# Patient Record
Sex: Male | Born: 1945 | Race: White | Hispanic: No | Marital: Married
Health system: Southern US, Academic
[De-identification: ages and names within clinical notes are randomized; demographics above are authoritative.]

## PROBLEM LIST (undated history)

## (undated) ENCOUNTER — Ambulatory Visit

## (undated) ENCOUNTER — Encounter

## (undated) ENCOUNTER — Encounter: Attending: Anatomic Pathology & Clinical Pathology | Primary: Anatomic Pathology & Clinical Pathology

## (undated) ENCOUNTER — Ambulatory Visit: Payer: MEDICARE

## (undated) ENCOUNTER — Encounter: Attending: Internal Medicine | Primary: Internal Medicine

## (undated) ENCOUNTER — Encounter: Attending: Hematology | Primary: Hematology

## (undated) ENCOUNTER — Encounter: Attending: Pharmacist | Primary: Pharmacist

## (undated) ENCOUNTER — Telehealth

## (undated) ENCOUNTER — Encounter: Attending: Nurse Practitioner | Primary: Nurse Practitioner

## (undated) ENCOUNTER — Telehealth
Attending: Student in an Organized Health Care Education/Training Program | Primary: Student in an Organized Health Care Education/Training Program

## (undated) ENCOUNTER — Telehealth: Attending: Physical Medicine & Rehabilitation | Primary: Physical Medicine & Rehabilitation

## (undated) ENCOUNTER — Encounter: Attending: Pediatrics | Primary: Pediatrics

## (undated) ENCOUNTER — Ambulatory Visit: Payer: MEDICARE | Attending: Primary Care | Primary: Primary Care

## (undated) ENCOUNTER — Ambulatory Visit: Payer: MEDICARE | Attending: Hematology | Primary: Hematology

## (undated) ENCOUNTER — Ambulatory Visit: Payer: Medicare (Managed Care)

## (undated) ENCOUNTER — Ambulatory Visit: Attending: Orthopaedic Surgery | Primary: Orthopaedic Surgery

## (undated) ENCOUNTER — Encounter: Attending: Oncology | Primary: Oncology

## (undated) ENCOUNTER — Encounter: Attending: Primary Care | Primary: Primary Care

## (undated) ENCOUNTER — Telehealth: Attending: Pediatrics | Primary: Pediatrics

## (undated) ENCOUNTER — Telehealth: Attending: Acute Care | Primary: Acute Care

## (undated) ENCOUNTER — Encounter: Attending: Podiatrist | Primary: Podiatrist

## (undated) ENCOUNTER — Telehealth: Attending: Internal Medicine | Primary: Internal Medicine

## (undated) ENCOUNTER — Encounter: Attending: Adult Health | Primary: Adult Health

## (undated) ENCOUNTER — Encounter
Attending: Student in an Organized Health Care Education/Training Program | Primary: Student in an Organized Health Care Education/Training Program

## (undated) ENCOUNTER — Encounter
Payer: MEDICARE | Attending: Adult Reconstructive Orthopaedic Surgery | Primary: Adult Reconstructive Orthopaedic Surgery

## (undated) ENCOUNTER — Encounter
Attending: Rehabilitative and Restorative Service Providers" | Primary: Rehabilitative and Restorative Service Providers"

## (undated) ENCOUNTER — Encounter: Attending: Critical Care Medicine | Primary: Critical Care Medicine

## (undated) ENCOUNTER — Ambulatory Visit: Payer: MEDICARE | Attending: Adult Health | Primary: Adult Health

## (undated) ENCOUNTER — Ambulatory Visit: Payer: MEDICARE | Attending: Internal Medicine | Primary: Internal Medicine

## (undated) ENCOUNTER — Telehealth: Attending: Oncology | Primary: Oncology

## (undated) ENCOUNTER — Encounter: Attending: Adult Reconstructive Orthopaedic Surgery | Primary: Adult Reconstructive Orthopaedic Surgery

## (undated) ENCOUNTER — Ambulatory Visit: Payer: Medicare (Managed Care) | Attending: Family | Primary: Family

## (undated) ENCOUNTER — Encounter
Payer: MEDICARE | Attending: Rehabilitative and Restorative Service Providers" | Primary: Rehabilitative and Restorative Service Providers"

## (undated) ENCOUNTER — Ambulatory Visit
Payer: Medicare (Managed Care) | Attending: Student in an Organized Health Care Education/Training Program | Primary: Student in an Organized Health Care Education/Training Program

## (undated) ENCOUNTER — Telehealth: Attending: Podiatrist | Primary: Podiatrist

## (undated) ENCOUNTER — Encounter: Attending: Infectious Disease | Primary: Infectious Disease

## (undated) ENCOUNTER — Encounter: Attending: Cardiovascular Disease | Primary: Cardiovascular Disease

## (undated) ENCOUNTER — Encounter: Payer: MEDICARE | Attending: Pediatrics | Primary: Pediatrics

## (undated) ENCOUNTER — Telehealth: Attending: Hematology & Oncology | Primary: Hematology & Oncology

## (undated) ENCOUNTER — Ambulatory Visit
Payer: MEDICARE | Attending: Rehabilitative and Restorative Service Providers" | Primary: Rehabilitative and Restorative Service Providers"

## (undated) ENCOUNTER — Encounter
Payer: MEDICARE | Attending: Student in an Organized Health Care Education/Training Program | Primary: Student in an Organized Health Care Education/Training Program

## (undated) ENCOUNTER — Ambulatory Visit
Attending: Rehabilitative and Restorative Service Providers" | Primary: Rehabilitative and Restorative Service Providers"

## (undated) ENCOUNTER — Encounter: Attending: Physical Medicine & Rehabilitation | Primary: Physical Medicine & Rehabilitation

## (undated) ENCOUNTER — Telehealth: Attending: Hematology | Primary: Hematology

## (undated) ENCOUNTER — Encounter: Payer: MEDICARE | Attending: Oncology | Primary: Oncology

## (undated) ENCOUNTER — Ambulatory Visit: Attending: Pediatrics | Primary: Pediatrics

## (undated) ENCOUNTER — Ambulatory Visit: Payer: MEDICARE | Attending: Physical Medicine & Rehabilitation | Primary: Physical Medicine & Rehabilitation

## (undated) ENCOUNTER — Ambulatory Visit: Payer: Medicare (Managed Care) | Attending: Internal Medicine | Primary: Internal Medicine

## (undated) ENCOUNTER — Encounter
Attending: Pharmacist Clinician (PhC)/ Clinical Pharmacy Specialist | Primary: Pharmacist Clinician (PhC)/ Clinical Pharmacy Specialist

## (undated) ENCOUNTER — Encounter: Attending: Family | Primary: Family

## (undated) ENCOUNTER — Ambulatory Visit: Payer: MEDICARE | Attending: Oncology | Primary: Oncology

## (undated) ENCOUNTER — Encounter: Attending: Otolaryngology | Primary: Otolaryngology

## (undated) ENCOUNTER — Ambulatory Visit: Payer: MEDICARE | Attending: Podiatrist | Primary: Podiatrist

## (undated) ENCOUNTER — Ambulatory Visit: Payer: Medicare (Managed Care) | Attending: Dermatology | Primary: Dermatology

## (undated) ENCOUNTER — Other Ambulatory Visit

## (undated) ENCOUNTER — Ambulatory Visit: Payer: MEDICARE | Attending: Cardiovascular Disease | Primary: Cardiovascular Disease

## (undated) DIAGNOSIS — C801 Malignant (primary) neoplasm, unspecified: Secondary | ICD-10-CM

## (undated) DIAGNOSIS — R7303 Prediabetes: Secondary | ICD-10-CM

## (undated) DIAGNOSIS — I10 Essential (primary) hypertension: Secondary | ICD-10-CM

## (undated) HISTORY — PX: HERNIA REPAIR: SHX51

## (undated) HISTORY — PX: OTHER SURGICAL HISTORY: SHX169

---
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
## 2008-04-27 DIAGNOSIS — K635 Polyp of colon: Secondary | ICD-10-CM | POA: Insufficient documentation

## 2012-11-27 HISTORY — PX: JOINT REPLACEMENT: SHX530

## 2015-05-18 DIAGNOSIS — R7301 Impaired fasting glucose: Secondary | ICD-10-CM | POA: Insufficient documentation

## 2015-05-18 DIAGNOSIS — I872 Venous insufficiency (chronic) (peripheral): Secondary | ICD-10-CM | POA: Insufficient documentation

## 2015-05-18 DIAGNOSIS — B002 Herpesviral gingivostomatitis and pharyngotonsillitis: Secondary | ICD-10-CM | POA: Insufficient documentation

## 2015-05-18 DIAGNOSIS — B351 Tinea unguium: Secondary | ICD-10-CM | POA: Insufficient documentation

## 2015-05-18 DIAGNOSIS — E559 Vitamin D deficiency, unspecified: Secondary | ICD-10-CM | POA: Insufficient documentation

## 2015-05-18 DIAGNOSIS — E785 Hyperlipidemia, unspecified: Secondary | ICD-10-CM | POA: Insufficient documentation

## 2015-05-18 DIAGNOSIS — E669 Obesity, unspecified: Secondary | ICD-10-CM | POA: Insufficient documentation

## 2015-07-19 DIAGNOSIS — N401 Enlarged prostate with lower urinary tract symptoms: Secondary | ICD-10-CM | POA: Insufficient documentation

## 2015-07-19 DIAGNOSIS — R350 Frequency of micturition: Secondary | ICD-10-CM | POA: Insufficient documentation

## 2015-10-13 DIAGNOSIS — Z96641 Presence of right artificial hip joint: Secondary | ICD-10-CM | POA: Insufficient documentation

## 2017-03-28 DIAGNOSIS — M25552 Pain in left hip: Secondary | ICD-10-CM | POA: Insufficient documentation

## 2017-03-28 DIAGNOSIS — R739 Hyperglycemia, unspecified: Secondary | ICD-10-CM | POA: Insufficient documentation

## 2017-03-28 DIAGNOSIS — R7303 Prediabetes: Secondary | ICD-10-CM | POA: Insufficient documentation

## 2017-05-31 ENCOUNTER — Ambulatory Visit: Admission: RE | Admit: 2017-05-31 | Discharge: 2017-05-31 | Payer: MEDICARE | Attending: Surgery | Admitting: Surgery

## 2017-05-31 DIAGNOSIS — Z8719 Personal history of other diseases of the digestive system: Secondary | ICD-10-CM

## 2017-05-31 DIAGNOSIS — Z9889 Other specified postprocedural states: Secondary | ICD-10-CM

## 2017-05-31 DIAGNOSIS — K4030 Unilateral inguinal hernia, with obstruction, without gangrene, not specified as recurrent: Principal | ICD-10-CM

## 2017-11-12 ENCOUNTER — Ambulatory Visit
Admission: RE | Admit: 2017-11-12 | Discharge: 2017-11-12 | Payer: MEDICARE | Attending: Pediatrics | Admitting: Pediatrics

## 2017-11-12 DIAGNOSIS — Z23 Encounter for immunization: Principal | ICD-10-CM

## 2017-11-12 DIAGNOSIS — D369 Benign neoplasm, unspecified site: Secondary | ICD-10-CM

## 2017-11-12 DIAGNOSIS — L821 Other seborrheic keratosis: Secondary | ICD-10-CM

## 2017-11-12 DIAGNOSIS — M545 Low back pain: Secondary | ICD-10-CM

## 2017-11-12 DIAGNOSIS — I781 Nevus, non-neoplastic: Secondary | ICD-10-CM

## 2018-01-01 ENCOUNTER — Encounter: Admit: 2018-01-01 | Discharge: 2018-01-01 | Payer: MEDICARE

## 2018-02-19 ENCOUNTER — Encounter: Admit: 2018-02-19 | Discharge: 2018-02-20 | Payer: MEDICARE | Attending: Pediatrics | Primary: Pediatrics

## 2018-02-19 DIAGNOSIS — R61 Generalized hyperhidrosis: Principal | ICD-10-CM

## 2018-02-19 DIAGNOSIS — F5101 Primary insomnia: Secondary | ICD-10-CM

## 2018-02-19 DIAGNOSIS — R634 Abnormal weight loss: Secondary | ICD-10-CM

## 2018-03-21 ENCOUNTER — Encounter: Admit: 2018-03-21 | Discharge: 2018-03-22 | Payer: MEDICARE | Attending: Pediatrics | Primary: Pediatrics

## 2018-03-21 DIAGNOSIS — R400 Somnolence: Secondary | ICD-10-CM

## 2018-03-21 DIAGNOSIS — G479 Sleep disorder, unspecified: Principal | ICD-10-CM

## 2018-03-26 ENCOUNTER — Encounter: Admit: 2018-03-26 | Discharge: 2018-03-28 | Payer: MEDICARE | Attending: Family | Primary: Family

## 2018-03-26 ENCOUNTER — Encounter: Admit: 2018-03-26 | Discharge: 2018-03-28 | Payer: MEDICARE

## 2018-03-26 DIAGNOSIS — G4730 Sleep apnea, unspecified: Secondary | ICD-10-CM

## 2018-03-26 DIAGNOSIS — G479 Sleep disorder, unspecified: Principal | ICD-10-CM

## 2018-03-28 ENCOUNTER — Ambulatory Visit: Admit: 2018-03-28 | Discharge: 2018-03-29 | Payer: MEDICARE

## 2018-03-28 DIAGNOSIS — G4700 Insomnia, unspecified: Principal | ICD-10-CM

## 2018-03-28 DIAGNOSIS — R351 Nocturia: Secondary | ICD-10-CM

## 2018-03-28 MED ORDER — TAMSULOSIN 0.4 MG CAPSULE
ORAL_CAPSULE | Freq: Every evening | ORAL | 5 refills | 0.00000 days | Status: CP
Start: 2018-03-28 — End: 2018-05-27

## 2018-03-28 MED ORDER — TRAZODONE 50 MG TABLET
ORAL_TABLET | Freq: Every evening | ORAL | 5 refills | 0 days | Status: CP | PRN
Start: 2018-03-28 — End: 2018-04-10

## 2018-04-10 ENCOUNTER — Encounter: Admit: 2018-04-10 | Discharge: 2018-04-11 | Payer: MEDICARE | Attending: Pediatrics | Primary: Pediatrics

## 2018-04-10 DIAGNOSIS — Z23 Encounter for immunization: Secondary | ICD-10-CM

## 2018-04-10 DIAGNOSIS — N401 Enlarged prostate with lower urinary tract symptoms: Secondary | ICD-10-CM

## 2018-04-10 DIAGNOSIS — N138 Other obstructive and reflux uropathy: Secondary | ICD-10-CM

## 2018-04-10 DIAGNOSIS — F5101 Primary insomnia: Principal | ICD-10-CM

## 2018-04-10 DIAGNOSIS — R7303 Prediabetes: Secondary | ICD-10-CM

## 2018-04-10 DIAGNOSIS — Z125 Encounter for screening for malignant neoplasm of prostate: Secondary | ICD-10-CM

## 2018-04-10 DIAGNOSIS — Z136 Encounter for screening for cardiovascular disorders: Secondary | ICD-10-CM

## 2018-04-10 MED ORDER — TRAZODONE 50 MG TABLET
ORAL_TABLET | Freq: Every evening | ORAL | 5 refills | 0.00000 days | PRN
Start: 2018-04-10 — End: 2018-05-27

## 2018-04-10 MED ORDER — VARICELLA-ZOSTER GLYCOE VACC-AS01B ADJ(PF) 50 MCG/0.5 ML IM SUSP, KIT
Freq: Once | INTRAMUSCULAR | 1 refills | 0.00000 days | Status: CP
Start: 2018-04-10 — End: 2018-04-10

## 2018-04-18 ENCOUNTER — Encounter
Admit: 2018-04-18 | Discharge: 2018-04-19 | Payer: MEDICARE | Attending: Rehabilitative and Restorative Service Providers" | Primary: Rehabilitative and Restorative Service Providers"

## 2018-04-18 DIAGNOSIS — R269 Unspecified abnormalities of gait and mobility: Secondary | ICD-10-CM

## 2018-04-18 DIAGNOSIS — R29898 Other symptoms and signs involving the musculoskeletal system: Secondary | ICD-10-CM

## 2018-04-18 DIAGNOSIS — R2689 Other abnormalities of gait and mobility: Secondary | ICD-10-CM

## 2018-04-18 DIAGNOSIS — M25561 Pain in right knee: Principal | ICD-10-CM

## 2018-04-19 ENCOUNTER — Encounter: Admit: 2018-04-19 | Discharge: 2018-04-20 | Payer: MEDICARE | Attending: Family | Primary: Family

## 2018-04-19 DIAGNOSIS — G478 Other sleep disorders: Secondary | ICD-10-CM

## 2018-04-19 DIAGNOSIS — G479 Sleep disorder, unspecified: Secondary | ICD-10-CM

## 2018-04-19 DIAGNOSIS — G4761 Periodic limb movement disorder: Principal | ICD-10-CM

## 2018-04-23 ENCOUNTER — Encounter
Admit: 2018-04-23 | Discharge: 2018-04-24 | Payer: MEDICARE | Attending: Rehabilitative and Restorative Service Providers" | Primary: Rehabilitative and Restorative Service Providers"

## 2018-04-23 DIAGNOSIS — R29898 Other symptoms and signs involving the musculoskeletal system: Secondary | ICD-10-CM

## 2018-04-23 DIAGNOSIS — R2689 Other abnormalities of gait and mobility: Secondary | ICD-10-CM

## 2018-04-23 DIAGNOSIS — R269 Unspecified abnormalities of gait and mobility: Secondary | ICD-10-CM

## 2018-04-23 DIAGNOSIS — M25561 Pain in right knee: Principal | ICD-10-CM

## 2018-04-26 ENCOUNTER — Encounter
Admit: 2018-04-26 | Discharge: 2018-04-27 | Payer: MEDICARE | Attending: Rehabilitative and Restorative Service Providers" | Primary: Rehabilitative and Restorative Service Providers"

## 2018-04-26 DIAGNOSIS — R269 Unspecified abnormalities of gait and mobility: Secondary | ICD-10-CM

## 2018-04-26 DIAGNOSIS — R29898 Other symptoms and signs involving the musculoskeletal system: Secondary | ICD-10-CM

## 2018-04-26 DIAGNOSIS — R2689 Other abnormalities of gait and mobility: Secondary | ICD-10-CM

## 2018-04-26 DIAGNOSIS — M25561 Pain in right knee: Principal | ICD-10-CM

## 2018-04-29 ENCOUNTER — Encounter
Admit: 2018-04-29 | Discharge: 2018-04-30 | Payer: MEDICARE | Attending: Rehabilitative and Restorative Service Providers" | Primary: Rehabilitative and Restorative Service Providers"

## 2018-04-29 DIAGNOSIS — R29898 Other symptoms and signs involving the musculoskeletal system: Secondary | ICD-10-CM

## 2018-04-29 DIAGNOSIS — M25561 Pain in right knee: Principal | ICD-10-CM

## 2018-04-29 DIAGNOSIS — R2689 Other abnormalities of gait and mobility: Secondary | ICD-10-CM

## 2018-04-29 DIAGNOSIS — R269 Unspecified abnormalities of gait and mobility: Secondary | ICD-10-CM

## 2018-05-06 ENCOUNTER — Encounter
Admit: 2018-05-06 | Discharge: 2018-05-07 | Payer: MEDICARE | Attending: Rehabilitative and Restorative Service Providers" | Primary: Rehabilitative and Restorative Service Providers"

## 2018-05-06 DIAGNOSIS — R29898 Other symptoms and signs involving the musculoskeletal system: Secondary | ICD-10-CM

## 2018-05-06 DIAGNOSIS — R269 Unspecified abnormalities of gait and mobility: Secondary | ICD-10-CM

## 2018-05-06 DIAGNOSIS — M25561 Pain in right knee: Principal | ICD-10-CM

## 2018-05-06 DIAGNOSIS — R2689 Other abnormalities of gait and mobility: Secondary | ICD-10-CM

## 2018-05-13 ENCOUNTER — Ambulatory Visit
Admit: 2018-05-13 | Discharge: 2018-05-14 | Payer: MEDICARE | Attending: Rehabilitative and Restorative Service Providers" | Primary: Rehabilitative and Restorative Service Providers"

## 2018-05-13 DIAGNOSIS — R269 Unspecified abnormalities of gait and mobility: Secondary | ICD-10-CM

## 2018-05-13 DIAGNOSIS — R2689 Other abnormalities of gait and mobility: Secondary | ICD-10-CM

## 2018-05-13 DIAGNOSIS — M25561 Pain in right knee: Principal | ICD-10-CM

## 2018-05-13 DIAGNOSIS — R29898 Other symptoms and signs involving the musculoskeletal system: Secondary | ICD-10-CM

## 2018-05-20 ENCOUNTER — Encounter
Admit: 2018-05-20 | Discharge: 2018-05-21 | Payer: MEDICARE | Attending: Rehabilitative and Restorative Service Providers" | Primary: Rehabilitative and Restorative Service Providers"

## 2018-05-20 DIAGNOSIS — R2689 Other abnormalities of gait and mobility: Secondary | ICD-10-CM

## 2018-05-20 DIAGNOSIS — R29898 Other symptoms and signs involving the musculoskeletal system: Secondary | ICD-10-CM

## 2018-05-20 DIAGNOSIS — R269 Unspecified abnormalities of gait and mobility: Secondary | ICD-10-CM

## 2018-05-20 DIAGNOSIS — M25561 Pain in right knee: Principal | ICD-10-CM

## 2018-05-27 ENCOUNTER — Ambulatory Visit
Admit: 2018-05-27 | Discharge: 2018-05-28 | Payer: MEDICARE | Attending: Rehabilitative and Restorative Service Providers" | Primary: Rehabilitative and Restorative Service Providers"

## 2018-05-27 ENCOUNTER — Encounter: Admit: 2018-05-27 | Discharge: 2018-05-28 | Payer: MEDICARE

## 2018-05-27 DIAGNOSIS — R351 Nocturia: Secondary | ICD-10-CM

## 2018-05-27 DIAGNOSIS — R399 Unspecified symptoms and signs involving the genitourinary system: Secondary | ICD-10-CM

## 2018-05-27 DIAGNOSIS — R2689 Other abnormalities of gait and mobility: Secondary | ICD-10-CM

## 2018-05-27 DIAGNOSIS — M25561 Pain in right knee: Principal | ICD-10-CM

## 2018-05-27 DIAGNOSIS — H9319 Tinnitus, unspecified ear: Principal | ICD-10-CM

## 2018-05-27 DIAGNOSIS — R269 Unspecified abnormalities of gait and mobility: Secondary | ICD-10-CM

## 2018-05-27 DIAGNOSIS — R29898 Other symptoms and signs involving the musculoskeletal system: Secondary | ICD-10-CM

## 2018-05-27 DIAGNOSIS — H9193 Unspecified hearing loss, bilateral: Secondary | ICD-10-CM

## 2018-06-03 ENCOUNTER — Encounter
Admit: 2018-06-03 | Discharge: 2018-06-04 | Payer: MEDICARE | Attending: Rehabilitative and Restorative Service Providers" | Primary: Rehabilitative and Restorative Service Providers"

## 2018-06-03 DIAGNOSIS — M25561 Pain in right knee: Principal | ICD-10-CM

## 2018-06-03 DIAGNOSIS — R2689 Other abnormalities of gait and mobility: Secondary | ICD-10-CM

## 2018-06-03 DIAGNOSIS — R29898 Other symptoms and signs involving the musculoskeletal system: Secondary | ICD-10-CM

## 2018-06-03 DIAGNOSIS — R269 Unspecified abnormalities of gait and mobility: Secondary | ICD-10-CM

## 2018-06-04 ENCOUNTER — Encounter: Admit: 2018-06-04 | Discharge: 2018-06-05 | Payer: MEDICARE | Attending: Pediatrics | Primary: Pediatrics

## 2018-06-04 DIAGNOSIS — F5101 Primary insomnia: Principal | ICD-10-CM

## 2018-06-04 DIAGNOSIS — R42 Dizziness and giddiness: Secondary | ICD-10-CM

## 2018-06-04 DIAGNOSIS — H9313 Tinnitus, bilateral: Secondary | ICD-10-CM

## 2018-06-07 ENCOUNTER — Encounter: Admit: 2018-06-07 | Discharge: 2018-07-06 | Payer: MEDICARE | Attending: Audiologist | Primary: Audiologist

## 2018-06-07 ENCOUNTER — Ambulatory Visit: Admit: 2018-06-07 | Discharge: 2018-07-06 | Payer: MEDICARE | Attending: Audiologist | Primary: Audiologist

## 2018-06-07 DIAGNOSIS — H903 Sensorineural hearing loss, bilateral: Principal | ICD-10-CM

## 2018-06-12 ENCOUNTER — Encounter
Admit: 2018-06-12 | Discharge: 2018-06-13 | Payer: MEDICARE | Attending: Rehabilitative and Restorative Service Providers" | Primary: Rehabilitative and Restorative Service Providers"

## 2018-06-12 DIAGNOSIS — M25561 Pain in right knee: Principal | ICD-10-CM

## 2018-06-12 DIAGNOSIS — R2689 Other abnormalities of gait and mobility: Secondary | ICD-10-CM

## 2018-06-12 DIAGNOSIS — R269 Unspecified abnormalities of gait and mobility: Secondary | ICD-10-CM

## 2018-06-12 DIAGNOSIS — R29898 Other symptoms and signs involving the musculoskeletal system: Secondary | ICD-10-CM

## 2018-06-21 DIAGNOSIS — R42 Dizziness and giddiness: Principal | ICD-10-CM

## 2018-07-05 ENCOUNTER — Ambulatory Visit: Admit: 2018-07-05 | Discharge: 2018-07-06 | Payer: MEDICARE | Attending: Otolaryngology | Primary: Otolaryngology

## 2018-07-05 ENCOUNTER — Encounter: Admit: 2018-07-05 | Discharge: 2018-07-06 | Payer: MEDICARE

## 2018-07-05 DIAGNOSIS — H9313 Tinnitus, bilateral: Principal | ICD-10-CM

## 2018-07-05 DIAGNOSIS — R42 Dizziness and giddiness: Secondary | ICD-10-CM

## 2018-07-08 ENCOUNTER — Encounter
Admit: 2018-07-08 | Discharge: 2018-07-09 | Payer: MEDICARE | Attending: Rehabilitative and Restorative Service Providers" | Primary: Rehabilitative and Restorative Service Providers"

## 2018-07-08 DIAGNOSIS — M25561 Pain in right knee: Principal | ICD-10-CM

## 2018-07-08 DIAGNOSIS — R269 Unspecified abnormalities of gait and mobility: Secondary | ICD-10-CM

## 2018-07-08 DIAGNOSIS — R2689 Other abnormalities of gait and mobility: Secondary | ICD-10-CM

## 2018-07-08 DIAGNOSIS — R29898 Other symptoms and signs involving the musculoskeletal system: Secondary | ICD-10-CM

## 2018-07-11 ENCOUNTER — Encounter: Admit: 2018-07-11 | Discharge: 2018-07-12 | Payer: MEDICARE

## 2018-07-11 DIAGNOSIS — H9313 Tinnitus, bilateral: Principal | ICD-10-CM

## 2018-07-25 ENCOUNTER — Encounter: Admit: 2018-07-25 | Discharge: 2018-07-26 | Payer: MEDICARE

## 2018-07-25 ENCOUNTER — Encounter: Admit: 2018-07-25 | Discharge: 2018-07-26 | Payer: MEDICARE | Attending: Pediatrics | Primary: Pediatrics

## 2018-07-25 DIAGNOSIS — M25551 Pain in right hip: Secondary | ICD-10-CM

## 2018-07-25 DIAGNOSIS — L308 Other specified dermatitis: Principal | ICD-10-CM

## 2018-07-25 MED ORDER — TRIAMCINOLONE ACETONIDE 0.1 % TOPICAL CREAM
Freq: Two times a day (BID) | TOPICAL | 0 refills | 0.00000 days | Status: CP
Start: 2018-07-25 — End: 2019-01-06

## 2018-08-07 ENCOUNTER — Ambulatory Visit: Admit: 2018-08-07 | Discharge: 2018-08-08 | Payer: MEDICARE

## 2018-08-07 ENCOUNTER — Encounter
Admit: 2018-08-07 | Discharge: 2018-08-08 | Payer: MEDICARE | Attending: Adult Reconstructive Orthopaedic Surgery | Primary: Adult Reconstructive Orthopaedic Surgery

## 2018-08-07 ENCOUNTER — Encounter: Admit: 2018-08-07 | Discharge: 2018-08-08 | Payer: MEDICARE

## 2018-08-07 DIAGNOSIS — Z96641 Presence of right artificial hip joint: Principal | ICD-10-CM

## 2018-08-07 DIAGNOSIS — R29898 Other symptoms and signs involving the musculoskeletal system: Secondary | ICD-10-CM

## 2018-09-09 ENCOUNTER — Encounter
Admit: 2018-09-09 | Discharge: 2018-09-10 | Payer: MEDICARE | Attending: Emergency Medicine | Primary: Emergency Medicine

## 2018-09-09 DIAGNOSIS — M25551 Pain in right hip: Principal | ICD-10-CM

## 2018-09-09 DIAGNOSIS — Z96641 Presence of right artificial hip joint: Secondary | ICD-10-CM

## 2018-09-09 DIAGNOSIS — R29898 Other symptoms and signs involving the musculoskeletal system: Secondary | ICD-10-CM

## 2018-10-11 ENCOUNTER — Encounter: Admit: 2018-10-11 | Discharge: 2018-10-12 | Payer: MEDICARE | Attending: Pediatrics | Primary: Pediatrics

## 2018-10-11 DIAGNOSIS — R350 Frequency of micturition: Secondary | ICD-10-CM

## 2018-10-11 DIAGNOSIS — R739 Hyperglycemia, unspecified: Secondary | ICD-10-CM

## 2018-10-11 DIAGNOSIS — M25551 Pain in right hip: Principal | ICD-10-CM

## 2018-10-11 DIAGNOSIS — N401 Enlarged prostate with lower urinary tract symptoms: Secondary | ICD-10-CM

## 2018-10-11 MED ORDER — TAMSULOSIN 0.4 MG CAPSULE
ORAL_CAPSULE | Freq: Every day | ORAL | 3 refills | 0 days | Status: SS
Start: 2018-10-11 — End: 2019-06-12

## 2018-11-27 DIAGNOSIS — N401 Enlarged prostate with lower urinary tract symptoms: Secondary | ICD-10-CM | POA: Insufficient documentation

## 2018-12-25 ENCOUNTER — Encounter: Admit: 2018-12-25 | Discharge: 2018-12-25 | Payer: MEDICARE

## 2018-12-25 ENCOUNTER — Encounter
Admit: 2018-12-25 | Discharge: 2018-12-25 | Payer: MEDICARE | Attending: Adult Reconstructive Orthopaedic Surgery | Primary: Adult Reconstructive Orthopaedic Surgery

## 2018-12-25 DIAGNOSIS — M25551 Pain in right hip: Principal | ICD-10-CM

## 2018-12-28 ENCOUNTER — Encounter: Admit: 2018-12-28 | Discharge: 2018-12-29 | Payer: MEDICARE

## 2018-12-28 DIAGNOSIS — M25551 Pain in right hip: Principal | ICD-10-CM

## 2019-01-03 ENCOUNTER — Encounter: Admit: 2019-01-03 | Discharge: 2019-01-04 | Payer: MEDICARE

## 2019-01-03 DIAGNOSIS — M898X9 Other specified disorders of bone, unspecified site: Principal | ICD-10-CM

## 2019-01-03 DIAGNOSIS — R972 Elevated prostate specific antigen [PSA]: Secondary | ICD-10-CM

## 2019-01-06 ENCOUNTER — Encounter: Admit: 2019-01-06 | Discharge: 2019-01-07 | Payer: MEDICARE | Attending: Pediatrics | Primary: Pediatrics

## 2019-01-06 DIAGNOSIS — M25551 Pain in right hip: Principal | ICD-10-CM

## 2019-01-06 DIAGNOSIS — R936 Abnormal findings on diagnostic imaging of limbs: Secondary | ICD-10-CM

## 2019-01-06 MED ORDER — CLOBETASOL 0.05 % TOPICAL CREAM
Freq: Two times a day (BID) | TOPICAL | 0 refills | 0 days | Status: CP
Start: 2019-01-06 — End: 2019-04-15

## 2019-01-08 ENCOUNTER — Encounter: Admit: 2019-01-08 | Discharge: 2019-01-21 | Payer: MEDICARE

## 2019-01-08 ENCOUNTER — Ambulatory Visit: Admit: 2019-01-08 | Discharge: 2019-02-06 | Payer: MEDICARE

## 2019-01-08 DIAGNOSIS — M25551 Pain in right hip: Principal | ICD-10-CM

## 2019-01-10 ENCOUNTER — Encounter: Admit: 2019-01-10 | Discharge: 2019-01-11 | Payer: MEDICARE

## 2019-01-10 DIAGNOSIS — D472 Monoclonal gammopathy: Principal | ICD-10-CM

## 2019-01-21 ENCOUNTER — Encounter: Admit: 2019-01-21 | Discharge: 2019-01-22 | Payer: MEDICARE

## 2019-01-21 ENCOUNTER — Other Ambulatory Visit: Admit: 2019-01-21 | Discharge: 2019-01-22 | Payer: MEDICARE

## 2019-01-21 DIAGNOSIS — D472 Monoclonal gammopathy: Principal | ICD-10-CM

## 2019-01-21 DIAGNOSIS — C9000 Multiple myeloma not having achieved remission: Secondary | ICD-10-CM

## 2019-01-27 ENCOUNTER — Encounter
Admit: 2019-01-27 | Discharge: 2019-02-25 | Payer: MEDICARE | Attending: Radiation Oncology | Primary: Radiation Oncology

## 2019-01-27 ENCOUNTER — Ambulatory Visit
Admit: 2019-01-27 | Discharge: 2019-02-25 | Payer: MEDICARE | Attending: Radiation Oncology | Primary: Radiation Oncology

## 2019-01-27 ENCOUNTER — Encounter: Admit: 2019-01-27 | Discharge: 2019-02-25 | Payer: MEDICARE

## 2019-01-27 ENCOUNTER — Ambulatory Visit: Admit: 2019-01-27 | Discharge: 2019-02-25 | Payer: MEDICARE

## 2019-01-27 ENCOUNTER — Encounter: Admit: 2019-01-27 | Discharge: 2019-01-28 | Payer: MEDICARE

## 2019-01-27 DIAGNOSIS — D369 Benign neoplasm, unspecified site: Principal | ICD-10-CM

## 2019-01-27 DIAGNOSIS — C9000 Multiple myeloma not having achieved remission: Principal | ICD-10-CM

## 2019-01-27 DIAGNOSIS — Z510 Encounter for antineoplastic radiation therapy: Principal | ICD-10-CM

## 2019-01-27 DIAGNOSIS — M1611 Unilateral primary osteoarthritis, right hip: Principal | ICD-10-CM

## 2019-01-27 DIAGNOSIS — Z96641 Presence of right artificial hip joint: Principal | ICD-10-CM

## 2019-01-28 ENCOUNTER — Encounter: Admit: 2019-01-28 | Discharge: 2019-01-29 | Payer: MEDICARE

## 2019-01-28 DIAGNOSIS — C9000 Multiple myeloma not having achieved remission: Principal | ICD-10-CM

## 2019-01-30 DIAGNOSIS — M899 Disorder of bone, unspecified: Principal | ICD-10-CM

## 2019-01-30 DIAGNOSIS — C9000 Multiple myeloma not having achieved remission: Principal | ICD-10-CM

## 2019-01-30 DIAGNOSIS — Z96641 Presence of right artificial hip joint: Principal | ICD-10-CM

## 2019-02-05 ENCOUNTER — Ambulatory Visit: Admit: 2019-02-05 | Discharge: 2019-02-05 | Payer: MEDICARE

## 2019-02-05 ENCOUNTER — Encounter: Admit: 2019-02-05 | Discharge: 2019-02-05 | Payer: MEDICARE | Attending: Oncology | Primary: Oncology

## 2019-02-05 ENCOUNTER — Encounter: Admit: 2019-02-05 | Discharge: 2019-02-05 | Payer: MEDICARE

## 2019-02-05 DIAGNOSIS — M899 Disorder of bone, unspecified: Principal | ICD-10-CM

## 2019-02-05 DIAGNOSIS — T07XXXA Unspecified multiple injuries, initial encounter: Principal | ICD-10-CM

## 2019-02-05 DIAGNOSIS — C9000 Multiple myeloma not having achieved remission: Principal | ICD-10-CM

## 2019-02-05 DIAGNOSIS — D369 Benign neoplasm, unspecified site: Principal | ICD-10-CM

## 2019-02-05 DIAGNOSIS — E7439 Other disorders of intestinal carbohydrate absorption: Principal | ICD-10-CM

## 2019-02-07 DIAGNOSIS — T07XXXA Unspecified multiple injuries, initial encounter: Principal | ICD-10-CM

## 2019-02-07 DIAGNOSIS — M899 Disorder of bone, unspecified: Principal | ICD-10-CM

## 2019-02-07 DIAGNOSIS — C9000 Multiple myeloma not having achieved remission: Principal | ICD-10-CM

## 2019-02-07 DIAGNOSIS — M1611 Unilateral primary osteoarthritis, right hip: Principal | ICD-10-CM

## 2019-02-07 DIAGNOSIS — E7439 Other disorders of intestinal carbohydrate absorption: Principal | ICD-10-CM

## 2019-02-07 DIAGNOSIS — Z96641 Presence of right artificial hip joint: Principal | ICD-10-CM

## 2019-02-07 DIAGNOSIS — Z510 Encounter for antineoplastic radiation therapy: Principal | ICD-10-CM

## 2019-02-10 DIAGNOSIS — T07XXXA Unspecified multiple injuries, initial encounter: Principal | ICD-10-CM

## 2019-02-10 DIAGNOSIS — E7439 Other disorders of intestinal carbohydrate absorption: Principal | ICD-10-CM

## 2019-02-11 DIAGNOSIS — C9000 Multiple myeloma not having achieved remission: Principal | ICD-10-CM

## 2019-02-11 MED ORDER — LENALIDOMIDE 25 MG CAPSULE
ORAL_CAPSULE | 0 refills | 0 days | Status: CP
Start: 2019-02-11 — End: 2019-02-27

## 2019-02-12 DIAGNOSIS — Z510 Encounter for antineoplastic radiation therapy: Principal | ICD-10-CM

## 2019-02-12 DIAGNOSIS — Z96641 Presence of right artificial hip joint: Principal | ICD-10-CM

## 2019-02-12 DIAGNOSIS — C9000 Multiple myeloma not having achieved remission: Principal | ICD-10-CM

## 2019-02-12 DIAGNOSIS — M1611 Unilateral primary osteoarthritis, right hip: Principal | ICD-10-CM

## 2019-02-12 DIAGNOSIS — C9001 Multiple myeloma in remission: Secondary | ICD-10-CM | POA: Insufficient documentation

## 2019-02-13 ENCOUNTER — Ambulatory Visit: Admit: 2019-02-13 | Discharge: 2019-02-14 | Attending: Radiation Oncology | Primary: Radiation Oncology

## 2019-02-17 DIAGNOSIS — C9000 Multiple myeloma not having achieved remission: Principal | ICD-10-CM

## 2019-02-17 MED ORDER — VALACYCLOVIR 500 MG TABLET
ORAL_TABLET | Freq: Every day | ORAL | 5 refills | 0.00000 days | Status: SS
Start: 2019-02-17 — End: 2019-06-12

## 2019-02-17 MED ORDER — ASPIRIN 81 MG CHEWABLE TABLET
ORAL_TABLET | Freq: Every day | ORAL | prn refills | 0 days | Status: CP
Start: 2019-02-17 — End: 2019-05-21

## 2019-02-17 MED ORDER — PROCHLORPERAZINE MALEATE 10 MG TABLET
ORAL_TABLET | Freq: Four times a day (QID) | ORAL | 2 refills | 0 days | Status: SS | PRN
Start: 2019-02-17 — End: 2019-06-12

## 2019-02-18 DIAGNOSIS — C9000 Multiple myeloma not having achieved remission: Principal | ICD-10-CM

## 2019-02-19 ENCOUNTER — Encounter: Admit: 2019-02-19 | Discharge: 2019-02-20 | Payer: MEDICARE

## 2019-02-19 DIAGNOSIS — C9000 Multiple myeloma not having achieved remission: Principal | ICD-10-CM

## 2019-02-19 DIAGNOSIS — Z5111 Encounter for antineoplastic chemotherapy: Principal | ICD-10-CM

## 2019-02-26 ENCOUNTER — Encounter: Admit: 2019-02-26 | Discharge: 2019-02-26 | Payer: MEDICARE

## 2019-02-26 ENCOUNTER — Encounter: Admit: 2019-02-26 | Discharge: 2019-02-26 | Payer: MEDICARE | Attending: Oncology | Primary: Oncology

## 2019-02-26 DIAGNOSIS — C9000 Multiple myeloma not having achieved remission: Principal | ICD-10-CM

## 2019-02-27 MED ORDER — LENALIDOMIDE 25 MG CAPSULE
ORAL_CAPSULE | 0 refills | 0 days | Status: CP
Start: 2019-02-27 — End: 2019-03-11

## 2019-03-05 ENCOUNTER — Encounter: Admit: 2019-03-05 | Discharge: 2019-03-06 | Payer: MEDICARE

## 2019-03-05 DIAGNOSIS — C9000 Multiple myeloma not having achieved remission: Principal | ICD-10-CM

## 2019-03-11 ENCOUNTER — Telehealth: Admit: 2019-03-11 | Discharge: 2019-03-12 | Payer: MEDICARE

## 2019-03-11 DIAGNOSIS — C9001 Multiple myeloma in remission: Principal | ICD-10-CM

## 2019-03-12 ENCOUNTER — Encounter: Admit: 2019-03-12 | Discharge: 2019-03-13 | Payer: MEDICARE

## 2019-03-12 DIAGNOSIS — C9000 Multiple myeloma not having achieved remission: Principal | ICD-10-CM

## 2019-03-13 MED ORDER — LENALIDOMIDE 25 MG CAPSULE
ORAL_CAPSULE | 0 refills | 0 days
Start: 2019-03-13 — End: 2019-03-27

## 2019-03-19 ENCOUNTER — Ambulatory Visit: Admit: 2019-03-19 | Discharge: 2019-03-19 | Payer: MEDICARE

## 2019-03-19 ENCOUNTER — Encounter: Admit: 2019-03-19 | Discharge: 2019-03-19 | Payer: MEDICARE

## 2019-03-19 DIAGNOSIS — C9000 Multiple myeloma not having achieved remission: Principal | ICD-10-CM

## 2019-03-26 ENCOUNTER — Encounter: Admit: 2019-03-26 | Discharge: 2019-03-26 | Payer: MEDICARE

## 2019-03-26 DIAGNOSIS — C9000 Multiple myeloma not having achieved remission: Principal | ICD-10-CM

## 2019-03-27 MED ORDER — LENALIDOMIDE 25 MG CAPSULE
ORAL_CAPSULE | 0 refills | 0 days | Status: CP
Start: 2019-03-27 — End: 2019-04-07

## 2019-04-01 ENCOUNTER — Encounter: Admit: 2019-04-01 | Discharge: 2019-04-02 | Payer: MEDICARE

## 2019-04-01 DIAGNOSIS — C9001 Multiple myeloma in remission: Principal | ICD-10-CM

## 2019-04-02 ENCOUNTER — Encounter: Admit: 2019-04-02 | Discharge: 2019-04-02 | Payer: MEDICARE

## 2019-04-02 DIAGNOSIS — C9000 Multiple myeloma not having achieved remission: Principal | ICD-10-CM

## 2019-04-07 MED ORDER — LENALIDOMIDE 25 MG CAPSULE
ORAL_CAPSULE | 0 refills | 0 days | Status: CP
Start: 2019-04-07 — End: 2019-05-21

## 2019-04-09 ENCOUNTER — Encounter: Admit: 2019-04-09 | Discharge: 2019-04-09 | Payer: MEDICARE

## 2019-04-09 DIAGNOSIS — C9000 Multiple myeloma not having achieved remission: Principal | ICD-10-CM

## 2019-04-14 ENCOUNTER — Encounter: Admit: 2019-04-14 | Discharge: 2019-04-15 | Payer: MEDICARE | Attending: Internal Medicine | Primary: Internal Medicine

## 2019-04-14 DIAGNOSIS — C9000 Multiple myeloma not having achieved remission: Principal | ICD-10-CM

## 2019-04-14 DIAGNOSIS — C9001 Multiple myeloma in remission: Secondary | ICD-10-CM

## 2019-04-15 MED ORDER — CLOBETASOL 0.05 % TOPICAL CREAM
Freq: Every day | TOPICAL | 2 refills | 0 days | Status: CP
Start: 2019-04-15 — End: 2020-04-14

## 2019-04-16 ENCOUNTER — Encounter: Admit: 2019-04-16 | Discharge: 2019-04-16 | Payer: MEDICARE

## 2019-04-16 ENCOUNTER — Other Ambulatory Visit: Admit: 2019-04-16 | Discharge: 2019-04-16 | Payer: MEDICARE

## 2019-04-16 DIAGNOSIS — C9000 Multiple myeloma not having achieved remission: Principal | ICD-10-CM

## 2019-04-22 ENCOUNTER — Encounter: Admit: 2019-04-22 | Discharge: 2019-04-23 | Payer: MEDICARE

## 2019-04-22 DIAGNOSIS — M899 Disorder of bone, unspecified: Secondary | ICD-10-CM

## 2019-04-22 DIAGNOSIS — M25551 Pain in right hip: Principal | ICD-10-CM

## 2019-04-22 DIAGNOSIS — C9001 Multiple myeloma in remission: Principal | ICD-10-CM

## 2019-04-22 DIAGNOSIS — C9000 Multiple myeloma not having achieved remission: Secondary | ICD-10-CM

## 2019-04-22 DIAGNOSIS — Z96641 Presence of right artificial hip joint: Secondary | ICD-10-CM

## 2019-04-23 ENCOUNTER — Encounter: Admit: 2019-04-23 | Discharge: 2019-04-23 | Payer: MEDICARE

## 2019-04-23 DIAGNOSIS — C9000 Multiple myeloma not having achieved remission: Principal | ICD-10-CM

## 2019-04-30 ENCOUNTER — Encounter: Admit: 2019-04-30 | Discharge: 2019-04-30 | Payer: MEDICARE

## 2019-04-30 DIAGNOSIS — C9000 Multiple myeloma not having achieved remission: Principal | ICD-10-CM

## 2019-05-06 MED ORDER — NEUPOGEN 300 MCG/0.5 ML INJECTION SYRINGE: 300 ug | mL | Freq: Every day | 0 refills | 0 days | Status: AC

## 2019-05-06 MED ORDER — NEUPOGEN 300 MCG/0.5 ML INJECTION SYRINGE
Freq: Every day | SUBCUTANEOUS | 0 refills | 0.00000 days | Status: CP
Start: 2019-05-06 — End: 2019-05-21

## 2019-05-06 MED ORDER — GRANIX 300 MCG/0.5 ML SUBCUTANEOUS SYRINGE
Freq: Every day | SUBCUTANEOUS | 0 refills | 0 days
Start: 2019-05-06 — End: 2019-05-06

## 2019-05-06 MED ORDER — ZARXIO 300 MCG/0.5 ML INJECTION SYRINGE
Freq: Every day | SUBCUTANEOUS | 0 refills | 0.00000 days
Start: 2019-05-06 — End: 2019-05-06

## 2019-05-07 ENCOUNTER — Other Ambulatory Visit: Admit: 2019-05-07 | Discharge: 2019-05-07 | Payer: MEDICARE

## 2019-05-07 ENCOUNTER — Encounter: Admit: 2019-05-07 | Discharge: 2019-05-07 | Payer: MEDICARE

## 2019-05-07 DIAGNOSIS — C9000 Multiple myeloma not having achieved remission: Principal | ICD-10-CM

## 2019-05-12 ENCOUNTER — Encounter: Admit: 2019-05-12 | Discharge: 2019-05-12 | Payer: MEDICARE

## 2019-05-12 ENCOUNTER — Ambulatory Visit: Admit: 2019-05-12 | Discharge: 2019-05-12 | Payer: MEDICARE

## 2019-05-12 DIAGNOSIS — Z7682 Awaiting organ transplant status: Principal | ICD-10-CM

## 2019-05-13 ENCOUNTER — Ambulatory Visit: Admit: 2019-05-13 | Discharge: 2019-05-14 | Payer: MEDICARE

## 2019-05-13 ENCOUNTER — Encounter: Admit: 2019-05-13 | Discharge: 2019-05-14 | Payer: MEDICARE

## 2019-05-13 ENCOUNTER — Encounter: Admit: 2019-05-13 | Discharge: 2019-05-14 | Payer: MEDICARE | Attending: Primary Care | Primary: Primary Care

## 2019-05-13 DIAGNOSIS — Z7682 Awaiting organ transplant status: Principal | ICD-10-CM

## 2019-05-13 DIAGNOSIS — Z7982 Long term (current) use of aspirin: Secondary | ICD-10-CM

## 2019-05-13 DIAGNOSIS — C9000 Multiple myeloma not having achieved remission: Principal | ICD-10-CM

## 2019-05-20 ENCOUNTER — Ambulatory Visit: Admit: 2019-05-20 | Discharge: 2019-05-21 | Payer: MEDICARE | Attending: Internal Medicine | Primary: Internal Medicine

## 2019-05-20 DIAGNOSIS — C9001 Multiple myeloma in remission: Secondary | ICD-10-CM

## 2019-05-20 DIAGNOSIS — Z52011 Autologous donor, stem cells: Principal | ICD-10-CM

## 2019-05-21 ENCOUNTER — Encounter: Admit: 2019-05-21 | Discharge: 2019-05-21 | Payer: MEDICARE

## 2019-05-21 ENCOUNTER — Encounter
Admit: 2019-05-21 | Discharge: 2019-05-21 | Payer: MEDICARE | Attending: Pharmacist Clinician (PhC)/ Clinical Pharmacy Specialist | Primary: Pharmacist Clinician (PhC)/ Clinical Pharmacy Specialist

## 2019-05-21 ENCOUNTER — Other Ambulatory Visit: Admit: 2019-05-21 | Discharge: 2019-05-21 | Payer: MEDICARE

## 2019-05-21 ENCOUNTER — Encounter: Admit: 2019-05-21 | Discharge: 2019-05-21 | Payer: MEDICARE | Attending: Registered" | Primary: Registered"

## 2019-05-21 DIAGNOSIS — Z52011 Autologous donor, stem cells: Principal | ICD-10-CM

## 2019-05-21 DIAGNOSIS — C9000 Multiple myeloma not having achieved remission: Secondary | ICD-10-CM

## 2019-05-21 DIAGNOSIS — Z7682 Awaiting organ transplant status: Principal | ICD-10-CM

## 2019-05-21 DIAGNOSIS — Z713 Dietary counseling and surveillance: Principal | ICD-10-CM

## 2019-05-21 MED ORDER — OXYCODONE 5 MG TABLET
ORAL_TABLET | ORAL | 0 refills | 0.00000 days | Status: CP | PRN
Start: 2019-05-21 — End: ?
  Filled 2019-05-21: qty 15, 3d supply, fill #0

## 2019-05-21 MED ORDER — OMEPRAZOLE 40 MG CAPSULE,DELAYED RELEASE
Freq: Every day | ORAL | 0 refills | 0.00000 days | Status: SS | PRN
Start: 2019-05-21 — End: 2019-06-12

## 2019-05-21 MED FILL — OXYCODONE 5 MG TABLET: 3 days supply | Qty: 15 | Fill #0 | Status: AC

## 2019-05-22 ENCOUNTER — Encounter: Admit: 2019-05-22 | Discharge: 2019-05-22 | Payer: MEDICARE

## 2019-05-22 ENCOUNTER — Ambulatory Visit: Admit: 2019-05-22 | Discharge: 2019-05-22 | Payer: MEDICARE | Attending: Oncology | Primary: Oncology

## 2019-05-22 DIAGNOSIS — C9001 Multiple myeloma in remission: Principal | ICD-10-CM

## 2019-05-22 DIAGNOSIS — Z52011 Autologous donor, stem cells: Principal | ICD-10-CM

## 2019-05-22 DIAGNOSIS — C9000 Multiple myeloma not having achieved remission: Principal | ICD-10-CM

## 2019-05-23 ENCOUNTER — Institutional Professional Consult (permissible substitution): Admit: 2019-05-23 | Discharge: 2019-05-24 | Payer: MEDICARE

## 2019-05-23 DIAGNOSIS — C9000 Multiple myeloma not having achieved remission: Secondary | ICD-10-CM

## 2019-05-23 DIAGNOSIS — Z52011 Autologous donor, stem cells: Principal | ICD-10-CM

## 2019-05-24 ENCOUNTER — Encounter: Admit: 2019-05-24 | Discharge: 2019-05-25 | Payer: MEDICARE

## 2019-05-24 DIAGNOSIS — C9000 Multiple myeloma not having achieved remission: Secondary | ICD-10-CM

## 2019-05-24 DIAGNOSIS — Z52011 Autologous donor, stem cells: Principal | ICD-10-CM

## 2019-05-25 DIAGNOSIS — C9000 Multiple myeloma not having achieved remission: Secondary | ICD-10-CM

## 2019-05-25 DIAGNOSIS — Z52011 Autologous donor, stem cells: Principal | ICD-10-CM

## 2019-05-26 DIAGNOSIS — Z52011 Autologous donor, stem cells: Principal | ICD-10-CM

## 2019-05-26 DIAGNOSIS — C9000 Multiple myeloma not having achieved remission: Secondary | ICD-10-CM

## 2019-05-26 DIAGNOSIS — Z1159 Encounter for screening for other viral diseases: Principal | ICD-10-CM

## 2019-05-28 ENCOUNTER — Ambulatory Visit: Admit: 2019-05-28 | Discharge: 2019-06-12 | Disposition: A | Discharge status: Home / Self Care | Payer: MEDICARE

## 2019-05-28 ENCOUNTER — Ambulatory Visit
Admit: 2019-05-28 | Discharge: 2019-06-12 | Disposition: A | Discharge status: Home / Self Care | Payer: MEDICARE | Attending: Family

## 2019-05-28 DIAGNOSIS — C9001 Multiple myeloma in remission: Principal | ICD-10-CM

## 2019-06-03 DIAGNOSIS — Z9484 Stem cells transplant status: Secondary | ICD-10-CM | POA: Insufficient documentation

## 2019-06-12 MED ORDER — OMEPRAZOLE 40 MG CAPSULE,DELAYED RELEASE
ORAL_CAPSULE | Freq: Every day | ORAL | 2 refills | 30 days | Status: CP
Start: 2019-06-12 — End: ?
  Filled 2019-06-12: qty 30, 30d supply, fill #0

## 2019-06-12 MED ORDER — LOPERAMIDE 2 MG CAPSULE
ORAL_CAPSULE | Freq: Four times a day (QID) | ORAL | 1 refills | 8 days | Status: CP | PRN
Start: 2019-06-12 — End: 2019-07-03
  Filled 2019-06-12: qty 30, 8d supply, fill #0

## 2019-06-12 MED ORDER — CALCIUM CARBONATE 200 MG CALCIUM (500 MG) CHEWABLE TABLET
ORAL_TABLET | Freq: Three times a day (TID) | ORAL | 2 refills | 25.00000 days | Status: CP
Start: 2019-06-12 — End: 2019-07-21
  Filled 2019-06-12: qty 150, 25d supply, fill #0

## 2019-06-12 MED ORDER — TAMSULOSIN 0.4 MG CAPSULE
ORAL_CAPSULE | Freq: Every day | ORAL | 2 refills | 30 days | Status: CP
Start: 2019-06-12 — End: 2019-07-21
  Filled 2019-06-12: qty 30, 30d supply, fill #0

## 2019-06-12 MED ORDER — VALACYCLOVIR 500 MG TABLET
ORAL_TABLET | Freq: Every day | ORAL | 11 refills | 30.00000 days | Status: CP
Start: 2019-06-12 — End: 2019-07-04

## 2019-06-12 MED ORDER — PROCHLORPERAZINE MALEATE 10 MG TABLET
ORAL_TABLET | Freq: Four times a day (QID) | ORAL | 2 refills | 8 days | Status: CP | PRN
Start: 2019-06-12 — End: ?
  Filled 2019-06-12: qty 30, 8d supply, fill #0

## 2019-06-12 MED ORDER — DOCUSATE SODIUM 100 MG CAPSULE
ORAL_CAPSULE | Freq: Every evening | ORAL | 2 refills | 30 days | Status: CP | PRN
Start: 2019-06-12 — End: ?
  Filled 2019-06-12: qty 30, 30d supply, fill #0

## 2019-06-12 MED FILL — LOPERAMIDE 2 MG CAPSULE: 8 days supply | Qty: 30 | Fill #0 | Status: AC

## 2019-06-12 MED FILL — AMLODIPINE 10 MG TABLET: 30 days supply | Qty: 30 | Fill #0 | Status: AC

## 2019-06-12 MED FILL — PROCHLORPERAZINE MALEATE 10 MG TABLET: 8 days supply | Qty: 30 | Fill #0 | Status: AC

## 2019-06-12 MED FILL — SULFAMETHOXAZOLE 800 MG-TRIMETHOPRIM 160 MG TABLET: 28 days supply | Qty: 12 | Fill #0 | Status: AC

## 2019-06-12 MED FILL — ANTACID (CALCIUM CARBONATE) 200 MG CALCIUM (500 MG) CHEWABLE TABLET: 25 days supply | Qty: 150 | Fill #0 | Status: AC

## 2019-06-12 MED FILL — OMEPRAZOLE 40 MG CAPSULE,DELAYED RELEASE: 30 days supply | Qty: 30 | Fill #0 | Status: AC

## 2019-06-12 MED FILL — TAMSULOSIN 0.4 MG CAPSULE: 30 days supply | Qty: 30 | Fill #0 | Status: AC

## 2019-06-12 MED FILL — VALSARTAN 80 MG TABLET: 30 days supply | Qty: 30 | Fill #0 | Status: AC

## 2019-06-12 MED FILL — DOK 100 MG CAPSULE: 30 days supply | Qty: 30 | Fill #0 | Status: AC

## 2019-06-13 ENCOUNTER — Encounter: Admit: 2019-06-13 | Discharge: 2019-06-14 | Payer: MEDICARE | Attending: Primary Care | Primary: Primary Care

## 2019-06-13 ENCOUNTER — Encounter
Admit: 2019-06-13 | Discharge: 2019-06-14 | Payer: MEDICARE | Attending: Pharmacist Clinician (PhC)/ Clinical Pharmacy Specialist | Primary: Pharmacist Clinician (PhC)/ Clinical Pharmacy Specialist

## 2019-06-13 ENCOUNTER — Encounter: Admit: 2019-06-13 | Discharge: 2019-06-14 | Payer: MEDICARE

## 2019-06-13 DIAGNOSIS — R5383 Other fatigue: Secondary | ICD-10-CM

## 2019-06-13 DIAGNOSIS — C9001 Multiple myeloma in remission: Secondary | ICD-10-CM

## 2019-06-13 DIAGNOSIS — I10 Essential (primary) hypertension: Secondary | ICD-10-CM

## 2019-06-13 DIAGNOSIS — R197 Diarrhea, unspecified: Secondary | ICD-10-CM

## 2019-06-13 DIAGNOSIS — Z9484 Stem cells transplant status: Secondary | ICD-10-CM

## 2019-06-13 DIAGNOSIS — R000 Tachycardia, unspecified: Secondary | ICD-10-CM

## 2019-06-13 MED ORDER — VALSARTAN 80 MG TABLET
ORAL_TABLET | Freq: Every day | ORAL | 2 refills | 30 days | Status: CP
Start: 2019-06-13 — End: 2019-07-03
  Filled 2019-06-12: qty 30, 30d supply, fill #0

## 2019-06-13 MED ORDER — SULFAMETHOXAZOLE 800 MG-TRIMETHOPRIM 160 MG TABLET
ORAL_TABLET | ORAL | 1 refills | 28.00000 days | Status: CP
Start: 2019-06-13 — End: 2019-07-04
  Filled 2019-06-12: qty 12, 28d supply, fill #0

## 2019-06-13 MED ORDER — AMLODIPINE 10 MG TABLET
ORAL_TABLET | Freq: Every day | ORAL | 3 refills | 30.00000 days | Status: CP
Start: 2019-06-13 — End: 2019-07-03
  Filled 2019-06-12: qty 30, 30d supply, fill #0

## 2019-06-16 ENCOUNTER — Encounter: Admit: 2019-06-16 | Discharge: 2019-06-17 | Payer: MEDICARE | Attending: Internal Medicine | Primary: Internal Medicine

## 2019-06-16 ENCOUNTER — Other Ambulatory Visit: Admit: 2019-06-16 | Discharge: 2019-06-17 | Payer: MEDICARE

## 2019-06-16 DIAGNOSIS — C9001 Multiple myeloma in remission: Secondary | ICD-10-CM

## 2019-06-16 DIAGNOSIS — C9000 Multiple myeloma not having achieved remission: Principal | ICD-10-CM

## 2019-06-16 DIAGNOSIS — Z9481 Bone marrow transplant status: Secondary | ICD-10-CM

## 2019-06-16 DIAGNOSIS — R197 Diarrhea, unspecified: Principal | ICD-10-CM

## 2019-06-16 DIAGNOSIS — Z9484 Stem cells transplant status: Secondary | ICD-10-CM

## 2019-06-20 ENCOUNTER — Encounter: Admit: 2019-06-20 | Discharge: 2019-06-20 | Payer: MEDICARE

## 2019-06-20 DIAGNOSIS — C9001 Multiple myeloma in remission: Secondary | ICD-10-CM

## 2019-06-20 DIAGNOSIS — Z9484 Stem cells transplant status: Principal | ICD-10-CM

## 2019-06-28 ENCOUNTER — Ambulatory Visit
Admit: 2019-06-28 | Discharge: 2019-07-03 | Disposition: A | Discharge status: Home Health | Payer: MEDICARE | Admitting: Hematology & Oncology

## 2019-06-28 DIAGNOSIS — N401 Enlarged prostate with lower urinary tract symptoms: Principal | ICD-10-CM

## 2019-06-29 MED ORDER — APIXABAN 5 MG TABLET
ORAL_TABLET | Freq: Two times a day (BID) | ORAL | 5 refills | 30.00000 days
Start: 2019-06-29 — End: 2019-06-29

## 2019-06-29 MED ORDER — RIVAROXABAN 20 MG TABLET
ORAL_TABLET | Freq: Every day | ORAL | 0 refills | 30.00000 days
Start: 2019-06-29 — End: 2019-06-29

## 2019-06-30 DIAGNOSIS — N401 Enlarged prostate with lower urinary tract symptoms: Principal | ICD-10-CM

## 2019-07-01 DIAGNOSIS — B258 Other cytomegaloviral diseases: Secondary | ICD-10-CM | POA: Insufficient documentation

## 2019-07-01 DIAGNOSIS — I10 Essential (primary) hypertension: Secondary | ICD-10-CM | POA: Insufficient documentation

## 2019-07-01 DIAGNOSIS — Z7901 Long term (current) use of anticoagulants: Secondary | ICD-10-CM | POA: Insufficient documentation

## 2019-07-03 MED ORDER — METOPROLOL SUCCINATE ER 200 MG TABLET,EXTENDED RELEASE 24 HR
ORAL_TABLET | Freq: Every day | ORAL | 2 refills | 30.00000 days | Status: CP
Start: 2019-07-03 — End: 2019-07-21
  Filled 2019-07-03: qty 30, 30d supply, fill #0

## 2019-07-03 MED ORDER — DILTIAZEM CD 120 MG CAPSULE,EXTENDED RELEASE 24 HR
ORAL_CAPSULE | Freq: Every day | ORAL | 2 refills | 30 days | Status: CP
Start: 2019-07-03 — End: 2019-07-21
  Filled 2019-07-03: qty 30, 30d supply, fill #0

## 2019-07-03 MED ORDER — AMOXICILLIN 875 MG-POTASSIUM CLAVULANATE 125 MG TABLET
ORAL_TABLET | Freq: Two times a day (BID) | ORAL | 0 refills | 2 days | Status: CP
Start: 2019-07-03 — End: 2019-07-07
  Filled 2019-07-03: qty 3, 2d supply, fill #0

## 2019-07-03 MED ORDER — RIVAROXABAN 20 MG TABLET
ORAL_TABLET | Freq: Every day | ORAL | 2 refills | 30.00000 days | Status: CP
Start: 2019-07-03 — End: 2019-07-22
  Filled 2019-07-03: qty 30, 30d supply, fill #0

## 2019-07-03 MED ORDER — POTASSIUM CHLORIDE ER 20 MEQ TABLET,EXTENDED RELEASE(PART/CRYST)
ORAL_TABLET | Freq: Every day | ORAL | 2 refills | 30.00000 days | Status: CP
Start: 2019-07-03 — End: 2019-10-01
  Filled 2019-07-03: qty 30, 30d supply, fill #0

## 2019-07-03 MED ORDER — MG-PLUS-PROTEIN 133 MG TABLET
ORAL_TABLET | Freq: Three times a day (TID) | ORAL | 2 refills | 0 days | Status: CP
Start: 2019-07-03 — End: 2019-07-21
  Filled 2019-07-03: qty 100, 30d supply, fill #0

## 2019-07-03 MED FILL — POTASSIUM CHLORIDE ER 20 MEQ TABLET,EXTENDED RELEASE(PART/CRYST): 30 days supply | Qty: 30 | Fill #0 | Status: AC

## 2019-07-03 MED FILL — METOPROLOL SUCCINATE ER 200 MG TABLET,EXTENDED RELEASE 24 HR: 30 days supply | Qty: 30 | Fill #0 | Status: AC

## 2019-07-03 MED FILL — XARELTO 20 MG TABLET: 30 days supply | Qty: 30 | Fill #0 | Status: AC

## 2019-07-03 MED FILL — AMOXICILLIN 875 MG-POTASSIUM CLAVULANATE 125 MG TABLET: 2 days supply | Qty: 3 | Fill #0 | Status: AC

## 2019-07-03 MED FILL — DILTIAZEM CD 120 MG CAPSULE,EXTENDED RELEASE 24 HR: 30 days supply | Qty: 30 | Fill #0 | Status: AC

## 2019-07-03 MED FILL — MG-PLUS-PROTEIN 133 MG TABLET: 30 days supply | Qty: 100 | Fill #0 | Status: AC

## 2019-07-04 ENCOUNTER — Encounter
Admit: 2019-07-04 | Discharge: 2019-07-05 | Payer: MEDICARE | Attending: Pharmacist Clinician (PhC)/ Clinical Pharmacy Specialist | Primary: Pharmacist Clinician (PhC)/ Clinical Pharmacy Specialist

## 2019-07-04 ENCOUNTER — Encounter: Admit: 2019-07-04 | Discharge: 2019-07-05 | Payer: MEDICARE

## 2019-07-04 ENCOUNTER — Encounter
Admit: 2019-07-04 | Discharge: 2019-07-05 | Payer: MEDICARE | Attending: Nurse Practitioner | Primary: Nurse Practitioner

## 2019-07-04 DIAGNOSIS — C9001 Multiple myeloma in remission: Principal | ICD-10-CM

## 2019-07-04 DIAGNOSIS — Z9484 Stem cells transplant status: Principal | ICD-10-CM

## 2019-07-04 DIAGNOSIS — C9000 Multiple myeloma not having achieved remission: Secondary | ICD-10-CM

## 2019-07-04 MED ORDER — SULFAMETHOXAZOLE 800 MG-TRIMETHOPRIM 160 MG TABLET
ORAL_TABLET | ORAL | 1 refills | 28.00000 days | Status: CP
Start: 2019-07-04 — End: ?

## 2019-07-04 MED ORDER — OXYMETAZOLINE 0.05 % NASAL SPRAY
Freq: Two times a day (BID) | NASAL | 0 refills | 75 days
Start: 2019-07-04 — End: 2019-07-07

## 2019-07-04 MED ORDER — VALACYCLOVIR 500 MG TABLET
ORAL_TABLET | Freq: Every day | ORAL | 11 refills | 30 days | Status: CP
Start: 2019-07-04 — End: 2019-07-05

## 2019-07-05 MED ORDER — VALGANCICLOVIR 450 MG TABLET
ORAL_TABLET | Freq: Two times a day (BID) | ORAL | 0 refills | 30 days | Status: CP
Start: 2019-07-05 — End: 2019-07-21
  Filled 2019-07-05: qty 120, 30d supply, fill #0

## 2019-07-05 MED FILL — VALGANCICLOVIR 450 MG TABLET: 30 days supply | Qty: 120 | Fill #0 | Status: AC

## 2019-07-07 ENCOUNTER — Other Ambulatory Visit: Admit: 2019-07-07 | Discharge: 2019-07-08 | Payer: MEDICARE

## 2019-07-07 ENCOUNTER — Encounter: Admit: 2019-07-07 | Discharge: 2019-07-08 | Payer: MEDICARE | Attending: Primary Care | Primary: Primary Care

## 2019-07-07 DIAGNOSIS — Z9484 Stem cells transplant status: Principal | ICD-10-CM

## 2019-07-07 DIAGNOSIS — B259 Cytomegaloviral disease, unspecified: Secondary | ICD-10-CM

## 2019-07-07 DIAGNOSIS — R5383 Other fatigue: Secondary | ICD-10-CM

## 2019-07-07 DIAGNOSIS — R609 Edema, unspecified: Secondary | ICD-10-CM

## 2019-07-07 DIAGNOSIS — C9001 Multiple myeloma in remission: Principal | ICD-10-CM

## 2019-07-07 DIAGNOSIS — I4819 Other persistent atrial fibrillation: Secondary | ICD-10-CM

## 2019-07-09 ENCOUNTER — Encounter: Admit: 2019-07-09 | Discharge: 2019-07-09 | Payer: MEDICARE

## 2019-07-09 DIAGNOSIS — C9001 Multiple myeloma in remission: Principal | ICD-10-CM

## 2019-07-09 DIAGNOSIS — Z9484 Stem cells transplant status: Secondary | ICD-10-CM

## 2019-07-09 DIAGNOSIS — B259 Cytomegaloviral disease, unspecified: Secondary | ICD-10-CM

## 2019-07-14 ENCOUNTER — Encounter: Admit: 2019-07-14 | Discharge: 2019-07-15 | Payer: MEDICARE

## 2019-07-14 DIAGNOSIS — C9001 Multiple myeloma in remission: Secondary | ICD-10-CM

## 2019-07-14 DIAGNOSIS — B259 Cytomegaloviral disease, unspecified: Secondary | ICD-10-CM

## 2019-07-14 DIAGNOSIS — Z9221 Personal history of antineoplastic chemotherapy: Secondary | ICD-10-CM

## 2019-07-14 DIAGNOSIS — Z9484 Stem cells transplant status: Principal | ICD-10-CM

## 2019-07-14 DIAGNOSIS — Z79899 Other long term (current) drug therapy: Secondary | ICD-10-CM

## 2019-07-14 DIAGNOSIS — R531 Weakness: Secondary | ICD-10-CM

## 2019-07-21 ENCOUNTER — Ambulatory Visit: Admit: 2019-07-21 | Discharge: 2019-07-22 | Payer: MEDICARE | Attending: Internal Medicine | Primary: Internal Medicine

## 2019-07-21 ENCOUNTER — Encounter: Admit: 2019-07-21 | Discharge: 2019-07-22 | Payer: MEDICARE

## 2019-07-21 ENCOUNTER — Encounter: Admit: 2019-07-21 | Discharge: 2019-07-22 | Payer: MEDICARE | Attending: Primary Care | Primary: Primary Care

## 2019-07-21 DIAGNOSIS — C9001 Multiple myeloma in remission: Secondary | ICD-10-CM

## 2019-07-21 DIAGNOSIS — I4891 Unspecified atrial fibrillation: Secondary | ICD-10-CM

## 2019-07-21 DIAGNOSIS — B259 Cytomegaloviral disease, unspecified: Secondary | ICD-10-CM

## 2019-07-21 DIAGNOSIS — Z9484 Stem cells transplant status: Secondary | ICD-10-CM

## 2019-07-21 MED ORDER — TAMSULOSIN 0.4 MG CAPSULE
ORAL_CAPSULE | Freq: Every day | ORAL | 2 refills | 30 days | Status: CP
Start: 2019-07-21 — End: 2019-08-20

## 2019-07-21 MED ORDER — MG-PLUS-PROTEIN 133 MG TABLET
Freq: Two times a day (BID) | ORAL | 0 days
Start: 2019-07-21 — End: 2019-07-21

## 2019-07-21 MED ORDER — MAGNESIUM OXIDE 400 MG (241.3 MG MAGNESIUM) TABLET
ORAL_TABLET | Freq: Two times a day (BID) | ORAL | 1 refills | 30.00000 days | Status: CP
Start: 2019-07-21 — End: 2019-08-20

## 2019-07-21 MED ORDER — CALCIUM CARBONATE 200 MG CALCIUM (500 MG) CHEWABLE TABLET
ORAL_TABLET | Freq: Two times a day (BID) | ORAL | 2 refills | 38 days
Start: 2019-07-21 — End: ?

## 2019-07-21 MED ORDER — VALGANCICLOVIR 450 MG TABLET
ORAL_TABLET | Freq: Two times a day (BID) | ORAL | 0 refills | 60 days
Start: 2019-07-21 — End: ?

## 2019-07-21 MED ORDER — METOPROLOL SUCCINATE ER 200 MG TABLET,EXTENDED RELEASE 24 HR
ORAL_TABLET | Freq: Every day | ORAL | 3 refills | 30 days | Status: CP
Start: 2019-07-21 — End: 2019-08-20

## 2019-07-21 MED ORDER — DILTIAZEM CD 120 MG CAPSULE,EXTENDED RELEASE 24 HR
ORAL_CAPSULE | Freq: Every day | ORAL | 3 refills | 30 days | Status: CP
Start: 2019-07-21 — End: 2019-08-20

## 2019-07-22 ENCOUNTER — Encounter
Admit: 2019-07-22 | Discharge: 2019-07-23 | Payer: MEDICARE | Attending: Cardiovascular Disease | Primary: Cardiovascular Disease

## 2019-07-22 DIAGNOSIS — I10 Essential (primary) hypertension: Secondary | ICD-10-CM

## 2019-07-22 DIAGNOSIS — I4891 Unspecified atrial fibrillation: Principal | ICD-10-CM

## 2019-07-22 MED ORDER — RIVAROXABAN 20 MG TABLET
ORAL_TABLET | Freq: Every day | ORAL | 2 refills | 30.00000 days | Status: CP
Start: 2019-07-22 — End: 2019-10-20

## 2019-07-22 MED ORDER — RIVAROXABAN 20 MG TABLET: 20 mg | tablet | Freq: Every day | 2 refills | 30 days | Status: AC

## 2019-07-23 ENCOUNTER — Encounter: Admit: 2019-07-23 | Discharge: 2019-07-24 | Payer: MEDICARE

## 2019-07-23 ENCOUNTER — Ambulatory Visit: Admit: 2019-07-23 | Discharge: 2019-07-24 | Payer: MEDICARE

## 2019-07-23 DIAGNOSIS — M1611 Unilateral primary osteoarthritis, right hip: Principal | ICD-10-CM

## 2019-07-31 ENCOUNTER — Encounter: Admit: 2019-07-31 | Discharge: 2019-08-01 | Payer: MEDICARE

## 2019-07-31 ENCOUNTER — Encounter: Admit: 2019-07-31 | Discharge: 2019-08-01 | Payer: MEDICARE | Attending: Primary Care | Primary: Primary Care

## 2019-07-31 DIAGNOSIS — N401 Enlarged prostate with lower urinary tract symptoms: Secondary | ICD-10-CM

## 2019-07-31 DIAGNOSIS — C9001 Multiple myeloma in remission: Secondary | ICD-10-CM

## 2019-07-31 DIAGNOSIS — D899 Disorder involving the immune mechanism, unspecified: Secondary | ICD-10-CM

## 2019-07-31 DIAGNOSIS — R350 Frequency of micturition: Secondary | ICD-10-CM

## 2019-07-31 DIAGNOSIS — Z9484 Stem cells transplant status: Secondary | ICD-10-CM

## 2019-07-31 DIAGNOSIS — B259 Cytomegaloviral disease, unspecified: Secondary | ICD-10-CM

## 2019-07-31 MED ORDER — MULTIVITAMIN TABLET
ORAL_TABLET | Freq: Every day | ORAL | 2 refills | 120.00000 days
Start: 2019-07-31 — End: 2020-07-30

## 2019-08-05 ENCOUNTER — Encounter: Admit: 2019-08-05 | Discharge: 2019-08-06 | Payer: MEDICARE

## 2019-08-05 ENCOUNTER — Encounter: Admit: 2019-08-05 | Discharge: 2019-08-06 | Payer: MEDICARE | Attending: Internal Medicine | Primary: Internal Medicine

## 2019-08-05 ENCOUNTER — Encounter
Admit: 2019-08-05 | Discharge: 2019-08-06 | Payer: MEDICARE | Attending: Pharmacist Clinician (PhC)/ Clinical Pharmacy Specialist | Primary: Pharmacist Clinician (PhC)/ Clinical Pharmacy Specialist

## 2019-08-05 DIAGNOSIS — Z7901 Long term (current) use of anticoagulants: Secondary | ICD-10-CM

## 2019-08-05 DIAGNOSIS — C9001 Multiple myeloma in remission: Secondary | ICD-10-CM

## 2019-08-05 DIAGNOSIS — Z23 Encounter for immunization: Secondary | ICD-10-CM

## 2019-08-05 DIAGNOSIS — Z9484 Stem cells transplant status: Secondary | ICD-10-CM

## 2019-08-05 DIAGNOSIS — N401 Enlarged prostate with lower urinary tract symptoms: Secondary | ICD-10-CM

## 2019-08-05 DIAGNOSIS — I4891 Unspecified atrial fibrillation: Secondary | ICD-10-CM

## 2019-08-05 DIAGNOSIS — Z48290 Encounter for aftercare following bone marrow transplant: Secondary | ICD-10-CM

## 2019-08-05 DIAGNOSIS — K219 Gastro-esophageal reflux disease without esophagitis: Secondary | ICD-10-CM

## 2019-08-05 DIAGNOSIS — Z79899 Other long term (current) drug therapy: Secondary | ICD-10-CM

## 2019-08-05 DIAGNOSIS — B259 Cytomegaloviral disease, unspecified: Secondary | ICD-10-CM

## 2019-08-05 DIAGNOSIS — R350 Frequency of micturition: Secondary | ICD-10-CM

## 2019-08-05 MED ORDER — VALACYCLOVIR 500 MG TABLET
ORAL_TABLET | Freq: Every day | ORAL | 10 refills | 30 days | Status: CP
Start: 2019-08-05 — End: 2019-09-04

## 2019-08-06 ENCOUNTER — Encounter: Admit: 2019-08-06 | Discharge: 2019-08-07 | Payer: MEDICARE

## 2019-08-21 DIAGNOSIS — C9000 Multiple myeloma not having achieved remission: Secondary | ICD-10-CM

## 2019-08-22 DIAGNOSIS — C9000 Multiple myeloma not having achieved remission: Secondary | ICD-10-CM

## 2019-08-26 ENCOUNTER — Encounter: Admit: 2019-08-26 | Discharge: 2019-08-27 | Payer: MEDICARE

## 2019-08-26 DIAGNOSIS — C9000 Multiple myeloma not having achieved remission: Secondary | ICD-10-CM

## 2019-08-28 ENCOUNTER — Encounter: Admit: 2019-08-28 | Discharge: 2019-08-29 | Payer: MEDICARE

## 2019-08-28 ENCOUNTER — Ambulatory Visit: Admit: 2019-08-28 | Discharge: 2019-08-29 | Payer: MEDICARE

## 2019-08-28 DIAGNOSIS — C9000 Multiple myeloma not having achieved remission: Secondary | ICD-10-CM

## 2019-09-02 DIAGNOSIS — C9000 Multiple myeloma not having achieved remission: Secondary | ICD-10-CM

## 2019-09-02 DIAGNOSIS — Z1159 Encounter for screening for other viral diseases: Secondary | ICD-10-CM

## 2019-09-03 ENCOUNTER — Encounter: Admit: 2019-09-03 | Discharge: 2019-09-04 | Payer: MEDICARE

## 2019-09-03 ENCOUNTER — Ambulatory Visit: Admit: 2019-09-03 | Discharge: 2019-09-04 | Payer: MEDICARE

## 2019-09-03 DIAGNOSIS — L603 Nail dystrophy: Secondary | ICD-10-CM

## 2019-09-03 DIAGNOSIS — Z9484 Stem cells transplant status: Secondary | ICD-10-CM

## 2019-09-03 DIAGNOSIS — B259 Cytomegaloviral disease, unspecified: Secondary | ICD-10-CM

## 2019-09-03 DIAGNOSIS — C9000 Multiple myeloma not having achieved remission: Secondary | ICD-10-CM

## 2019-09-03 DIAGNOSIS — R200 Anesthesia of skin: Secondary | ICD-10-CM

## 2019-09-03 DIAGNOSIS — C9001 Multiple myeloma in remission: Secondary | ICD-10-CM

## 2019-09-03 DIAGNOSIS — Z1159 Encounter for screening for other viral diseases: Secondary | ICD-10-CM

## 2019-09-05 MED ORDER — TAMSULOSIN 0.4 MG CAPSULE
ORAL_CAPSULE | 0 refills | 0 days | Status: CP
Start: 2019-09-05 — End: ?

## 2019-09-11 DIAGNOSIS — C9000 Multiple myeloma not having achieved remission: Principal | ICD-10-CM

## 2019-09-12 ENCOUNTER — Encounter: Admit: 2019-09-12 | Discharge: 2019-09-13 | Payer: MEDICARE

## 2019-10-06 ENCOUNTER — Encounter: Admit: 2019-10-06 | Discharge: 2019-10-07 | Payer: MEDICARE

## 2019-10-06 MED ORDER — OMEPRAZOLE 40 MG CAPSULE,DELAYED RELEASE
ORAL_CAPSULE | Freq: Every day | ORAL | 3 refills | 30.00000 days | Status: CP
Start: 2019-10-06 — End: ?

## 2019-10-08 ENCOUNTER — Encounter: Admit: 2019-10-08 | Discharge: 2019-10-09 | Payer: MEDICARE

## 2019-10-08 DIAGNOSIS — B259 Cytomegaloviral disease, unspecified: Principal | ICD-10-CM

## 2019-10-08 DIAGNOSIS — C9001 Multiple myeloma in remission: Principal | ICD-10-CM

## 2019-10-08 DIAGNOSIS — Z7289 Other problems related to lifestyle: Principal | ICD-10-CM

## 2019-10-08 DIAGNOSIS — Z9484 Stem cells transplant status: Principal | ICD-10-CM

## 2019-10-13 ENCOUNTER — Encounter: Admit: 2019-10-13 | Discharge: 2019-10-14 | Payer: MEDICARE | Attending: Podiatrist | Primary: Podiatrist

## 2019-10-13 DIAGNOSIS — L603 Nail dystrophy: Principal | ICD-10-CM

## 2019-10-13 DIAGNOSIS — G63 Polyneuropathy in diseases classified elsewhere: Principal | ICD-10-CM

## 2019-10-13 DIAGNOSIS — C9000 Multiple myeloma not having achieved remission: Principal | ICD-10-CM

## 2019-10-13 DIAGNOSIS — I872 Venous insufficiency (chronic) (peripheral): Principal | ICD-10-CM

## 2019-10-13 DIAGNOSIS — R600 Localized edema: Principal | ICD-10-CM

## 2019-10-13 DIAGNOSIS — R200 Anesthesia of skin: Principal | ICD-10-CM

## 2019-10-17 ENCOUNTER — Institutional Professional Consult (permissible substitution): Admit: 2019-10-17 | Discharge: 2019-10-18 | Payer: MEDICARE

## 2019-10-17 DIAGNOSIS — Z0000 Encounter for general adult medical examination without abnormal findings: Principal | ICD-10-CM

## 2019-10-21 DIAGNOSIS — R0602 Shortness of breath: Principal | ICD-10-CM

## 2019-10-21 DIAGNOSIS — C9001 Multiple myeloma in remission: Principal | ICD-10-CM

## 2019-10-21 MED ORDER — LENALIDOMIDE 10 MG CAPSULE
ORAL_CAPSULE | Freq: Every day | ORAL | 0 refills | 28 days | Status: CP
Start: 2019-10-21 — End: 2019-11-18

## 2019-10-22 ENCOUNTER — Encounter: Admit: 2019-10-22 | Discharge: 2019-10-22 | Payer: MEDICARE

## 2019-10-22 ENCOUNTER — Ambulatory Visit: Admit: 2019-10-22 | Discharge: 2019-10-22 | Payer: MEDICARE

## 2019-10-22 DIAGNOSIS — C9001 Multiple myeloma in remission: Principal | ICD-10-CM

## 2019-10-22 DIAGNOSIS — R0602 Shortness of breath: Principal | ICD-10-CM

## 2019-10-30 MED ORDER — DILTIAZEM CD 120 MG CAPSULE,EXTENDED RELEASE 24 HR
ORAL_CAPSULE | Freq: Every day | ORAL | 3 refills | 90 days | Status: CP
Start: 2019-10-30 — End: 2019-11-29

## 2019-10-30 MED ORDER — METOPROLOL SUCCINATE ER 200 MG TABLET,EXTENDED RELEASE 24 HR
ORAL_TABLET | Freq: Every day | ORAL | 3 refills | 90 days | Status: CP
Start: 2019-10-30 — End: 2019-11-29

## 2019-11-04 ENCOUNTER — Encounter
Admit: 2019-11-04 | Discharge: 2019-11-05 | Payer: MEDICARE | Attending: Cardiovascular Disease | Primary: Cardiovascular Disease

## 2019-11-11 DIAGNOSIS — C9001 Multiple myeloma in remission: Principal | ICD-10-CM

## 2019-11-12 MED ORDER — LENALIDOMIDE 10 MG CAPSULE
ORAL_CAPSULE | Freq: Every day | ORAL | 0 refills | 28 days | Status: CP
Start: 2019-11-12 — End: 2019-12-10

## 2019-11-13 DIAGNOSIS — R5383 Other fatigue: Principal | ICD-10-CM

## 2019-11-18 ENCOUNTER — Ambulatory Visit: Admit: 2019-11-18 | Discharge: 2019-11-18 | Payer: MEDICARE

## 2019-11-18 ENCOUNTER — Encounter: Admit: 2019-11-18 | Discharge: 2019-11-18 | Payer: MEDICARE

## 2019-11-18 DIAGNOSIS — R5383 Other fatigue: Principal | ICD-10-CM

## 2019-11-18 DIAGNOSIS — Z9484 Stem cells transplant status: Principal | ICD-10-CM

## 2019-11-18 DIAGNOSIS — C9000 Multiple myeloma not having achieved remission: Principal | ICD-10-CM

## 2019-12-01 MED ORDER — TAMSULOSIN 0.4 MG CAPSULE
ORAL_CAPSULE | Freq: Every day | ORAL | 0 refills | 90.00000 days | Status: CP
Start: 2019-12-01 — End: ?

## 2019-12-05 DIAGNOSIS — C9001 Multiple myeloma in remission: Principal | ICD-10-CM

## 2019-12-05 MED ORDER — LENALIDOMIDE 10 MG CAPSULE
ORAL_CAPSULE | Freq: Every day | ORAL | 0 refills | 28.00000 days | Status: CP
Start: 2019-12-05 — End: 2020-01-02

## 2019-12-08 ENCOUNTER — Encounter: Admit: 2019-12-08 | Discharge: 2019-12-08 | Payer: MEDICARE | Attending: Internal Medicine | Primary: Internal Medicine

## 2019-12-08 ENCOUNTER — Encounter: Admit: 2019-12-08 | Discharge: 2019-12-08 | Payer: MEDICARE

## 2019-12-08 DIAGNOSIS — Z9484 Stem cells transplant status: Principal | ICD-10-CM

## 2019-12-08 DIAGNOSIS — Z23 Encounter for immunization: Principal | ICD-10-CM

## 2019-12-08 DIAGNOSIS — B259 Cytomegaloviral disease, unspecified: Principal | ICD-10-CM

## 2019-12-08 DIAGNOSIS — C9001 Multiple myeloma in remission: Principal | ICD-10-CM

## 2019-12-16 ENCOUNTER — Encounter: Admit: 2019-12-16 | Discharge: 2019-12-17 | Payer: MEDICARE

## 2019-12-16 ENCOUNTER — Ambulatory Visit: Admit: 2019-12-16 | Discharge: 2019-12-17 | Payer: MEDICARE

## 2019-12-16 ENCOUNTER — Other Ambulatory Visit: Admit: 2019-12-16 | Discharge: 2019-12-17 | Payer: MEDICARE

## 2019-12-16 DIAGNOSIS — C9000 Multiple myeloma not having achieved remission: Principal | ICD-10-CM

## 2019-12-16 DIAGNOSIS — R5383 Other fatigue: Principal | ICD-10-CM

## 2019-12-16 DIAGNOSIS — C9001 Multiple myeloma in remission: Principal | ICD-10-CM

## 2019-12-16 DIAGNOSIS — Z9484 Stem cells transplant status: Principal | ICD-10-CM

## 2019-12-17 ENCOUNTER — Encounter: Admit: 2019-12-17 | Discharge: 2019-12-18 | Payer: MEDICARE

## 2020-01-06 DIAGNOSIS — C9001 Multiple myeloma in remission: Principal | ICD-10-CM

## 2020-01-06 MED ORDER — LENALIDOMIDE 10 MG CAPSULE
ORAL_CAPSULE | Freq: Every day | ORAL | 0 refills | 28.00000 days | Status: CP
Start: 2020-01-06 — End: 2020-02-03

## 2020-01-07 ENCOUNTER — Ambulatory Visit: Admit: 2020-01-07 | Discharge: 2020-01-08 | Payer: MEDICARE

## 2020-01-14 ENCOUNTER — Encounter: Admit: 2020-01-14 | Discharge: 2020-01-15 | Payer: MEDICARE

## 2020-01-14 DIAGNOSIS — C9000 Multiple myeloma not having achieved remission: Principal | ICD-10-CM

## 2020-01-14 DIAGNOSIS — C9001 Multiple myeloma in remission: Principal | ICD-10-CM

## 2020-01-14 DIAGNOSIS — R5383 Other fatigue: Principal | ICD-10-CM

## 2020-01-14 DIAGNOSIS — Z9484 Stem cells transplant status: Principal | ICD-10-CM

## 2020-02-02 DIAGNOSIS — C9001 Multiple myeloma in remission: Principal | ICD-10-CM

## 2020-02-02 MED ORDER — LENALIDOMIDE 10 MG CAPSULE
ORAL_CAPSULE | Freq: Every day | ORAL | 0 refills | 28.00000 days | Status: CP
Start: 2020-02-02 — End: 2020-03-01

## 2020-02-06 DIAGNOSIS — Z9484 Stem cells transplant status: Principal | ICD-10-CM

## 2020-02-06 DIAGNOSIS — R7303 Prediabetes: Principal | ICD-10-CM

## 2020-02-06 DIAGNOSIS — C9000 Multiple myeloma not having achieved remission: Principal | ICD-10-CM

## 2020-02-06 DIAGNOSIS — R5383 Other fatigue: Principal | ICD-10-CM

## 2020-02-06 DIAGNOSIS — C9001 Multiple myeloma in remission: Principal | ICD-10-CM

## 2020-02-06 MED ORDER — LENALIDOMIDE 15 MG CAPSULE
ORAL_CAPSULE | Freq: Every day | ORAL | 0 refills | 28.00000 days | Status: CP
Start: 2020-02-06 — End: 2020-03-05

## 2020-02-09 DIAGNOSIS — R0600 Dyspnea, unspecified: Principal | ICD-10-CM

## 2020-02-09 MED ORDER — OMEPRAZOLE 40 MG CAPSULE,DELAYED RELEASE
ORAL_CAPSULE | 3 refills | 0 days | Status: CP
Start: 2020-02-09 — End: ?

## 2020-02-11 ENCOUNTER — Ambulatory Visit: Admit: 2020-02-11 | Discharge: 2020-02-12 | Payer: MEDICARE

## 2020-02-11 ENCOUNTER — Encounter: Admit: 2020-02-11 | Discharge: 2020-02-12 | Payer: MEDICARE

## 2020-02-11 DIAGNOSIS — R0600 Dyspnea, unspecified: Principal | ICD-10-CM

## 2020-02-11 DIAGNOSIS — C9001 Multiple myeloma in remission: Principal | ICD-10-CM

## 2020-02-11 DIAGNOSIS — C9000 Multiple myeloma not having achieved remission: Principal | ICD-10-CM

## 2020-02-11 DIAGNOSIS — Z9484 Stem cells transplant status: Principal | ICD-10-CM

## 2020-02-11 DIAGNOSIS — B259 Cytomegaloviral disease, unspecified: Principal | ICD-10-CM

## 2020-02-11 DIAGNOSIS — R5383 Other fatigue: Principal | ICD-10-CM

## 2020-03-01 DIAGNOSIS — C9001 Multiple myeloma in remission: Principal | ICD-10-CM

## 2020-03-01 MED ORDER — LENALIDOMIDE 15 MG CAPSULE
ORAL_CAPSULE | Freq: Every day | ORAL | 0 refills | 28.00000 days | Status: CP
Start: 2020-03-01 — End: 2020-03-29

## 2020-03-03 ENCOUNTER — Encounter: Admit: 2020-03-03 | Discharge: 2020-03-04 | Payer: MEDICARE | Attending: Pediatrics | Primary: Pediatrics

## 2020-03-03 DIAGNOSIS — N401 Enlarged prostate with lower urinary tract symptoms: Secondary | ICD-10-CM

## 2020-03-03 DIAGNOSIS — Z125 Encounter for screening for malignant neoplasm of prostate: Principal | ICD-10-CM

## 2020-03-03 DIAGNOSIS — R350 Frequency of micturition: Secondary | ICD-10-CM

## 2020-03-03 DIAGNOSIS — R7303 Prediabetes: Principal | ICD-10-CM

## 2020-03-03 DIAGNOSIS — Z9484 Stem cells transplant status: Principal | ICD-10-CM

## 2020-03-03 DIAGNOSIS — C9001 Multiple myeloma in remission: Principal | ICD-10-CM

## 2020-03-10 ENCOUNTER — Encounter: Admit: 2020-03-10 | Discharge: 2020-03-10 | Payer: MEDICARE

## 2020-03-10 ENCOUNTER — Ambulatory Visit: Admit: 2020-03-10 | Discharge: 2020-03-10 | Payer: MEDICARE

## 2020-03-10 DIAGNOSIS — Z9484 Stem cells transplant status: Principal | ICD-10-CM

## 2020-03-10 DIAGNOSIS — L97511 Non-pressure chronic ulcer of other part of right foot limited to breakdown of skin: Principal | ICD-10-CM

## 2020-03-10 DIAGNOSIS — R5383 Other fatigue: Principal | ICD-10-CM

## 2020-03-10 DIAGNOSIS — C9000 Multiple myeloma not having achieved remission: Principal | ICD-10-CM

## 2020-03-10 MED ORDER — TAMSULOSIN 0.4 MG CAPSULE
ORAL_CAPSULE | Freq: Every day | ORAL | 0 refills | 90.00000 days | Status: CP
Start: 2020-03-10 — End: ?

## 2020-03-10 MED ORDER — VALACYCLOVIR 500 MG TABLET
ORAL_TABLET | Freq: Every day | ORAL | 0 refills | 90 days | Status: CP
Start: 2020-03-10 — End: ?

## 2020-03-10 MED ORDER — MUPIROCIN 2 % TOPICAL OINTMENT
Freq: Three times a day (TID) | TOPICAL | 0 refills | 7 days | Status: CP
Start: 2020-03-10 — End: 2020-03-17

## 2020-03-26 DIAGNOSIS — C9001 Multiple myeloma in remission: Principal | ICD-10-CM

## 2020-03-29 MED ORDER — LENALIDOMIDE 15 MG CAPSULE
ORAL_CAPSULE | Freq: Every day | ORAL | 0 refills | 28.00000 days | Status: CP
Start: 2020-03-29 — End: 2020-04-26

## 2020-04-07 ENCOUNTER — Other Ambulatory Visit: Admit: 2020-04-07 | Discharge: 2020-04-08 | Payer: MEDICARE

## 2020-04-07 ENCOUNTER — Encounter: Admit: 2020-04-07 | Discharge: 2020-04-08 | Payer: MEDICARE

## 2020-04-07 DIAGNOSIS — L97511 Non-pressure chronic ulcer of other part of right foot limited to breakdown of skin: Principal | ICD-10-CM

## 2020-04-07 DIAGNOSIS — C9001 Multiple myeloma in remission: Principal | ICD-10-CM

## 2020-04-07 DIAGNOSIS — R5383 Other fatigue: Principal | ICD-10-CM

## 2020-04-07 DIAGNOSIS — Z9484 Stem cells transplant status: Principal | ICD-10-CM

## 2020-04-07 DIAGNOSIS — C9000 Multiple myeloma not having achieved remission: Principal | ICD-10-CM

## 2020-04-07 MED ORDER — PSYLLIUM HUSK (ASPARTAME) 3.4 GRAM ORAL POWDER PACKET
Freq: Two times a day (BID) | ORAL | 12 refills | 15.00000 days | PRN
Start: 2020-04-07 — End: 2021-04-07

## 2020-04-22 DIAGNOSIS — C9001 Multiple myeloma in remission: Principal | ICD-10-CM

## 2020-04-22 MED ORDER — LENALIDOMIDE 15 MG CAPSULE
ORAL_CAPSULE | Freq: Every day | ORAL | 0 refills | 28.00000 days | Status: CP
Start: 2020-04-22 — End: 2020-05-20

## 2020-04-28 DIAGNOSIS — C9001 Multiple myeloma in remission: Principal | ICD-10-CM

## 2020-04-28 DIAGNOSIS — R0600 Dyspnea, unspecified: Principal | ICD-10-CM

## 2020-05-04 ENCOUNTER — Ambulatory Visit: Admit: 2020-05-04 | Discharge: 2020-05-05 | Payer: MEDICARE

## 2020-05-04 ENCOUNTER — Other Ambulatory Visit: Admit: 2020-05-04 | Discharge: 2020-05-05 | Payer: MEDICARE

## 2020-05-04 DIAGNOSIS — C9000 Multiple myeloma not having achieved remission: Principal | ICD-10-CM

## 2020-05-04 DIAGNOSIS — R0600 Dyspnea, unspecified: Principal | ICD-10-CM

## 2020-05-04 DIAGNOSIS — R5383 Other fatigue: Principal | ICD-10-CM

## 2020-05-04 DIAGNOSIS — C9001 Multiple myeloma in remission: Principal | ICD-10-CM

## 2020-05-04 DIAGNOSIS — Z9484 Stem cells transplant status: Principal | ICD-10-CM

## 2020-05-26 DIAGNOSIS — C9001 Multiple myeloma in remission: Principal | ICD-10-CM

## 2020-05-27 MED ORDER — LENALIDOMIDE 15 MG CAPSULE
ORAL_CAPSULE | Freq: Every day | ORAL | 0 refills | 28.00000 days | Status: CP
Start: 2020-05-27 — End: 2020-06-24

## 2020-06-01 ENCOUNTER — Ambulatory Visit: Admit: 2020-06-01 | Discharge: 2020-06-01 | Payer: MEDICARE | Attending: Internal Medicine | Primary: Internal Medicine

## 2020-06-01 ENCOUNTER — Encounter: Admit: 2020-06-01 | Discharge: 2020-06-01 | Payer: MEDICARE

## 2020-06-01 DIAGNOSIS — Z9484 Stem cells transplant status: Principal | ICD-10-CM

## 2020-06-01 DIAGNOSIS — C9001 Multiple myeloma in remission: Principal | ICD-10-CM

## 2020-06-01 DIAGNOSIS — C9000 Multiple myeloma not having achieved remission: Principal | ICD-10-CM

## 2020-06-01 DIAGNOSIS — B259 Cytomegaloviral disease, unspecified: Principal | ICD-10-CM

## 2020-06-02 ENCOUNTER — Ambulatory Visit: Admit: 2020-06-02 | Discharge: 2020-06-02 | Payer: MEDICARE

## 2020-06-02 DIAGNOSIS — C9000 Multiple myeloma not having achieved remission: Principal | ICD-10-CM

## 2020-06-02 DIAGNOSIS — Z9484 Stem cells transplant status: Principal | ICD-10-CM

## 2020-06-02 DIAGNOSIS — R5383 Other fatigue: Principal | ICD-10-CM

## 2020-06-15 ENCOUNTER — Institutional Professional Consult (permissible substitution): Admit: 2020-06-15 | Discharge: 2020-06-16 | Payer: MEDICARE

## 2020-06-17 MED ORDER — TAMSULOSIN 0.4 MG CAPSULE
ORAL_CAPSULE | Freq: Every day | ORAL | 0 refills | 90.00000 days | Status: CP
Start: 2020-06-17 — End: ?

## 2020-06-29 ENCOUNTER — Ambulatory Visit
Admit: 2020-06-29 | Discharge: 2020-06-29 | Payer: MEDICARE | Attending: Cardiovascular Disease | Primary: Cardiovascular Disease

## 2020-06-29 ENCOUNTER — Ambulatory Visit: Admit: 2020-06-29 | Discharge: 2020-06-29 | Payer: MEDICARE

## 2020-06-29 ENCOUNTER — Other Ambulatory Visit: Admit: 2020-06-29 | Discharge: 2020-06-29 | Payer: MEDICARE

## 2020-06-29 ENCOUNTER — Encounter: Admit: 2020-06-29 | Discharge: 2020-06-29 | Payer: MEDICARE

## 2020-06-29 DIAGNOSIS — Z9484 Stem cells transplant status: Principal | ICD-10-CM

## 2020-06-29 DIAGNOSIS — R5383 Other fatigue: Principal | ICD-10-CM

## 2020-06-29 DIAGNOSIS — R5382 Chronic fatigue, unspecified: Principal | ICD-10-CM

## 2020-06-29 DIAGNOSIS — C9000 Multiple myeloma not having achieved remission: Principal | ICD-10-CM

## 2020-06-29 DIAGNOSIS — B259 Cytomegaloviral disease, unspecified: Principal | ICD-10-CM

## 2020-06-29 DIAGNOSIS — I10 Essential (primary) hypertension: Principal | ICD-10-CM

## 2020-06-29 DIAGNOSIS — R197 Diarrhea, unspecified: Principal | ICD-10-CM

## 2020-06-29 DIAGNOSIS — C9001 Multiple myeloma in remission: Principal | ICD-10-CM

## 2020-06-29 DIAGNOSIS — Z8679 Personal history of other diseases of the circulatory system: Principal | ICD-10-CM

## 2020-06-29 MED ORDER — LENALIDOMIDE 15 MG CAPSULE
ORAL_CAPSULE | Freq: Every day | ORAL | 0 refills | 28 days | Status: CP
Start: 2020-06-29 — End: 2020-07-27

## 2020-06-29 MED ORDER — METOPROLOL SUCCINATE ER 100 MG TABLET,EXTENDED RELEASE 24 HR
ORAL_TABLET | Freq: Every day | ORAL | 3 refills | 30.00000 days | Status: CP
Start: 2020-06-29 — End: ?

## 2020-06-29 MED ORDER — VALACYCLOVIR 500 MG TABLET
ORAL_TABLET | Freq: Every day | ORAL | 0 refills | 30 days | Status: CP
Start: 2020-06-29 — End: 2020-07-29

## 2020-06-29 MED ORDER — COLESEVELAM 625 MG TABLET
ORAL_TABLET | Freq: Two times a day (BID) | ORAL | 3 refills | 30.00000 days | Status: CP
Start: 2020-06-29 — End: 2021-06-29

## 2020-06-29 NOTE — Unmapped (Signed)
Cycle / Day: C10D1  Arm: Revlimid only  Study number: [id number]    Performance Status: 1    Oral Study Medication Compliance: Patient reports taking all study medication as prescribed.    Narrative: Patient returns for cycle 10 day 1.   AEs and Conmeds reviewed.  Physical exam acquired.  Patient denies nausea, vomiting, headaches throughout the cycle.  Patient reports continued, soft loose stools but not watery while taking Revlimid. Patient states the loose stools are not an issue except when away from home and is not close to a rest room.  Previous right flank pain resolved.  Labs reviewed by provider and coordinator.  Patient cleared for treatment. Patient submitted cycle 9 pill diary. New pill diary provided.      Plan: Return  28 days for cycle 11 day 1.  Questionnaires due on day 1 of cycles 7, 13, 25 and EOT.  24hrurine required every 3 months, C9, C12, C18, etc.  BMB, to include MRD by NGS, required at the end of 12, 18, 24, and 36 months, (ie., C13D1, C19D1, C25D1and at the EOT visit)  Concomitant Medication Log      AE Date Started Date Ended Grade Attribution   Diarrhea (loose stools) 11/2019  2    Pain (Right flank) 04/25/20 06/29/2020 1 Muscle strain vs myeloma   Superficial ulcer (R foot) 03/08/20 04/06/20 1 Increased ambulation   Sore Arm (Right Deltoid) 01/07/20 01/08/20 1 COVID Vaccination injection   Sore Arm (Right Deltoid) 12/17/19 12/18/19 1 COVID Vaccination injection   Fatigue (intermittent) 10/2019  1 Lenalidomide   Hyponatremia 01/14/20  1 Lenalidomide   BASELINE       Diarrhea (loose stools) 2016   Peri-prostate issues   CMV viremia       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)       Osteoarthritis       High blood pressure 2010  2    H/o A fib 05/2019 06/2019 2    Frequent urination 2010  2 Enlarged prostate   Acid Reflux (GERD) 2010  2    Swelling (feet)  10/06/19  1 Post transplant    Sinus headaches (seasonal) 2010  2    Pain (soreness legs) 2015  1 MM lesion   Pain (right hip discomfort) 2015  1 Hip replacement   Alternating Constipation and diarrhea 2010  2 age   Gait abnormality 09/2015  2 Hip replacement, myeloma   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           Medication Dose Start Date Stop Date Indication Related AE, if any   Multivitamin 1 tab 07/2019  Wellbeing    Calcium carbonate  400mg  BID 05/2019  Calcium supplementation    Diltiazem 124 mg PO daily 07/21/19  High blood pressure    Metoprolol 200mg  PO daily 07/21/19  High blood pressure    Tamsulosin 0.4mg  PO daily 05/2019  Urinary Frequency    Docusate sodium 100mg  PO daily 05/2019  Constipation    Omeprazole 40mg  PO daily 06/12/19  Acid reflux    loratidine 10mg  PO daily 2010  Sinus headache-seasonal            Study Medication        Revlimid 15mg  PO daily 02/11/20      Revlimid 10mg  PO daily 10/22/19 02/11/20 MM    Aspirin 81mg  PO daily 10/22/19  DVT prophylaxis

## 2020-07-07 ENCOUNTER — Institutional Professional Consult (permissible substitution): Admit: 2020-07-07 | Discharge: 2020-07-08 | Payer: MEDICARE

## 2020-07-21 ENCOUNTER — Encounter: Admit: 2020-07-21 | Discharge: 2020-07-22 | Payer: MEDICARE

## 2020-07-21 DIAGNOSIS — C9001 Multiple myeloma in remission: Principal | ICD-10-CM

## 2020-07-21 MED ORDER — CHOLESTYRAMINE (WITH SUGAR) 4 GRAM POWDER FOR SUSP IN A PACKET
2 refills | 0 days | Status: CP
Start: 2020-07-21 — End: ?

## 2020-07-26 DIAGNOSIS — C9000 Multiple myeloma not having achieved remission: Principal | ICD-10-CM

## 2020-07-26 DIAGNOSIS — Z9484 Stem cells transplant status: Principal | ICD-10-CM

## 2020-07-26 DIAGNOSIS — R5383 Other fatigue: Principal | ICD-10-CM

## 2020-07-27 DIAGNOSIS — R0600 Dyspnea, unspecified: Principal | ICD-10-CM

## 2020-07-27 DIAGNOSIS — C9001 Multiple myeloma in remission: Principal | ICD-10-CM

## 2020-07-28 ENCOUNTER — Ambulatory Visit: Admit: 2020-07-28 | Discharge: 2020-07-28 | Payer: MEDICARE

## 2020-07-28 ENCOUNTER — Encounter: Admit: 2020-07-28 | Discharge: 2020-07-28 | Payer: MEDICARE

## 2020-07-28 ENCOUNTER — Other Ambulatory Visit: Admit: 2020-07-28 | Discharge: 2020-07-28 | Payer: MEDICARE

## 2020-07-28 DIAGNOSIS — C9001 Multiple myeloma in remission: Principal | ICD-10-CM

## 2020-07-28 DIAGNOSIS — Z9484 Stem cells transplant status: Principal | ICD-10-CM

## 2020-07-28 DIAGNOSIS — R5383 Other fatigue: Principal | ICD-10-CM

## 2020-07-28 DIAGNOSIS — C9000 Multiple myeloma not having achieved remission: Principal | ICD-10-CM

## 2020-07-28 MED ORDER — DILTIAZEM CD 120 MG CAPSULE,EXTENDED RELEASE 24 HR
ORAL_CAPSULE | Freq: Every day | ORAL | 3 refills | 90.00000 days | Status: CP
Start: 2020-07-28 — End: 2020-08-27

## 2020-07-28 MED ORDER — LENALIDOMIDE 15 MG CAPSULE
ORAL_CAPSULE | Freq: Every day | ORAL | 0 refills | 28.00000 days | Status: CP
Start: 2020-07-28 — End: 2020-08-25

## 2020-07-28 MED ORDER — OMEPRAZOLE 40 MG CAPSULE,DELAYED RELEASE
ORAL_CAPSULE | Freq: Every day | ORAL | 3 refills | 90.00000 days | Status: CP
Start: 2020-07-28 — End: ?

## 2020-07-30 MED ORDER — LENALIDOMIDE 15 MG CAPSULE
ORAL_CAPSULE | Freq: Every day | ORAL | 0 refills | 28 days | Status: CP
Start: 2020-07-30 — End: 2020-08-27

## 2020-08-03 ENCOUNTER — Ambulatory Visit
Admit: 2020-08-03 | Discharge: 2020-08-04 | Payer: MEDICARE | Attending: Cardiovascular Disease | Primary: Cardiovascular Disease

## 2020-08-03 DIAGNOSIS — R5383 Other fatigue: Principal | ICD-10-CM

## 2020-08-11 ENCOUNTER — Encounter: Admit: 2020-08-11 | Discharge: 2020-08-12 | Payer: MEDICARE

## 2020-08-11 DIAGNOSIS — C9001 Multiple myeloma in remission: Principal | ICD-10-CM

## 2020-08-23 DIAGNOSIS — R5383 Other fatigue: Principal | ICD-10-CM

## 2020-08-23 DIAGNOSIS — C9000 Multiple myeloma not having achieved remission: Principal | ICD-10-CM

## 2020-08-23 DIAGNOSIS — C9001 Multiple myeloma in remission: Principal | ICD-10-CM

## 2020-08-23 DIAGNOSIS — Z9484 Stem cells transplant status: Principal | ICD-10-CM

## 2020-08-23 MED ORDER — LENALIDOMIDE 15 MG CAPSULE
ORAL_CAPSULE | Freq: Every day | ORAL | 0 refills | 28 days | Status: CP
Start: 2020-08-23 — End: 2020-09-20

## 2020-08-24 ENCOUNTER — Encounter: Admit: 2020-08-24 | Discharge: 2020-08-25 | Payer: MEDICARE

## 2020-08-24 ENCOUNTER — Ambulatory Visit: Admit: 2020-08-24 | Discharge: 2020-08-25 | Payer: MEDICARE

## 2020-08-24 ENCOUNTER — Other Ambulatory Visit: Admit: 2020-08-24 | Discharge: 2020-08-25 | Payer: MEDICARE

## 2020-08-24 DIAGNOSIS — C9001 Multiple myeloma in remission: Principal | ICD-10-CM

## 2020-08-24 DIAGNOSIS — R5383 Other fatigue: Principal | ICD-10-CM

## 2020-08-24 DIAGNOSIS — Z9484 Stem cells transplant status: Principal | ICD-10-CM

## 2020-08-24 DIAGNOSIS — R0600 Dyspnea, unspecified: Principal | ICD-10-CM

## 2020-08-24 DIAGNOSIS — C9000 Multiple myeloma not having achieved remission: Principal | ICD-10-CM

## 2020-08-24 MED ORDER — VALACYCLOVIR 500 MG TABLET
ORAL_TABLET | Freq: Every day | ORAL | 0 refills | 30 days | Status: CP
Start: 2020-08-24 — End: 2020-09-23

## 2020-08-26 ENCOUNTER — Other Ambulatory Visit: Admit: 2020-08-26 | Discharge: 2020-08-27 | Payer: MEDICARE

## 2020-08-26 DIAGNOSIS — C9000 Multiple myeloma not having achieved remission: Principal | ICD-10-CM

## 2020-08-26 DIAGNOSIS — R5383 Other fatigue: Principal | ICD-10-CM

## 2020-08-26 DIAGNOSIS — Z9484 Stem cells transplant status: Principal | ICD-10-CM

## 2020-09-03 ENCOUNTER — Encounter: Admit: 2020-09-03 | Discharge: 2020-09-04 | Payer: MEDICARE | Attending: Pediatrics | Primary: Pediatrics

## 2020-09-03 DIAGNOSIS — R7303 Prediabetes: Principal | ICD-10-CM

## 2020-09-03 DIAGNOSIS — I10 Essential (primary) hypertension: Principal | ICD-10-CM

## 2020-09-03 DIAGNOSIS — C9001 Multiple myeloma in remission: Principal | ICD-10-CM

## 2020-09-16 DIAGNOSIS — C9001 Multiple myeloma in remission: Principal | ICD-10-CM

## 2020-09-16 MED ORDER — LENALIDOMIDE 15 MG CAPSULE
ORAL_CAPSULE | Freq: Every day | ORAL | 0 refills | 28.00000 days | Status: CP
Start: 2020-09-16 — End: 2020-10-14

## 2020-09-20 ENCOUNTER — Ambulatory Visit: Admit: 2020-09-20 | Discharge: 2020-09-21 | Payer: MEDICARE

## 2020-09-20 ENCOUNTER — Encounter: Admit: 2020-09-20 | Discharge: 2020-09-21 | Payer: MEDICARE

## 2020-09-20 ENCOUNTER — Other Ambulatory Visit: Admit: 2020-09-20 | Discharge: 2020-09-21 | Payer: MEDICARE

## 2020-09-20 DIAGNOSIS — C9000 Multiple myeloma not having achieved remission: Principal | ICD-10-CM

## 2020-09-20 DIAGNOSIS — C9001 Multiple myeloma in remission: Principal | ICD-10-CM

## 2020-09-20 DIAGNOSIS — R5383 Other fatigue: Principal | ICD-10-CM

## 2020-09-20 DIAGNOSIS — Z9484 Stem cells transplant status: Principal | ICD-10-CM

## 2020-09-27 MED ORDER — TAMSULOSIN 0.4 MG CAPSULE
ORAL_CAPSULE | ORAL | 0 refills | 0.00000 days | Status: CP
Start: 2020-09-27 — End: 2021-01-02

## 2020-10-13 DIAGNOSIS — C9001 Multiple myeloma in remission: Principal | ICD-10-CM

## 2020-10-13 MED ORDER — LENALIDOMIDE 15 MG CAPSULE
ORAL_CAPSULE | Freq: Every day | ORAL | 0 refills | 28.00000 days | Status: CP
Start: 2020-10-13 — End: 2020-11-04

## 2020-10-18 DIAGNOSIS — Z20822 Contact with and (suspected) exposure to covid-19: Principal | ICD-10-CM

## 2020-10-18 DIAGNOSIS — C9001 Multiple myeloma in remission: Principal | ICD-10-CM

## 2020-10-19 ENCOUNTER — Ambulatory Visit: Admit: 2020-10-19 | Discharge: 2020-10-20 | Payer: MEDICARE

## 2020-10-19 ENCOUNTER — Other Ambulatory Visit: Admit: 2020-10-19 | Discharge: 2020-10-20 | Payer: MEDICARE

## 2020-10-19 DIAGNOSIS — R5383 Other fatigue: Principal | ICD-10-CM

## 2020-10-19 DIAGNOSIS — Z9484 Stem cells transplant status: Principal | ICD-10-CM

## 2020-10-19 DIAGNOSIS — M898X5 Other specified disorders of bone, thigh: Principal | ICD-10-CM

## 2020-10-19 DIAGNOSIS — Z96641 Presence of right artificial hip joint: Principal | ICD-10-CM

## 2020-10-19 DIAGNOSIS — C9001 Multiple myeloma in remission: Principal | ICD-10-CM

## 2020-10-19 DIAGNOSIS — C9000 Multiple myeloma not having achieved remission: Principal | ICD-10-CM

## 2020-10-19 DIAGNOSIS — G629 Polyneuropathy, unspecified: Principal | ICD-10-CM

## 2020-10-19 DIAGNOSIS — M25551 Pain in right hip: Principal | ICD-10-CM

## 2020-10-19 DIAGNOSIS — Z7982 Long term (current) use of aspirin: Principal | ICD-10-CM

## 2020-10-19 DIAGNOSIS — R197 Diarrhea, unspecified: Principal | ICD-10-CM

## 2020-10-19 DIAGNOSIS — K219 Gastro-esophageal reflux disease without esophagitis: Principal | ICD-10-CM

## 2020-10-19 DIAGNOSIS — K5900 Constipation, unspecified: Principal | ICD-10-CM

## 2020-10-19 DIAGNOSIS — Z9221 Personal history of antineoplastic chemotherapy: Principal | ICD-10-CM

## 2020-10-19 DIAGNOSIS — Z006 Encounter for examination for normal comparison and control in clinical research program: Principal | ICD-10-CM

## 2020-11-04 DIAGNOSIS — C9001 Multiple myeloma in remission: Principal | ICD-10-CM

## 2020-11-05 MED ORDER — LENALIDOMIDE 15 MG CAPSULE
ORAL_CAPSULE | Freq: Every day | ORAL | 0 refills | 28.00000 days | Status: CP
Start: 2020-11-05 — End: 2020-12-10

## 2020-11-09 DIAGNOSIS — C9000 Multiple myeloma not having achieved remission: Principal | ICD-10-CM

## 2020-11-15 DIAGNOSIS — C9000 Multiple myeloma not having achieved remission: Principal | ICD-10-CM

## 2020-11-15 DIAGNOSIS — Z9484 Stem cells transplant status: Principal | ICD-10-CM

## 2020-11-15 DIAGNOSIS — R5383 Other fatigue: Principal | ICD-10-CM

## 2020-11-16 ENCOUNTER — Other Ambulatory Visit: Admit: 2020-11-16 | Discharge: 2020-11-17 | Payer: MEDICARE

## 2020-11-16 ENCOUNTER — Ambulatory Visit: Admit: 2020-11-16 | Discharge: 2020-11-17 | Payer: MEDICARE

## 2020-11-16 DIAGNOSIS — C9000 Multiple myeloma not having achieved remission: Principal | ICD-10-CM

## 2020-11-16 DIAGNOSIS — Z006 Encounter for examination for normal comparison and control in clinical research program: Principal | ICD-10-CM

## 2020-11-16 DIAGNOSIS — R5383 Other fatigue: Principal | ICD-10-CM

## 2020-11-16 DIAGNOSIS — Z79899 Other long term (current) drug therapy: Principal | ICD-10-CM

## 2020-11-16 DIAGNOSIS — C9001 Multiple myeloma in remission: Principal | ICD-10-CM

## 2020-11-16 DIAGNOSIS — Z9484 Stem cells transplant status: Principal | ICD-10-CM

## 2020-12-02 DIAGNOSIS — I10 Essential (primary) hypertension: Principal | ICD-10-CM

## 2020-12-02 DIAGNOSIS — Z8679 Personal history of other diseases of the circulatory system: Principal | ICD-10-CM

## 2020-12-02 MED ORDER — METOPROLOL SUCCINATE ER 100 MG TABLET,EXTENDED RELEASE 24 HR
ORAL_TABLET | Freq: Every day | ORAL | 3 refills | 90 days | Status: CP
Start: 2020-12-02 — End: ?

## 2020-12-07 ENCOUNTER — Ambulatory Visit: Admit: 2020-12-07 | Discharge: 2020-12-08 | Payer: MEDICARE

## 2020-12-07 ENCOUNTER — Ambulatory Visit: Admit: 2020-12-07 | Discharge: 2020-12-08 | Payer: MEDICARE | Attending: Internal Medicine | Primary: Internal Medicine

## 2020-12-07 DIAGNOSIS — Z23 Encounter for immunization: Principal | ICD-10-CM

## 2020-12-07 DIAGNOSIS — C9000 Multiple myeloma not having achieved remission: Principal | ICD-10-CM

## 2020-12-07 DIAGNOSIS — Z9484 Stem cells transplant status: Principal | ICD-10-CM

## 2020-12-08 DIAGNOSIS — C9001 Multiple myeloma in remission: Principal | ICD-10-CM

## 2020-12-08 MED ORDER — VALACYCLOVIR 500 MG TABLET
ORAL_TABLET | Freq: Every day | ORAL | 3 refills | 60 days | Status: CP
Start: 2020-12-08 — End: 2021-02-06

## 2020-12-10 DIAGNOSIS — Z9484 Stem cells transplant status: Principal | ICD-10-CM

## 2020-12-10 DIAGNOSIS — C9000 Multiple myeloma not having achieved remission: Principal | ICD-10-CM

## 2020-12-10 DIAGNOSIS — C9001 Multiple myeloma in remission: Principal | ICD-10-CM

## 2020-12-10 DIAGNOSIS — R5383 Other fatigue: Principal | ICD-10-CM

## 2020-12-10 MED ORDER — LENALIDOMIDE 15 MG CAPSULE
ORAL_CAPSULE | Freq: Every day | ORAL | 0 refills | 28.00000 days | Status: CP
Start: 2020-12-10 — End: 2020-12-15

## 2020-12-14 ENCOUNTER — Ambulatory Visit: Admit: 2020-12-14 | Discharge: 2020-12-15 | Payer: MEDICARE

## 2020-12-14 ENCOUNTER — Other Ambulatory Visit: Admit: 2020-12-14 | Discharge: 2020-12-15 | Payer: MEDICARE

## 2020-12-14 DIAGNOSIS — D801 Nonfamilial hypogammaglobulinemia: Principal | ICD-10-CM

## 2020-12-14 DIAGNOSIS — Z7982 Long term (current) use of aspirin: Principal | ICD-10-CM

## 2020-12-14 DIAGNOSIS — Z9484 Stem cells transplant status: Principal | ICD-10-CM

## 2020-12-14 DIAGNOSIS — I10 Essential (primary) hypertension: Principal | ICD-10-CM

## 2020-12-14 DIAGNOSIS — R5383 Other fatigue: Principal | ICD-10-CM

## 2020-12-14 DIAGNOSIS — Z96641 Presence of right artificial hip joint: Principal | ICD-10-CM

## 2020-12-14 DIAGNOSIS — K5900 Constipation, unspecified: Principal | ICD-10-CM

## 2020-12-14 DIAGNOSIS — R7303 Prediabetes: Principal | ICD-10-CM

## 2020-12-14 DIAGNOSIS — C9000 Multiple myeloma not having achieved remission: Principal | ICD-10-CM

## 2020-12-14 DIAGNOSIS — Z9221 Personal history of antineoplastic chemotherapy: Principal | ICD-10-CM

## 2020-12-14 DIAGNOSIS — K219 Gastro-esophageal reflux disease without esophagitis: Principal | ICD-10-CM

## 2020-12-14 DIAGNOSIS — Z79899 Other long term (current) drug therapy: Principal | ICD-10-CM

## 2020-12-14 DIAGNOSIS — R197 Diarrhea, unspecified: Principal | ICD-10-CM

## 2020-12-14 DIAGNOSIS — C9001 Multiple myeloma in remission: Principal | ICD-10-CM

## 2020-12-14 DIAGNOSIS — G629 Polyneuropathy, unspecified: Principal | ICD-10-CM

## 2020-12-14 DIAGNOSIS — N400 Enlarged prostate without lower urinary tract symptoms: Principal | ICD-10-CM

## 2020-12-15 DIAGNOSIS — C9001 Multiple myeloma in remission: Principal | ICD-10-CM

## 2020-12-15 MED ORDER — LENALIDOMIDE 15 MG CAPSULE
ORAL_CAPSULE | Freq: Every day | ORAL | 0 refills | 28.00000 days | Status: CP
Start: 2020-12-15 — End: 2021-01-12

## 2020-12-16 DIAGNOSIS — C9001 Multiple myeloma in remission: Principal | ICD-10-CM

## 2020-12-23 ENCOUNTER — Ambulatory Visit
Admit: 2020-12-23 | Discharge: 2020-12-24 | Payer: MEDICARE | Attending: Rehabilitative and Restorative Service Providers" | Primary: Rehabilitative and Restorative Service Providers"

## 2020-12-23 DIAGNOSIS — C9001 Multiple myeloma in remission: Principal | ICD-10-CM

## 2020-12-23 DIAGNOSIS — C9000 Multiple myeloma not having achieved remission: Principal | ICD-10-CM

## 2020-12-23 DIAGNOSIS — R5381 Other malaise: Principal | ICD-10-CM

## 2021-01-02 DIAGNOSIS — N400 Enlarged prostate without lower urinary tract symptoms: Principal | ICD-10-CM

## 2021-01-02 MED ORDER — TAMSULOSIN 0.4 MG CAPSULE
ORAL_CAPSULE | Freq: Every day | ORAL | 0 refills | 90 days | Status: CP
Start: 2021-01-02 — End: ?

## 2021-01-03 ENCOUNTER — Ambulatory Visit
Admit: 2021-01-03 | Discharge: 2021-01-04 | Payer: MEDICARE | Attending: Rehabilitative and Restorative Service Providers" | Primary: Rehabilitative and Restorative Service Providers"

## 2021-01-03 DIAGNOSIS — R5381 Other malaise: Principal | ICD-10-CM

## 2021-01-03 DIAGNOSIS — C9000 Multiple myeloma not having achieved remission: Principal | ICD-10-CM

## 2021-01-03 DIAGNOSIS — C9001 Multiple myeloma in remission: Principal | ICD-10-CM

## 2021-01-10 DIAGNOSIS — C9000 Multiple myeloma not having achieved remission: Principal | ICD-10-CM

## 2021-01-10 DIAGNOSIS — R5383 Other fatigue: Principal | ICD-10-CM

## 2021-01-10 DIAGNOSIS — Z9484 Stem cells transplant status: Principal | ICD-10-CM

## 2021-01-12 ENCOUNTER — Other Ambulatory Visit: Admit: 2021-01-12 | Discharge: 2021-01-13 | Payer: MEDICARE

## 2021-01-12 ENCOUNTER — Ambulatory Visit: Admit: 2021-01-12 | Discharge: 2021-01-13 | Payer: MEDICARE

## 2021-01-12 DIAGNOSIS — C9000 Multiple myeloma not having achieved remission: Principal | ICD-10-CM

## 2021-01-12 DIAGNOSIS — C9001 Multiple myeloma in remission: Principal | ICD-10-CM

## 2021-01-12 DIAGNOSIS — Z9484 Stem cells transplant status: Principal | ICD-10-CM

## 2021-01-12 DIAGNOSIS — R5383 Other fatigue: Principal | ICD-10-CM

## 2021-01-12 MED ORDER — LENALIDOMIDE 15 MG CAPSULE
ORAL_CAPSULE | Freq: Every day | ORAL | 0 refills | 28 days | Status: CP
Start: 2021-01-12 — End: 2021-02-09

## 2021-01-17 ENCOUNTER — Ambulatory Visit: Admit: 2021-01-17 | Discharge: 2021-01-17 | Payer: MEDICARE | Attending: Podiatrist | Primary: Podiatrist

## 2021-01-17 ENCOUNTER — Ambulatory Visit
Admit: 2021-01-17 | Discharge: 2021-01-17 | Payer: MEDICARE | Attending: Rehabilitative and Restorative Service Providers" | Primary: Rehabilitative and Restorative Service Providers"

## 2021-01-17 DIAGNOSIS — M21622 Bunionette of left foot: Principal | ICD-10-CM

## 2021-01-17 DIAGNOSIS — L84 Corns and callosities: Principal | ICD-10-CM

## 2021-01-17 DIAGNOSIS — I878 Other specified disorders of veins: Principal | ICD-10-CM

## 2021-01-17 DIAGNOSIS — M216X2 Other acquired deformities of left foot: Principal | ICD-10-CM

## 2021-02-02 ENCOUNTER — Ambulatory Visit
Admit: 2021-02-02 | Discharge: 2021-02-03 | Payer: MEDICARE | Attending: Rehabilitative and Restorative Service Providers" | Primary: Rehabilitative and Restorative Service Providers"

## 2021-02-04 DIAGNOSIS — C9001 Multiple myeloma in remission: Principal | ICD-10-CM

## 2021-02-04 MED ORDER — LENALIDOMIDE 15 MG CAPSULE
ORAL_CAPSULE | Freq: Every day | ORAL | 0 refills | 28 days | Status: CP
Start: 2021-02-04 — End: 2021-03-04

## 2021-02-08 ENCOUNTER — Ambulatory Visit: Admit: 2021-02-08 | Discharge: 2021-02-09 | Payer: MEDICARE

## 2021-02-08 ENCOUNTER — Other Ambulatory Visit: Admit: 2021-02-08 | Discharge: 2021-02-09 | Payer: MEDICARE

## 2021-02-08 DIAGNOSIS — Z9484 Stem cells transplant status: Principal | ICD-10-CM

## 2021-02-08 DIAGNOSIS — C9000 Multiple myeloma not having achieved remission: Principal | ICD-10-CM

## 2021-02-08 DIAGNOSIS — R5383 Other fatigue: Principal | ICD-10-CM

## 2021-02-10 DIAGNOSIS — Z9484 Stem cells transplant status: Principal | ICD-10-CM

## 2021-02-10 DIAGNOSIS — Z23 Encounter for immunization: Principal | ICD-10-CM

## 2021-02-10 DIAGNOSIS — C9000 Multiple myeloma not having achieved remission: Principal | ICD-10-CM

## 2021-02-14 ENCOUNTER — Ambulatory Visit
Admit: 2021-02-14 | Discharge: 2021-02-15 | Payer: MEDICARE | Attending: Rehabilitative and Restorative Service Providers" | Primary: Rehabilitative and Restorative Service Providers"

## 2021-02-14 DIAGNOSIS — C9001 Multiple myeloma in remission: Principal | ICD-10-CM

## 2021-02-15 ENCOUNTER — Ambulatory Visit: Admit: 2021-02-15 | Discharge: 2021-02-16 | Payer: MEDICARE

## 2021-02-15 DIAGNOSIS — R059 Cough in adult patient: Principal | ICD-10-CM

## 2021-02-15 MED ORDER — SPIRIVA RESPIMAT 2.5 MCG/ACTUATION SOLUTION FOR INHALATION
Freq: Every day | RESPIRATORY_TRACT | 2 refills | 0 days | Status: CP
Start: 2021-02-15 — End: ?

## 2021-02-15 MED ORDER — FLUTICASONE PROPIONATE 50 MCG/ACTUATION NASAL SPRAY,SUSPENSION
Freq: Every evening | NASAL | 5 refills | 0 days | Status: CP
Start: 2021-02-15 — End: 2022-02-15

## 2021-02-16 DIAGNOSIS — K219 Gastro-esophageal reflux disease without esophagitis: Principal | ICD-10-CM

## 2021-02-16 DIAGNOSIS — C9000 Multiple myeloma not having achieved remission: Principal | ICD-10-CM

## 2021-02-16 MED ORDER — OMEPRAZOLE 40 MG CAPSULE,DELAYED RELEASE
ORAL_CAPSULE | Freq: Every day | ORAL | 0 refills | 30 days | Status: CP
Start: 2021-02-16 — End: ?

## 2021-02-21 ENCOUNTER — Ambulatory Visit: Admit: 2021-02-21 | Discharge: 2021-02-22 | Payer: MEDICARE

## 2021-02-28 ENCOUNTER — Ambulatory Visit
Admit: 2021-02-28 | Discharge: 2021-03-01 | Payer: MEDICARE | Attending: Rehabilitative and Restorative Service Providers" | Primary: Rehabilitative and Restorative Service Providers"

## 2021-02-28 DIAGNOSIS — C9000 Multiple myeloma not having achieved remission: Principal | ICD-10-CM

## 2021-02-28 DIAGNOSIS — R197 Diarrhea, unspecified: Principal | ICD-10-CM

## 2021-02-28 MED ORDER — CHOLESTYRAMINE (WITH SUGAR) 4 GRAM POWDER FOR SUSP IN A PACKET
PACK | Freq: Two times a day (BID) | ORAL | 3 refills | 60 days | Status: CP
Start: 2021-02-28 — End: ?

## 2021-03-04 ENCOUNTER — Ambulatory Visit: Admit: 2021-03-04 | Discharge: 2021-03-05 | Payer: MEDICARE

## 2021-03-04 DIAGNOSIS — R350 Frequency of micturition: Principal | ICD-10-CM

## 2021-03-04 DIAGNOSIS — I2510 Atherosclerotic heart disease of native coronary artery without angina pectoris: Principal | ICD-10-CM

## 2021-03-04 DIAGNOSIS — C9000 Multiple myeloma not having achieved remission: Principal | ICD-10-CM

## 2021-03-04 DIAGNOSIS — I10 Essential (primary) hypertension: Principal | ICD-10-CM

## 2021-03-04 DIAGNOSIS — I482 Chronic atrial fibrillation, unspecified: Principal | ICD-10-CM

## 2021-03-04 DIAGNOSIS — N401 Enlarged prostate with lower urinary tract symptoms: Principal | ICD-10-CM

## 2021-03-04 DIAGNOSIS — R739 Hyperglycemia, unspecified: Principal | ICD-10-CM

## 2021-03-07 DIAGNOSIS — C9001 Multiple myeloma in remission: Principal | ICD-10-CM

## 2021-03-07 MED ORDER — LENALIDOMIDE 15 MG CAPSULE
ORAL_CAPSULE | Freq: Every day | ORAL | 0 refills | 28 days | Status: CP
Start: 2021-03-07 — End: 2021-04-04

## 2021-03-08 ENCOUNTER — Other Ambulatory Visit: Admit: 2021-03-08 | Discharge: 2021-03-08 | Payer: MEDICARE

## 2021-03-08 ENCOUNTER — Ambulatory Visit: Admit: 2021-03-08 | Discharge: 2021-03-08 | Payer: MEDICARE

## 2021-03-08 DIAGNOSIS — Z9484 Stem cells transplant status: Principal | ICD-10-CM

## 2021-03-08 DIAGNOSIS — Z23 Encounter for immunization: Principal | ICD-10-CM

## 2021-03-08 DIAGNOSIS — R5383 Other fatigue: Principal | ICD-10-CM

## 2021-03-08 DIAGNOSIS — C9000 Multiple myeloma not having achieved remission: Principal | ICD-10-CM

## 2021-03-15 ENCOUNTER — Ambulatory Visit
Admit: 2021-03-15 | Discharge: 2021-03-16 | Payer: MEDICARE | Attending: Cardiovascular Disease | Primary: Cardiovascular Disease

## 2021-03-15 DIAGNOSIS — R5382 Chronic fatigue, unspecified: Principal | ICD-10-CM

## 2021-03-15 DIAGNOSIS — C9000 Multiple myeloma not having achieved remission: Principal | ICD-10-CM

## 2021-03-15 DIAGNOSIS — Z8679 Personal history of other diseases of the circulatory system: Principal | ICD-10-CM

## 2021-03-16 DIAGNOSIS — C9000 Multiple myeloma not having achieved remission: Principal | ICD-10-CM

## 2021-03-16 DIAGNOSIS — K219 Gastro-esophageal reflux disease without esophagitis: Principal | ICD-10-CM

## 2021-03-16 MED ORDER — OMEPRAZOLE 40 MG CAPSULE,DELAYED RELEASE
ORAL_CAPSULE | Freq: Every day | ORAL | 0 refills | 90.00000 days | Status: CP
Start: 2021-03-16 — End: ?

## 2021-03-17 ENCOUNTER — Ambulatory Visit: Admit: 2021-03-17 | Discharge: 2021-03-18 | Payer: MEDICARE | Attending: Podiatrist | Primary: Podiatrist

## 2021-03-17 DIAGNOSIS — C9000 Multiple myeloma not having achieved remission: Principal | ICD-10-CM

## 2021-03-17 DIAGNOSIS — K219 Gastro-esophageal reflux disease without esophagitis: Principal | ICD-10-CM

## 2021-03-17 DIAGNOSIS — G63 Polyneuropathy in diseases classified elsewhere: Principal | ICD-10-CM

## 2021-03-17 DIAGNOSIS — I878 Other specified disorders of veins: Principal | ICD-10-CM

## 2021-03-17 DIAGNOSIS — M216X2 Other acquired deformities of left foot: Principal | ICD-10-CM

## 2021-03-17 DIAGNOSIS — M21622 Bunionette of left foot: Principal | ICD-10-CM

## 2021-03-17 MED ORDER — OMEPRAZOLE 40 MG CAPSULE,DELAYED RELEASE
ORAL_CAPSULE | 0 refills | 0 days | Status: CP
Start: 2021-03-17 — End: ?

## 2021-03-22 DIAGNOSIS — C9000 Multiple myeloma not having achieved remission: Principal | ICD-10-CM

## 2021-03-22 DIAGNOSIS — Z9484 Stem cells transplant status: Principal | ICD-10-CM

## 2021-03-22 DIAGNOSIS — R5382 Chronic fatigue, unspecified: Principal | ICD-10-CM

## 2021-03-30 DIAGNOSIS — C9001 Multiple myeloma in remission: Principal | ICD-10-CM

## 2021-03-30 MED ORDER — LENALIDOMIDE 15 MG CAPSULE
ORAL_CAPSULE | Freq: Every day | ORAL | 0 refills | 28 days | Status: CP
Start: 2021-03-30 — End: 2021-04-27

## 2021-04-04 ENCOUNTER — Ambulatory Visit
Admit: 2021-04-04 | Discharge: 2021-04-05 | Payer: MEDICARE | Attending: Rehabilitative and Restorative Service Providers" | Primary: Rehabilitative and Restorative Service Providers"

## 2021-04-05 ENCOUNTER — Ambulatory Visit: Admit: 2021-04-05 | Discharge: 2021-04-05 | Payer: MEDICARE

## 2021-04-05 ENCOUNTER — Other Ambulatory Visit: Admit: 2021-04-05 | Discharge: 2021-04-05 | Payer: MEDICARE

## 2021-04-05 DIAGNOSIS — C9000 Multiple myeloma not having achieved remission: Principal | ICD-10-CM

## 2021-04-05 DIAGNOSIS — R5382 Chronic fatigue, unspecified: Principal | ICD-10-CM

## 2021-04-05 DIAGNOSIS — Z9484 Stem cells transplant status: Principal | ICD-10-CM

## 2021-04-05 MED ORDER — TAMSULOSIN 0.4 MG CAPSULE
ORAL_CAPSULE | Freq: Every day | ORAL | 0 refills | 90 days | Status: CP
Start: 2021-04-05 — End: ?

## 2021-04-11 ENCOUNTER — Ambulatory Visit: Admit: 2021-04-11 | Discharge: 2021-04-12 | Payer: MEDICARE | Attending: Podiatrist | Primary: Podiatrist

## 2021-04-11 DIAGNOSIS — M21622 Bunionette of left foot: Principal | ICD-10-CM

## 2021-04-11 DIAGNOSIS — G629 Polyneuropathy, unspecified: Principal | ICD-10-CM

## 2021-04-18 ENCOUNTER — Ambulatory Visit
Admit: 2021-04-18 | Discharge: 2021-04-19 | Payer: MEDICARE | Attending: Rehabilitative and Restorative Service Providers" | Primary: Rehabilitative and Restorative Service Providers"

## 2021-04-27 DIAGNOSIS — C9001 Multiple myeloma in remission: Principal | ICD-10-CM

## 2021-04-27 MED ORDER — LENALIDOMIDE 15 MG CAPSULE
ORAL_CAPSULE | Freq: Every day | ORAL | 0 refills | 28 days | Status: CP
Start: 2021-04-27 — End: 2021-05-25

## 2021-04-28 ENCOUNTER — Ambulatory Visit: Admit: 2021-04-28 | Discharge: 2021-04-29 | Payer: MEDICARE | Attending: Podiatrist | Primary: Podiatrist

## 2021-04-28 DIAGNOSIS — G629 Polyneuropathy, unspecified: Principal | ICD-10-CM

## 2021-04-28 DIAGNOSIS — M21622 Bunionette of left foot: Principal | ICD-10-CM

## 2021-04-28 DIAGNOSIS — I872 Venous insufficiency (chronic) (peripheral): Principal | ICD-10-CM

## 2021-04-28 DIAGNOSIS — M216X2 Other acquired deformities of left foot: Principal | ICD-10-CM

## 2021-04-28 DIAGNOSIS — C9000 Multiple myeloma not having achieved remission: Principal | ICD-10-CM

## 2021-04-28 DIAGNOSIS — G63 Polyneuropathy in diseases classified elsewhere: Principal | ICD-10-CM

## 2021-04-28 DIAGNOSIS — L84 Corns and callosities: Principal | ICD-10-CM

## 2021-05-02 ENCOUNTER — Ambulatory Visit
Admit: 2021-05-02 | Discharge: 2021-05-03 | Payer: MEDICARE | Attending: Rehabilitative and Restorative Service Providers" | Primary: Rehabilitative and Restorative Service Providers"

## 2021-05-03 ENCOUNTER — Ambulatory Visit: Admit: 2021-05-03 | Discharge: 2021-05-04 | Payer: MEDICARE

## 2021-05-03 ENCOUNTER — Other Ambulatory Visit: Admit: 2021-05-03 | Discharge: 2021-05-04 | Payer: MEDICARE

## 2021-05-03 DIAGNOSIS — R5382 Chronic fatigue, unspecified: Principal | ICD-10-CM

## 2021-05-03 DIAGNOSIS — Z9484 Stem cells transplant status: Principal | ICD-10-CM

## 2021-05-03 DIAGNOSIS — C9001 Multiple myeloma in remission: Principal | ICD-10-CM

## 2021-05-03 DIAGNOSIS — C9000 Multiple myeloma not having achieved remission: Principal | ICD-10-CM

## 2021-05-10 ENCOUNTER — Ambulatory Visit: Admit: 2021-05-10 | Discharge: 2021-05-11 | Payer: MEDICARE | Attending: Podiatrist | Primary: Podiatrist

## 2021-05-10 DIAGNOSIS — C9000 Multiple myeloma not having achieved remission: Principal | ICD-10-CM

## 2021-05-10 DIAGNOSIS — I878 Other specified disorders of veins: Principal | ICD-10-CM

## 2021-05-10 DIAGNOSIS — G63 Polyneuropathy in diseases classified elsewhere: Principal | ICD-10-CM

## 2021-05-10 DIAGNOSIS — L84 Corns and callosities: Principal | ICD-10-CM

## 2021-05-10 DIAGNOSIS — M21622 Bunionette of left foot: Principal | ICD-10-CM

## 2021-05-26 ENCOUNTER — Ambulatory Visit: Admit: 2021-05-26 | Discharge: 2021-05-27 | Payer: MEDICARE | Attending: Podiatrist | Primary: Podiatrist

## 2021-05-26 DIAGNOSIS — L97521 Non-pressure chronic ulcer of other part of left foot limited to breakdown of skin: Principal | ICD-10-CM

## 2021-05-26 DIAGNOSIS — M21622 Bunionette of left foot: Principal | ICD-10-CM

## 2021-05-26 DIAGNOSIS — C9001 Multiple myeloma in remission: Principal | ICD-10-CM

## 2021-05-26 DIAGNOSIS — G629 Polyneuropathy, unspecified: Principal | ICD-10-CM

## 2021-05-26 MED ORDER — LENALIDOMIDE 15 MG CAPSULE
ORAL_CAPSULE | Freq: Every day | ORAL | 0 refills | 28 days | Status: CP
Start: 2021-05-26 — End: 2021-06-23

## 2021-05-29 DIAGNOSIS — Z23 Encounter for immunization: Principal | ICD-10-CM

## 2021-05-29 DIAGNOSIS — Z9484 Stem cells transplant status: Principal | ICD-10-CM

## 2021-05-29 DIAGNOSIS — C9001 Multiple myeloma in remission: Principal | ICD-10-CM

## 2021-06-01 ENCOUNTER — Ambulatory Visit: Admit: 2021-06-01 | Discharge: 2021-06-02 | Payer: MEDICARE

## 2021-06-01 ENCOUNTER — Other Ambulatory Visit: Admit: 2021-06-01 | Discharge: 2021-06-02 | Payer: MEDICARE

## 2021-06-01 DIAGNOSIS — C9001 Multiple myeloma in remission: Principal | ICD-10-CM

## 2021-06-01 DIAGNOSIS — R5382 Chronic fatigue, unspecified: Principal | ICD-10-CM

## 2021-06-01 DIAGNOSIS — Z9484 Stem cells transplant status: Principal | ICD-10-CM

## 2021-06-01 DIAGNOSIS — Z23 Encounter for immunization: Principal | ICD-10-CM

## 2021-06-03 DIAGNOSIS — R059 Cough in adult patient: Principal | ICD-10-CM

## 2021-06-03 MED ORDER — SPIRIVA RESPIMAT 2.5 MCG/ACTUATION SOLUTION FOR INHALATION
3 refills | 0 days | Status: CP
Start: 2021-06-03 — End: ?

## 2021-06-15 DIAGNOSIS — K219 Gastro-esophageal reflux disease without esophagitis: Principal | ICD-10-CM

## 2021-06-15 DIAGNOSIS — C9000 Multiple myeloma not having achieved remission: Principal | ICD-10-CM

## 2021-06-15 MED ORDER — OMEPRAZOLE 40 MG CAPSULE,DELAYED RELEASE
ORAL_CAPSULE | Freq: Every day | ORAL | 0 refills | 60 days | Status: CP
Start: 2021-06-15 — End: ?

## 2021-06-21 ENCOUNTER — Ambulatory Visit: Admit: 2021-06-21 | Discharge: 2021-06-22 | Payer: MEDICARE

## 2021-06-23 ENCOUNTER — Ambulatory Visit: Admit: 2021-06-23 | Discharge: 2021-06-24 | Payer: MEDICARE | Attending: Podiatrist | Primary: Podiatrist

## 2021-06-23 DIAGNOSIS — L97521 Non-pressure chronic ulcer of other part of left foot limited to breakdown of skin: Principal | ICD-10-CM

## 2021-06-23 DIAGNOSIS — G629 Polyneuropathy, unspecified: Principal | ICD-10-CM

## 2021-06-23 DIAGNOSIS — I878 Other specified disorders of veins: Principal | ICD-10-CM

## 2021-06-23 DIAGNOSIS — R7303 Prediabetes: Principal | ICD-10-CM

## 2021-06-23 DIAGNOSIS — M21622 Bunionette of left foot: Principal | ICD-10-CM

## 2021-06-23 DIAGNOSIS — E11621 Type 2 diabetes mellitus with foot ulcer: Principal | ICD-10-CM

## 2021-06-23 DIAGNOSIS — B070 Plantar wart: Principal | ICD-10-CM

## 2021-06-23 DIAGNOSIS — R911 Solitary pulmonary nodule: Principal | ICD-10-CM

## 2021-06-23 DIAGNOSIS — C9001 Multiple myeloma in remission: Principal | ICD-10-CM

## 2021-06-23 MED ORDER — LENALIDOMIDE 10 MG CAPSULE
ORAL_CAPSULE | Freq: Every day | ORAL | 0 refills | 28 days | Status: CP
Start: 2021-06-23 — End: 2021-07-21

## 2021-06-28 ENCOUNTER — Other Ambulatory Visit: Admit: 2021-06-28 | Discharge: 2021-06-28 | Payer: MEDICARE

## 2021-06-28 ENCOUNTER — Ambulatory Visit
Admit: 2021-06-28 | Discharge: 2021-06-28 | Payer: MEDICARE | Attending: Nurse Practitioner | Primary: Nurse Practitioner

## 2021-06-28 DIAGNOSIS — Z23 Encounter for immunization: Principal | ICD-10-CM

## 2021-06-28 DIAGNOSIS — Z9484 Stem cells transplant status: Principal | ICD-10-CM

## 2021-06-28 DIAGNOSIS — C9001 Multiple myeloma in remission: Principal | ICD-10-CM

## 2021-06-28 DIAGNOSIS — R197 Diarrhea, unspecified: Principal | ICD-10-CM

## 2021-06-28 DIAGNOSIS — C9000 Multiple myeloma not having achieved remission: Principal | ICD-10-CM

## 2021-06-28 MED ORDER — CHOLESTYRAMINE (WITH SUGAR) 4 GRAM POWDER FOR SUSP IN A PACKET
Freq: Every day | ORAL | 0 days
Start: 2021-06-28 — End: ?

## 2021-06-29 ENCOUNTER — Ambulatory Visit: Admit: 2021-06-29 | Discharge: 2021-06-30 | Payer: MEDICARE

## 2021-06-29 DIAGNOSIS — C9001 Multiple myeloma in remission: Principal | ICD-10-CM

## 2021-07-11 DIAGNOSIS — N400 Enlarged prostate without lower urinary tract symptoms: Principal | ICD-10-CM

## 2021-07-11 MED ORDER — TAMSULOSIN 0.4 MG CAPSULE
ORAL_CAPSULE | Freq: Every day | ORAL | 0 refills | 90.00000 days | Status: CP
Start: 2021-07-11 — End: ?

## 2021-07-15 ENCOUNTER — Ambulatory Visit: Admit: 2021-07-15 | Discharge: 2021-07-16 | Payer: MEDICARE

## 2021-07-15 DIAGNOSIS — M25552 Pain in left hip: Principal | ICD-10-CM

## 2021-07-18 ENCOUNTER — Ambulatory Visit: Admit: 2021-07-18 | Discharge: 2021-07-19 | Payer: MEDICARE

## 2021-07-19 DIAGNOSIS — M25552 Pain in left hip: Principal | ICD-10-CM

## 2021-07-21 ENCOUNTER — Ambulatory Visit: Admit: 2021-07-21 | Discharge: 2021-07-22 | Payer: MEDICARE | Attending: Podiatrist | Primary: Podiatrist

## 2021-07-21 DIAGNOSIS — Z872 Personal history of diseases of the skin and subcutaneous tissue: Principal | ICD-10-CM

## 2021-07-21 DIAGNOSIS — I878 Other specified disorders of veins: Principal | ICD-10-CM

## 2021-07-21 DIAGNOSIS — G629 Polyneuropathy, unspecified: Principal | ICD-10-CM

## 2021-07-21 DIAGNOSIS — M21622 Bunionette of left foot: Principal | ICD-10-CM

## 2021-07-22 ENCOUNTER — Ambulatory Visit: Admit: 2021-07-22 | Discharge: 2021-07-23 | Payer: MEDICARE

## 2021-07-22 DIAGNOSIS — M1612 Unilateral primary osteoarthritis, left hip: Principal | ICD-10-CM

## 2021-07-22 DIAGNOSIS — C9000 Multiple myeloma not having achieved remission: Principal | ICD-10-CM

## 2021-07-22 DIAGNOSIS — C9001 Multiple myeloma in remission: Principal | ICD-10-CM

## 2021-07-22 MED ORDER — LENALIDOMIDE 10 MG CAPSULE
ORAL_CAPSULE | Freq: Every day | ORAL | 0 refills | 28 days | Status: CP
Start: 2021-07-22 — End: 2021-08-19

## 2021-07-26 ENCOUNTER — Other Ambulatory Visit: Admit: 2021-07-26 | Discharge: 2021-07-27 | Payer: MEDICARE

## 2021-07-26 ENCOUNTER — Ambulatory Visit: Admit: 2021-07-26 | Discharge: 2021-07-27 | Payer: MEDICARE

## 2021-07-26 DIAGNOSIS — R5382 Chronic fatigue, unspecified: Principal | ICD-10-CM

## 2021-07-26 DIAGNOSIS — C9001 Multiple myeloma in remission: Principal | ICD-10-CM

## 2021-07-26 DIAGNOSIS — Z9484 Stem cells transplant status: Principal | ICD-10-CM

## 2021-07-27 ENCOUNTER — Ambulatory Visit: Admit: 2021-07-27 | Discharge: 2021-07-28 | Payer: MEDICARE

## 2021-07-27 DIAGNOSIS — M1612 Unilateral primary osteoarthritis, left hip: Principal | ICD-10-CM

## 2021-08-02 MED ORDER — DILTIAZEM CD 120 MG CAPSULE,EXTENDED RELEASE 24 HR
ORAL_CAPSULE | Freq: Every day | ORAL | 3 refills | 90 days | Status: CP
Start: 2021-08-02 — End: 2021-09-01

## 2021-08-03 DIAGNOSIS — R911 Solitary pulmonary nodule: Principal | ICD-10-CM

## 2021-08-09 DIAGNOSIS — C9001 Multiple myeloma in remission: Principal | ICD-10-CM

## 2021-08-09 MED ORDER — VALACYCLOVIR 500 MG TABLET
ORAL_TABLET | Freq: Every day | ORAL | 3 refills | 60 days | Status: CP
Start: 2021-08-09 — End: 2021-10-08

## 2021-08-12 ENCOUNTER — Ambulatory Visit: Admit: 2021-08-12 | Discharge: 2021-08-13 | Payer: MEDICARE

## 2021-08-17 DIAGNOSIS — C9000 Multiple myeloma not having achieved remission: Principal | ICD-10-CM

## 2021-08-17 DIAGNOSIS — K219 Gastro-esophageal reflux disease without esophagitis: Principal | ICD-10-CM

## 2021-08-17 DIAGNOSIS — R197 Diarrhea, unspecified: Principal | ICD-10-CM

## 2021-08-17 MED ORDER — OMEPRAZOLE 40 MG CAPSULE,DELAYED RELEASE
ORAL_CAPSULE | Freq: Every day | ORAL | 0 refills | 60 days | Status: CP
Start: 2021-08-17 — End: ?

## 2021-08-18 MED ORDER — OMEPRAZOLE 40 MG CAPSULE,DELAYED RELEASE
ORAL_CAPSULE | 0 refills | 0 days | Status: CP
Start: 2021-08-18 — End: ?

## 2021-08-19 DIAGNOSIS — R197 Diarrhea, unspecified: Principal | ICD-10-CM

## 2021-08-19 DIAGNOSIS — C9000 Multiple myeloma not having achieved remission: Principal | ICD-10-CM

## 2021-08-19 DIAGNOSIS — C9001 Multiple myeloma in remission: Principal | ICD-10-CM

## 2021-08-19 MED ORDER — LENALIDOMIDE 10 MG CAPSULE
ORAL_CAPSULE | Freq: Every day | ORAL | 0 refills | 28 days | Status: CP
Start: 2021-08-19 — End: 2021-09-16

## 2021-08-22 DIAGNOSIS — C9000 Multiple myeloma not having achieved remission: Principal | ICD-10-CM

## 2021-08-22 DIAGNOSIS — R195 Other fecal abnormalities: Principal | ICD-10-CM

## 2021-08-22 DIAGNOSIS — R197 Diarrhea, unspecified: Principal | ICD-10-CM

## 2021-08-22 DIAGNOSIS — C9001 Multiple myeloma in remission: Principal | ICD-10-CM

## 2021-08-23 ENCOUNTER — Other Ambulatory Visit: Admit: 2021-08-23 | Discharge: 2021-08-24 | Payer: MEDICARE

## 2021-08-23 ENCOUNTER — Ambulatory Visit: Admit: 2021-08-23 | Discharge: 2021-08-24 | Payer: MEDICARE

## 2021-08-23 DIAGNOSIS — R5382 Chronic fatigue, unspecified: Principal | ICD-10-CM

## 2021-08-23 DIAGNOSIS — Z23 Encounter for immunization: Principal | ICD-10-CM

## 2021-08-23 DIAGNOSIS — Z9484 Stem cells transplant status: Principal | ICD-10-CM

## 2021-08-23 DIAGNOSIS — C9001 Multiple myeloma in remission: Principal | ICD-10-CM

## 2021-08-24 DIAGNOSIS — R197 Diarrhea, unspecified: Principal | ICD-10-CM

## 2021-08-24 DIAGNOSIS — C9000 Multiple myeloma not having achieved remission: Principal | ICD-10-CM

## 2021-08-24 DIAGNOSIS — R195 Other fecal abnormalities: Principal | ICD-10-CM

## 2021-08-24 MED ORDER — CHOLESTYRAMINE (WITH SUGAR) 4 GRAM POWDER FOR SUSP IN A PACKET
Freq: Every day | ORAL | 2 refills | 60 days | Status: CP
Start: 2021-08-24 — End: ?

## 2021-08-27 DIAGNOSIS — Z9484 Stem cells transplant status: Principal | ICD-10-CM

## 2021-08-27 DIAGNOSIS — C9001 Multiple myeloma in remission: Principal | ICD-10-CM

## 2021-08-27 DIAGNOSIS — Z23 Encounter for immunization: Principal | ICD-10-CM

## 2021-09-02 ENCOUNTER — Ambulatory Visit: Admit: 2021-09-02 | Discharge: 2021-09-03 | Payer: MEDICARE

## 2021-09-02 DIAGNOSIS — K828 Other specified diseases of gallbladder: Principal | ICD-10-CM

## 2021-09-03 DIAGNOSIS — M1612 Unilateral primary osteoarthritis, left hip: Secondary | ICD-10-CM | POA: Insufficient documentation

## 2021-09-03 DIAGNOSIS — R5382 Chronic fatigue, unspecified: Secondary | ICD-10-CM | POA: Insufficient documentation

## 2021-09-03 DIAGNOSIS — T753XXA Motion sickness, initial encounter: Secondary | ICD-10-CM | POA: Insufficient documentation

## 2021-09-19 DIAGNOSIS — C9001 Multiple myeloma in remission: Principal | ICD-10-CM

## 2021-09-19 MED ORDER — LENALIDOMIDE 10 MG CAPSULE
ORAL_CAPSULE | Freq: Every day | ORAL | 0 refills | 28.00000 days | Status: CP
Start: 2021-09-19 — End: 2021-10-17

## 2021-09-20 ENCOUNTER — Ambulatory Visit: Admit: 2021-09-20 | Discharge: 2021-09-21 | Payer: MEDICARE

## 2021-09-20 ENCOUNTER — Other Ambulatory Visit: Admit: 2021-09-20 | Discharge: 2021-09-21 | Payer: MEDICARE

## 2021-09-20 DIAGNOSIS — R5382 Chronic fatigue, unspecified: Principal | ICD-10-CM

## 2021-09-20 DIAGNOSIS — Z9484 Stem cells transplant status: Principal | ICD-10-CM

## 2021-09-20 DIAGNOSIS — Z23 Encounter for immunization: Principal | ICD-10-CM

## 2021-09-20 DIAGNOSIS — C9001 Multiple myeloma in remission: Principal | ICD-10-CM

## 2021-09-27 ENCOUNTER — Ambulatory Visit
Admit: 2021-09-27 | Discharge: 2021-09-27 | Payer: MEDICARE | Attending: Cardiovascular Disease | Primary: Cardiovascular Disease

## 2021-09-27 DIAGNOSIS — I10 Essential (primary) hypertension: Principal | ICD-10-CM

## 2021-09-27 DIAGNOSIS — I482 Chronic atrial fibrillation, unspecified: Principal | ICD-10-CM

## 2021-09-27 DIAGNOSIS — Z8679 Personal history of other diseases of the circulatory system: Principal | ICD-10-CM

## 2021-09-27 MED ORDER — METOPROLOL SUCCINATE ER 50 MG TABLET,EXTENDED RELEASE 24 HR
ORAL_TABLET | Freq: Every day | ORAL | 11 refills | 30.00000 days | Status: CP
Start: 2021-09-27 — End: 2022-09-22

## 2021-10-07 DIAGNOSIS — N400 Enlarged prostate without lower urinary tract symptoms: Principal | ICD-10-CM

## 2021-10-10 MED ORDER — TAMSULOSIN 0.4 MG CAPSULE
ORAL_CAPSULE | Freq: Every day | ORAL | 0 refills | 90 days | Status: CP
Start: 2021-10-10 — End: ?

## 2021-10-13 DIAGNOSIS — C9001 Multiple myeloma in remission: Principal | ICD-10-CM

## 2021-10-14 MED ORDER — LENALIDOMIDE 10 MG CAPSULE
ORAL_CAPSULE | Freq: Every day | ORAL | 0 refills | 28 days | Status: CP
Start: 2021-10-14 — End: 2021-11-11

## 2021-10-19 ENCOUNTER — Ambulatory Visit: Admit: 2021-10-19 | Discharge: 2021-10-19 | Payer: MEDICARE

## 2021-10-19 DIAGNOSIS — R5382 Chronic fatigue, unspecified: Principal | ICD-10-CM

## 2021-10-19 DIAGNOSIS — R911 Solitary pulmonary nodule: Principal | ICD-10-CM

## 2021-10-19 DIAGNOSIS — Z9484 Stem cells transplant status: Principal | ICD-10-CM

## 2021-10-19 DIAGNOSIS — C9001 Multiple myeloma in remission: Principal | ICD-10-CM

## 2021-10-27 DIAGNOSIS — C9000 Multiple myeloma not having achieved remission: Principal | ICD-10-CM

## 2021-10-27 DIAGNOSIS — K219 Gastro-esophageal reflux disease without esophagitis: Principal | ICD-10-CM

## 2021-10-28 MED ORDER — OMEPRAZOLE 40 MG CAPSULE,DELAYED RELEASE
ORAL_CAPSULE | Freq: Every day | ORAL | 0 refills | 90 days | Status: CP
Start: 2021-10-28 — End: ?

## 2021-11-04 ENCOUNTER — Ambulatory Visit: Admit: 2021-11-04 | Discharge: 2021-11-05 | Payer: MEDICARE

## 2021-11-11 DIAGNOSIS — C9001 Multiple myeloma in remission: Principal | ICD-10-CM

## 2021-11-11 MED ORDER — LENALIDOMIDE 10 MG CAPSULE
ORAL_CAPSULE | Freq: Every day | ORAL | 0 refills | 28 days | Status: CP
Start: 2021-11-11 — End: 2021-12-09

## 2021-11-15 DIAGNOSIS — C9001 Multiple myeloma in remission: Principal | ICD-10-CM

## 2021-11-15 DIAGNOSIS — R5382 Chronic fatigue, unspecified: Principal | ICD-10-CM

## 2021-11-15 DIAGNOSIS — Z9484 Stem cells transplant status: Principal | ICD-10-CM

## 2021-11-16 ENCOUNTER — Ambulatory Visit: Admit: 2021-11-16 | Discharge: 2021-11-16 | Payer: MEDICARE

## 2021-11-16 ENCOUNTER — Institutional Professional Consult (permissible substitution): Admit: 2021-11-16 | Discharge: 2021-11-16 | Payer: MEDICARE

## 2021-11-16 DIAGNOSIS — Z9484 Stem cells transplant status: Principal | ICD-10-CM

## 2021-11-16 DIAGNOSIS — Z23 Encounter for immunization: Principal | ICD-10-CM

## 2021-11-16 DIAGNOSIS — C9001 Multiple myeloma in remission: Principal | ICD-10-CM

## 2021-11-16 DIAGNOSIS — C9000 Multiple myeloma not having achieved remission: Principal | ICD-10-CM

## 2021-11-22 ENCOUNTER — Ambulatory Visit: Admit: 2021-11-22 | Discharge: 2021-11-23 | Payer: MEDICARE | Attending: Podiatrist | Primary: Podiatrist

## 2021-11-22 DIAGNOSIS — B353 Tinea pedis: Principal | ICD-10-CM

## 2021-11-22 DIAGNOSIS — S90221D Contusion of right lesser toe(s) with damage to nail, subsequent encounter: Principal | ICD-10-CM

## 2021-11-22 DIAGNOSIS — I878 Other specified disorders of veins: Principal | ICD-10-CM

## 2021-11-22 DIAGNOSIS — S90415D Abrasion, left lesser toe(s), subsequent encounter: Principal | ICD-10-CM

## 2021-11-22 DIAGNOSIS — M21622 Bunionette of left foot: Principal | ICD-10-CM

## 2021-11-22 DIAGNOSIS — G629 Polyneuropathy, unspecified: Principal | ICD-10-CM

## 2021-11-26 NOTE — Discharge Instructions (Signed)
Instructions after Total Hip Replacement     Jago Carton P. Anjel Pardo, Jr., M.D.     Dept. of Orthopaedics & Sports Medicine  [facility]  [address]  [place], [place]  [address]  Phone: [phone]   Fax: [phone]    DIET: . Drink plenty of non-alcoholic fluids. . Resume your normal diet. Include foods high in fiber.  ACTIVITY:  . You may use crutches or a walker with weight-bearing as tolerated, unless instructed otherwise. . You may be weaned off of the walker or crutches by your Physical Therapist.  . Do NOT reach below the level of your knees or cross your legs until allowed.    . Continue doing gentle exercises. Exercising will reduce the pain and swelling, increase motion, and prevent muscle weakness.   . Please continue to use the TED compression stockings for 6 weeks. You may remove the stockings at night, but should reapply them in the morning. . Do not drive or operate any equipment until instructed.  WOUND CARE:  . Continue to use ice packs periodically to reduce pain and swelling. . Keep the incision clean and dry. . You may bathe or shower after the staples are removed at the first office visit following surgery.  MEDICATIONS: . You may resume your regular medications. . Please take the pain medication as prescribed on the medication. . Do not take pain medication on an empty stomach. . You have been given a prescription for a blood thinner to prevent blood clots. Please take the medication as instructed. (NOTE: After completing a 2 week course of Lovenox, take one Enteric-coated aspirin once a day.) . Pain medications and iron supplements can cause constipation. Use a stool softener (Senokot or Colace) on a daily basis and a laxative (dulcolax or miralax) as needed. . Do not drive or drink alcoholic beverages when taking pain medications.  CALL THE OFFICE FOR: . Temperature above 101 degrees . Excessive bleeding or drainage on the dressing. . Excessive  swelling, coldness, or paleness of the toes. . Persistent nausea and vomiting.  FOLLOW-UP:  . You should have an appointment to return to the office in 6 weeks after surgery. . Arrangements have been made for continuation of Physical Therapy (either home therapy or outpatient therapy).     [facility] Department Directory         [email or web address]       [email or web address]          Cardiology  Appointments: [place] - [phone] [place] - [phone]  Endocrinology  Appointments: [place] - [phone] [place] - [phone]  Gastroenterology  Appointments: [place] - [phone] [place] - [phone]        General Surgery   Appointments: [place] - [phone]  Internal Medicine/Family Medicine  Appointments: [place] - [phone] [place] - [phone] [place] - [phone]  Metabolic and Weigh Loss Surgery  Appointments: [place] - [phone]        Neurology  Appointments: [place] - [phone] [place] - [phone]  Neurosurgery  Appointments: [place] - [phone]  Obstetrics & Gynecology  Appointments: [place] - [phone] [place] - [phone]        Pediatrics  Appointments: [place] - [phone] [place] - [phone]  Physiatry  Appointments: [place] -[phone]  Physical Therapy  Appointments: [place] - [phone] [place] - [phone]        Podiatry  Appointments: [place] - [phone] [place] - [phone]  Pulmonology  Appointments: [place] - [phone]  Rheumatology  Appointments:  - [phone]         Location: [facility]  [address] [place], [place]  [address]  [place] Location: [facility] [address] [place], [place]  [address]  [place] Location: [facility] [address] [place], [place]  [address]    

## 2021-12-05 ENCOUNTER — Other Ambulatory Visit: Payer: Self-pay

## 2021-12-05 ENCOUNTER — Encounter
Admission: RE | Admit: 2021-12-05 | Discharge: 2021-12-05 | Disposition: A | Discharge status: Home / Self Care | Payer: Medicare PPO | Source: Ambulatory Visit | Attending: Orthopedic Surgery | Admitting: Orthopedic Surgery

## 2021-12-05 VITALS — BP 147/77 | HR 76 | Temp 98.0°F | Resp 20 | Ht 79.0 in | Wt 277.4 lb

## 2021-12-05 DIAGNOSIS — R7303 Prediabetes: Secondary | ICD-10-CM | POA: Diagnosis not present

## 2021-12-05 DIAGNOSIS — Z9484 Stem cells transplant status: Secondary | ICD-10-CM | POA: Insufficient documentation

## 2021-12-05 DIAGNOSIS — E7439 Other disorders of intestinal carbohydrate absorption: Secondary | ICD-10-CM | POA: Diagnosis not present

## 2021-12-05 DIAGNOSIS — Z01812 Encounter for preprocedural laboratory examination: Secondary | ICD-10-CM

## 2021-12-05 DIAGNOSIS — Z01818 Encounter for other preprocedural examination: Secondary | ICD-10-CM | POA: Insufficient documentation

## 2021-12-05 DIAGNOSIS — C9001 Multiple myeloma in remission: Secondary | ICD-10-CM | POA: Insufficient documentation

## 2021-12-05 DIAGNOSIS — M1612 Unilateral primary osteoarthritis, left hip: Secondary | ICD-10-CM | POA: Diagnosis not present

## 2021-12-05 HISTORY — DX: Essential (primary) hypertension: I10

## 2021-12-05 HISTORY — DX: Malignant (primary) neoplasm, unspecified: C80.1

## 2021-12-05 HISTORY — DX: Prediabetes: R73.03

## 2021-12-05 LAB — CBC WITH DIFFERENTIAL/PLATELET
Abs Immature Granulocytes: 0.02 10*3/uL (ref 0.00–0.07)
Basophils Absolute: 0 10*3/uL (ref 0.0–0.1)
Basophils Relative: 1 %
Eosinophils Absolute: 0 10*3/uL (ref 0.0–0.5)
Eosinophils Relative: 0 %
HCT: 41.4 % (ref 39.0–52.0)
Hemoglobin: 14.1 g/dL (ref 13.0–17.0)
Immature Granulocytes: 1 %
Lymphocytes Relative: 28 %
Lymphs Abs: 1.2 10*3/uL (ref 0.7–4.0)
MCH: 33.2 pg (ref 26.0–34.0)
MCHC: 34.1 g/dL (ref 30.0–36.0)
MCV: 97.4 fL (ref 80.0–100.0)
Monocytes Absolute: 0.5 10*3/uL (ref 0.1–1.0)
Monocytes Relative: 12 %
Neutro Abs: 2.6 10*3/uL (ref 1.7–7.7)
Neutrophils Relative %: 58 %
Platelets: 159 10*3/uL (ref 150–400)
RBC: 4.25 MIL/uL (ref 4.22–5.81)
RDW: 13.6 % (ref 11.5–15.5)
WBC: 4.3 10*3/uL (ref 4.0–10.5)
nRBC: 0 % (ref 0.0–0.2)

## 2021-12-05 LAB — COMPREHENSIVE METABOLIC PANEL
ALT: 22 U/L (ref 0–44)
AST: 24 U/L (ref 15–41)
Albumin: 4 g/dL (ref 3.5–5.0)
Alkaline Phosphatase: 70 U/L (ref 38–126)
Anion gap: 9 (ref 5–15)
BUN: 20 mg/dL (ref 8–23)
CO2: 25 mmol/L (ref 22–32)
Calcium: 8.3 mg/dL — ABNORMAL LOW (ref 8.9–10.3)
Chloride: 103 mmol/L (ref 98–111)
Creatinine, Ser: 1.09 mg/dL (ref 0.61–1.24)
GFR, Estimated: >60 mL/min (ref >60)
Glucose, Bld: 129 mg/dL — ABNORMAL HIGH (ref 70–99)
Potassium: 3.7 mmol/L (ref 3.5–5.1)
Sodium: 137 mmol/L (ref 135–145)
Total Bilirubin: 1 mg/dL (ref 0.3–1.2)
Total Protein: 7.2 g/dL (ref 6.5–8.1)

## 2021-12-05 LAB — SEDIMENTATION RATE: Sed Rate: 7 mm/hr (ref 0–20)

## 2021-12-05 LAB — SURGICAL PCR SCREEN
MRSA, PCR: NEGATIVE
Staphylococcus aureus: NEGATIVE

## 2021-12-05 LAB — HEMOGLOBIN A1C
Hgb A1c MFr Bld: 5.3 % (ref 4.8–5.6)
Mean Plasma Glucose: 105.41 mg/dL

## 2021-12-05 LAB — URINALYSIS, ROUTINE W REFLEX MICROSCOPIC
Bilirubin Urine: NEGATIVE
Glucose, UA: NEGATIVE mg/dL
Hgb urine dipstick: NEGATIVE
Ketones, ur: NEGATIVE mg/dL
Leukocytes,Ua: NEGATIVE
Nitrite: NEGATIVE
Protein, ur: NEGATIVE mg/dL
Specific Gravity, Urine: 1.021 (ref 1.005–1.030)
pH: 5 (ref 5.0–8.0)

## 2021-12-05 LAB — C-REACTIVE PROTEIN: CRP: <0.5 mg/dL (ref <1.0)

## 2021-12-05 LAB — PROTIME-INR
INR: 1 (ref 0.8–1.2)
Prothrombin Time: 13.2 seconds (ref 11.4–15.2)

## 2021-12-05 LAB — APTT: aPTT: 27 seconds (ref 24–36)

## 2021-12-05 LAB — TYPE AND SCREEN
ABO/RH(D): A POS
Antibody Screen: NEGATIVE

## 2021-12-05 NOTE — Patient Instructions (Addendum)
Your procedure is scheduled on: Wednesday December 14, 2021. Report to Day Surgery inside [facility] 2nd floor. To find out your arrival time please call [phone] between 1PM - 3PM on Tuesday December 13, 2021.  Remember: Instructions that are not followed completely may result in serious medical risk,  up to and including death, or upon the discretion of your surgeon and anesthesiologist your  surgery may need to be rescheduled.     _X__ 1. Do not eat food after midnight the night before your procedure.                 No chewing gum or hard candies. You may drink clear liquids up to 2 hours                 before you are scheduled to arrive for your surgery- DO not drink clear                 liquids within 2 hours of the start of your surgery.                 Clear Liquids include:  water, apple juice without pulp, clear Gatorade, G2 or                  Gatorade Zero (avoid Red/Purple/Blue), Black Coffee or Tea (Do not add                 anything to coffee or tea).  __X__2.   Complete the "Ensure Clear Pre-surgery Clear Carbohydrate Drink" provided to you, 2 hours before arrival. **If you are diabetic you will be provided with an alternative drink, Gatorade Zero or G2.  __X__3.  On the morning of surgery brush your teeth with toothpaste and water, you                may rinse your mouth with mouthwash if you wish.  Do not swallow any toothpaste of mouthwash.     _X__ 4.  No Alcohol for 24 hours before or after surgery.   _X__ 5.  Do Not Smoke or use e-cigarettes For 24 Hours Prior to Your Surgery.                 Do not use any chewable tobacco products for at least 6 hours prior to                 Surgery.  _X__  6.  Do not use any recreational drugs (marijuana, cocaine, heroin, ecstasy, MDMA or other)                For at least one week prior to your surgery.  Combination of these drugs with anesthesia                May have life threatening  results.  __X__  7.  Bring all medications with you on the day of surgery if instructed.   __X__  8.  Notify your doctor if there is any change in your medical condition      (cold, fever, infections).     Do not wear jewelry, make-up, hairpins, clips or nail polish. Do not wear lotions, powders, or perfumes. You may wear deodorant. Do not shave 48 hours prior to surgery. Men may shave face and neck. Do not bring valuables to the hospital.    [facility] is not responsible for any belongings or valuables.  Contacts, dentures or bridgework may not be worn  into surgery. Leave your suitcase in the car. After surgery it may be brought to your room. For patients admitted to the hospital, discharge time is determined by your treatment team.   Patients discharged the day of surgery will not be allowed to drive home.   Make arrangements for someone to be with you for the first 24 hours of your Same Day Discharge.    Please read over the following fact sheets that you were given:   Total Joint Packet    __X__ Take these medicines the morning of surgery with A SIP OF WATER:    1. diltiazem (CARDIZEM CD) 120 MG  2. metoprolol succinate (TOPROL-XL) 50 MG  3. omeprazole (PRILOSEC) 40 MG   5.   6.  ____ Fleet Enema (as directed)   __X__ Use CHG Soap (or wipes) as directed  ____ Use Benzoyl Peroxide Gel as instructed  ____ Use inhalers on the day of surgery  ____ Stop metformin 2 days prior to surgery    ____ Take 1/2 of usual insulin dose the night before surgery. No insulin the morning          of surgery.   __X__ Stop aspirin 81, 1 week before your procedure.  __X__ One Week prior to surgery- Stop Anti-inflammatories such as Ibuprofen, Aleve, Advil, Motrin, meloxicam (MOBIC), diclofenac, etodolac, ketorolac, Toradol, Daypro, piroxicam, Goody's or BC powders. OK TO USE TYLENOL IF NEEDED   __X__ Stop supplements until after surgery.    ____ Bring C-Pap to the hospital.     If you have any questions regarding your pre-procedure instructions,  Please call Pre-admit Testing at [phone].

## 2021-12-06 DIAGNOSIS — Z8679 Personal history of other diseases of the circulatory system: Principal | ICD-10-CM

## 2021-12-06 DIAGNOSIS — I10 Essential (primary) hypertension: Principal | ICD-10-CM

## 2021-12-08 DIAGNOSIS — C9001 Multiple myeloma in remission: Principal | ICD-10-CM

## 2021-12-08 MED ORDER — LENALIDOMIDE 10 MG CAPSULE
ORAL_CAPSULE | Freq: Every day | ORAL | 0 refills | 28.00000 days | Status: CP
Start: 2021-12-08 — End: 2022-01-05

## 2021-12-12 ENCOUNTER — Other Ambulatory Visit: Payer: Self-pay

## 2021-12-12 ENCOUNTER — Other Ambulatory Visit
Admission: RE | Admit: 2021-12-12 | Discharge: 2021-12-12 | Disposition: A | Discharge status: Home / Self Care | Payer: Medicare PPO | Source: Ambulatory Visit | Attending: Orthopedic Surgery | Admitting: Orthopedic Surgery

## 2021-12-12 DIAGNOSIS — Z01812 Encounter for preprocedural laboratory examination: Secondary | ICD-10-CM | POA: Insufficient documentation

## 2021-12-12 DIAGNOSIS — Z20822 Contact with and (suspected) exposure to covid-19: Secondary | ICD-10-CM | POA: Insufficient documentation

## 2021-12-13 ENCOUNTER — Ambulatory Visit: Admit: 2021-12-13 | Discharge: 2021-12-13 | Payer: MEDICARE

## 2021-12-13 LAB — SARS CORONAVIRUS 2 (TAT 6-24 HRS): SARS Coronavirus 2: NEGATIVE

## 2021-12-13 MED ORDER — CELECOXIB 200 MG PO CAPS: 400.0000 mg | ORAL_CAPSULE | Freq: Once | ORAL | Status: DC

## 2021-12-13 MED ORDER — CHLORHEXIDINE GLUCONATE 4 % EX LIQD: 60.0000 mL | Freq: Once | CUTANEOUS | Status: DC

## 2021-12-13 MED ORDER — CEFAZOLIN SODIUM-DEXTROSE 2-4 GM/100ML-% IV SOLN: 2.0000 g | INTRAVENOUS | Status: DC

## 2021-12-13 MED ORDER — CHLORHEXIDINE GLUCONATE 0.12 % MT SOLN: 15.0000 mL | Freq: Once | OROMUCOSAL | Status: DC

## 2021-12-13 MED ORDER — GABAPENTIN 300 MG PO CAPS: 300.0000 mg | ORAL_CAPSULE | Freq: Once | ORAL | Status: DC

## 2021-12-13 MED ORDER — DEXAMETHASONE SODIUM PHOSPHATE 10 MG/ML IJ SOLN: 8.0000 mg | Freq: Once | INTRAMUSCULAR | Status: DC

## 2021-12-13 MED ORDER — LACTATED RINGERS IV SOLN: INTRAVENOUS | Status: DC

## 2021-12-13 MED ORDER — TRANEXAMIC ACID-NACL 1000-0.7 MG/100ML-% IV SOLN: 1000.0000 mg | INTRAVENOUS | Status: DC

## 2021-12-13 MED ORDER — ORAL CARE MOUTH RINSE: 15.0000 mL | Freq: Once | OROMUCOSAL | Status: DC

## 2021-12-13 NOTE — H&P (Signed)
ORTHOPAEDIC HISTORY & PHYSICAL Gwenlyn Fudge, [place] - 12/06/2021 10:45 AM EST Formatting of this note is different from the original. [facility] Chief Complaint:   Chief Complaint  Patient presents with   Hip Pain  H & P LEFT HIP   History of Present Illness:   Miguel Rios is a 76 y.o. male that presents to clinic today for his preoperative history and evaluation. Patient presents unaccompanied. The patient is scheduled to undergo a left total hip arthroplasty on 12/14/21 by Dr. Marry Guan. His pain began several years ago and is increased over the last several months. The pain is located in the left hip. He describes his pain as worse with moving from a sitting to standing position or attempting to get to his car. He denies associated numbness or tingling.   The patient's symptoms have progressed to the point that they decrease his quality of life. The patient has previously undergone conservative treatment including NSAIDS and activity modification without adequate control of his symptoms.  Patient denies significant cardiac history, history of blood clots, or history of lumbar surgery. Patient does have history of Multiple Myeloma and takes Revlimid daily. He states he stopped taking the Revlimid a week ago. No known drug allergies.  Last A1c was 5.3 taken on 12/05/2021.  Past Medical, Surgical, Family, Social History, Allergies, Medications:   Past Medical History:  Past Medical History:  Diagnosis Date   BPH (benign prostatic hyperplasia)   Glucose intolerance  May 2019: HbA1c 6.2.   H/O autologous stem cell transplant (CMS-HCC)   HTN (hypertension)   Multiple myeloma (CMS-HCC)   Past Surgical History:  Past Surgical History:  Procedure Laterality Date   ARTHROPLASTY, ACETABULAR & PROXIMAL FEMORAL PROSTHETIC REPLACEMENT (TOTAL HIP), W/WO AUTOGRAFT/ALLOGRAFT Right 10/14/2015  Surgeon: Lorie Phenix Rozelle Logan, MD   Right [facility]  [facility]   Repair direct and indirect inguinal hernia with mesh. Procedure: implantation of mesh. Left 05/07/2017  Surgeon: Fernande Boyden, MD   COLONOSCOPY, FLEXIBLE, PROXIMAL TO SPLENIC FLEXURE; WITH BIOPSY, SINGLE OR MULTIPLE 01/01/2018  Surgeon: Renaldo Fiddler, MD   VARICOSE VEIN SURGERY Right   Current Medications:  Current Outpatient Medications  Medication Sig Dispense Refill   aspirin 81 MG EC tablet Take 81 mg by mouth once daily   calcium carbonate (TUMS ULTRA) 400 mg calcium (1,000 mg) chewable tablet Take 800 mg of elemental by mouth 3 (three) times daily as needed   cholecalciferol (VITAMIN D3) 1000 unit tablet Take 1,000 Units by mouth once daily   cholestyramine (QUESTRAN) 4 gram oral powder packet MIX 1 PACKET WITH 2-3 OUNCES OF FLUID AND TAKE BY MOUTH TWICE DAILY WITH MEALS   fluticasone propionate (FLONASE) 50 mcg/actuation nasal spray Place 2 sprays into both nostrils as needed   lenalidomide (REVLIMID) 10 mg capsule Take 10 mg by mouth once daily   loratadine (CLARITIN) 10 mg tablet Take 10 mg by mouth once daily   metoprolol succinate (TOPROL-XL) 50 MG XL tablet Take by mouth   multivitamin with minerals tablet Take 1 tablet by mouth once daily   omeprazole (PRILOSEC) 40 MG DR capsule Take 40 mg by mouth once daily   SPIRIVA RESPIMAT 2.5 mcg/actuation inhalation spray Inhale 5 mcg into the lungs once daily as needed   tamsulosin (FLOMAX) 0.4 mg capsule Take 0.4 mg by mouth once daily   valACYclovir (VALTREX) 500 MG tablet Take 500 mg by mouth 2 (  two) times daily   diltiazem (CARDIZEM CD) 120 MG XR capsule Take 120 mg by mouth once daily   No current facility-administered medications for this visit.   Allergies: No Known Allergies  Social History:  Social History   Socioeconomic History   Marital status: Married  Spouse name: Wells Guiles   Number of children: 2   Years of education: 16   Highest education  level: Bachelor's degree (e.g., BA, AB, BS)  Occupational History   Occupation: Retired Regulatory affairs officer  Tobacco Use   Smoking status: Never   Smokeless tobacco: Never  Vaping Use   Vaping Use: Never used  Substance and Sexual Activity   Alcohol use: Not Currently  Comment: 2 drinks a month   Drug use: No   Sexual activity: Yes  Partners: Female   Family History:  Family History  Problem Relation Age of Onset   Dementia Father   Review of Systems:   A 10+ ROS was performed, reviewed, and the pertinent orthopaedic findings are documented in the HPI.   Physical Examination:   BP (!) 140/80 (BP Location: Left upper arm, Patient Position: Sitting, BP Cuff Size: Large Adult)   Ht 200.7 cm (_0 )   Wt (!) 124.4 kg (274 lb 3.2 oz)   BMI 30.89 kg/m   Patient is a well-developed, well-nourished male in no acute distress. Patient has normal mood and affect. Patient is alert and oriented to person, place, and time.   HEENT: Atraumatic, normocephalic. Pupils equal and reactive to light. Extraocular motion intact. Noninjected sclera.  Cardiovascular: Regular rate and rhythm, with no murmurs, rubs, or gallops. Distal pulses auscultated with Doppler.  Respiratory: Lungs clear to auscultation bilaterally.   Left Hip: Pelvic tilt: Negative Limb lengths: Equal with the patient standing Soft tissue swelling: Negative Erythema: Negative Crepitance: Negative Tenderness: Greater trochanter is nontender to palpation. Mild pain is elicited by axial compression or extremes of rotation. Atrophy: No atrophy. Fair to good hip flexor and abductor strength. Range of Motion: EXT/FLEX: 0/0/90 ADD/ABD: 20/0/20 IR/ER: 10/0/20   Sensation is intact over the saphenous, lateral cutaneous, superficial fibular, and deep fibular nerve distributions.  Tests Performed/Reviewed:  X-rays  Anteroposterior view of the pelvis as well as anteroposterior and lateral views of the left hip were obtained. Images  reveal severe loss of femoroacetabular joint space with associated osteophyte formation of the femoral head. No fractures or dislocations. No osseous abnormality.  I personally ordered and interpreted today's x-rays.  Impression:   ICD-10-CM  1. Primary osteoarthritis of left hip M16.12  2. 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 Followed by specialist. Continue follow-up as recommended.  Plan:   The patient has end-stage degenerative changes of the left hip. It was explained to the patient that the condition is progressive in nature. Having failed conservative treatment, the patient has elected to proceed with a total joint arthroplasty. The patient will undergo a total joint arthroplasty with Dr. Marry Guan. The risks of surgery, including blood clot and infection, were discussed with the patient. Measures to reduce these risks, including the use of anticoagulation, perioperative antibiotics, and early ambulation were discussed. The importance of postoperative physical therapy was discussed with the patient. The patient elects to proceed with surgery. The patient is instructed to stop all blood thinners  prior to surgery. The patient is instructed to call the hospital the day before surgery to learn of the proper arrival time.   Contact our office with any questions or concerns. Follow up as indicated, or sooner should any new problems arise, if conditions worsen, or if they are otherwise concerned.   Gwenlyn Fudge, PA-C [facility] [place], [place] [address] Phone: [phone]  This note was generated in part with voice recognition software and I apologize for any typographical errors that were not detected and  corrected.   Electronically signed by Gwenlyn Fudge, PA at 12/12/2021 10:24 AM EST

## 2021-12-14 ENCOUNTER — Other Ambulatory Visit: Payer: Self-pay

## 2021-12-14 ENCOUNTER — Ambulatory Visit: Payer: Medicare PPO | Admitting: Anesthesiology

## 2021-12-14 ENCOUNTER — Observation Stay
Admission: RE | Admit: 2021-12-14 | Discharge: 2021-12-16 | Disposition: A | Discharge status: Home / Self Care | Payer: Medicare PPO | Attending: Orthopedic Surgery | Admitting: Orthopedic Surgery

## 2021-12-14 ENCOUNTER — Observation Stay: Payer: Medicare PPO

## 2021-12-14 ENCOUNTER — Encounter: Payer: Self-pay | Admitting: Orthopedic Surgery

## 2021-12-14 ENCOUNTER — Encounter
Admission: RE | Disposition: A | Discharge status: Home / Self Care | Payer: Self-pay | Source: Home / Self Care | Attending: Orthopedic Surgery

## 2021-12-14 DIAGNOSIS — E7439 Other disorders of intestinal carbohydrate absorption: Secondary | ICD-10-CM

## 2021-12-14 DIAGNOSIS — Z7982 Long term (current) use of aspirin: Secondary | ICD-10-CM | POA: Insufficient documentation

## 2021-12-14 DIAGNOSIS — E11621 Type 2 diabetes mellitus with foot ulcer: Secondary | ICD-10-CM | POA: Diagnosis not present

## 2021-12-14 DIAGNOSIS — Z96641 Presence of right artificial hip joint: Secondary | ICD-10-CM | POA: Insufficient documentation

## 2021-12-14 DIAGNOSIS — Z79899 Other long term (current) drug therapy: Secondary | ICD-10-CM | POA: Insufficient documentation

## 2021-12-14 DIAGNOSIS — C9000 Multiple myeloma not having achieved remission: Secondary | ICD-10-CM | POA: Diagnosis not present

## 2021-12-14 DIAGNOSIS — Z96649 Presence of unspecified artificial hip joint: Secondary | ICD-10-CM

## 2021-12-14 DIAGNOSIS — Z96642 Presence of left artificial hip joint: Secondary | ICD-10-CM

## 2021-12-14 DIAGNOSIS — I10 Essential (primary) hypertension: Secondary | ICD-10-CM | POA: Diagnosis not present

## 2021-12-14 DIAGNOSIS — L97521 Non-pressure chronic ulcer of other part of left foot limited to breakdown of skin: Secondary | ICD-10-CM | POA: Insufficient documentation

## 2021-12-14 DIAGNOSIS — M1612 Unilateral primary osteoarthritis, left hip: Secondary | ICD-10-CM | POA: Diagnosis not present

## 2021-12-14 DIAGNOSIS — Z9484 Stem cells transplant status: Secondary | ICD-10-CM

## 2021-12-14 DIAGNOSIS — C9001 Multiple myeloma in remission: Secondary | ICD-10-CM

## 2021-12-14 HISTORY — PX: TOTAL HIP ARTHROPLASTY: SHX124

## 2021-12-14 LAB — ABO/RH: ABO/RH(D): A POS

## 2021-12-14 SURGERY — ARTHROPLASTY, HIP, TOTAL,POSTERIOR APPROACH
Anesthesia: General | Site: Hip | Laterality: Left

## 2021-12-14 MED ORDER — METOPROLOL SUCCINATE ER 50 MG TABLET,EXTENDED RELEASE 24 HR
ORAL_TABLET | Freq: Every day | ORAL | 11 refills | 30.00000 days | Status: CP
Start: 2021-12-14 — End: 2022-12-09

## 2021-12-14 MED ORDER — EPHEDRINE 5 MG/ML INJ
INTRAVENOUS | Status: AC
  Filled 2021-12-14: qty 5

## 2021-12-14 MED ORDER — FENTANYL CITRATE (PF) 100 MCG/2ML IJ SOLN
INTRAMUSCULAR | Status: AC
  Filled 2021-12-14: qty 2

## 2021-12-14 MED ORDER — SENNOSIDES-DOCUSATE SODIUM 8.6-50 MG PO TABS
1.0000 | ORAL_TABLET | Freq: Two times a day (BID) | ORAL | Status: DC
  Administered 2021-12-14 – 2021-12-16 (×4): 1 via ORAL
  Filled 2021-12-14 (×4): qty 1

## 2021-12-14 MED ORDER — TRAMADOL HCL 50 MG PO TABS: 50.0000 mg | ORAL_TABLET | ORAL | Status: DC | PRN

## 2021-12-14 MED ORDER — 0.9 % SODIUM CHLORIDE (POUR BTL) OPTIME
TOPICAL | Status: DC | PRN
  Administered 2021-12-14: 200 mL

## 2021-12-14 MED ORDER — BUPIVACAINE LIPOSOME 1.3 % IJ SUSP
INTRAMUSCULAR | Status: AC
  Filled 2021-12-14: qty 20

## 2021-12-14 MED ORDER — DEXMEDETOMIDINE HCL IN NACL 200 MCG/50ML IV SOLN
INTRAVENOUS | Status: AC
  Filled 2021-12-14: qty 50

## 2021-12-14 MED ORDER — CELECOXIB 200 MG PO CAPS
200.0000 mg | ORAL_CAPSULE | Freq: Two times a day (BID) | ORAL | Status: DC
  Administered 2021-12-14 – 2021-12-16 (×4): 200 mg via ORAL
  Filled 2021-12-14 (×4): qty 1

## 2021-12-14 MED ORDER — LACTATED RINGERS IV SOLN
INTRAVENOUS | Status: DC | PRN
Start: 2021-12-14 — End: 2021-12-14

## 2021-12-14 MED ORDER — CEFAZOLIN SODIUM-DEXTROSE 2-4 GM/100ML-% IV SOLN
INTRAVENOUS | Status: AC
  Filled 2021-12-14: qty 100

## 2021-12-14 MED ORDER — SODIUM CHLORIDE 0.9 % IV SOLN: INTRAVENOUS | Status: DC

## 2021-12-14 MED ORDER — VITAMIN D 25 MCG (1000 UNIT) PO TABS
1000.0000 [IU] | ORAL_TABLET | Freq: Every day | ORAL | Status: DC
  Administered 2021-12-15 – 2021-12-16 (×2): 1000 [IU] via ORAL
  Filled 2021-12-14 (×2): qty 1

## 2021-12-14 MED ORDER — CHLORHEXIDINE GLUCONATE 0.12 % MT SOLN
OROMUCOSAL | Status: AC
  Administered 2021-12-14: 15 mL via OROMUCOSAL
  Filled 2021-12-14: qty 15

## 2021-12-14 MED ORDER — PROPOFOL 10 MG/ML IV BOLUS
INTRAVENOUS | Status: AC
  Filled 2021-12-14: qty 20

## 2021-12-14 MED ORDER — HYDROMORPHONE HCL 1 MG/ML IJ SOLN: 0.5000 mg | INTRAMUSCULAR | Status: DC | PRN

## 2021-12-14 MED ORDER — ONDANSETRON HCL 4 MG PO TABS: 4.0000 mg | ORAL_TABLET | Freq: Four times a day (QID) | ORAL | Status: DC | PRN

## 2021-12-14 MED ORDER — MAGNESIUM HYDROXIDE 400 MG/5ML PO SUSP
30.0000 mL | Freq: Every day | ORAL | Status: DC
  Filled 2021-12-14 (×3): qty 30

## 2021-12-14 MED ORDER — FERROUS SULFATE 325 (65 FE) MG PO TABS
325.0000 mg | ORAL_TABLET | Freq: Two times a day (BID) | ORAL | Status: DC
  Administered 2021-12-14 – 2021-12-16 (×4): 325 mg via ORAL
  Filled 2021-12-14 (×4): qty 1

## 2021-12-14 MED ORDER — ALUM & MAG HYDROXIDE-SIMETH 200-200-20 MG/5ML PO SUSP: 30.0000 mL | ORAL | Status: DC | PRN

## 2021-12-14 MED ORDER — OXYCODONE HCL 5 MG PO TABS
5.0000 mg | ORAL_TABLET | ORAL | Status: DC | PRN
  Administered 2021-12-15 – 2021-12-16 (×2): 5 mg via ORAL
  Filled 2021-12-14 (×2): qty 1

## 2021-12-14 MED ORDER — TAMSULOSIN HCL 0.4 MG PO CAPS
0.4000 mg | ORAL_CAPSULE | Freq: Every day | ORAL | Status: DC
  Administered 2021-12-14 – 2021-12-16 (×3): 0.4 mg via ORAL
  Filled 2021-12-14 (×3): qty 1

## 2021-12-14 MED ORDER — LIDOCAINE HCL (PF) 2 % IJ SOLN
INTRAMUSCULAR | Status: AC
  Filled 2021-12-14: qty 5

## 2021-12-14 MED ORDER — ACETAMINOPHEN 10 MG/ML IV SOLN: 1000.0000 mg | Freq: Four times a day (QID) | INTRAVENOUS | Status: DC

## 2021-12-14 MED ORDER — ONDANSETRON HCL 4 MG/2ML IJ SOLN: 4.0000 mg | Freq: Four times a day (QID) | INTRAMUSCULAR | Status: DC | PRN

## 2021-12-14 MED ORDER — CEFAZOLIN SODIUM-DEXTROSE 2-4 GM/100ML-% IV SOLN
2.0000 g | Freq: Four times a day (QID) | INTRAVENOUS | Status: AC
  Administered 2021-12-14 (×2): 2 g via INTRAVENOUS
  Filled 2021-12-14 (×2): qty 100

## 2021-12-14 MED ORDER — PANTOPRAZOLE SODIUM 40 MG PO TBEC
40.0000 mg | DELAYED_RELEASE_TABLET | Freq: Two times a day (BID) | ORAL | Status: DC
  Administered 2021-12-15 – 2021-12-16 (×3): 40 mg via ORAL
  Filled 2021-12-14 (×4): qty 1

## 2021-12-14 MED ORDER — GLYCOPYRROLATE 0.2 MG/ML IJ SOLN
INTRAMUSCULAR | Status: DC | PRN
  Administered 2021-12-14: .2 mg via INTRAVENOUS

## 2021-12-14 MED ORDER — PRONTOSAN WOUND IRRIGATION OPTIME
TOPICAL | Status: DC | PRN
  Administered 2021-12-14: 1

## 2021-12-14 MED ORDER — CELECOXIB 200 MG PO CAPS
ORAL_CAPSULE | ORAL | Status: AC
  Administered 2021-12-14: 400 mg via ORAL
  Filled 2021-12-14: qty 2

## 2021-12-14 MED ORDER — ACETAMINOPHEN 10 MG/ML IV SOLN
INTRAVENOUS | Status: DC | PRN
Start: 2021-12-14 — End: 2021-12-14
  Administered 2021-12-14: 1000 mg via INTRAVENOUS

## 2021-12-14 MED ORDER — OXYCODONE HCL 5 MG/5ML PO SOLN: 5.0000 mg | Freq: Once | ORAL | Status: DC | PRN

## 2021-12-14 MED ORDER — PHENYLEPHRINE HCL (PRESSORS) 10 MG/ML IV SOLN
INTRAVENOUS | Status: DC | PRN
  Administered 2021-12-14 (×10): 80 ug via INTRAVENOUS

## 2021-12-14 MED ORDER — VALACYCLOVIR HCL 500 MG PO TABS
500.0000 mg | ORAL_TABLET | Freq: Every day | ORAL | Status: DC
  Administered 2021-12-14 – 2021-12-16 (×3): 500 mg via ORAL
  Filled 2021-12-14 (×3): qty 1

## 2021-12-14 MED ORDER — ONDANSETRON HCL 4 MG/2ML IJ SOLN
INTRAMUSCULAR | Status: DC | PRN
  Administered 2021-12-14: 4 mg via INTRAVENOUS

## 2021-12-14 MED ORDER — OXYCODONE HCL 5 MG PO TABS: 5.0000 mg | ORAL_TABLET | Freq: Once | ORAL | Status: DC | PRN

## 2021-12-14 MED ORDER — LENALIDOMIDE 10 MG PO CAPS: 10.0000 mg | ORAL_CAPSULE | Freq: Every day | ORAL | Status: DC

## 2021-12-14 MED ORDER — CEFAZOLIN SODIUM 1 G IJ SOLR
INTRAMUSCULAR | Status: AC
  Filled 2021-12-14: qty 10

## 2021-12-14 MED ORDER — FENTANYL CITRATE (PF) 100 MCG/2ML IJ SOLN
INTRAMUSCULAR | Status: DC | PRN
  Administered 2021-12-14 (×4): 50 ug via INTRAVENOUS

## 2021-12-14 MED ORDER — MIDAZOLAM HCL 2 MG/2ML IJ SOLN
INTRAMUSCULAR | Status: AC
  Filled 2021-12-14: qty 2

## 2021-12-14 MED ORDER — ONDANSETRON HCL 4 MG/2ML IJ SOLN
INTRAMUSCULAR | Status: AC
  Filled 2021-12-14: qty 2

## 2021-12-14 MED ORDER — TRANEXAMIC ACID-NACL 1000-0.7 MG/100ML-% IV SOLN
INTRAVENOUS | Status: AC
  Administered 2021-12-14: 1000 mg via INTRAVENOUS
  Filled 2021-12-14: qty 100

## 2021-12-14 MED ORDER — ONDANSETRON HCL 4 MG/2ML IJ SOLN: 4.0000 mg | Freq: Once | INTRAMUSCULAR | Status: DC | PRN

## 2021-12-14 MED ORDER — DIPHENHYDRAMINE HCL 12.5 MG/5ML PO ELIX: 12.5000 mg | ORAL_SOLUTION | ORAL | Status: DC | PRN

## 2021-12-14 MED ORDER — ACETAMINOPHEN 325 MG PO TABS
325.0000 mg | ORAL_TABLET | Freq: Four times a day (QID) | ORAL | Status: DC | PRN
  Administered 2021-12-15: 650 mg via ORAL
  Filled 2021-12-14 (×2): qty 2

## 2021-12-14 MED ORDER — GLYCOPYRROLATE 0.2 MG/ML IJ SOLN
INTRAMUSCULAR | Status: AC
  Filled 2021-12-14: qty 1

## 2021-12-14 MED ORDER — HYDROMORPHONE HCL 1 MG/ML IJ SOLN
INTRAMUSCULAR | Status: AC
  Filled 2021-12-14: qty 1

## 2021-12-14 MED ORDER — DEXAMETHASONE SODIUM PHOSPHATE 10 MG/ML IJ SOLN
INTRAMUSCULAR | Status: DC | PRN
  Administered 2021-12-14: 10 mg via INTRAVENOUS

## 2021-12-14 MED ORDER — TRANEXAMIC ACID-NACL 1000-0.7 MG/100ML-% IV SOLN: 1000.0000 mg | Freq: Once | INTRAVENOUS | Status: AC

## 2021-12-14 MED ORDER — OXYCODONE HCL 5 MG PO TABS
10.0000 mg | ORAL_TABLET | ORAL | Status: DC | PRN
  Administered 2021-12-14 – 2021-12-16 (×2): 10 mg via ORAL
  Filled 2021-12-14 (×2): qty 2

## 2021-12-14 MED ORDER — BUPIVACAINE-EPINEPHRINE (PF) 0.25% -1:200000 IJ SOLN
INTRAMUSCULAR | Status: AC
  Filled 2021-12-14: qty 60

## 2021-12-14 MED ORDER — DILTIAZEM HCL ER COATED BEADS 120 MG PO CP24
120.0000 mg | ORAL_CAPSULE | Freq: Every day | ORAL | Status: DC
  Administered 2021-12-15 – 2021-12-16 (×2): 120 mg via ORAL
  Filled 2021-12-14 (×2): qty 1

## 2021-12-14 MED ORDER — FLUTICASONE PROPIONATE 50 MCG/ACT NA SUSP
1.0000 | Freq: Every day | NASAL | Status: DC
  Administered 2021-12-16: 1 via NASAL
  Filled 2021-12-14: qty 16

## 2021-12-14 MED ORDER — SODIUM CHLORIDE FLUSH 0.9 % IV SOLN
INTRAVENOUS | Status: AC
  Filled 2021-12-14: qty 40

## 2021-12-14 MED ORDER — HYDROMORPHONE HCL 1 MG/ML IJ SOLN
INTRAMUSCULAR | Status: DC | PRN
  Administered 2021-12-14 (×2): .25 mg via INTRAVENOUS
  Administered 2021-12-14: .5 mg via INTRAVENOUS

## 2021-12-14 MED ORDER — PHENOL 1.4 % MT LIQD
1.0000 | OROMUCOSAL | Status: DC | PRN
  Filled 2021-12-14: qty 177

## 2021-12-14 MED ORDER — MIDAZOLAM HCL 5 MG/5ML IJ SOLN
INTRAMUSCULAR | Status: DC | PRN
  Administered 2021-12-14 (×2): 1 mg via INTRAVENOUS

## 2021-12-14 MED ORDER — DEXMEDETOMIDINE (PRECEDEX) IN NS 20 MCG/5ML (4 MCG/ML) IV SYRINGE
PREFILLED_SYRINGE | INTRAVENOUS | Status: DC | PRN
  Administered 2021-12-14 (×2): 8 ug via INTRAVENOUS

## 2021-12-14 MED ORDER — DEXAMETHASONE SODIUM PHOSPHATE 10 MG/ML IJ SOLN
INTRAMUSCULAR | Status: AC
  Filled 2021-12-14: qty 1

## 2021-12-14 MED ORDER — DEXAMETHASONE SODIUM PHOSPHATE 10 MG/ML IJ SOLN
INTRAMUSCULAR | Status: AC
  Administered 2021-12-14: 8 mg via INTRAVENOUS
  Filled 2021-12-14: qty 1

## 2021-12-14 MED ORDER — CHOLESTYRAMINE 4 G PO PACK
4.0000 g | PACK | Freq: Every day | ORAL | Status: DC
  Administered 2021-12-15 – 2021-12-16 (×2): 4 g via ORAL
  Filled 2021-12-14 (×2): qty 1

## 2021-12-14 MED ORDER — LORATADINE 10 MG PO TABS
10.0000 mg | ORAL_TABLET | Freq: Every day | ORAL | Status: DC
  Administered 2021-12-15 – 2021-12-16 (×2): 10 mg via ORAL
  Filled 2021-12-14 (×3): qty 1

## 2021-12-14 MED ORDER — FLEET ENEMA 7-19 GM/118ML RE ENEM: 1.0000 | ENEMA | Freq: Once | RECTAL | Status: DC | PRN

## 2021-12-14 MED ORDER — ENOXAPARIN SODIUM 30 MG/0.3ML IJ SOSY
30.0000 mg | PREFILLED_SYRINGE | Freq: Two times a day (BID) | INTRAMUSCULAR | Status: DC
  Administered 2021-12-15 – 2021-12-16 (×3): 30 mg via SUBCUTANEOUS
  Filled 2021-12-14 (×3): qty 0.3

## 2021-12-14 MED ORDER — ACETAMINOPHEN 500 MG PO TABS
1000.0000 mg | ORAL_TABLET | Freq: Four times a day (QID) | ORAL | Status: AC
  Administered 2021-12-14 – 2021-12-15 (×4): 1000 mg via ORAL
  Filled 2021-12-14 (×4): qty 2

## 2021-12-14 MED ORDER — SODIUM CHLORIDE 0.9 % IR SOLN
Status: DC | PRN
  Administered 2021-12-14: 3000 mL

## 2021-12-14 MED ORDER — BISACODYL 10 MG RE SUPP: 10.0000 mg | Freq: Every day | RECTAL | Status: DC | PRN

## 2021-12-14 MED ORDER — ROCURONIUM BROMIDE 10 MG/ML (PF) SYRINGE
PREFILLED_SYRINGE | INTRAVENOUS | Status: AC
  Filled 2021-12-14: qty 20

## 2021-12-14 MED ORDER — ACETAMINOPHEN 10 MG/ML IV SOLN
INTRAVENOUS | Status: AC
  Filled 2021-12-14: qty 100

## 2021-12-14 MED ORDER — SUGAMMADEX SODIUM 200 MG/2ML IV SOLN
INTRAVENOUS | Status: DC | PRN
Start: 2021-12-14 — End: 2021-12-14
  Administered 2021-12-14: 300 mg via INTRAVENOUS

## 2021-12-14 MED ORDER — PROPOFOL 10 MG/ML IV BOLUS
INTRAVENOUS | Status: DC | PRN
  Administered 2021-12-14: 200 mg via INTRAVENOUS

## 2021-12-14 MED ORDER — FENTANYL CITRATE (PF) 100 MCG/2ML IJ SOLN: 25.0000 ug | INTRAMUSCULAR | Status: DC | PRN

## 2021-12-14 MED ORDER — GABAPENTIN 300 MG PO CAPS
ORAL_CAPSULE | ORAL | Status: AC
  Administered 2021-12-14: 300 mg via ORAL
  Filled 2021-12-14: qty 1

## 2021-12-14 MED ORDER — METOPROLOL SUCCINATE ER 50 MG PO TB24
50.0000 mg | ORAL_TABLET | Freq: Every day | ORAL | Status: DC
  Administered 2021-12-15 – 2021-12-16 (×2): 50 mg via ORAL
  Filled 2021-12-14 (×2): qty 1

## 2021-12-14 MED ORDER — ROCURONIUM BROMIDE 100 MG/10ML IV SOLN
INTRAVENOUS | Status: DC | PRN
  Administered 2021-12-14: 20 mg via INTRAVENOUS
  Administered 2021-12-14: 30 mg via INTRAVENOUS
  Administered 2021-12-14: 100 mg via INTRAVENOUS

## 2021-12-14 MED ORDER — MENTHOL 3 MG MT LOZG
1.0000 | LOZENGE | OROMUCOSAL | Status: DC | PRN
  Filled 2021-12-14: qty 9

## 2021-12-14 MED ORDER — METOCLOPRAMIDE HCL 10 MG PO TABS
10.0000 mg | ORAL_TABLET | Freq: Three times a day (TID) | ORAL | Status: DC
  Administered 2021-12-14 – 2021-12-16 (×7): 10 mg via ORAL
  Filled 2021-12-14 (×8): qty 1

## 2021-12-14 MED ORDER — ENSURE PRE-SURGERY PO LIQD
296.0000 mL | Freq: Once | ORAL | Status: AC
  Administered 2021-12-14: 296 mL via ORAL
  Filled 2021-12-14: qty 296

## 2021-12-14 MED ORDER — ACETAMINOPHEN 10 MG/ML IV SOLN: 1000.0000 mg | Freq: Once | INTRAVENOUS | Status: DC | PRN

## 2021-12-14 MED ORDER — TRANEXAMIC ACID-NACL 1000-0.7 MG/100ML-% IV SOLN
INTRAVENOUS | Status: DC | PRN
Start: 2021-12-14 — End: 2021-12-14
  Administered 2021-12-14: 1000 mg via INTRAVENOUS

## 2021-12-14 MED ORDER — TRANEXAMIC ACID-NACL 1000-0.7 MG/100ML-% IV SOLN
INTRAVENOUS | Status: AC
  Filled 2021-12-14: qty 100

## 2021-12-14 MED ORDER — CALCIUM CARBONATE ANTACID 500 MG PO CHEW
1000.0000 mg | CHEWABLE_TABLET | Freq: Three times a day (TID) | ORAL | Status: DC
  Administered 2021-12-14 – 2021-12-16 (×6): 1000 mg via ORAL
  Filled 2021-12-14 (×5): qty 5

## 2021-12-14 MED ORDER — DEXTROSE 5 % IV SOLN
INTRAVENOUS | Status: DC | PRN
  Administered 2021-12-14: 3 g via INTRAVENOUS

## 2021-12-14 MED ORDER — EPHEDRINE SULFATE 50 MG/ML IJ SOLN
INTRAMUSCULAR | Status: DC | PRN
  Administered 2021-12-14 (×3): 5 mg via INTRAVENOUS
  Administered 2021-12-14: 10 mg via INTRAVENOUS

## 2021-12-14 MED ORDER — PROPOFOL 1000 MG/100ML IV EMUL
INTRAVENOUS | Status: AC
  Filled 2021-12-14: qty 100

## 2021-12-14 MED ORDER — ADULT MULTIVITAMIN W/MINERALS CH
1.0000 | ORAL_TABLET | Freq: Every day | ORAL | Status: DC
  Administered 2021-12-15 – 2021-12-16 (×2): 1 via ORAL
  Filled 2021-12-14 (×2): qty 1

## 2021-12-14 SURGICAL SUPPLY — 61 items
ARTICULEZE HEAD (Hips) ×2 IMPLANT
BLADE DRUM FLTD (BLADE) ×2 IMPLANT
BLADE SAW 90X25X1.19 OSCILLAT (BLADE) ×2 IMPLANT
CARTRIDGE OIL MAESTRO DRILL (MISCELLANEOUS) ×1 IMPLANT
DIFFUSER DRILL AIR PNEUMATIC (MISCELLANEOUS) ×2 IMPLANT
DRAPE 3/4 80X56 (DRAPES) ×2 IMPLANT
DRAPE INCISE IOBAN 66X60 STRL (DRAPES) ×2 IMPLANT
DRSG DERMACEA 8X12 NADH (GAUZE/BANDAGES/DRESSINGS) ×2 IMPLANT
DRSG MEPILEX SACRM 8.7X9.8 (GAUZE/BANDAGES/DRESSINGS) ×2 IMPLANT
DRSG OPSITE POSTOP 4X12 (GAUZE/BANDAGES/DRESSINGS) ×1 IMPLANT
DRSG OPSITE POSTOP 4X14 (GAUZE/BANDAGES/DRESSINGS) ×1 IMPLANT
DRSG TEGADERM 4X4.75 (GAUZE/BANDAGES/DRESSINGS) ×2 IMPLANT
DURAPREP 26ML APPLICATOR (WOUND CARE) ×2 IMPLANT
ELECT CAUTERY BLADE 6.4 (BLADE) ×2 IMPLANT
ELECT REM PT RETURN 9FT ADLT (ELECTROSURGICAL) ×2
ELECTRODE REM PT RTRN 9FT ADLT (ELECTROSURGICAL) ×1 IMPLANT
GAUZE 4X4 16PLY ~~LOC~~+RFID DBL (SPONGE) ×1 IMPLANT
GLOVE SURG ENC MOIS LTX SZ7.5 (GLOVE) ×4 IMPLANT
GLOVE SURG ENC TEXT LTX SZ7.5 (GLOVE) ×4 IMPLANT
GLOVE SURG UNDER LTX SZ8 (GLOVE) ×2 IMPLANT
GLOVE SURG UNDER POLY LF SZ7.5 (GLOVE) ×2 IMPLANT
GOWN STRL REUS W/ TWL LRG LVL3 (GOWN DISPOSABLE) ×2 IMPLANT
GOWN STRL REUS W/ TWL XL LVL3 (GOWN DISPOSABLE) ×1 IMPLANT
GOWN STRL REUS W/TWL LRG LVL3 (GOWN DISPOSABLE) ×2
GOWN STRL REUS W/TWL XL LVL3 (GOWN DISPOSABLE) ×1
HEAD ARTICULEZE (Hips) IMPLANT
HEMOVAC 400CC 10FR (MISCELLANEOUS) ×2 IMPLANT
HOLDER FOLEY CATH W/STRAP (MISCELLANEOUS) ×2 IMPLANT
HOLSTER ELECTROSUGICAL PENCIL (MISCELLANEOUS) ×3 IMPLANT
IV NS IRRIG 3000ML ARTHROMATIC (IV SOLUTION) ×2 IMPLANT
KIT PEG BOARD PINK (KITS) ×2 IMPLANT
KIT TURNOVER KIT A (KITS) ×2 IMPLANT
LINER MARATHON NEUT +4X60X36 (Hips) ×1 IMPLANT
MANIFOLD NEPTUNE II (INSTRUMENTS) ×4 IMPLANT
NDL SAFETY ECLIPSE 18X1.5 (NEEDLE) ×1 IMPLANT
NEEDLE HYPO 18GX1.5 SHARP (NEEDLE)
NS IRRIG 500ML POUR BTL (IV SOLUTION) ×2 IMPLANT
OIL CARTRIDGE MAESTRO DRILL (MISCELLANEOUS) ×2
PACK HIP PROSTHESIS (MISCELLANEOUS) ×2 IMPLANT
PENCIL SMOKE EVACUATOR COATED (MISCELLANEOUS) ×2 IMPLANT
PIN SECT CUP 60MM (Hips) ×1 IMPLANT
PULSAVAC PLUS IRRIG FAN TIP (DISPOSABLE) ×2
SOL PREP PVP 2OZ (MISCELLANEOUS) ×2
SOLUTION PREP PVP 2OZ (MISCELLANEOUS) ×1 IMPLANT
SOLUTION PRONTOSAN WOUND 350ML (IRRIGATION / IRRIGATOR) IMPLANT
SPONGE DRAIN TRACH 4X4 STRL 2S (GAUZE/BANDAGES/DRESSINGS) ×2 IMPLANT
SPONGE T-LAP 18X18 ~~LOC~~+RFID (SPONGE) ×7 IMPLANT
STAPLER SKIN PROX 35W (STAPLE) ×2 IMPLANT
STEM AML 18.0 STD 6.3 5/8 STD (Hips) ×1 IMPLANT
SUT ETHIBOND #5 BRAIDED 30INL (SUTURE) ×2 IMPLANT
SUT VIC AB 0 CT1 36 (SUTURE) ×2 IMPLANT
SUT VIC AB 1 CT1 36 (SUTURE) ×4 IMPLANT
SUT VIC AB 2-0 CT1 27 (SUTURE) ×1
SUT VIC AB 2-0 CT1 TAPERPNT 27 (SUTURE) ×1 IMPLANT
SYR 20ML LL LF (SYRINGE) ×1 IMPLANT
TAPE CLOTH 3X10 WHT NS LF (GAUZE/BANDAGES/DRESSINGS) ×2 IMPLANT
TAPE TRANSPORE STRL 2 31045 (GAUZE/BANDAGES/DRESSINGS) ×2 IMPLANT
TIP FAN IRRIG PULSAVAC PLUS (DISPOSABLE) ×1 IMPLANT
TOWEL OR 17X26 4PK STRL BLUE (TOWEL DISPOSABLE) ×2 IMPLANT
TRAY FOLEY MTR SLVR 16FR STAT (SET/KITS/TRAYS/PACK) ×2 IMPLANT
WATER STERILE IRR 1000ML POUR (IV SOLUTION) ×1 IMPLANT

## 2021-12-14 NOTE — Plan of Care (Signed)
°  Problem: Education: Goal: Knowledge of General Education information will improve Description: Including pain rating scale, medication(s)/side effects and non-pharmacologic comfort measures Outcome: Progressing   Problem: Health Behavior/Discharge Planning: Goal: Ability to manage health-related needs will improve Outcome: Progressing   Problem: Clinical Measurements: Goal: Ability to maintain clinical measurements within normal limits will improve Outcome: Progressing Goal: Will remain free from infection Outcome: Progressing Goal: Diagnostic test results will improve Outcome: Progressing Goal: Respiratory complications will improve Outcome: Progressing Goal: Cardiovascular complication will be avoided Outcome: Progressing   Problem: Coping: Goal: Level of anxiety will decrease Outcome: Progressing   Problem: Elimination: Goal: Will not experience complications related to bowel motility Outcome: Progressing Goal: Will not experience complications related to urinary retention Outcome: Progressing   Problem: Pain Managment: Goal: General experience of comfort will improve Outcome: Progressing   Problem: Safety: Goal: Ability to remain free from injury will improve Outcome: Progressing   Problem: Skin Integrity: Goal: Risk for impaired skin integrity will decrease Outcome: Progressing   Problem: Education: Goal: Knowledge of the prescribed therapeutic regimen will improve Outcome: Progressing Goal: Understanding of discharge needs will improve Outcome: Progressing Goal: Individualized Educational Video(s) Outcome: Progressing   Problem: Activity: Goal: Ability to avoid complications of mobility impairment will improve Outcome: Progressing Goal: Ability to tolerate increased activity will improve Outcome: Progressing   Problem: Pain Management: Goal: Pain level will decrease with appropriate interventions Outcome: Progressing   Problem: Skin  Integrity: Goal: Will show signs of wound healing Outcome: Progressing

## 2021-12-14 NOTE — Anesthesia Preprocedure Evaluation (Signed)
Anesthesia Evaluation  Patient identified by MRN, date of birth, ID band Patient awake    Reviewed: Allergy & Precautions, NPO status , Patient's Chart, lab work & pertinent test results  History of Anesthesia Complications Negative for: history of anesthetic complications  Airway Mallampati: II  TM Distance: >3 FB Neck ROM: Full    Dental  (+) Teeth Intact, Implants, Caps   Pulmonary neg pulmonary ROS, neg sleep apnea, neg COPD, Patient abstained from smoking.Not current smoker,  Possible sleep apnea; was not able to complete sleep study due to not being able to fall asleep   Pulmonary exam normal breath sounds clear to auscultation       Cardiovascular Exercise Tolerance: Good METShypertension, (-) CAD and (-) Past MI (-) dysrhythmias  Rhythm:Regular Rate:Normal - Systolic murmurs  Hx post op afib after stem cell transplant, currently in sinus, never was on anticoagulation   Neuro/Psych negative neurological ROS  negative psych ROS   GI/Hepatic neg GERD  ,(+)     (-) substance abuse  ,   Endo/Other  neg diabetes  Renal/GU negative Renal ROS     Musculoskeletal  (+) Arthritis ,   Abdominal   Peds  Hematology Hx multiple myeloma s/p stem cell transplant   Anesthesia Other Findings Past Medical History: No date: Cancer ([place])     Comment:  multiple moyloma No date: Hypertension No date: Pre-diabetes  Reproductive/Obstetrics                             Anesthesia Physical Anesthesia Plan  ASA: 2  Anesthesia Plan: Spinal   Post-op Pain Management: Gabapentin PO (pre-op), Celebrex PO (pre-op) and Ofirmev IV (intra-op)   Induction: Intravenous  PONV Risk Score and Plan: 2 and Ondansetron, Dexamethasone, Propofol infusion, TIVA and Midazolam  Airway Management Planned: Natural Airway  Additional Equipment: None  Intra-op Plan:   Post-operative Plan:   Informed Consent: I  have reviewed the patients History and Physical, chart, labs and discussed the procedure including the risks, benefits and alternatives for the proposed anesthesia with the patient or authorized representative who has indicated his/her understanding and acceptance.       Plan Discussed with: CRNA and Surgeon  Anesthesia Plan Comments: (Discussed R/B/A of neuraxial anesthesia technique with patient: - rare risks of spinal/epidural hematoma, nerve damage, infection - Risk of PDPH - Risk of nausea and vomiting - Risk of conversion to general anesthesia and its associated risks, including sore throat, damage to lips/eyes/teeth/oropharynx, and rare risks such as cardiac and respiratory events. - Risk of allergic reactions  Discussed the role of CRNA in patient's perioperative care.  Patient voiced understanding.)        Anesthesia Quick Evaluation

## 2021-12-14 NOTE — Anesthesia Postprocedure Evaluation (Signed)
Anesthesia Post Note  Patient: Miguel Rios  Procedure(s) Performed: TOTAL HIP ARTHROPLASTY (Left: Hip)  Patient location during evaluation: PACU Anesthesia Type: General Level of consciousness: awake and alert Pain management: pain level controlled Vital Signs Assessment: post-procedure vital signs reviewed and stable Respiratory status: spontaneous breathing, nonlabored ventilation, respiratory function stable and patient connected to nasal cannula oxygen Cardiovascular status: blood pressure returned to baseline and stable Postop Assessment: no apparent nausea or vomiting Anesthetic complications: no   No notable events documented.   Last Vitals:  Vitals:   12/14/21 1405 12/14/21 1410  BP:    Pulse: 81 85  Resp: 11 16  Temp:    SpO2: 97% 96%    Last Pain:  Vitals:   12/14/21 0727  TempSrc: Temporal  PainSc: 0-No pain                 Arita Miss

## 2021-12-14 NOTE — Op Note (Signed)
OPERATIVE NOTE  DATE OF SURGERY:  12/14/2021  PATIENT NAME:  Miguel Rios   DOB: May 19, 1946  MRN: [id number]  PRE-OPERATIVE DIAGNOSIS: Degenerative arthrosis of the left hip, primary  POST-OPERATIVE DIAGNOSIS:  Same  PROCEDURE:  Left total hip arthroplasty  SURGEON:  Marciano Sequin. M.D.  ASSISTANT: Cassell Smiles, PA-C (present and scrubbed throughout the case, critical for assistance with exposure, retraction, instrumentation, and closure)  ANESTHESIA: general  ESTIMATED BLOOD LOSS: 250 mL  FLUIDS REPLACED: 1500 mL of crystalloid  DRAINS: 2 medium Hemovac drains  IMPLANTS UTILIZED: DePuy 18 mm large stature AML femoral stem, 60 mm OD Pinnacle 100 acetabular component, +4 mm neutral Pinnacle Marathon polyethylene insert, and a 36 mm M-SPEC +5 mm hip ball  INDICATIONS FOR SURGERY: Miguel Rios is a 76 y.o. year old male with a long history of progressive hip and groin  pain. X-rays demonstrated severe degenerative changes. The patient had not seen any significant improvement despite conservative nonsurgical intervention. After discussion of the risks and benefits of surgical intervention, the patient expressed understanding of the risks benefits and agree with plans for total hip arthroplasty.   The risks, benefits, and alternatives were discussed at length including but not limited to the risks of infection, bleeding, nerve injury, stiffness, blood clots, the need for revision surgery, limb length inequality, dislocation, cardiopulmonary complications, among others, and they were willing to proceed.  PROCEDURE IN DETAIL: The patient was brought into the operating room and, after adequate general anesthesia was achieved, the patient was placed in a right lateral decubitus position. Axillary roll was placed and all bony prominences were well-padded. The patient's left hip was cleaned and prepped with alcohol and DuraPrep and draped in the usual sterile fashion. A "timeout" was  performed as per usual protocol. A lateral curvilinear incision was made gently curving towards the posterior superior iliac spine. The IT band was incised in line with the skin incision and the fibers of the gluteus maximus were split in line. The piriformis tendon was identified, skeletonized, and incised at its insertion to the proximal femur and reflected posteriorly. A T type posterior capsulotomy was performed. Prior to dislocation of the femoral head, a threaded Steinmann pin was inserted through a separate stab incision into the pelvis superior to the acetabulum and bent in the form of a stylus so as to assess limb length and hip offset throughout the procedure. The femoral head was then dislocated posteriorly. Inspection of the femoral head demonstrated severe degenerative changes with full-thickness loss of articular cartilage. The femoral neck cut was performed using an oscillating saw. The anterior capsule was elevated off of the femoral neck using a periosteal elevator. Attention was then directed to the acetabulum. The remnant of the labrum was excised using electrocautery. Inspection of the acetabulum also demonstrated significant degenerative changes. The acetabulum was reamed in sequential fashion up to a 59 mm diameter. Good punctate bleeding bone was encountered. A 60 mm Pinnacle 100 acetabular component was positioned and impacted into place. Good scratch fit was appreciated. A +4 mm polyethylene trial was inserted.  Attention was then directed to the proximal femur. A hole for reaming of the proximal femoral canal was created using a high-speed burr. The femoral canal was reamed in sequential fashion up to a 17.5 mm diameter. This allowed for approximately 6 cm of scratch fit. Serial broaches were inserted up to a 18 mm large stature femoral broach. Calcar region was planed and a trial reduction was performed using a 36  mm hip ball with a +5 mm neck length. Good equalization of limb lengths  and hip offset was appreciated and excellent stability was noted both anteriorly and posteriorly. Trial components were removed. The acetabular shell was irrigated with copious amounts of normal saline with antibiotic solution and suctioned dry. A +4 mm Pinnacle Marathon polyethylene insert was positioned and impacted into place. Next, a 18 mm large stature AML femoral stem was positioned and impacted into place. Excellent scratch fit was appreciated. A trial reduction was again performed with a 36 mm hip ball with a +5 mm neck length. Again, good equalization of limb lengths was appreciated and excellent stability appreciated both anteriorly and posteriorly. The hip was then dislocated and the trial hip ball was removed. The Morse taper was cleaned and dried. A 36 mm M-SPEC hip ball with a +5 mm neck length was placed on the trunnion and impacted into place. The hip was then reduced and placed through range of motion. Excellent stability was appreciated both anteriorly and posteriorly.  The wound was irrigated with copious amounts of normal saline followed by 350 ml of Prontosan and suctioned dry. Good hemostasis was appreciated. The posterior capsulotomy was repaired using #5 Ethibond. Piriformis tendon was reapproximated to the undersurface of the gluteus medius tendon using #5 Ethibond. The IT band was reapproximated using interrupted sutures of #1 Vicryl. Subcutaneous tissue was approximated using first #0 Vicryl followed by #2-0 Vicryl. The skin was closed with skin staples.  The patient tolerated the procedure well and was transported to the recovery room in stable condition.   Marciano Sequin., M.D.

## 2021-12-14 NOTE — Anesthesia Procedure Notes (Signed)
Procedure Name: Intubation Date/Time: 12/14/2021 9:10 AM Performed by: Rolla Plate, CRNA Pre-anesthesia Checklist: Patient identified, Patient being monitored, Timeout performed, Emergency Drugs available and Suction available Patient Re-evaluated:Patient Re-evaluated prior to induction Oxygen Delivery Method: Circle system utilized Preoxygenation: Pre-oxygenation with 100% oxygen Induction Type: IV induction Ventilation: Mask ventilation without difficulty Laryngoscope Size: 4 and McGraph Grade View: Grade I Tube type: Oral Tube size: 7.5 mm Number of attempts: 1 Airway Equipment and Method: Stylet and Video-laryngoscopy Placement Confirmation: ETT inserted through vocal cords under direct vision, positive ETCO2 and breath sounds checked- equal and bilateral Secured at: 24.5 cm Tube secured with: Tape Dental Injury: Teeth and Oropharynx as per pre-operative assessment

## 2021-12-14 NOTE — Progress Notes (Signed)
°   12/14/21 0722  Clinical Encounter Type  Visited With Patient  Visit Type Pre-op;Social support  Regulatory affairs officer (For Healthcare)  Does Patient Have a Medical Advance Directive? Yes  Type of Paramedic of [place];Living will   Chaplain stepped in to provide support and assurance.

## 2021-12-14 NOTE — Evaluation (Signed)
Physical Therapy Evaluation Patient Details Name: Miguel Rios MRN: [id number] DOB: Feb 28, 1946 Today's Date: 12/14/2021  History of Present Illness  76 y/o male s/p L total hip replacement (posterior approach) 12/14/21,  h/o R total hip in 2014.  Clinical Impression  Pt eager to participate with PT but frankly struggled with most functional tasks needing considerable assist.  He was able to participate with supine exercises but did not have much functional strength t/o L LE.  Similarly he showed great effort with getting to EOB and rising to standing but needed heavy assist with each to perform while maintaining hip precautions and general safety.  Pt is tall with very long legs, even from very elevated bed height (>6" of extra elevation) he struggled to shift weight forward and needed heavy assist to rise to walker.  Once upright he was able to maintain standing with heavy UE reliance and showed ability to weight shift and take some small side shuffling steps but ultimately did not show the ability to safely venture away form EOB this POD0 session.  Pt still hopeful that he will improve and be able to go home but we did discuss the possible need for a stint in rehab before this is safe if he continues to struggle with mobility.       Recommendations for follow up therapy are one component of a multi-disciplinary discharge planning process, led by the attending physician.  Recommendations may be updated based on patient status, additional functional criteria and insurance authorization.  Follow Up Recommendations Skilled nursing-short term rehab (<3 hours/day) (pt hoping to improve enough to go home at d/c)    Assistance Recommended at Discharge Frequent or constant Supervision/Assistance  Patient can return home with the following  A lot of help with walking and/or transfers;A lot of help with bathing/dressing/bathroom;Assistance with cooking/housework;Assistance with feeding;Direct  supervision/assist for medications management;Direct supervision/assist for financial management;Assist for transportation    Equipment Recommendations  (TBD at next venue of care)  Recommendations for Other Services       Functional Status Assessment Patient has had a recent decline in their functional status and demonstrates the ability to make significant improvements in function in a reasonable and predictable amount of time.     Precautions / Restrictions Precautions Precautions: Posterior Hip;Fall Restrictions Weight Bearing Restrictions: Yes LLE Weight Bearing: Weight bearing as tolerated      Mobility  Bed Mobility Overal bed mobility: Needs Assistance Bed Mobility: Supine to Sit, Sit to Supine     Supine to sit: Mod assist Sit to supine: Mod assist   General bed mobility comments: Pt made great effort with transitions but needed considerable assist to get to/from sitting, maintain precautions and manage his long limbs safely    Transfers Overall transfer level: Needs assistance Equipment used: Rolling walker (2 wheels) Transfers: Sit to/from Stand Sit to Stand: Mod assist, Max assist, From elevated surface           General transfer comment: attempted standing w/o direct assist from bed raised ~6" with inability to lift off bed, raised bed another ~2" and provided max assist to insure full weight shift up and over walker.  Heavy cuing to insure L knee not collapsing in/hip trending toward internal rotation.  Once upright pt was able to make some adjustments and get weight over walker but he struggled with transition to upright despite great effort.    Ambulation/Gait               General Gait  Details: Pt unsafe to try true ambulation this date, he did manage to take some side steps along EOB and displayed ability to accept weight and maintain TKE on the L but highly reliant on walker and with poor overal confidence with standing and weight  shifting  Stairs            Wheelchair Mobility    Modified Rankin (Stroke Patients Only)       Balance Overall balance assessment: Needs assistance Sitting-balance support: Bilateral upper extremity supported Sitting balance-Leahy Scale: Good     Standing balance support: Bilateral upper extremity supported Standing balance-Leahy Scale: Fair Standing balance comment: once upright and in walker, poor with transitions to/from standing                             Pertinent Vitals/Pain Pain Assessment Pain Assessment: 0-10 Pain Score: 2  Pain Location: L hip    Home Living Family/patient expects to be discharged to:: Private residence Living Arrangements: Spouse/significant other Available Help at Discharge: Available 24 hours/day Type of Home: House Home Access:  (small threshold)       Home Layout: One level Home Equipment: Conservation officer, nature (2 wheels);Cane - single point      Prior Function Prior Level of Function : Independent/Modified Independent             Mobility Comments: Pt apparently walking ~1 mi/day w/o AD leading up to surgery       Hand Dominance        Extremity/Trunk Assessment   Upper Extremity Assessment Upper Extremity Assessment: Generalized weakness    Lower Extremity Assessment Lower Extremity Assessment: LLE deficits/detail (R LE grossly 4/5 t/o) LLE Deficits / Details: Pt with poor muscle control t/o L LE, able to get partial quad engagement, very limited strength with AAROM ABd, similarly struggled with any AROM hip flexion/heel slides       Communication   Communication: No difficulties  Cognition Arousal/Alertness: Awake/alert Behavior During Therapy: WFL for tasks assessed/performed Overall Cognitive Status: Within Functional Limits for tasks assessed                                 General Comments: some extra cuing typical of POD0 intervention but appropriate t/o        General  Comments General comments (skin integrity, edema, etc.): Pt did not recall any hip precautions from R total hip ~9 years ago, able to recall 2/3 at end of session after education earlier in session    Exercises Total Joint Exercises Ankle Circles/Pumps: AROM, 10 reps Quad Sets: Strengthening, 10 reps Gluteal Sets: AROM, 5 reps Heel Slides: AAROM, 10 reps (with lightly resisted leg extension) Hip ABduction/ADduction: AAROM, 10 reps (very little ABd, showed some strength with ADd) Straight Leg Raises:  (unable)   Assessment/Plan    PT Assessment Patient needs continued PT services  PT Problem List Decreased strength;Decreased range of motion;Decreased activity tolerance;Decreased balance;Decreased mobility;Decreased knowledge of use of DME;Decreased safety awareness;Pain       PT Treatment Interventions DME instruction;Gait training;Functional mobility training;Therapeutic activities;Therapeutic exercise;Balance training;Neuromuscular re-education;Patient/family education    PT Goals (Current goals can be found in the Care Plan section)  Acute Rehab PT Goals Patient Stated Goal: get stronger and go home PT Goal Formulation: With patient Time For Goal Achievement: 12/28/21 Potential to Achieve Goals: Fair    Frequency BID  Co-evaluation               AM-PAC PT "6 Clicks" Mobility  Outcome Measure Help needed turning from your back to your side while in a flat bed without using bedrails?: A Lot Help needed moving from lying on your back to sitting on the side of a flat bed without using bedrails?: A Lot Help needed moving to and from a bed to a chair (including a wheelchair)?: A Lot Help needed standing up from a chair using your arms (e.g., wheelchair or bedside chair)?: A Lot Help needed to walk in hospital room?: Total Help needed climbing 3-5 steps with a railing? : Total 6 Click Score: 10    End of Session Equipment Utilized During Treatment: Gait belt Activity  Tolerance: Patient limited by fatigue;Patient tolerated treatment well Patient left: with bed alarm set;with call bell/phone within reach Nurse Communication: Mobility status PT Visit Diagnosis: Muscle weakness (generalized) (M62.81);Difficulty in walking, not elsewhere classified (R26.2);Pain Pain - Right/Left: Left Pain - part of body: Hip    Time: 0981-1914 PT Time Calculation (min) (ACUTE ONLY): 74 min   Charges:   PT Evaluation $PT Eval Moderate Complexity: 1 Mod PT Treatments $Therapeutic Exercise: 23-37 mins $Therapeutic Activity: 23-37 mins        Kreg Shropshire, DPT 12/14/2021, 6:24 PM

## 2021-12-14 NOTE — Transfer of Care (Signed)
Immediate Anesthesia Transfer of Care Note  Patient: Miguel Rios  Procedure(s) Performed: TOTAL HIP ARTHROPLASTY (Left: Hip)  Patient Location: PACU  Anesthesia Type:General  Level of Consciousness: sedated  Airway & Oxygen Therapy: Patient Spontanous Breathing and Patient connected to face mask oxygen  Post-op Assessment: Report given to RN and Post -op Vital signs reviewed and stable  Post vital signs: Reviewed  Last Vitals:  Vitals Value Taken Time  BP 103/64 12/14/21 1325  Temp    Pulse 72 12/14/21 1328  Resp 9 12/14/21 1328  SpO2 99 % 12/14/21 1328  Vitals shown include unvalidated device data.  Last Pain:  Vitals:   12/14/21 0727  TempSrc: Temporal  PainSc: 0-No pain         Complications: No notable events documented.

## 2021-12-14 NOTE — H&P (Signed)
The patient has been re-examined, and the chart reviewed, and there have been no interval changes to the documented history and physical.    The risks, benefits, and alternatives have been discussed at length. The patient expressed understanding of the risks benefits and agreed with plans for surgical intervention.  Miguel Rios P. Shelia Magallon, Jr. M.D.    

## 2021-12-15 ENCOUNTER — Encounter: Payer: Self-pay | Admitting: Orthopedic Surgery

## 2021-12-15 DIAGNOSIS — M1612 Unilateral primary osteoarthritis, left hip: Secondary | ICD-10-CM | POA: Diagnosis not present

## 2021-12-15 NOTE — Progress Notes (Signed)
Physical Therapy Treatment Patient Details Name: Miguel Rios MRN: [id number] DOB: 01-03-1946 Today's Date: 12/15/2021   History of Present Illness 76 y/o male s/p L total hip replacement (posterior approach) 12/14/21,  h/o R total hip in 2014.    PT Comments    Pt is making good progress towards goals with ability to ambulate in hallway using RW. Still requires significant assist for transfers, however continues to be motivated to participate. Spouse ordered bed risers which will be delivered today to assist with bed level surface height. Good participation with there-ex. Able to recall 2/3 hip precautions. Will continue to progress as able.    Recommendations for follow up therapy are one component of a multi-disciplinary discharge planning process, led by the attending physician.  Recommendations may be updated based on patient status, additional functional criteria and insurance authorization.  Follow Up Recommendations  Home health PT     Assistance Recommended at Discharge Intermittent Supervision/Assistance  Patient can return home with the following A lot of help with walking and/or transfers   Equipment Recommendations  BSC/3in1    Recommendations for Other Services       Precautions / Restrictions Precautions Precautions: Posterior Hip;Fall Precaution Booklet Issued: Yes (comment) Restrictions Weight Bearing Restrictions: Yes LLE Weight Bearing: Weight bearing as tolerated     Mobility  Bed Mobility Overal bed mobility: Needs Assistance Bed Mobility: Supine to Sit, Sit to Supine     Supine to sit: Min assist Sit to supine: Min assist   General bed mobility comments: follows commands well with sequencing. Able to transition in/out of bed from flat [facility] to simulate home environment. Cues for maintaining hip precautions    Transfers Overall transfer level: Needs assistance Equipment used: Rolling walker (2 wheels) Transfers: Sit to/from Stand Sit to Stand:  Mod assist           General transfer comment: able to tolerate standing from slightly lower surface, but unable to stand from low surface and REQUIRES bed to be elevated for successful standing. L hip/knee does trend with IR with standing despite pre positioning    Ambulation/Gait Ambulation/Gait assistance: Min guard Gait Distance (Feet): 200 Feet Assistive device: Rolling walker (2 wheels) Gait Pattern/deviations: Step-through pattern       General Gait Details: ambulated with reciprocal and fluid gait pattern. RW used with safe technique   Stairs             Wheelchair Mobility    Modified Rankin (Stroke Patients Only)       Balance Overall balance assessment: Needs assistance Sitting-balance support: Bilateral upper extremity supported Sitting balance-Leahy Scale: Good     Standing balance support: Bilateral upper extremity supported Standing balance-Leahy Scale: Fair                              Cognition Arousal/Alertness: Awake/alert Behavior During Therapy: WFL for tasks assessed/performed Overall Cognitive Status: Within Functional Limits for tasks assessed                                 General Comments: eager to work with therapy        Exercises Other Exercises Other Exercises: supine ther-ex performed and written HEP given. Glut sets, SAQ, hip add squeezes, quad sets, hip abd/add, and LAQ x 12 reps with safe technique. Other Exercises: multiple reps of sit<>Stand from elevated surface while using RW.  Unable to perform without physical assist from this therapist.    General Comments        Pertinent Vitals/Pain Pain Assessment Pain Assessment: 0-10 Pain Score: 2  Pain Location: L hip Pain Descriptors / Indicators: Operative site guarding Pain Intervention(s): Limited activity within patient's tolerance, Premedicated before session, Repositioned    Home Living Family/patient expects to be discharged to::  Private residence Living Arrangements: Spouse/significant other Available Help at Discharge: Available 24 hours/day Type of Home: House Home Access: Other (comment) (small threshold)       Home Layout: One level Home Equipment: Conservation officer, nature (2 wheels);Cane - single point      Prior Function            PT Goals (current goals can now be found in the care plan section) Acute Rehab PT Goals Patient Stated Goal: get stronger and go home PT Goal Formulation: With patient Time For Goal Achievement: 12/28/21 Potential to Achieve Goals: Fair Progress towards PT goals: Progressing toward goals    Frequency    BID      PT Plan Current plan remains appropriate    Co-evaluation              AM-PAC PT "6 Clicks" Mobility   Outcome Measure  Help needed turning from your back to your side while in a flat bed without using bedrails?: A Little Help needed moving from lying on your back to sitting on the side of a flat bed without using bedrails?: A Little Help needed moving to and from a bed to a chair (including a wheelchair)?: A Little Help needed standing up from a chair using your arms (e.g., wheelchair or bedside chair)?: A Lot Help needed to walk in hospital room?: A Little Help needed climbing 3-5 steps with a railing? : A Little 6 Click Score: 17    End of Session Equipment Utilized During Treatment: Gait belt Activity Tolerance: Patient tolerated treatment well Patient left: in bed;with family/visitor present;with SCD's reapplied Nurse Communication: Mobility status PT Visit Diagnosis: Muscle weakness (generalized) (M62.81);Difficulty in walking, not elsewhere classified (R26.2);Pain Pain - Right/Left: Left Pain - part of body: Hip     Time: 1321-1402 PT Time Calculation (min) (ACUTE ONLY): 41 min  Charges:  $Gait Training: 23-37 mins $Therapeutic Exercise: 8-22 mins                     Greggory Stallion, PT, DPT,  [facility] [phone]    Pio Eatherly 12/15/2021, 2:32 PM

## 2021-12-15 NOTE — Progress Notes (Signed)
Drain removed by [employer] - pt tolerated well and dressing placed over site - pt denies pain at this time and is ordering breakfast.

## 2021-12-15 NOTE — Evaluation (Signed)
Occupational Therapy Evaluation Patient Details Name: Miguel Rios MRN: [id number] DOB: 1946-11-15 Today's Date: 12/15/2021   History of Present Illness 76 y/o male s/p L total hip replacement (posterior approach) 12/14/21,  h/o R total hip in 2014.   Clinical Impression   Patient presenting with decreased Ind in self care, balance, functional mobility/transfers, endurance, and safety awareness. Patient reports being mod I at baseline and lives with wife PTA. Pt is able to verbalize posterior precautions but secondary to height of 6'7 it is very difficulty to maintain precautions throughout session. OT asking wife to get measurements of furniture for safety. Patient currently needing heavy mod A to stand from elevated bed height.OT educated pt on LB dressing with pt returning demonstrations with use of LH reacher to thread pants onto L foot. Patient will benefit from acute OT to increase overall independence in the areas of ADLs, functional mobility, and safety awareness in order to safely discharge home with family.      Recommendations for follow up therapy are one component of a multi-disciplinary discharge planning process, led by the attending physician.  Recommendations may be updated based on patient status, additional functional criteria and insurance authorization.   Follow Up Recommendations  Home health OT    Assistance Recommended at Discharge Intermittent Supervision/Assistance  Patient can return home with the following A little help with walking and/or transfers;A little help with bathing/dressing/bathroom;Help with stairs or ramp for entrance;Other (comment) (assist with standing from furniture)    Functional Status Assessment  Patient has had a recent decline in their functional status and demonstrates the ability to make significant improvements in function in a reasonable and predictable amount of time.  Equipment Recommendations  BSC/3in1       Precautions /  Restrictions Precautions Precautions: Posterior Hip;Fall Precaution Booklet Issued: Yes (comment) Restrictions Weight Bearing Restrictions: Yes LLE Weight Bearing: Weight bearing as tolerated      Mobility Bed Mobility Overal bed mobility: Needs Assistance Bed Mobility: Supine to Sit, Sit to Supine     Supine to sit: Min assist Sit to supine: Mod assist        Transfers Overall transfer level: Needs assistance Equipment used: Rolling walker (2 wheels) Transfers: Sit to/from Stand Sit to Stand: Mod assist           General transfer comment: heavy mod A from elevated surface to stand for LB clothing management      Balance Overall balance assessment: Needs assistance Sitting-balance support: Bilateral upper extremity supported Sitting balance-Leahy Scale: Good     Standing balance support: Bilateral upper extremity supported Standing balance-Leahy Scale: Fair Standing balance comment: once upright and in walker, poor with transitions to/from standing                           ADL either performed or assessed with clinical judgement   ADL Overall ADL's : Needs assistance/impaired     Grooming: Wash/dry hands;Wash/dry face;Sitting;Supervision/safety;Set up               Lower Body Dressing: Minimal assistance;Sit to/from stand                       Vision Patient Visual Report: No change from baseline              Pertinent Vitals/Pain Pain Assessment Pain Assessment: 0-10 Pain Score: 2  Pain Location: L hip Pain Descriptors / Indicators: Operative site guarding Pain Intervention(s): Limited  activity within patient's tolerance, Monitored during session, Repositioned        Extremity/Trunk Assessment Upper Extremity Assessment Upper Extremity Assessment: Generalized weakness   Lower Extremity Assessment Lower Extremity Assessment: LLE deficits/detail          Cognition Arousal/Alertness: Awake/alert Behavior During  Therapy: WFL for tasks assessed/performed Overall Cognitive Status: Within Functional Limits for tasks assessed                                 General Comments: pleasant and agreeable                Home Living Family/patient expects to be discharged to:: Private residence Living Arrangements: Spouse/significant other Available Help at Discharge: Available 24 hours/day Type of Home: House Home Access: Other (comment) (small threshold)     Home Layout: One level     Bathroom Shower/Tub: Teacher, early years/pre: Standard     Home Equipment: Conservation officer, nature (2 wheels);Cane - single point          Prior Functioning/Environment Prior Level of Function : Independent/Modified Independent             Mobility Comments: Pt apparently walking ~1 mi/day w/o AD leading up to surgery ADLs Comments: Pt reports independent with ADLs        OT Problem List: Decreased strength;Decreased activity tolerance;Impaired balance (sitting and/or standing);Decreased safety awareness;Decreased knowledge of precautions      OT Treatment/Interventions: Self-care/ADL training;Therapeutic exercise;Therapeutic activities;Energy conservation;Patient/family education;Manual therapy;Balance training    OT Goals(Current goals can be found in the care plan section) Acute Rehab OT Goals Patient Stated Goal: to return home OT Goal Formulation: With patient/family Time For Goal Achievement: 12/29/21 Potential to Achieve Goals: Fair ADL Goals Pt Will Perform Grooming: with modified independence;standing Pt Will Perform Lower Body Dressing: with modified independence;sit to/from stand Pt Will Transfer to Toilet: with modified independence;ambulating Pt Will Perform Toileting - Clothing Manipulation and hygiene: with modified independence;sit to/from stand  OT Frequency: Min 2X/week       AM-PAC OT "6 Clicks" Daily Activity     Outcome Measure Help from another person  eating meals?: None Help from another person taking care of personal grooming?: None Help from another person toileting, which includes using toliet, bedpan, or urinal?: A Little Help from another person bathing (including washing, rinsing, drying)?: A Little Help from another person to put on and taking off regular upper body clothing?: None Help from another person to put on and taking off regular lower body clothing?: A Little 6 Click Score: 21   End of Session Equipment Utilized During Treatment: Rolling walker (2 wheels) Nurse Communication: Mobility status  Activity Tolerance: Patient tolerated treatment well;Patient limited by fatigue Patient left: in bed;with call bell/phone within reach;with bed alarm set;with family/visitor present  OT Visit Diagnosis: Unsteadiness on feet (R26.81);Muscle weakness (generalized) (M62.81)                Time: 1025-1050 OT Time Calculation (min): 25 min Charges:  OT General Charges $OT Visit: 1 Visit OT Evaluation $OT Eval Moderate Complexity: 1 Mod OT Treatments $Self Care/Home Management : 8-22 mins  Darleen Crocker, MS, OTR/L , CBIS ascom [phone]  12/15/21, 2:10 PM

## 2021-12-15 NOTE — Progress Notes (Signed)
Physical Therapy Treatment Patient Details Name: Miguel Rios MRN: [id number] DOB: October 28, 1946 Today's Date: 12/15/2021   History of Present Illness 76 y/o male s/p L total hip replacement (posterior approach) 12/14/21,  h/o R total hip in 2014.    PT Comments    Pt is making slow progress towards goals with ability to improve gait pattern and ambulation. Pt still struggles with transfers and needs significant assist for standing. Extensive time to problem solve with family on ability to improve transfer ability prior to home discharge. Requires firm surface to push from using B UE and physical assist along with cues for minimizing L hip/knee IR during transfer. Once standing, able to ambulate around RN station with ease. Will be challenging to find a seating surface to meet hip precautions as he is very tall making it unsafe to sit in recliner without breaking hip flexion. Good endurance with there-ex and written HEP. Updated MD about dispo change, upgrading to HHPT at this time with pt/family in agreement. FUrther discussion to come regarding seated surface in home environment. Also will need elevated BSC for home use as questionable if pt has one. Will continue to progress as able.  Recommendations for follow up therapy are one component of a multi-disciplinary discharge planning process, led by the attending physician.  Recommendations may be updated based on patient status, additional functional criteria and insurance authorization.  Follow Up Recommendations  Home health PT     Assistance Recommended at Discharge Intermittent Supervision/Assistance  Patient can return home with the following A lot of help with walking and/or transfers   Equipment Recommendations  BSC/3in1    Recommendations for Other Services       Precautions / Restrictions Precautions Precautions: Posterior Hip;Fall Precaution Booklet Issued: Yes (comment) Restrictions Weight Bearing Restrictions: Yes LLE  Weight Bearing: Weight bearing as tolerated     Mobility  Bed Mobility Overal bed mobility: Needs Assistance Bed Mobility: Supine to Sit, Sit to Supine     Supine to sit: Min assist     General bed mobility comments: able to follow commands well for sequencing. Needs min assist to guide surgical leg off bed. Once seated, needs cues for maintaining hip precautions.    Transfers Overall transfer level: Needs assistance Equipment used: Rolling walker (2 wheels) Transfers: Sit to/from Stand Sit to Stand: Mod assist, Max assist, From elevated surface           General transfer comment: unable to stand without bed significantly elevated. Several attempts performed with pushing from bed surface, unsuccessful. Able to perform lateral scoot towards [facility] and uses railing for standing. L knee/hip does trend towards IR with standing attempt despite cues.    Ambulation/Gait Ambulation/Gait assistance: Min guard Gait Distance (Feet): 200 Feet Assistive device: Rolling walker (2 wheels) Gait Pattern/deviations: Step-through pattern       General Gait Details: does very well with ambulation once standing. Does require cues for L turns and maintaining hip precautions. RW used with ability to improve to reciprocal gait pattern. No fatigue/rest breaks needed   Stairs             Wheelchair Mobility    Modified Rankin (Stroke Patients Only)       Balance Overall balance assessment: Needs assistance Sitting-balance support: Bilateral upper extremity supported Sitting balance-Leahy Scale: Good     Standing balance support: Bilateral upper extremity supported Standing balance-Leahy Scale: Fair  Cognition Arousal/Alertness: Awake/alert Behavior During Therapy: WFL for tasks assessed/performed Overall Cognitive Status: Within Functional Limits for tasks assessed                                 General Comments: pleasant  and eager to work with therapy        Exercises Other Exercises Other Exercises: supine ther-ex performed and written HEP given. AP, glut sets, hip abd/add, and LAQ x 12 reps with safe technique. Other Exercises: practiced several reps of standing with focusing on hand placement and improving indepedence. Still requires sig assist for transfer and elevated surface    General Comments        Pertinent Vitals/Pain Pain Assessment Pain Assessment: 0-10 Pain Score: 2  Pain Location: L hip Pain Descriptors / Indicators: Operative site guarding Pain Intervention(s): Limited activity within patient's tolerance    Home Living                          Prior Function            PT Goals (current goals can now be found in the care plan section) Acute Rehab PT Goals Patient Stated Goal: get stronger and go home PT Goal Formulation: With patient Time For Goal Achievement: 12/28/21 Potential to Achieve Goals: Fair Progress towards PT goals: Progressing toward goals    Frequency    BID      PT Plan Discharge plan needs to be updated    Co-evaluation              AM-PAC PT "6 Clicks" Mobility   Outcome Measure  Help needed turning from your back to your side while in a flat bed without using bedrails?: A Little Help needed moving from lying on your back to sitting on the side of a flat bed without using bedrails?: A Little Help needed moving to and from a bed to a chair (including a wheelchair)?: A Little Help needed standing up from a chair using your arms (e.g., wheelchair or bedside chair)?: A Lot Help needed to walk in hospital room?: A Little Help needed climbing 3-5 steps with a railing? : A Little 6 Click Score: 17    End of Session Equipment Utilized During Treatment: Gait belt Activity Tolerance: Patient tolerated treatment well Patient left:  (left sitting at EOB with OT) Nurse Communication: Mobility status PT Visit Diagnosis: Muscle weakness  (generalized) (M62.81);Difficulty in walking, not elsewhere classified (R26.2);Pain Pain - Right/Left: Left Pain - part of body: Hip     Time: 2025-4270 PT Time Calculation (min) (ACUTE ONLY): 38 min  Charges:  $Gait Training: 23-37 mins $Therapeutic Exercise: 8-22 mins                     Greggory Stallion, PT, DPT, GCS [phone]    Cherly Erno 12/15/2021, 12:11 PM

## 2021-12-15 NOTE — NC FL2 (Signed)
[place] LEVEL OF CARE SCREENING TOOL     IDENTIFICATION  Patient Name: Miguel Rios Birthdate: 1946/10/20 Sex: male Admission Date (Current Location): 12/14/2021  [facility] and [place] Number:  Engineering geologist and Address:  [facility], [address], [place], [place] [address]      Provider Number: [id number]  Attending Physician Name and Address:  Dereck Leep, MD  Relative Name and Phone Number:  Vincenza Hews, [phone]    Current Level of Care: Hospital Recommended Level of Care: [place] Prior Approval Number:    Date Approved/Denied:   PASRR Number: [id number]  Discharge Plan: SNF    Current Diagnoses: Patient Active Problem List   Diagnosis Date Noted   Hx of total hip arthroplasty, left 12/14/2021   Chronic fatigue 09/03/2021   Primary osteoarthritis of left hip 09/03/2021   Sea sickness 09/03/2021   History of atrial fibrillation 03/15/2021   ASCVD (arteriosclerotic cardiovascular disease) 03/04/2021   Essential (primary) hypertension 07/01/2019   Long term (current) use of anticoagulants 07/01/2019   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[place]) 07/01/2019   Stem cells transplant status ([place]) 06/03/2019   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[place]) 02/12/2019   Benign prostatic hyperplasia with lower urinary tract symptoms 11/27/2018   Adenomatous polyp 11/12/2017   Elevated blood sugar 03/28/2017   Left hip pain 03/28/2017   Prediabetes 03/28/2017   Status post right hip replacement 10/13/2015   Benign prostatic hyperplasia with urinary frequency 07/19/2015   Obesity 05/18/2015   Hyperlipidemia 05/18/2015   Impaired fasting glucose 05/18/2015   Onychomycosis 05/18/2015   Oral herpes simplex infection 05/18/2015   Peripheral venous insufficiency 05/18/2015   Vitamin D deficiency 05/18/2015   Polyp of colon 04/27/2008    Orientation RESPIRATION BLADDER Height & Weight     Self, Time,  Situation, Place  Normal Continent Weight: 125.6 kg Height:  6' 7" (200.7 cm)  BEHAVIORAL SYMPTOMS/MOOD NEUROLOGICAL BOWEL NUTRITION STATUS      Continent Diet (regular)  AMBULATORY STATUS COMMUNICATION OF NEEDS Skin   Extensive Assist Verbally Normal, Surgical wounds                       Personal Care Assistance Level of Assistance  Bathing, Feeding, Dressing Bathing Assistance: Limited assistance Feeding assistance: Independent Dressing Assistance: Limited assistance     Functional Limitations Info             SPECIAL CARE FACTORS FREQUENCY  PT (By licensed PT), OT (By licensed OT)     PT Frequency: 5 times per week OT Frequency: 5 times per week            Contractures Contractures Info: Not present    Additional Factors Info  Code Status, Allergies Code Status Info: full code Allergies Info: NKDA           Current Medications (12/15/2021):  This is the current hospital active medication list Current Facility-Administered Medications  Medication Dose Route Frequency Provider Last Rate Last Admin   0.9 %  sodium chloride infusion   Intravenous Continuous Hooten, Laurice Record, MD 100 mL/hr at 12/14/21 1507 New Bag at 12/14/21 1507   acetaminophen (TYLENOL) tablet 1,000 mg  1,000 mg Oral Q6H Virl Cagey E, RPH   1,000 mg at 12/15/21 0542   acetaminophen (TYLENOL) tablet 325-650 mg  325-650 mg Oral Q6H PRN Hooten, Laurice Record, MD       alum & mag hydroxide-simeth (MAALOX/MYLANTA) 200-200-20 MG/5ML suspension 30  mL  30 mL Oral Q4H PRN Hooten, Laurice Record, MD       bisacodyl (DULCOLAX) suppository 10 mg  10 mg Rectal Daily PRN Hooten, Laurice Record, MD       calcium carbonate (TUMS - dosed in mg elemental calcium) chewable tablet 1,000 mg  1,000 mg Oral TID WC Hooten, Laurice Record, MD   1,000 mg at 12/15/21 9191   celecoxib (CELEBREX) capsule 200 mg  200 mg Oral BID Dereck Leep, MD   200 mg at 12/15/21 6606   cholecalciferol (VITAMIN D3) tablet 1,000 Units  1,000 Units Oral  Daily Dereck Leep, MD   1,000 Units at 12/15/21 0944   cholestyramine (QUESTRAN) packet 4 g  4 g Oral Daily Hooten, Laurice Record, MD   4 g at 12/15/21 0949   diltiazem (CARDIZEM CD) 24 hr capsule 120 mg  120 mg Oral Daily Hooten, Laurice Record, MD   120 mg at 12/15/21 0949   diphenhydrAMINE (BENADRYL) 12.5 MG/5ML elixir 12.5-25 mg  12.5-25 mg Oral Q4H PRN Hooten, Laurice Record, MD       enoxaparin (LOVENOX) injection 30 mg  30 mg Subcutaneous Q12H Hooten, Laurice Record, MD   30 mg at 12/15/21 0944   ferrous sulfate tablet 325 mg  325 mg Oral BID WC Hooten, Laurice Record, MD   325 mg at 12/15/21 0946   fluticasone (FLONASE) 50 MCG/ACT nasal spray 1 spray  1 spray Each Nare Daily Hooten, Laurice Record, MD       loratadine (CLARITIN) tablet 10 mg  10 mg Oral Daily Hooten, Laurice Record, MD   10 mg at 12/15/21 0946   magnesium hydroxide (MILK OF MAGNESIA) suspension 30 mL  30 mL Oral Daily Hooten, Laurice Record, MD       menthol-cetylpyridinium (CEPACOL) lozenge 3 mg  1 lozenge Oral PRN Hooten, Laurice Record, MD       Or   phenol (CHLORASEPTIC) mouth spray 1 spray  1 spray Mouth/Throat PRN Hooten, Laurice Record, MD       metoCLOPramide (REGLAN) tablet 10 mg  10 mg Oral TID AC & HS Hooten, Laurice Record, MD   10 mg at 12/15/21 0948   metoprolol succinate (TOPROL-XL) 24 hr tablet 50 mg  50 mg Oral Daily Hooten, Laurice Record, MD   50 mg at 12/15/21 0947   multivitamin with minerals tablet 1 tablet  1 tablet Oral Daily Hooten, Laurice Record, MD   1 tablet at 12/15/21 0948   ondansetron (ZOFRAN) tablet 4 mg  4 mg Oral Q6H PRN Hooten, Laurice Record, MD       Or   ondansetron (ZOFRAN) injection 4 mg  4 mg Intravenous Q6H PRN Hooten, Laurice Record, MD       oxyCODONE (Oxy IR/ROXICODONE) immediate release tablet 10 mg  10 mg Oral Q4H PRN Hooten, Laurice Record, MD   10 mg at 12/14/21 1527   oxyCODONE (Oxy IR/ROXICODONE) immediate release tablet 5 mg  5 mg Oral Q4H PRN Hooten, Laurice Record, MD       pantoprazole (PROTONIX) EC tablet 40 mg  40 mg Oral BID Dereck Leep, MD   40 mg at 12/15/21 0946    senna-docusate (Senokot-S) tablet 1 tablet  1 tablet Oral BID Dereck Leep, MD   1 tablet at 12/15/21 0946   sodium phosphate (FLEET) 7-19 GM/118ML enema 1 enema  1 enema Rectal Once PRN Hooten, Laurice Record, MD       tamsulosin (FLOMAX) capsule 0.4 mg  0.4 mg Oral Daily Hooten, Laurice Record, MD   0.4 mg at 12/15/21 0947   traMADol (ULTRAM) tablet 50-100 mg  50-100 mg Oral Q4H PRN Hooten, Laurice Record, MD       valACYclovir (VALTREX) tablet 500 mg  500 mg Oral Daily Hooten, Laurice Record, MD   500 mg at 12/15/21 8850     Discharge Medications: Please see discharge summary for a list of discharge medications.  Relevant Imaging Results:  Relevant Lab Results:   Additional Information SS# [id number]  Conception Oms, RN

## 2021-12-15 NOTE — Progress Notes (Addendum)
°  Subjective: 1 Day Post-Op Procedure(s) (LRB): TOTAL HIP ARTHROPLASTY (Left) Patient reports pain as well-controlled.   Patient is well, and has had no acute complaints or problems Plan is to go Home after hospital stay. Negative for chest pain and shortness of breath Fever: no Gastrointestinal: negative for nausea and vomiting.   Patient has had a bowel movement.  Objective: Vital signs in last 24 hours: Temp:  [97.5 F (36.4 C)-99.9 F (37.7 C)] 98 F (36.7 C) (01/19 0803) Pulse Rate:  [69-98] 98 (01/19 0803) Resp:  [9-20] 18 (01/19 0803) BP: (103-143)/(63-85) 130/72 (01/19 0803) SpO2:  [91 %-100 %] 99 % (01/19 0803)  Intake/Output from previous day:  Intake/Output Summary (Last 24 hours) at 12/15/2021 0912 Last data filed at 12/15/2021 2863 Gross per 24 hour  Intake 2677.71 ml  Output 1565 ml  Net 1112.71 ml    Intake/Output this shift: No intake/output data recorded.  Labs: No results for input(s): HGB in the last 72 hours. No results for input(s): WBC, RBC, HCT, PLT in the last 72 hours. No results for input(s): NA, K, CL, CO2, BUN, CREATININE, GLUCOSE, CALCIUM in the last 72 hours. No results for input(s): LABPT, INR in the last 72 hours.   EXAM General - Patient is Alert, Appropriate, and Oriented Extremity - Neurovascular intact Dorsiflexion/Plantar flexion intact Compartment soft Dressing/Incision -clean with only mild bloody drainage proximally, dry, Hemovac in place Motor Function - intact, moving foot and toes well on exam.    Cardiovascular- Regular rate and rhythm, no murmurs/rubs/gallops Respiratory- Lungs clear to auscultation bilaterally Gastrointestinal- soft, nontender, and active bowel sounds   Assessment/Plan: 1 Day Post-Op Procedure(s) (LRB): TOTAL HIP ARTHROPLASTY (Left) Principal Problem:   Hx of total hip arthroplasty, left  Estimated body mass index is 31.21 kg/m as calculated from the following:   Height as of this encounter: 6'  7" (2.007 m).   Weight as of this encounter: 125.6 kg. Advance diet Up with therapy  Maintain posterior hip precautions. Possible d/c today but given patient's slow progress with therapy yesterday will likely require additional night in hospital.  Hemovac removed. and Mini compression dressing applied.   DVT Prophylaxis - Lovenox, Ted hose, and SCDs Weight-Bearing as tolerated to left leg  Cassell Smiles, PA-C [facility] 12/15/2021, 9:12 AM

## 2021-12-15 NOTE — Progress Notes (Signed)
Spoke with the Patient's wife Wells Guiles, the patient has transportation with her, he can afford his medication He is set up with [facility] for [facility] services, he needs a 3 in 1 and rolling walker extensions,  [employer] will deliver to the room prior to DC

## 2021-12-15 NOTE — Discharge Summary (Signed)
Physician Discharge Summary  Patient ID: Miguel Rios MRN: [id number] DOB/AGE: [birth year] 75 y.o.  Admit date: 12/14/2021 Discharge date: 12/16/2021  Admission Diagnoses:  Hx of total hip arthroplasty, left [Z96.642]  Surgeries:Procedure(s): Left total hip arthroplasty   SURGEON:  Marciano Sequin. M.D.   ASSISTANT: Cassell Smiles, PA-C (present and scrubbed throughout the case, critical for assistance with exposure, retraction, instrumentation, and closure)   ANESTHESIA: general   ESTIMATED BLOOD LOSS: 250 mL   FLUIDS REPLACED: 1500 mL of crystalloid   DRAINS: 2 medium Hemovac drains   IMPLANTS UTILIZED: DePuy 18 mm large stature AML femoral stem, 60 mm OD Pinnacle 100 acetabular component, +4 mm neutral Pinnacle Marathon polyethylene insert, and a 36 mm M-SPEC +5 mm hip ball  Discharge Diagnoses: Patient Active Problem List   Diagnosis Date Noted   Hx of total hip arthroplasty, left 12/14/2021   Chronic fatigue 09/03/2021   Primary osteoarthritis of left hip 09/03/2021   Sea sickness 09/03/2021   History of atrial fibrillation 03/15/2021   ASCVD (arteriosclerotic cardiovascular disease) 03/04/2021   Essential (primary) hypertension 07/01/2019   Long term (current) use of anticoagulants 07/01/2019   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[place]) 07/01/2019   Stem cells transplant status ([place]) 06/03/2019   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[place]) 02/12/2019   Benign prostatic hyperplasia with lower urinary tract symptoms 11/27/2018   Adenomatous polyp 11/12/2017   Elevated blood sugar 03/28/2017   Left hip pain 03/28/2017   Prediabetes 03/28/2017   Status post right hip replacement 10/13/2015   Benign prostatic hyperplasia with urinary frequency 07/19/2015   Obesity 05/18/2015   Hyperlipidemia 05/18/2015   Impaired fasting glucose 05/18/2015   Onychomycosis 05/18/2015   Oral herpes simplex infection 05/18/2015   Peripheral venous insufficiency 05/18/2015   Vitamin D deficiency  05/18/2015   Polyp of colon 04/27/2008    Past Medical History:  Diagnosis Date   Cancer ([place])    multiple moyloma   Hypertension    Pre-diabetes      Transfusion:    Consultants (if any):   Discharged Condition: Improved  Hospital Course: Miguel Rios is an 76 y.o. male who was admitted 12/14/2021 with a diagnosis of left hip osteoarthritis and went to the operating room on 12/14/2021 and underwent left total hip arthroplasty through posterior approach. The patient received perioperative antibiotics for prophylaxis (see below). The patient tolerated the procedure well and was transported to PACU in stable condition. After meeting PACU criteria, the patient was subsequently transferred to the Orthopaedics/Rehabilitation unit.   The patient received DVT prophylaxis in the form of early mobilization, Lovenox, TED hose, and SCDs . A sacral pad had been placed and heels were elevated off of the bed with rolled towels in order to protect skin integrity. Foley catheter was discontinued on postoperative day #0. Wound drains were discontinued on postoperative day #1. The surgical incision was healing well without signs of infection.  Physical therapy was initiated postoperatively for transfers, gait training, and strengthening. Occupational therapy was initiated for activities of daily living and evaluation for assisted devices. Rehabilitation goals were reviewed in detail with the patient. The patient made steady progress with physical therapy and physical therapy recommended discharge to Home.   The patient achieved the preliminary goals of this hospitalization and was felt to be medically and orthopaedically appropriate for discharge.  He was given perioperative antibiotics:  Anti-infectives (From admission, onward)    Start     Dose/Rate Route Frequency Ordered Stop   12/14/21 1700  valACYclovir (VALTREX)  tablet 500 mg        500 mg Oral Daily 12/14/21 1456     12/14/21 1530  ceFAZolin  (ANCEF) IVPB 2g/100 mL premix        2 g 200 mL/hr over 30 Minutes Intravenous Every 6 hours 12/14/21 1456 12/14/21 2156   12/14/21 0717  ceFAZolin (ANCEF) 2-4 GM/100ML-% IVPB       Note to Pharmacy: Maryagnes Amos B: cabinet override      12/14/21 0717 12/14/21 1929   12/14/21 0600  ceFAZolin (ANCEF) IVPB 2g/100 mL premix  Status:  Discontinued        2 g 200 mL/hr over 30 Minutes Intravenous On call to O.R. 12/13/21 2256 12/14/21 0709     .  Recent vital signs:  Vitals:   12/16/21 0433 12/16/21 0749  BP: (!) 154/72 (!) 151/75  Pulse: 83 86  Resp: 17 16  Temp: 98.2 F (36.8 C) 98.1 F (36.7 C)  SpO2: 97% 96%    Recent laboratory studies:  No results for input(s): WBC, HGB, HCT, PLT, K, CL, CO2, BUN, CREATININE, GLUCOSE, CALCIUM, LABPT, INR in the last 72 hours.  Diagnostic Studies: DG Hip Port Unilat With Pelvis 1V Left  Result Date: 12/14/2021 CLINICAL DATA:  Left hip arthroplasty EXAM: DG HIP (WITH OR WITHOUT PELVIS) 1V PORT LEFT COMPARISON:  None FINDINGS: There is evidence of recent left hip arthroplasty. There are pockets of air in the soft tissues. Skin staples are seen. There is previous right hip arthroplasty. IMPRESSION: Status post left hip arthroplasty. Electronically Signed   By: Elmer Picker M.D.   On: 12/14/2021 13:54    Discharge Medications:   Allergies as of 12/16/2021   No Known Allergies      Medication List     STOP taking these medications    aspirin EC 81 MG tablet       TAKE these medications    calcium elemental as carbonate 400 MG chewable tablet Commonly known as: BARIATRIC TUMS ULTRA Chew 1,000 mg by mouth 3 (three) times daily.   celecoxib 200 MG capsule Commonly known as: CELEBREX Take 1 capsule (200 mg total) by mouth 2 (two) times daily.   cholecalciferol 25 MCG (1000 UNIT) tablet Commonly known as: VITAMIN D3 Take 1,000 Units by mouth daily.   cholestyramine 4 g packet Commonly known as: QUESTRAN Take 4 g by  mouth daily.   diltiazem 120 MG 24 hr capsule Commonly known as: CARDIZEM CD Take 120 mg by mouth daily.   enoxaparin 40 MG/0.4ML injection Commonly known as: LOVENOX Inject 0.4 mLs (40 mg total) into the skin daily for 14 days.   fluticasone 50 MCG/ACT nasal spray Commonly known as: FLONASE Place 1 spray into both nostrils daily.   lenalidomide 10 MG capsule Commonly known as: REVLIMID Take 10 mg by mouth daily. [employer] Auth #     Date Obtained  On hold for 2 weeks following surgery 12/14/21   loratadine 10 MG tablet Commonly known as: CLARITIN Take 10 mg by mouth daily.   metoprolol succinate 50 MG 24 hr tablet Commonly known as: TOPROL-XL Take 50 mg by mouth daily. Take with or immediately following a meal.   multivitamin with minerals tablet Take 1 tablet by mouth daily.   omeprazole 40 MG capsule Commonly known as: PRILOSEC Take 40 mg by mouth daily.   oxyCODONE 5 MG immediate release tablet Commonly known as: Oxy IR/ROXICODONE Take 1 tablet (5 mg total) by mouth every 4 (four) hours  as needed for severe pain.   tamsulosin 0.4 MG Caps capsule Commonly known as: FLOMAX Take 0.4 mg by mouth daily.   traMADol 50 MG tablet Commonly known as: ULTRAM Take 1-2 tablets (50-100 mg total) by mouth every 4 (four) hours as needed for moderate pain.   valACYclovir 500 MG tablet Commonly known as: VALTREX Take 500 mg by mouth daily.               Durable Medical Equipment  (From admission, onward)           Start     Ordered   12/15/21 1417  For home use only DME Other see comment  Once       Comments: Extensions for 3 in 1, patient is 6 foot 7 inches tall  Question:  Length of Need  Answer:  Lifetime   12/15/21 1416   12/15/21 1326  For home use only DME Other see comment  Once       Comments: Rolling walker extensions, The patient is 6 ft 7  Question:  Length of Need  Answer:  Lifetime   12/15/21 1326   12/14/21 1457  DME Walker rolling  Once        Question:  Patient needs a walker to treat with the following condition  Answer:  S/P total hip arthroplasty   12/14/21 1456   12/14/21 1457  DME Bedside commode  Once       Question:  Patient needs a bedside commode to treat with the following condition  Answer:  S/P total hip arthroplasty   12/14/21 1456            Disposition: Home with home health PT     Follow-up Information     Hooten, Laurice Record, MD Follow up on 01/26/2022.   Specialty: Orthopedic Surgery Why: at 10:45am Contact information: [address] [place] [address] [place], PA-C 12/16/2021, 8:54 AM

## 2021-12-16 DIAGNOSIS — M1612 Unilateral primary osteoarthritis, left hip: Secondary | ICD-10-CM | POA: Diagnosis not present

## 2021-12-16 MED ORDER — CELECOXIB 200 MG PO CAPS: 200.0000 mg | ORAL_CAPSULE | Freq: Two times a day (BID) | ORAL | 0 refills | Status: AC

## 2021-12-16 MED ORDER — OXYCODONE HCL 5 MG PO TABS: 5.0000 mg | ORAL_TABLET | ORAL | 0 refills | Status: AC | PRN

## 2021-12-16 MED ORDER — TRAMADOL HCL 50 MG PO TABS: 50.0000 mg | ORAL_TABLET | ORAL | 0 refills | Status: AC | PRN

## 2021-12-16 MED ORDER — ENOXAPARIN SODIUM 40 MG/0.4ML IJ SOSY: 40.0000 mg | PREFILLED_SYRINGE | INTRAMUSCULAR | 0 refills | Status: AC

## 2021-12-16 NOTE — Progress Notes (Signed)
Discharge Note: Reviewed d'ced instructions with pt.  Pt verbalized understanding. D'ced with personal belongings, Bsc, and RW. Bilateral teds on. IV cath intact upon removal. Staff wheeled pt out.  Pt transported to home via family vehicle.

## 2021-12-16 NOTE — Progress Notes (Signed)
Physical Therapy Treatment Patient Details Name: Miguel Rios MRN: [id number] DOB: 1946/10/06 Today's Date: 12/16/2021   History of Present Illness 76 y/o male s/p L total hip replacement (posterior approach) 12/14/21,  h/o R total hip in 2014.    PT Comments    Pt was long sitting in bed eager to participate. Discussed at length how his height makes following posterior precautions even more important. He states understanding and bariatric BSC recommended for elevated height. Pt is extremely cooperative and was able to excit bed and stand with assistance. Per pt, supportive spouse and daughter are available to assist at DC. Pt attempted to have BM 2 x without success. He was able to ambulate 200 ft and perform ascending/descending step without difficulty. Author will return once pt's spouse arrives to discuss concerns however pt is cleared to DC home with HHPT from a PT standpoint. Pt will benefit from HHPT to continue to assist pt to maximal independence with ADLs.    Recommendations for follow up therapy are one component of a multi-disciplinary discharge planning process, led by the attending physician.  Recommendations may be updated based on patient status, additional functional criteria and insurance authorization.  Follow Up Recommendations  Home health PT     Assistance Recommended at Discharge Frequent or constant Supervision/Assistance  Patient can return home with the following A lot of help with walking and/or transfers   Equipment Recommendations  Other (comment) (Bariatric BSC due to pt's height.)       Precautions / Restrictions Precautions Precautions: Posterior Hip;Fall Precaution Booklet Issued: Yes (comment) Restrictions Weight Bearing Restrictions: Yes LLE Weight Bearing: Weight bearing as tolerated     Mobility  Bed Mobility Overal bed mobility: Needs Assistance Bed Mobility: Supine to Sit, Sit to Supine     Supine to sit: Min assist, Mod assist Sit to  supine: Min assist   General bed mobility comments: pt does require assistance to exit/return to bed. Vcs for technique and adhering to precautions    Transfers Overall transfer level: Needs assistance Equipment used: Rolling walker (2 wheels) Transfers: Sit to/from Stand Sit to Stand: Min assist, Mod assist, From elevated surface           General transfer comment: pt's height make transfers more difficult to adhere precautions. he does require assistance to stand even form elevated surface heights    Ambulation/Gait Ambulation/Gait assistance: Supervision Gait Distance (Feet): 200 Feet Assistive device: Rolling walker (2 wheels) Gait Pattern/deviations: Step-through pattern Gait velocity: decreased     General Gait Details: no LOB however does require constant vcs for toe out and posture correction.   Stairs Stairs: Yes Stairs assistance: Supervision Stair Management: No rails, Forwards, Step to pattern, With walker Number of Stairs: 1 General stair comments: pt only has small thresholld to enter home. easily able to perform ascending/decsending 1 step with RW without difficulty    Balance Overall balance assessment: Needs assistance Sitting-balance support: Bilateral upper extremity supported Sitting balance-Leahy Scale: Good     Standing balance support: Bilateral upper extremity supported Standing balance-Leahy Scale: Good Standing balance comment: no balance with BUE support.      Cognition Arousal/Alertness: Awake/alert Behavior During Therapy: WFL for tasks assessed/performed Overall Cognitive Status: Within Functional Limits for tasks assessed      General Comments: very pleasant and agreeable               Pertinent Vitals/Pain Pain Assessment Pain Assessment: 0-10 Pain Score: 3  Pain Location: L hip Pain Descriptors /  Indicators: Operative site guarding Pain Intervention(s): Limited activity within patient's tolerance, Monitored during  session, Premedicated before session, Repositioned, Ice applied     PT Goals (current goals can now be found in the care plan section) Acute Rehab PT Goals Patient Stated Goal: get stronger and go home Progress towards PT goals: Progressing toward goals    Frequency    BID      PT Plan Current plan remains appropriate       AM-PAC PT "6 Clicks" Mobility   Outcome Measure  Help needed turning from your back to your side while in a flat bed without using bedrails?: A Little Help needed moving from lying on your back to sitting on the side of a flat bed without using bedrails?: A Little Help needed moving to and from a bed to a chair (including a wheelchair)?: A Little Help needed standing up from a chair using your arms (e.g., wheelchair or bedside chair)?: A Lot Help needed to walk in hospital room?: A Little Help needed climbing 3-5 steps with a railing? : A Little 6 Click Score: 17    End of Session Equipment Utilized During Treatment: Gait belt Activity Tolerance: Patient tolerated treatment well Patient left: in bed;with family/visitor present;with SCD's reapplied Nurse Communication: Mobility status PT Visit Diagnosis: Muscle weakness (generalized) (M62.81);Difficulty in walking, not elsewhere classified (R26.2);Pain Pain - Right/Left: Left Pain - part of body: Hip     Time: 8937-3428 PT Time Calculation (min) (ACUTE ONLY): 38 min  Charges:  $Gait Training: 23-37 mins $Therapeutic Activity: 8-22 mins                    Julaine Fusi PTA 12/16/21, 11:05 AM

## 2021-12-16 NOTE — Progress Notes (Signed)
Occupational Therapy Treatment Patient Details Name: Miguel Rios MRN: [id number] DOB: 02-19-1946 Today's Date: 12/16/2021   History of present illness 76 y/o male s/p L total hip replacement (posterior approach) 12/14/21,  h/o R total hip in 2014.   OT comments  Upon entering the room, pt supine in bed and reports having just finished PT and is excited about returning home today. Pt requested B UE strengthening HEP and OT provided pt with paper handout of exercises. OT demonstrates exercises and pt returns with min cuing for proper technique. Pt utilized red, level 2, resistive band for 2 sets of 10 chest pulls, shoulder diagonals, shoulder elevation, bicep curls, alternating punches, and lateral pull downs. OT discussed how to grade exercises to target different muscle groups but mainly just encouraging pt to utilize on his own. Pt is agreeable and thankful to have HEP to take home. Pt also reporting and showing pictures of home modifications with equipment that have been done in order to maintain hip precautions. Pt making excellent progress towards goals and with safety home environment for discharge.    Recommendations for follow up therapy are one component of a multi-disciplinary discharge planning process, led by the attending physician.  Recommendations may be updated based on patient status, additional functional criteria and insurance authorization.    Follow Up Recommendations  Home health OT    Assistance Recommended at Discharge Intermittent Supervision/Assistance  Patient can return home with the following  A little help with walking and/or transfers;A little help with bathing/dressing/bathroom;Help with stairs or ramp for entrance;Other (comment)   Equipment Recommendations  BSC/3in1       Precautions / Restrictions Precautions Precautions: Posterior Hip;Fall Restrictions Weight Bearing Restrictions: Yes LLE Weight Bearing: Weight bearing as tolerated               ADL either performed or assessed with clinical judgement    Extremity/Trunk Assessment Upper Extremity Assessment Upper Extremity Assessment: Generalized weakness            Vision Patient Visual Report: No change from baseline            Cognition Arousal/Alertness: Awake/alert Behavior During Therapy: WFL for tasks assessed/performed Overall Cognitive Status: Within Functional Limits for tasks assessed                                 General Comments: very pleasant and agreeable                   Pertinent Vitals/ Pain       Pain Assessment Pain Assessment: No/denies pain         Frequency  Min 2X/week        Progress Toward Goals  OT Goals(current goals can now be found in the care plan section)  Progress towards OT goals: Progressing toward goals  Acute Rehab OT Goals Patient Stated Goal: to go home today OT Goal Formulation: With patient Time For Goal Achievement: 12/29/21 Potential to Achieve Goals: [place] Discharge plan remains appropriate;Frequency remains appropriate       AM-PAC OT "6 Clicks" Daily Activity     Outcome Measure   Help from another person eating meals?: None Help from another person taking care of personal grooming?: None Help from another person toileting, which includes using toliet, bedpan, or urinal?: A Little Help from another person bathing (including washing, rinsing, drying)?: A Little Help from another person to put on  and taking off regular upper body clothing?: None Help from another person to put on and taking off regular lower body clothing?: A Little 6 Click Score: 21    End of Session Equipment Utilized During Treatment: Rolling walker (2 wheels)  OT Visit Diagnosis: Unsteadiness on feet (R26.81);Muscle weakness (generalized) (M62.81)   Activity Tolerance Patient tolerated treatment well   Patient Left in bed;with call bell/phone within reach;with bed alarm set;with family/visitor  present   Nurse Communication          Time: 9022-8406 OT Time Calculation (min): 23 min  Charges: OT General Charges $OT Visit: 1 Visit OT Treatments $Therapeutic Exercise: 23-37 mins  Darleen Crocker, MS, OTR/L , CBIS ascom [phone]  12/16/21, 10:27 AM

## 2021-12-16 NOTE — Progress Notes (Signed)
°  Subjective: 2 Days Post-Op Procedure(s) (LRB): TOTAL HIP ARTHROPLASTY (Left) Patient reports pain as well-controlled.   Patient is well, and has had no acute complaints or problems Plan is to go Home after hospital stay. Negative for chest pain and shortness of breath Fever: no Gastrointestinal: negative for nausea and vomiting.   Patient has had a bowel movement.  Objective: Vital signs in last 24 hours: Temp:  [97.9 F (36.6 C)-98.5 F (36.9 C)] 98.1 F (36.7 C) (01/20 0749) Pulse Rate:  [74-92] 86 (01/20 0749) Resp:  [16-18] 16 (01/20 0749) BP: (138-154)/(68-75) 151/75 (01/20 0749) SpO2:  [96 %-98 %] 96 % (01/20 0749)  Intake/Output from previous day:  Intake/Output Summary (Last 24 hours) at 12/16/2021 0825 Last data filed at 12/15/2021 2108 Gross per 24 hour  Intake 0 ml  Output 1600 ml  Net -1600 ml    Intake/Output this shift: No intake/output data recorded.  Labs: No results for input(s): HGB in the last 72 hours. No results for input(s): WBC, RBC, HCT, PLT in the last 72 hours. No results for input(s): NA, K, CL, CO2, BUN, CREATININE, GLUCOSE, CALCIUM in the last 72 hours. No results for input(s): LABPT, INR in the last 72 hours.   EXAM General - Patient is Alert, Appropriate, and Oriented Extremity - Neurovascular intact Dorsiflexion/Plantar flexion intact Compartment soft Dressing/Incision -clean, dry, mild bloody drainage  Motor Function - intact, moving foot and toes well on exam.    Cardiovascular- Regular rate and rhythm, no murmurs/rubs/gallops Respiratory- Lungs clear to auscultation bilaterally Gastrointestinal- soft and nontender   Assessment/Plan: 2 Days Post-Op Procedure(s) (LRB): TOTAL HIP ARTHROPLASTY (Left) Principal Problem:   Hx of total hip arthroplasty, left  Estimated body mass index is 31.21 kg/m as calculated from the following:   Height as of this encounter: 6\' 7"  (2.007 m).   Weight as of this encounter: 125.6 kg. Advance  diet Up with therapy  Plan on d/c after AM therapy session     DVT Prophylaxis - Lovenox, Ted hose, and SCDs Weight-Bearing as tolerated to left leg  Cassell Smiles, PA-C [facility]opa[facility] 12/16/2021, 8:25 AM

## 2021-12-16 NOTE — Progress Notes (Signed)
NT and LPN attempted to provide hygiene care and linen change. Pt refused. Nurse educated pt.

## 2021-12-16 NOTE — Progress Notes (Signed)
°   12/16/21 1100  Clinical Encounter Type  Visited With Patient  Visit Type Follow-up;Post-op   Chaplain visited patient in pre-op and wanted to provide follow up support post-op

## 2021-12-19 LAB — SURGICAL PATHOLOGY

## 2021-12-27 DIAGNOSIS — C9001 Multiple myeloma in remission: Principal | ICD-10-CM

## 2022-01-04 DIAGNOSIS — Z96642 Presence of left artificial hip joint: Principal | ICD-10-CM

## 2022-01-05 DIAGNOSIS — C9001 Multiple myeloma in remission: Principal | ICD-10-CM

## 2022-01-06 MED ORDER — LENALIDOMIDE 10 MG CAPSULE
ORAL_CAPSULE | Freq: Every day | ORAL | 0 refills | 28 days | Status: CP
Start: 2022-01-06 — End: 2022-02-03

## 2022-01-10 ENCOUNTER — Ambulatory Visit: Admit: 2022-01-10 | Discharge: 2022-01-10 | Payer: MEDICARE

## 2022-01-10 DIAGNOSIS — R5382 Chronic fatigue, unspecified: Principal | ICD-10-CM

## 2022-01-10 DIAGNOSIS — Z9484 Stem cells transplant status: Principal | ICD-10-CM

## 2022-01-10 DIAGNOSIS — C9000 Multiple myeloma not having achieved remission: Principal | ICD-10-CM

## 2022-01-10 DIAGNOSIS — C9001 Multiple myeloma in remission: Principal | ICD-10-CM

## 2022-01-13 ENCOUNTER — Institutional Professional Consult (permissible substitution): Admit: 2022-01-13 | Discharge: 2022-01-14 | Payer: MEDICARE

## 2022-01-13 DIAGNOSIS — N400 Enlarged prostate without lower urinary tract symptoms: Principal | ICD-10-CM

## 2022-01-13 DIAGNOSIS — K828 Other specified diseases of gallbladder: Principal | ICD-10-CM

## 2022-01-13 MED ORDER — TAMSULOSIN 0.4 MG CAPSULE
ORAL_CAPSULE | Freq: Every day | ORAL | 0 refills | 90 days | Status: CP
Start: 2022-01-13 — End: ?

## 2022-01-30 ENCOUNTER — Ambulatory Visit
Admit: 2022-01-30 | Payer: MEDICARE | Attending: Rehabilitative and Restorative Service Providers" | Primary: Rehabilitative and Restorative Service Providers"

## 2022-01-30 ENCOUNTER — Ambulatory Visit
Admit: 2022-01-30 | Discharge: 2022-02-28 | Payer: MEDICARE | Attending: Rehabilitative and Restorative Service Providers" | Primary: Rehabilitative and Restorative Service Providers"

## 2022-01-30 DIAGNOSIS — Z9484 Stem cells transplant status: Principal | ICD-10-CM

## 2022-01-30 DIAGNOSIS — C9000 Multiple myeloma not having achieved remission: Principal | ICD-10-CM

## 2022-01-30 DIAGNOSIS — K219 Gastro-esophageal reflux disease without esophagitis: Principal | ICD-10-CM

## 2022-01-30 DIAGNOSIS — Z23 Encounter for immunization: Principal | ICD-10-CM

## 2022-01-30 DIAGNOSIS — C9001 Multiple myeloma in remission: Principal | ICD-10-CM

## 2022-01-31 MED ORDER — OMEPRAZOLE 40 MG CAPSULE,DELAYED RELEASE
ORAL_CAPSULE | Freq: Every day | ORAL | 0 refills | 90 days | Status: CP
Start: 2022-01-31 — End: ?

## 2022-02-05 DIAGNOSIS — R5382 Chronic fatigue, unspecified: Principal | ICD-10-CM

## 2022-02-05 DIAGNOSIS — C9001 Multiple myeloma in remission: Principal | ICD-10-CM

## 2022-02-05 DIAGNOSIS — Z9484 Stem cells transplant status: Principal | ICD-10-CM

## 2022-02-06 DIAGNOSIS — C9001 Multiple myeloma in remission: Principal | ICD-10-CM

## 2022-02-06 MED ORDER — LENALIDOMIDE 10 MG CAPSULE
ORAL_CAPSULE | Freq: Every day | ORAL | 0 refills | 28 days | Status: CP
Start: 2022-02-06 — End: 2022-03-06

## 2022-02-07 ENCOUNTER — Ambulatory Visit: Admit: 2022-02-07 | Discharge: 2022-02-07 | Payer: MEDICARE

## 2022-02-07 ENCOUNTER — Institutional Professional Consult (permissible substitution): Admit: 2022-02-07 | Discharge: 2022-02-07 | Payer: MEDICARE

## 2022-02-07 DIAGNOSIS — R5382 Chronic fatigue, unspecified: Principal | ICD-10-CM

## 2022-02-07 DIAGNOSIS — Z23 Encounter for immunization: Principal | ICD-10-CM

## 2022-02-07 DIAGNOSIS — Z9484 Stem cells transplant status: Principal | ICD-10-CM

## 2022-02-07 DIAGNOSIS — C9001 Multiple myeloma in remission: Principal | ICD-10-CM

## 2022-02-24 MED ORDER — NYSTATIN 100,000 UNIT/ML ORAL SUSPENSION
Freq: Four times a day (QID) | ORAL | 0 refills | 7 days | Status: CP
Start: 2022-02-24 — End: ?

## 2022-03-01 DIAGNOSIS — C9001 Multiple myeloma in remission: Principal | ICD-10-CM

## 2022-03-02 MED ORDER — LENALIDOMIDE 10 MG CAPSULE
ORAL_CAPSULE | Freq: Every day | ORAL | 0 refills | 28 days | Status: CP
Start: 2022-03-02 — End: 2022-03-30

## 2022-03-03 ENCOUNTER — Ambulatory Visit
Admit: 2022-03-03 | Payer: MEDICARE | Attending: Rehabilitative and Restorative Service Providers" | Primary: Rehabilitative and Restorative Service Providers"

## 2022-03-03 ENCOUNTER — Ambulatory Visit
Admit: 2022-03-03 | Discharge: 2022-03-30 | Payer: MEDICARE | Attending: Rehabilitative and Restorative Service Providers" | Primary: Rehabilitative and Restorative Service Providers"

## 2022-03-03 ENCOUNTER — Ambulatory Visit: Admit: 2022-03-03 | Payer: MEDICARE

## 2022-03-07 ENCOUNTER — Ambulatory Visit: Admit: 2022-03-07 | Discharge: 2022-03-07 | Payer: MEDICARE

## 2022-03-07 DIAGNOSIS — C9001 Multiple myeloma in remission: Principal | ICD-10-CM

## 2022-03-08 ENCOUNTER — Ambulatory Visit: Admit: 2022-03-08 | Discharge: 2022-03-09 | Payer: MEDICARE

## 2022-03-08 DIAGNOSIS — R972 Elevated prostate specific antigen [PSA]: Principal | ICD-10-CM

## 2022-03-08 MED ORDER — SILDENAFIL (PULMONARY HYPERTENSION) 20 MG TABLET
ORAL_TABLET | Freq: Every day | ORAL | 3 refills | 18 days | Status: CP | PRN
Start: 2022-03-08 — End: ?

## 2022-03-14 DIAGNOSIS — C9000 Multiple myeloma not having achieved remission: Principal | ICD-10-CM

## 2022-03-14 DIAGNOSIS — R195 Other fecal abnormalities: Principal | ICD-10-CM

## 2022-03-14 DIAGNOSIS — R197 Diarrhea, unspecified: Principal | ICD-10-CM

## 2022-03-14 MED ORDER — CHOLESTYRAMINE (WITH SUGAR) 4 GRAM POWDER FOR SUSP IN A PACKET
Freq: Every day | ORAL | 2 refills | 60 days | Status: CP
Start: 2022-03-14 — End: ?

## 2022-03-29 DIAGNOSIS — C9001 Multiple myeloma in remission: Principal | ICD-10-CM

## 2022-03-29 DIAGNOSIS — R5382 Chronic fatigue, unspecified: Principal | ICD-10-CM

## 2022-03-29 DIAGNOSIS — Z9484 Stem cells transplant status: Principal | ICD-10-CM

## 2022-03-30 DIAGNOSIS — C9001 Multiple myeloma in remission: Principal | ICD-10-CM

## 2022-03-30 MED ORDER — LENALIDOMIDE 10 MG CAPSULE
ORAL_CAPSULE | Freq: Every day | ORAL | 0 refills | 28 days | Status: CP
Start: 2022-03-30 — End: 2022-04-27

## 2022-04-04 ENCOUNTER — Ambulatory Visit: Admit: 2022-04-04 | Discharge: 2022-04-04 | Payer: MEDICARE

## 2022-04-04 DIAGNOSIS — D649 Anemia, unspecified: Principal | ICD-10-CM

## 2022-04-05 DIAGNOSIS — E538 Deficiency of other specified B group vitamins: Principal | ICD-10-CM

## 2022-04-05 DIAGNOSIS — E611 Iron deficiency: Principal | ICD-10-CM

## 2022-04-05 MED ORDER — CYANOCOBALAMIN (VIT B-12) 1,000 MCG SUBLINGUAL LOZENGE
LOZENGE | Freq: Every day | SUBLINGUAL | 0 refills | 0 days | Status: CP
Start: 2022-04-05 — End: 2022-06-04

## 2022-04-05 MED ORDER — FERROUS SULFATE 325 MG (65 MG IRON) TABLET
ORAL_TABLET | Freq: Every day | ORAL | 3 refills | 30 days | Status: CP
Start: 2022-04-05 — End: 2023-04-05

## 2022-04-06 ENCOUNTER — Ambulatory Visit
Admit: 2022-04-06 | Payer: MEDICARE | Attending: Rehabilitative and Restorative Service Providers" | Primary: Rehabilitative and Restorative Service Providers"

## 2022-04-06 ENCOUNTER — Ambulatory Visit
Admit: 2022-04-06 | Discharge: 2022-04-29 | Payer: MEDICARE | Attending: Rehabilitative and Restorative Service Providers" | Primary: Rehabilitative and Restorative Service Providers"

## 2022-04-17 DIAGNOSIS — N400 Enlarged prostate without lower urinary tract symptoms: Principal | ICD-10-CM

## 2022-04-28 DIAGNOSIS — C9001 Multiple myeloma in remission: Principal | ICD-10-CM

## 2022-04-28 DIAGNOSIS — C9000 Multiple myeloma not having achieved remission: Principal | ICD-10-CM

## 2022-04-28 DIAGNOSIS — K219 Gastro-esophageal reflux disease without esophagitis: Principal | ICD-10-CM

## 2022-04-28 MED ORDER — LENALIDOMIDE 10 MG CAPSULE
ORAL_CAPSULE | Freq: Every day | ORAL | 0 refills | 28 days | Status: CP
Start: 2022-04-28 — End: 2022-05-26

## 2022-04-28 MED ORDER — OMEPRAZOLE 40 MG CAPSULE,DELAYED RELEASE
ORAL_CAPSULE | Freq: Every day | ORAL | 0 refills | 90 days | Status: CP
Start: 2022-04-28 — End: ?

## 2022-04-30 DIAGNOSIS — N400 Enlarged prostate without lower urinary tract symptoms: Principal | ICD-10-CM

## 2022-04-30 DIAGNOSIS — R197 Diarrhea, unspecified: Principal | ICD-10-CM

## 2022-04-30 DIAGNOSIS — R195 Other fecal abnormalities: Principal | ICD-10-CM

## 2022-04-30 DIAGNOSIS — C9000 Multiple myeloma not having achieved remission: Principal | ICD-10-CM

## 2022-05-01 DIAGNOSIS — C9001 Multiple myeloma in remission: Principal | ICD-10-CM

## 2022-05-01 MED ORDER — TAMSULOSIN 0.4 MG CAPSULE
ORAL_CAPSULE | Freq: Every day | ORAL | 0 refills | 90 days | Status: CP
Start: 2022-05-01 — End: ?

## 2022-05-01 MED ORDER — CHOLESTYRAMINE (WITH SUGAR) 4 GRAM POWDER FOR SUSP IN A PACKET
Freq: Every day | ORAL | 2 refills | 60 days | Status: CP
Start: 2022-05-01 — End: ?

## 2022-05-02 ENCOUNTER — Ambulatory Visit: Admit: 2022-05-02 | Discharge: 2022-05-02 | Payer: MEDICARE

## 2022-05-02 ENCOUNTER — Institutional Professional Consult (permissible substitution): Admit: 2022-05-02 | Discharge: 2022-05-02 | Payer: MEDICARE

## 2022-05-02 DIAGNOSIS — Z9484 Stem cells transplant status: Principal | ICD-10-CM

## 2022-05-02 DIAGNOSIS — C9001 Multiple myeloma in remission: Principal | ICD-10-CM

## 2022-05-02 DIAGNOSIS — R5382 Chronic fatigue, unspecified: Principal | ICD-10-CM

## 2022-05-02 MED ORDER — VALACYCLOVIR 500 MG TABLET
ORAL_TABLET | Freq: Every day | ORAL | 0 refills | 30 days | Status: CP
Start: 2022-05-02 — End: 2022-06-01

## 2022-05-03 ENCOUNTER — Ambulatory Visit: Admit: 2022-05-03 | Discharge: 2022-05-04 | Payer: MEDICARE

## 2022-05-03 ENCOUNTER — Ambulatory Visit: Admit: 2022-05-03 | Discharge: 2022-05-04 | Payer: MEDICARE | Attending: Podiatrist | Primary: Podiatrist

## 2022-05-03 DIAGNOSIS — S90122A Contusion of left lesser toe(s) without damage to nail, initial encounter: Principal | ICD-10-CM

## 2022-05-03 MED ORDER — CEPHALEXIN 500 MG CAPSULE
ORAL_CAPSULE | Freq: Three times a day (TID) | ORAL | 0 refills | 7 days | Status: CP
Start: 2022-05-03 — End: 2022-05-10

## 2022-05-04 ENCOUNTER — Ambulatory Visit
Admit: 2022-05-04 | Payer: MEDICARE | Attending: Rehabilitative and Restorative Service Providers" | Primary: Rehabilitative and Restorative Service Providers"

## 2022-05-04 ENCOUNTER — Ambulatory Visit
Admit: 2022-05-04 | Discharge: 2022-05-29 | Payer: MEDICARE | Attending: Rehabilitative and Restorative Service Providers" | Primary: Rehabilitative and Restorative Service Providers"

## 2022-05-25 MED ORDER — LENALIDOMIDE 10 MG CAPSULE
ORAL_CAPSULE | Freq: Every day | ORAL | 0 refills | 28 days | Status: CP
Start: 2022-05-25 — End: 2022-06-22

## 2022-05-31 ENCOUNTER — Ambulatory Visit: Admit: 2022-05-31 | Discharge: 2022-05-31 | Payer: MEDICARE

## 2022-05-31 DIAGNOSIS — C9001 Multiple myeloma in remission: Principal | ICD-10-CM

## 2022-06-01 ENCOUNTER — Ambulatory Visit
Admit: 2022-06-01 | Discharge: 2022-06-28 | Payer: MEDICARE | Attending: Rehabilitative and Restorative Service Providers" | Primary: Rehabilitative and Restorative Service Providers"

## 2022-06-01 ENCOUNTER — Ambulatory Visit
Admit: 2022-06-01 | Payer: MEDICARE | Attending: Rehabilitative and Restorative Service Providers" | Primary: Rehabilitative and Restorative Service Providers"

## 2022-06-02 DIAGNOSIS — C9001 Multiple myeloma in remission: Principal | ICD-10-CM

## 2022-06-02 MED ORDER — VALACYCLOVIR 500 MG TABLET
ORAL_TABLET | Freq: Every day | ORAL | 0 refills | 90 days | Status: CP
Start: 2022-06-02 — End: 2022-08-31

## 2022-06-22 DIAGNOSIS — C9001 Multiple myeloma in remission: Principal | ICD-10-CM

## 2022-06-22 MED ORDER — LENALIDOMIDE 10 MG CAPSULE
ORAL_CAPSULE | Freq: Every day | ORAL | 0 refills | 28 days | Status: CP
Start: 2022-06-22 — End: 2022-07-20

## 2022-06-27 ENCOUNTER — Ambulatory Visit: Admit: 2022-06-27 | Discharge: 2022-06-27 | Payer: MEDICARE

## 2022-06-27 DIAGNOSIS — Z9484 Stem cells transplant status: Principal | ICD-10-CM

## 2022-06-27 DIAGNOSIS — R5382 Chronic fatigue, unspecified: Principal | ICD-10-CM

## 2022-06-27 DIAGNOSIS — C9001 Multiple myeloma in remission: Principal | ICD-10-CM

## 2022-07-13 ENCOUNTER — Ambulatory Visit
Admit: 2022-07-13 | Payer: MEDICARE | Attending: Rehabilitative and Restorative Service Providers" | Primary: Rehabilitative and Restorative Service Providers"

## 2022-07-17 DIAGNOSIS — C9001 Multiple myeloma in remission: Principal | ICD-10-CM

## 2022-07-17 MED ORDER — LENALIDOMIDE 10 MG CAPSULE
ORAL_CAPSULE | Freq: Every day | ORAL | 0 refills | 28 days | Status: CP
Start: 2022-07-17 — End: 2022-08-14

## 2022-07-25 ENCOUNTER — Ambulatory Visit: Admit: 2022-07-25 | Discharge: 2022-07-25 | Payer: MEDICARE

## 2022-07-27 DIAGNOSIS — K219 Gastro-esophageal reflux disease without esophagitis: Principal | ICD-10-CM

## 2022-07-27 DIAGNOSIS — C9000 Multiple myeloma not having achieved remission: Principal | ICD-10-CM

## 2022-07-27 MED ORDER — OMEPRAZOLE 40 MG CAPSULE,DELAYED RELEASE
ORAL_CAPSULE | Freq: Every day | ORAL | 0 refills | 90 days | Status: CP
Start: 2022-07-27 — End: ?

## 2022-07-31 DIAGNOSIS — C9001 Multiple myeloma in remission: Principal | ICD-10-CM

## 2022-08-03 ENCOUNTER — Ambulatory Visit
Admit: 2022-08-03 | Discharge: 2022-08-27 | Payer: MEDICARE | Attending: Rehabilitative and Restorative Service Providers" | Primary: Rehabilitative and Restorative Service Providers"

## 2022-08-03 ENCOUNTER — Ambulatory Visit
Admit: 2022-08-03 | Payer: MEDICARE | Attending: Rehabilitative and Restorative Service Providers" | Primary: Rehabilitative and Restorative Service Providers"

## 2022-08-04 DIAGNOSIS — N400 Enlarged prostate without lower urinary tract symptoms: Principal | ICD-10-CM

## 2022-08-04 MED ORDER — TAMSULOSIN 0.4 MG CAPSULE
ORAL_CAPSULE | Freq: Every day | ORAL | 0 refills | 90 days | Status: CP
Start: 2022-08-04 — End: ?

## 2022-08-07 MED ORDER — DILTIAZEM CD 120 MG CAPSULE,EXTENDED RELEASE 24 HR
ORAL_CAPSULE | Freq: Every day | ORAL | 3 refills | 90 days | Status: CP
Start: 2022-08-07 — End: 2023-08-02

## 2022-08-10 DIAGNOSIS — C9001 Multiple myeloma in remission: Principal | ICD-10-CM

## 2022-08-10 MED ORDER — LENALIDOMIDE 10 MG CAPSULE
ORAL_CAPSULE | Freq: Every day | ORAL | 0 refills | 28 days | Status: CP
Start: 2022-08-10 — End: 2022-09-07

## 2022-08-16 DIAGNOSIS — C9001 Multiple myeloma in remission: Principal | ICD-10-CM

## 2022-08-22 ENCOUNTER — Ambulatory Visit: Admit: 2022-08-22 | Discharge: 2022-08-22 | Payer: MEDICARE

## 2022-08-22 ENCOUNTER — Other Ambulatory Visit: Admit: 2022-08-22 | Discharge: 2022-08-22 | Payer: MEDICARE

## 2022-08-22 DIAGNOSIS — C9001 Multiple myeloma in remission: Principal | ICD-10-CM

## 2022-08-22 DIAGNOSIS — I722 Aneurysm of renal artery: Principal | ICD-10-CM

## 2022-08-23 ENCOUNTER — Ambulatory Visit: Admit: 2022-08-23 | Discharge: 2022-08-24 | Payer: MEDICARE

## 2022-08-23 DIAGNOSIS — C9001 Multiple myeloma in remission: Principal | ICD-10-CM

## 2022-08-23 DIAGNOSIS — Z9484 Stem cells transplant status: Principal | ICD-10-CM

## 2022-08-23 DIAGNOSIS — R5382 Chronic fatigue, unspecified: Principal | ICD-10-CM

## 2022-08-25 ENCOUNTER — Ambulatory Visit: Admit: 2022-08-25 | Discharge: 2022-08-26 | Payer: MEDICARE

## 2022-08-30 DIAGNOSIS — C9001 Multiple myeloma in remission: Principal | ICD-10-CM

## 2022-08-30 MED ORDER — VALACYCLOVIR 500 MG TABLET
ORAL_TABLET | Freq: Every day | ORAL | 0 refills | 90 days | Status: CP
Start: 2022-08-30 — End: 2022-11-28

## 2022-09-04 DIAGNOSIS — C9001 Multiple myeloma in remission: Principal | ICD-10-CM

## 2022-09-04 MED ORDER — LENALIDOMIDE 10 MG CAPSULE
ORAL_CAPSULE | Freq: Every day | ORAL | 0 refills | 28 days | Status: CP
Start: 2022-09-04 — End: 2022-10-02

## 2022-09-19 ENCOUNTER — Ambulatory Visit: Admit: 2022-09-19 | Discharge: 2022-09-19 | Payer: MEDICARE

## 2022-09-19 DIAGNOSIS — C9001 Multiple myeloma in remission: Principal | ICD-10-CM

## 2022-09-26 ENCOUNTER — Ambulatory Visit: Admit: 2022-09-26 | Discharge: 2022-09-27 | Payer: MEDICARE

## 2022-09-26 DIAGNOSIS — C9001 Multiple myeloma in remission: Principal | ICD-10-CM

## 2022-10-03 ENCOUNTER — Ambulatory Visit: Admit: 2022-10-03 | Discharge: 2022-10-04 | Payer: MEDICARE

## 2022-10-03 DIAGNOSIS — C9000 Multiple myeloma not having achieved remission: Principal | ICD-10-CM

## 2022-10-06 ENCOUNTER — Ambulatory Visit: Admit: 2022-10-06 | Discharge: 2022-10-06 | Payer: MEDICARE

## 2022-10-06 ENCOUNTER — Institutional Professional Consult (permissible substitution): Admit: 2022-10-06 | Discharge: 2022-10-06 | Payer: MEDICARE

## 2022-10-06 DIAGNOSIS — C9000 Multiple myeloma not having achieved remission: Principal | ICD-10-CM

## 2022-10-09 ENCOUNTER — Ambulatory Visit: Admit: 2022-10-09 | Discharge: 2022-10-10 | Payer: MEDICARE

## 2022-10-09 ENCOUNTER — Encounter: Admit: 2022-10-09 | Discharge: 2022-10-10 | Payer: MEDICARE

## 2022-10-09 ENCOUNTER — Other Ambulatory Visit: Admit: 2022-10-09 | Discharge: 2022-10-10 | Payer: MEDICARE

## 2022-10-09 DIAGNOSIS — C9000 Multiple myeloma not having achieved remission: Principal | ICD-10-CM

## 2022-10-09 DIAGNOSIS — C9001 Multiple myeloma in remission: Principal | ICD-10-CM

## 2022-10-16 ENCOUNTER — Ambulatory Visit: Admit: 2022-10-16 | Discharge: 2022-10-17 | Payer: MEDICARE

## 2022-10-17 ENCOUNTER — Ambulatory Visit: Admit: 2022-10-17 | Discharge: 2022-10-17 | Payer: MEDICARE

## 2022-10-17 DIAGNOSIS — D61818 Other pancytopenia: Principal | ICD-10-CM

## 2022-10-17 DIAGNOSIS — R718 Other abnormality of red blood cells: Principal | ICD-10-CM

## 2022-10-17 DIAGNOSIS — C9001 Multiple myeloma in remission: Principal | ICD-10-CM

## 2022-10-17 MED ORDER — CYANOCOBALAMIN (VIT B-12) 2,000 MCG TABLET
ORAL_TABLET | Freq: Every day | ORAL | 3 refills | 0 days | Status: CP
Start: 2022-10-17 — End: ?

## 2022-10-24 ENCOUNTER — Institutional Professional Consult (permissible substitution): Admit: 2022-10-24 | Discharge: 2022-10-24 | Payer: MEDICARE

## 2022-10-24 ENCOUNTER — Ambulatory Visit: Admit: 2022-10-24 | Discharge: 2022-10-24 | Payer: MEDICARE

## 2022-10-24 DIAGNOSIS — C9000 Multiple myeloma not having achieved remission: Principal | ICD-10-CM

## 2022-10-24 DIAGNOSIS — D61818 Other pancytopenia: Principal | ICD-10-CM

## 2022-10-24 DIAGNOSIS — C9200 Acute myeloblastic leukemia, not having achieved remission: Principal | ICD-10-CM

## 2022-10-24 DIAGNOSIS — C9001 Multiple myeloma in remission: Principal | ICD-10-CM

## 2022-10-25 DIAGNOSIS — C9000 Multiple myeloma not having achieved remission: Principal | ICD-10-CM

## 2022-10-26 ENCOUNTER — Ambulatory Visit: Admit: 2022-10-26 | Discharge: 2022-10-27 | Payer: MEDICARE

## 2022-10-26 ENCOUNTER — Encounter: Admit: 2022-10-26 | Discharge: 2022-10-27 | Payer: MEDICARE

## 2022-10-26 ENCOUNTER — Other Ambulatory Visit: Admit: 2022-10-26 | Discharge: 2022-10-27 | Payer: MEDICARE

## 2022-10-26 ENCOUNTER — Ambulatory Visit: Admit: 2022-10-26 | Discharge: 2022-10-27 | Payer: MEDICARE | Attending: Adult Health | Primary: Adult Health

## 2022-10-31 ENCOUNTER — Institutional Professional Consult (permissible substitution): Admit: 2022-10-31 | Discharge: 2022-11-01 | Payer: MEDICARE

## 2022-10-31 ENCOUNTER — Ambulatory Visit: Admit: 2022-10-31 | Discharge: 2022-11-01 | Payer: MEDICARE

## 2022-10-31 DIAGNOSIS — C9200 Acute myeloblastic leukemia, not having achieved remission: Principal | ICD-10-CM

## 2022-10-31 DIAGNOSIS — C9000 Multiple myeloma not having achieved remission: Principal | ICD-10-CM

## 2022-11-01 DIAGNOSIS — R197 Diarrhea, unspecified: Principal | ICD-10-CM

## 2022-11-01 DIAGNOSIS — R195 Other fecal abnormalities: Principal | ICD-10-CM

## 2022-11-01 DIAGNOSIS — C9000 Multiple myeloma not having achieved remission: Principal | ICD-10-CM

## 2022-11-01 MED ORDER — CHOLESTYRAMINE (WITH SUGAR) 4 GRAM POWDER FOR SUSP IN A PACKET
Freq: Every day | ORAL | 2 refills | 60.00000 days | Status: CP
Start: 2022-11-01 — End: ?

## 2022-11-02 ENCOUNTER — Other Ambulatory Visit: Admit: 2022-11-02 | Discharge: 2022-11-02 | Payer: MEDICARE

## 2022-11-02 ENCOUNTER — Ambulatory Visit: Admit: 2022-11-02 | Discharge: 2022-11-02 | Payer: MEDICARE | Attending: Hematology | Primary: Hematology

## 2022-11-02 DIAGNOSIS — C9200 Acute myeloblastic leukemia, not having achieved remission: Principal | ICD-10-CM

## 2022-11-03 ENCOUNTER — Ambulatory Visit: Admit: 2022-11-03 | Discharge: 2022-11-04 | Payer: MEDICARE

## 2022-11-03 ENCOUNTER — Institutional Professional Consult (permissible substitution): Admit: 2022-11-03 | Discharge: 2022-11-04 | Payer: MEDICARE

## 2022-11-03 DIAGNOSIS — C9000 Multiple myeloma not having achieved remission: Principal | ICD-10-CM

## 2022-11-03 DIAGNOSIS — R197 Diarrhea, unspecified: Principal | ICD-10-CM

## 2022-11-03 DIAGNOSIS — R195 Other fecal abnormalities: Principal | ICD-10-CM

## 2022-11-03 DIAGNOSIS — C9200 Acute myeloblastic leukemia, not having achieved remission: Principal | ICD-10-CM

## 2022-11-03 MED ORDER — CHOLESTYRAMINE (WITH SUGAR) 4 GRAM POWDER FOR SUSP IN A PACKET
2 refills | 0 days | Status: CP
Start: 2022-11-03 — End: ?

## 2022-11-03 MED ORDER — VENETOCLAX 100 MG TABLET
ORAL_TABLET | 1 refills | 0 days | Status: CP
Start: 2022-11-03 — End: ?
  Filled 2022-11-17: qty 120, 30d supply, fill #0

## 2022-11-04 NOTE — Unmapped (Signed)
.  Timothy Porter contacted the [employer] requesting to speak with the care team of Timothy Porter to discuss:    He is calling needing instructions on his new medication and when it will be delivered and this on the venclexta 100mg . 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     Please contact Ramon back  at [phone] asap.       Thank you,   Noland Fordyce  [facility]   [phone]

## 2022-11-06 DIAGNOSIS — C9200 Acute myeloblastic leukemia, not having achieved remission: Principal | ICD-10-CM

## 2022-11-07 ENCOUNTER — Ambulatory Visit: Admit: 2022-11-07 | Discharge: 2022-11-08 | Payer: MEDICARE

## 2022-11-07 ENCOUNTER — Institutional Professional Consult (permissible substitution): Admit: 2022-11-07 | Discharge: 2022-11-08 | Payer: MEDICARE

## 2022-11-07 DIAGNOSIS — C9000 Multiple myeloma not having achieved remission: Principal | ICD-10-CM

## 2022-11-09 ENCOUNTER — Ambulatory Visit: Admit: 2022-11-09 | Discharge: 2022-11-10 | Payer: MEDICARE | Attending: Hematology | Primary: Hematology

## 2022-11-09 ENCOUNTER — Other Ambulatory Visit: Admit: 2022-11-09 | Discharge: 2022-11-10 | Payer: MEDICARE

## 2022-11-09 NOTE — Unmapped (Signed)
Patient stated that he have a slight headache today.

## 2022-11-09 NOTE — Unmapped (Signed)
[facility] Leukemia Clinic Initial Visit Note     Patient Name: Timothy Porter  Patient Age: 76 y.o.  Encounter Date: 11/09/2022    Primary Care Provider:  Pcp, None Per Patient    Referring Physician:  Doreatha Lew, MD  [address]  [place],  [place] [address]    Reason for visit:  New diagnosis of AML    Current therapy:   None    Assessment/Plan:  Timothy Porter is a 76 y.o. 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 Likely treatment related with prior exposure to melphalan also lenalidomide.     Disease-specific prognosis: Median OS in real world AZA+VEN patients is about 1 year. I discussed with the patient that this is not a curable disease outside of a bone marrow transplant.     We discussed treatment with AZA+VEN. We also introduced the BEAT AML trial through which the patient could be randomized to reduced duration of venetoclax with each treatment cycle (14 vs 28 days). We discussed this trial in detail with the patient and his wife today. He has decided to pursue treatment on the BEAT AML trial.    The patient will need a repeat bone marrow biopsy for the clinical trial. This is currently scheduled for 11/15/21. The trial team will talk to him at that time and then he will proceed with treatment.     Free Kappa Chain MM: Treatment hx: RVD x4 (02/19/19-) - ASCT, mel 140 mg/m2 (05/29/19; VGPR) - Len maintenance, [id number] (10/22/19-08/2022; CR, MRD-neg).   - Continue to follow with myeloma team. Patient is scheduled to see Timothy Porter on 11/14/22    GERD: Continue omeprazole     Alternating constipation and diarrhea: Continue home cholestyramine     Psychosocial support: He demonstrates good coping and has good support to proceed with the management above  - Counseling given   - Consider ref to comprehensive cancer support program if needed    Patient-centered care/Shared decision-making:   We discussed the plan above at length. The patient actively contributed to the conversation. Specifically, his most important outcomes are:   - Living longer   - Maintaining his overall functional activity     Supportive Care Needs: We recommend based on the patient???s underlying diagnosis and treatment history the following supportive care:    1. Antimicrobial prophylaxis: We will revisit during our next appointment. Tentatively for AML (not in remission): Bacterial: Levofloxacin 500mg  PO daily (absolute neutrophils >/= 0.5 until absolute neutrophils >/= 0.5);   Fungal: AML fungal: Posaconazole 300mg  PO daily (absolute neutrophils >/= 0.5 until absolute neutrophils >/= 0.5) ;   Viral: Valacyclovir 500mg  PO daily (continuous). If posaconazole is to be used, venetoclax should be reduced to 100 mg a day.     2. Blood product support:  Leukoreduced blood products are required.  Irradiated blood products are preferred, but in case of urgent transfusion needs non-irradiated blood products may be used. In the outpatient setting our recommendations are as below:  - 1 unit pRBC for Hb < 8 or Hb < 9 and symptomatic  - 1 unit of platelets for platelets < 20    Timothy Bender, MD  Hematology/Oncology PGY4    Patient seen and discussed with,  Timothy Aloe, MD  Leukemia Program  Division of Hematology  [facility]    Nurse Navigator (non-clinical trial patients): Timothy Lamp, RN        Tel. [phone]       Fax. [phone]  Toll-free appointments: [phone]  Scheduling assistance: [phone]  After hours/weekends: [phone] (ask for adult hematology/oncology on-call)    Interval events:  The patient presents with his wife today. He endorses ongoing fatigue. He has mild decrease in appetite and lightheadedness. He denies fevers, chills, change in weight, night sweats, nausea, vomiting, rashes, unexplained bleeding/bruising, recurrent or unexplained infections, masses, swelling, or enlarged lymph nodes. He is eager to initiate treatment for AML.    History of Present Illness:   Timothy Porter is a 76 y.o. 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    The patient reports a history of fatigue ever since he was diagnosed with multiple myeloma. This worsened within the last several months. He was previously able to walk approximately 40 minutes and this has since decreased to 6 minutes. He was found to have progressive pancytopenia during this time. His lenalidomide was discontinued approximately 5 weeks ago. Bone marrow biopsy on 08/22/22 was notable for slightly hypocellular bone marrow (20%) with trilineage hematopoiesis without morphologic or overt immunophenotypic evidence of plasma cell neoplasm or leukemia. However, a MECOM rearrangement was subsequent identified and confirmed by cytogenetic analyses, giving concern for a high-grade myeloid neoplasm. Repeat bone marrow biopsy on 10/26/22 demonstrated a hypercellular bone marrow (50%) involved by acute myeloid leukemia with MECOM rearrangement (23% blasts by manual aspirate differential and 10-20% CD34-positive blasts by immunohistochemistry) with mild to focally moderate marrow fibrosis (MF-1 to 2) and less than 5% plasma cells by manual aspirate differential count.    His social history is notable for no history or tobacco use and rare alcohol use (1 glass of champagne a month). 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 He lives with his wife who is a Education officer, community. His son is an Designer, industrial/product.     No significant family history of cancer.     Oncology History is as below:   Oncology History Overview Note   Referring/Local Oncologist:  Timothy Porter     Diagnosis:   Diagnosis   Date Value Ref Range Status   10/26/2022   Final Bone marrow, left iliac, aspiration and biopsy  -  Hypercellular bone marrow (50%) involved by acute myeloid leukemia with MECOM rearrangement (23% blasts by manual aspirate differential and 10-20% CD34-positive blasts by immunohistochemistry)   -  Mild to focally moderate marrow fibrosis (MF-1 to 2)   -  Less than 5% plasma cells by manual aspirate differential count (see Comment)  -  See linked reports for associated Ancillary Studies.      This electronic signature is attestation that the pathologist personally reviewed the submitted material(s) and the final diagnosis reflects that evaluation.          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 11/04/18)  A Randomized Study of Daratumumab Plus Lenalidomide Versus Lenalidomide Alone as Maintenance Treatment in Patients with Newly Diagnosed Multiple Myeloma Who Are Minimal Residual Disease Positive After Frontline Autologous Stem Cell Transplant.     History of auto stem cell transplant (CMS-HCC)   08/05/2019 Initial Diagnosis    History of auto stem cell transplant (CMS-HCC)     10/22/2019 - 06/27/2022 Chemotherapy    STUDY [id number] IRB# [id number] LENALIDOMIDE (v. 11/04/18)  A Randomized Study of Daratumumab Plus Lenalidomide Versus Lenalidomide Alone as Maintenance Treatment in Patients with Newly Diagnosed Multiple Myeloma Who Are Minimal Residual Disease Positive After Frontline Autologous Stem Cell Transplant.     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)         Review of Systems:   ROS reviewed and negative except as noted in H and P     Allergies:  No Known Allergies    Medications:     Current Outpatient Medications:     calcium carbonate (TUMS) 200 mg calcium (500 mg) chewable tablet, Chew 2 tablets (400 mg of elem calcium total) Three (3) times a day., Disp: , Rfl:     cholecalciferol, vitamin D3 25 mcg, 1,000 units,, 1,000 unit (25 mcg) tablet, Take 1 tablet (25 mcg total) by mouth., Disp: , Rfl:     cholestyramine (QUESTRAN) 4 gram packet, Take 1 packet by mouth in the morning. Mix 4 g (1 packet) into 2-3 ounces of fluid and take twice a day.., Disp: 60 each, Rfl: 2    cholestyramine (QUESTRAN) 4 gram packet, TAKE 1 PACKET BY MOUTH EVERY MORNING. MIX 4 GRAMS INTO 2 TO 3 OZ OF FLUID AND TAKE TWICE DAILY, Disp: 60 each, Rfl: 2    cyanocobalamin, vitamin B-12, 2,000 mcg Tab, Take 2,000 mcg by mouth in the morning., Disp: 90 tablet, Rfl: 3    dilTIAZem (CARDIZEM CD) 120 MG 24 hr capsule, Take 1 capsule (120 mg total) by mouth daily., Disp: 90 capsule, Rfl: 3    diltiazem (TIAZAC) 120 MG 24 hr capsule, Take 1 capsule (120 mg total) by mouth daily., Disp: , Rfl:     ferrous sulfate 325 (65 FE) MG tablet, Take 1 tablet (325 mg total) by mouth daily., Disp: 30 tablet, Rfl: 3    fluticasone propionate (FLONASE) 50 mcg/actuation nasal spray, 1 spray into each nostril daily as needed., Disp: , Rfl:     loratadine (CLARITIN) 10 mg tablet, Take 1 tablet (10 mg total) by mouth daily., Disp: , Rfl:     metoprolol succinate (TOPROL-XL) 50 MG 24 hr tablet, Take 1 tablet (50 mg total) by mouth., Disp: , Rfl:     multivitamin with minerals tablet, Take 1 tablet by mouth daily., Disp: , Rfl:     omeprazole (PRILOSEC) 40 MG capsule, Take 1 capsule (40 mg total) by mouth daily., Disp: 90 capsule, Rfl: 0    sildenafiL, pulm.hypertension, (REVATIO) 20 mg tablet, Take 1-5 tablets (20-100 mg total) by mouth daily as needed (for erectile function)., Disp: 90 tablet, Rfl: 3    tamsulosin (FLOMAX) 0.4 mg capsule, Take 1 capsule (0.4 mg total) by mouth daily., Disp: 90 capsule, Rfl: 0  traZODone (DESYREL) 50 MG tablet, , Disp: , Rfl:     valACYclovir (VALTREX) 500 MG tablet, Take 1 tablet (500 mg total) by mouth daily for 90 doses., Disp: 90 tablet, Rfl: 0    venetoclax (VENCLEXTA) 100 mg tablet, Take 4 tablets (400 mg total) by mouth daily. Take with a meal and water. Do not chew, crush, or break tablets., Disp: 120 tablet, Rfl: 1  No current facility-administered medications for this visit.    Facility-Administered Medications Ordered in Other Visits:     DTaP-hepatitis B recombinant-IPV (PEDIARIX) 10 mcg-25Lf-25 mcg-10Lf/0.5 mL injection, , , ,     DTaP-hepatitis B recombinant-IPV (PEDIARIX) 10 mcg-25Lf-25 mcg-10Lf/0.5 mL injection, , , ,     haemophilus B polysac-tetanus toxoid (ActHIB) 10 mcg/0.5 mL injection, , , ,     influenza vaccine quad (FLUARIX, FLULAVAL, FLUZONE) (6 MOS & UP) 2020-21 ADS Med, , , ,     pneumococcal conjugate (13-valent) (PREVNAR-13) 0.5 mL vaccine, , , ,     pneumococcal conjugate (13-valent) (PREVNAR-13) 0.5 mL vaccine, , , ,     varicella-zoster gE-AS01B (PF) (SHINGRIX) 50 mcg/0.5 mL injection, , , ,     Medical History:  Past Medical History:   Diagnosis Date    Benign prostatic hyperplasia with lower urinary tract symptoms 11/27/2018    BPH (benign prostatic hyperplasia)     Diarrhea     Fatigue     Fractures     left arm    Glucose intolerance     May 2019: HbA1c 6.2.    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  Planning to build more apartments. Family History:  Family History   Problem Relation Age of Onset    Dementia Father     No Known Problems Other        Objective:   BP 152/65  - Pulse 76  - Temp 36.6 ??C (97.8 ??F) (Temporal)  - Resp 18  - Ht 200.7 cm (6' 7)  - Wt (!) 122.9 kg (270 lb 15.1 oz)  - SpO2 100%  - BMI 30.52 kg/m??     Physical Exam:    General: Resting in no apparent distress, sitting upright, comfortable, no acute distress  HEENT:  PER. No scleral icterus or conjunctival injection.  Oral mucosa without ulceration, erythema, exudate or purpura.  Nares show no bleeding.  Lymph node exam:  No lymphadenopathy in the anterior/posterior cervical, supraclavicular, axillary basins.  Heart:  Regular rate and rhythm. S1, S2. No murmurs, gallops or rubs.  Lungs:  Breathing is unlabored and patient is speaking full sentences with ease.  No stridor.  Auscultation of lung fields reveals normal air movement without rales, rhonchi or crackles.  GI:  No distention or pain on palpation. No palpable hepatomegaly or splenomegaly.  No palpable masses.  Skin:  No rashes, petechiae or purpura.  No areas of skin breakdown.  Musculoskeletal:  No grossly-evident joint effusions or deformities.  Range of motion about the shoulder, elbow, hips and knees is grossly normal.    Psychiatric:  Alert and oriented to person, place, time and situation.  Range of affect is appropriate.    Neurologic:  Grossly normal motor and sensory.   Extremities:  Appear well-perfused. No clubbing, edema or cyanosis.    Test Results:  Recent Results (from the past 24 hour(s))   Comprehensive Metabolic Panel    Collection Time: 11/09/22 10:45 AM   Result Value Ref Range    Sodium 140 135 - 145 mmol/L    Potassium 3.5 3.4 - 4.8 mmol/L    Chloride 108 (H) 98 - 107 mmol/L    CO2 25.0 20.0 - 31.0 mmol/L    Anion Gap 7 5 - 14 mmol/L    BUN 20 9 - 23 mg/dL    Creatinine 6.04 5.40 - 1.18 mg/dL    BUN/Creatinine Ratio 21     eGFR CKD-EPI (2021) Male 85 >=60 mL/min/1.3m2    Glucose 122 70 - 179 mg/dL    Calcium 8.2 (L) 8.7 - 10.4 mg/dL    Albumin 3.8 3.4 - 5.0 g/dL    Total Protein 6.7 5.7 - 8.2 g/dL    Total Bilirubin 1.0 0.3 - 1.2 mg/dL    AST 19 <=98 U/L    ALT 17 10 - 49 U/L    Alkaline Phosphatase 114 46 - 116 U/L   Lactate dehydrogenase    Collection Time: 11/09/22 10:45 AM   Result Value Ref Range    LDH 202 120 - 246 U/L   Uric acid    Collection Time: 11/09/22 10:45 AM   Result Value Ref Range    Uric Acid 2.8 (L) 3.7 - 9.2 mg/dL   Magnesium Level    Collection Time: 11/09/22 10:45 AM   Result Value Ref Range    Magnesium 1.9 1.6 - 2.6 mg/dL   CBC w/ Differential    Collection Time: 11/09/22 10:45 AM   Result Value Ref Range    WBC 1.9 (L) 3.6 - 11.2 10*9/L    RBC 2.41 (L) 4.26 - 5.60 10*12/L    HGB 8.0 (L) 12.9 - 16.5 g/dL    HCT 11.9 (L) 14.7 - 48.0 %    MCV 93.5 77.6 - 95.7 fL    MCH 33.1 (H) 25.9 - 32.4 pg    MCHC 35.4 32.0 - 36.0 g/dL    RDW 82.9 (H) 56.2 - 15.2 %    MPV 8.1 6.8 - 10.7 fL    Platelet 15 (L) 150 - 450 10*9/L    Neutrophils % 28.0 %    Lymphocytes % 52.3 %    Monocytes % 19.4 %    Eosinophils % 0.0 %    Basophils % 0.3 %    Absolute Neutrophils 0.5 (L) 1.8 - 7.8 10*9/L    Absolute Lymphocytes 1.0 (L) 1.1 - 3.6 10*9/L    Absolute Monocytes 0.4 0.3 - 0.8 10*9/L    Absolute Eosinophils 0.0 0.0 - 0.5 10*9/L    Absolute Basophils 0.0 0.0 - 0.1 10*9/L    Anisocytosis Slight (A) Not Present

## 2022-11-10 ENCOUNTER — Ambulatory Visit: Admit: 2022-11-10 | Discharge: 2022-11-10 | Payer: MEDICARE

## 2022-11-10 ENCOUNTER — Institutional Professional Consult (permissible substitution): Admit: 2022-11-10 | Discharge: 2022-11-10 | Payer: MEDICARE

## 2022-11-10 DIAGNOSIS — C9200 Acute myeloblastic leukemia, not having achieved remission: Principal | ICD-10-CM

## 2022-11-10 MED ORDER — ALLOPURINOL 300 MG TABLET
ORAL_TABLET | Freq: Every day | ORAL | 2 refills | 30 days | Status: CP
Start: 2022-11-10 — End: 2023-11-10

## 2022-11-13 DIAGNOSIS — C9200 Acute myeloblastic leukemia, not having achieved remission: Principal | ICD-10-CM

## 2022-11-14 ENCOUNTER — Ambulatory Visit: Admit: 2022-11-14 | Discharge: 2022-11-14 | Payer: MEDICARE

## 2022-11-14 ENCOUNTER — Institutional Professional Consult (permissible substitution): Admit: 2022-11-14 | Discharge: 2022-11-14 | Payer: MEDICARE

## 2022-11-14 DIAGNOSIS — C9000 Multiple myeloma not having achieved remission: Principal | ICD-10-CM

## 2022-11-14 DIAGNOSIS — C9001 Multiple myeloma in remission: Principal | ICD-10-CM

## 2022-11-14 DIAGNOSIS — C9200 Acute myeloblastic leukemia, not having achieved remission: Principal | ICD-10-CM

## 2022-11-14 LAB — PLATELET COUNT: PLATELET COUNT: 28 10*9/L — ABNORMAL LOW (ref 150–450)

## 2022-11-14 LAB — CBC W/ AUTO DIFF
BASOPHILS ABSOLUTE COUNT: 0 10*9/L (ref 0.0–0.1)
BASOPHILS RELATIVE PERCENT: 1.5 %
EOSINOPHILS ABSOLUTE COUNT: 0 10*9/L (ref 0.0–0.5)
EOSINOPHILS RELATIVE PERCENT: 0 %
HEMATOCRIT: 23.2 % — ABNORMAL LOW (ref 39.0–48.0)
HEMOGLOBIN: 8 g/dL — ABNORMAL LOW (ref 12.9–16.5)
LYMPHOCYTES ABSOLUTE COUNT: 0.9 10*9/L — ABNORMAL LOW (ref 1.1–3.6)
LYMPHOCYTES RELATIVE PERCENT: 34.6 %
MEAN CORPUSCULAR HEMOGLOBIN CONC: 34.4 g/dL (ref 32.0–36.0)
MEAN CORPUSCULAR HEMOGLOBIN: 32.5 pg — ABNORMAL HIGH (ref 25.9–32.4)
MEAN CORPUSCULAR VOLUME: 94.5 fL (ref 77.6–95.7)
MEAN PLATELET VOLUME: 8.1 fL (ref 6.8–10.7)
MONOCYTES ABSOLUTE COUNT: 0.7 10*9/L (ref 0.3–0.8)
MONOCYTES RELATIVE PERCENT: 26.8 %
NEUTROPHILS ABSOLUTE COUNT: 0.9 10*9/L — ABNORMAL LOW (ref 1.8–7.8)
NEUTROPHILS RELATIVE PERCENT: 37.1 %
NUCLEATED RED BLOOD CELLS: 0 /100{WBCs} (ref <=4)
PLATELET COUNT: 18 10*9/L — ABNORMAL LOW (ref 150–450)
RED BLOOD CELL COUNT: 2.46 10*12/L — ABNORMAL LOW (ref 4.26–5.60)
RED CELL DISTRIBUTION WIDTH: 18.2 % — ABNORMAL HIGH (ref 12.2–15.2)
WBC ADJUSTED: 2.5 10*9/L — ABNORMAL LOW (ref 3.6–11.2)

## 2022-11-14 NOTE — Unmapped (Signed)
[facility] Specialty Medication Onboarding    Specialty Medication: Venclexta  Prior Authorization: Approved   Financial Assistance: Yes - grant approved as secondary   Final Copay/Day Supply: $0 / 30    Insurance Restrictions: None     Notes to Pharmacist:     The triage team has completed the benefits investigation and has determined that the patient is able to fill this medication at [facility]. Please contact the patient to complete the onboarding or follow up with the prescribing physician as needed.

## 2022-11-14 NOTE — Unmapped (Signed)
Patient arrived in the infusion clinic at 0900.  Weight and vitals were obtained. PIV was placed during clinic nurse visit. RN flushed with NS, brisk blood return noted. Dressing clean, dry, and intact.  Labs were drawn during clinic nurse visit Hgb 8.0. Platelet count 18. No PRBC's per transfusion plan. 1 unit of platelets today per [name] - patient with BMB tomorrow. Patient may discharge home following post platelet count. Given per orders and post platelet count drawn from PIV 30 min after end of transfusion and sent to lab.  PIV with brisk blood return following infusion. Removed intact and dressed with gauze and Coban. Patient declined after visit summary then discharged home to self care.

## 2022-11-14 NOTE — Unmapped (Signed)
Infusion on 11/14/2022   Component Date Value Ref Range Status    Unit Blood Type 11/14/2022 A Pos   Final    ISBT Number 11/14/2022 6200   Final    Unit # 11/14/2022 [id number]   Final    Status 11/14/2022 Issued   Final    Product ID 11/14/2022 Platelets   Final    PRODUCT CODE 11/14/2022 [id number]   Final    Platelet 11/14/2022 28 (L)  150 - 450 10*9/L Final   Clinical Support on 11/14/2022   Component Date Value Ref Range Status    Blood Type 11/14/2022 A POS   Final    Antibody Screen 11/14/2022 NEG   Final    WBC 11/14/2022 2.5 (L)  3.6 - 11.2 10*9/L Final    RBC 11/14/2022 2.46 (L)  4.26 - 5.60 10*12/L Final    HGB 11/14/2022 8.0 (L)  12.9 - 16.5 g/dL Final    HCT 82/95/6213 23.2 (L)  39.0 - 48.0 % Final    MCV 11/14/2022 94.5  77.6 - 95.7 fL Final    MCH 11/14/2022 32.5 (H)  25.9 - 32.4 pg Final    MCHC 11/14/2022 34.4  32.0 - 36.0 g/dL Final    RDW 08/65/7846 18.2 (H)  12.2 - 15.2 % Final    MPV 11/14/2022 8.1  6.8 - 10.7 fL Final    Platelet 11/14/2022 18 (L)  150 - 450 10*9/L Final    nRBC 11/14/2022 0  <=4 /100 WBCs Final    Neutrophils % 11/14/2022 37.1  % Final    Lymphocytes % 11/14/2022 34.6  % Final    Monocytes % 11/14/2022 26.8  % Final    Eosinophils % 11/14/2022 0.0  % Final    Basophils % 11/14/2022 1.5  % Final    Absolute Neutrophils 11/14/2022 0.9 (L)  1.8 - 7.8 10*9/L Final    Absolute Lymphocytes 11/14/2022 0.9 (L)  1.1 - 3.6 10*9/L Final    Absolute Monocytes 11/14/2022 0.7  0.3 - 0.8 10*9/L Final    Absolute Eosinophils 11/14/2022 0.0  0.0 - 0.5 10*9/L Final    Absolute Basophils 11/14/2022 0.0  0.0 - 0.1 10*9/L Final    Anisocytosis 11/14/2022 Slight (A)  Not Present Final

## 2022-11-14 NOTE — Unmapped (Signed)
[facility] Return Visit     REASON FOR VISIT: routine follow up. This visit included intensive monitoring of high-risk medications, namely MM chemotherapy, given risk of severe myelosuppression, infection, and other toxicities.  Review included CBC, electrolytes and other blood tests, as well as direct assessment of the patient for symptoms suggesting toxicity.     Multiple myeloma, kappa free light chain  Stage: ISS 1, R-ISS 1, R2-ISS group 1  Risk stratification: standard risk (R-ISS 1, no high-risk CA, low-risk GEP)  Prognosis: based on R2-ISS group 1, median OS not reached and PFS 5.7 years (D'Agostino et al., JCO 2022); based on R-ISS 1, estimated 5-year OS and PFS of 82% and 55% (Palumbo JCO 2015)  Presentation: R hip pain, lytic lesion in R femur  Treatment history: RVD x4 (02/19/19-) - ASCT, mel 140 mg/m2 (05/29/19; VGPR) - Len maintenance, [id number] (10/22/19-08/2022; CR, MRD-neg)   Current Chemotherapy: none, observation off therapy in setting of recent AML diagnosis  Antiresorptive therapy: zoledronic acid 01/2019-04/2022    AML, 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 Plan for treatment with aza/ven on BEAT AML trial. Continues twice weekly labs/transfusions.    ASSESSMENT: 76 y.o. 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 No signs/symptoms to suggest progression of multiple myeloma and we will plan to defer any further multiple myeloma therapy for now. The patient has established care with [name] with plan to start aza/ven on BEAT AML trial. If there is future evidence of multiple myeloma progression requiring treatment, would at that time consider adding daratumumab monotherapy to AML-directed therapy.    PLAN:  AML-directed therapy per leukemia team  Continue transfusion support per leukemia team  Defer multiple myeloma-directed therapy at this time; permanently discontinue lenalidomide     I personally spent 30 minutes face-to-face and non-face-to-face in the care of this patient, which includes all pre, intra, and post visit time on the date of service     [name], MD  Assistant Professor of Medicine  Division of Hematology, Myeloma Program    Nurse Navigator: Desmond Dike, RN  Questions and appointments M-F 8am - 5pm: [phone] or [phone]          INTERIM HISTORY:   The patient presents today for routine follow up.  Established with [name]. Feels ok overall. Reports stressful situation at home related to ongoing bathroom and deck renovations in the context of recent AML diagnosis. Wife is now back from [place].   Asks about appointments with leukemia team beyond next week and timeline for starting AML-directed therapy.      HEMATOLOGICAL / ONCOLOGICAL HISTORY:    1. Kappa free light chain multiple myeloma (MM), ISS 1, R-ISS 1, MyPRS low risk, no high-risk CA  A. Presentation: lytic lesion of R femur  B. Initial workup:  SPEP irregularity and decrease in the gamma region (no IFE) (01/03/19)  Serum free kappa 125.2 mg/dL, lambda <0.86 mg/dL, ratio >578 (4/69/62)  WBC 9.8, Hgb 14.3, Plts 327  Cr 1.05, Ca 9.4  Beta-2-microglobulin 2.58  Albumin 4.3  LDH 395 (normal)  PET/CT: diffuse moderate FDG uptake throughout axial and proximal appendicular skeleton with innumerable lytic osseous lesions. Nonspecific focus of FDG avidity surrounding R hip prosthesis.   MRI RLE: abnormal marrow signal, 3.3 cm T2 hyperintense  enhancing oval lesion with endosteal scalloping at tip of femoral stem, without cortical breakthrough.  Bone marrow: 70% cellularity; 40-80% plasmacytosis; cytogenetics: normal; FISH monosomy 13, CCND1/IGH fusion [t(11;14]; MyPRS score 35, low risk  C. XRT (02/13/19): 8Gy in single fraction to symptomatic right femur lesions adjacent to hip implant  D. RVD (C1 = 02/19/19; C2 = 03/12/19; C3 = 04/02/19; C4 = 04/23/19): starting kappa FLC 151 mg/dL; best response = VGPR    BM biopsy 05/13/19: 70% cellularity, 10-20% plasmacytosis  E. ASCT, melphalan 140 mg/m2 (05/29/19)   07/09/2019: kappa FLC 8.58 mg/dL   16/08/9603: serum free kappa 5.46 mg/dL, lambda 5.40 mg/dL, ratio 9.81; SPEP/IFE negative; 24h UPEP kappa FLC TLTQ  F. Lenalidomide on [id number] (10/22/19)    12/16/2019: SPEP/IFE negative; serum free kappa 3.02 mg/dL, lambda 9.56 mg/dL, ratio  2.13    08/65/7846: SPEP/IFE negative; serum free kappa 2.58 mg/dL, lambda 9.62 mg/dL, ratio 9.52    84/13/2440: SPEP/IFE negative; serum free kappa 2.72, lambda 1.68, ratio 1.62   09/20/2020: SPEP/IFE negative serum free kappa 3.01, lambda 2.09, ratio 1.44   11/16/2020: SPEP/IFE negative; serum free kappa 2.72, lambda 2.01, FLC ratio 1.35   02/08/2021: SPEP/IFE negative; serum free kappa 2.42, lambda 1.76, FLC ratio 1.38   05/03/2021: SPEP/IFE negative; serum free kappa 2.37 mg/dL, lambda 1.02 mg/dL, ratio 7.25    36/64/4034: SPEP/IFE negative; serum free kappa 3.59 mg/dL, lambda 7.42 mg/dL, ratio 5.95    63/87/5643: BM biopsy -- slightly hypocellular, no morphologic/immunophenotypic evidence of plasma cell neoplasm      08/22/2022: PET/CT -- negative; 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    ALLERGIES: Patient has no known allergies.    MEDICATIONS:    Current Outpatient Medications:     allopurinol (ZYLOPRIM) 300 MG tablet, Take 1 tablet (300 mg total) by mouth daily., Disp: 30 tablet, Rfl: 2    calcium carbonate (TUMS) 200 mg calcium (500 mg) chewable tablet, Chew 2 tablets (400 mg of elem calcium total) Three (3) times a day., Disp: , Rfl:     cholecalciferol, vitamin D3 25 mcg, 1,000 units,, 1,000 unit (25 mcg) tablet, Take 1 tablet (25 mcg total) by mouth., Disp: , Rfl:     cholestyramine (QUESTRAN) 4 gram packet, Take 1 packet by mouth in the morning. Mix 4 g (1 packet) into 2-3 ounces of fluid and take twice a day.., Disp: 60 each, Rfl: 2    cholestyramine (QUESTRAN) 4 gram packet, TAKE 1 PACKET BY MOUTH EVERY MORNING. MIX 4 GRAMS INTO 2 TO 3 OZ OF FLUID AND TAKE TWICE DAILY, Disp: 60 each, Rfl: 2    cyanocobalamin, vitamin B-12, 2,000 mcg Tab, Take 2,000 mcg by mouth in the morning., Disp: 90 tablet, Rfl: 3    ferrous sulfate 325 (65 FE) MG tablet, Take 1 tablet (325 mg total) by mouth daily., Disp: 30 tablet, Rfl: 3    fluticasone propionate (FLONASE) 50 mcg/actuation nasal spray, 1 spray into each nostril daily as needed., Disp: , Rfl:     loratadine (CLARITIN) 10 mg tablet, Take 1 tablet (10 mg total) by mouth daily., Disp: , Rfl:     metoprolol succinate (TOPROL-XL) 50 MG 24 hr tablet, Take 1 tablet (50 mg total) by mouth., Disp: , Rfl:     multivitamin with minerals tablet, Take 1 tablet by mouth daily., Disp: , Rfl:     omeprazole (PRILOSEC) 40 MG capsule, Take 1 capsule (40 mg total) by mouth daily., Disp: 90 capsule, Rfl: 0  sildenafiL, pulm.hypertension, (REVATIO) 20 mg tablet, Take 1-5 tablets (20-100 mg total) by mouth daily as needed (for erectile function)., Disp: 90 tablet, Rfl: 3    tamsulosin (FLOMAX) 0.4 mg capsule, Take 1 capsule (0.4 mg total) by mouth daily., Disp: 90 capsule, Rfl: 0    traZODone (DESYREL) 50 MG tablet, , Disp: , Rfl:     valACYclovir (VALTREX) 500 MG tablet, Take 1 tablet (500 mg total) by mouth daily for 90 doses., Disp: 90 tablet, Rfl: 0    venetoclax (VENCLEXTA) 100 mg tablet, Take 4 tablets (400 mg total) by mouth daily. Take with a meal and water. Do not chew, crush, or break tablets., Disp: 120 tablet, Rfl: 1  No current facility-administered medications for this visit.    Facility-Administered Medications Ordered in Other Visits:     DTaP-hepatitis B recombinant-IPV (PEDIARIX) 10 mcg-25Lf-25 mcg-10Lf/0.5 mL injection, , , ,     DTaP-hepatitis B recombinant-IPV (PEDIARIX) 10 mcg-25Lf-25 mcg-10Lf/0.5 mL injection, , , ,     haemophilus B polysac-tetanus toxoid (ActHIB) 10 mcg/0.5 mL injection, , , ,     influenza vaccine quad (FLUARIX, FLULAVAL, FLUZONE) (6 MOS & UP) 2020-21 ADS Med, , , ,     pneumococcal conjugate (13-valent) (PREVNAR-13) 0.5 mL vaccine, , , ,     pneumococcal conjugate (13-valent) (PREVNAR-13) 0.5 mL vaccine, , , ,     sodium chloride (NS) 0.9 % infusion, 50 mL/hr, Intravenous, Continuous, Gichuhi, Judy W, AGNP    varicella-zoster gE-AS01B (PF) (SHINGRIX) 50 mcg/0.5 mL injection, , , ,       SOCIAL HISTORY: No tobacco, alcohol or drug use. 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 Walking for routine exercise.    FAMILY HISTORY: No blood disorders or cancer, sudden cardiac death or unexplained cardiac disease    VITAL SIGNS: BP 170/84  - Pulse 86  - Temp 36.4 ??C (97.5 ??F) (Temporal)  - Resp 16  - Wt (!) 122.7 kg (270 lb 8 oz)  - SpO2 100%  - BMI 30.47 kg/m??     PHYSICAL EXAM:  GENERAL: Patient appears well and in no obvious distress.  HEENT: EOMI, no scleral icterus.  CV: Regular rate and rhythm with no murmurs. No edema.  PULM: Clear to auscultation bilaterally.  GI: Abdomen nondistended  MUSCSK: No palpable abnormalities. No spinal/paraspinal tenderness.  NEURO: A&Ox4  SKIN: no rashes or lesions    LABS:  Lab Results   Component Value Date    WBC 2.5 (L) 11/14/2022    HGB 8.0 (L) 11/14/2022    HCT 23.2 (L) 11/14/2022    PLT 28 (L) 11/14/2022    NEUTROABS 0.9 (L) 11/14/2022    LYMPHSABS 0.9 (L) 11/14/2022    MCV 94.5 11/14/2022    NA 140 11/09/2022    K 3.5 11/09/2022    CL 108 (H) 11/09/2022    CO2 25.0 11/09/2022    BUN 20 11/09/2022    CREATININE 0.94 11/09/2022    GLU 122 11/09/2022    CALCIUM 8.2 (L) 11/09/2022    MG 1.9 11/09/2022    PHOS 3.5 07/07/2019    URICACID 2.8 (L) 11/09/2022    BILITOT 1.0 11/09/2022    BILIDIR <0.10 07/03/2019    PROT 6.7 11/09/2022    ALBUMIN 3.8 11/09/2022    ALT 17 11/09/2022    AST 19 11/09/2022    ALKPHOS 114 11/09/2022    GGT 64 06/28/2019    INR 0.97 10/22/2019    APTT  27.7 10/22/2019    B2MG S 2.32 08/24/2020    LDH 202 11/09/2022    IGG 1,142 10/31/2022    IGM 20 (L) 10/31/2022    IGA 66.1 (L) 10/31/2022    SPEINTERP  10/31/2022      Comment:      The SPE pattern demonstrates a slight irregularity in the gamma region, which may represent monoclonal protein. See Immunofixation report.      IFE  10/31/2022      Comment:      No monoclonal protein detected.      KAPFS 2.20 (H) 10/31/2022    LAMFS 1.18 10/31/2022    RATKL 1.86 (H) 10/31/2022    IFEUR  05/13/2019      Comment:      Monoclonal component typed as free Kappa light chains.               Lab Results   Component Value Date    HEPBSAB Nonreactive 09/03/2019    HEPBCAB Nonreactive 09/03/2019    HEPCAB Nonreactive 10/24/2022

## 2022-11-15 ENCOUNTER — Ambulatory Visit: Admit: 2022-11-15 | Discharge: 2022-11-16 | Payer: MEDICARE

## 2022-11-15 ENCOUNTER — Encounter: Admit: 2022-11-15 | Discharge: 2022-11-16 | Payer: MEDICARE | Attending: Adult Health | Primary: Adult Health

## 2022-11-15 ENCOUNTER — Other Ambulatory Visit: Admit: 2022-11-15 | Discharge: 2022-11-16 | Payer: MEDICARE

## 2022-11-15 ENCOUNTER — Encounter
Admit: 2022-11-15 | Discharge: 2022-11-16 | Payer: MEDICARE | Attending: Nurse Practitioner | Primary: Nurse Practitioner

## 2022-11-15 DIAGNOSIS — C9200 Acute myeloblastic leukemia, not having achieved remission: Principal | ICD-10-CM

## 2022-11-15 DIAGNOSIS — C9000 Multiple myeloma not having achieved remission: Principal | ICD-10-CM

## 2022-11-15 LAB — CBC W/ AUTO DIFF
BASOPHILS ABSOLUTE COUNT: 0 10*9/L (ref 0.0–0.1)
BASOPHILS RELATIVE PERCENT: 0.5 %
EOSINOPHILS ABSOLUTE COUNT: 0 10*9/L (ref 0.0–0.5)
EOSINOPHILS RELATIVE PERCENT: 0 %
HEMATOCRIT: 21.2 % — ABNORMAL LOW (ref 39.0–48.0)
HEMOGLOBIN: 7.4 g/dL — ABNORMAL LOW (ref 12.9–16.5)
LYMPHOCYTES ABSOLUTE COUNT: 1 10*9/L — ABNORMAL LOW (ref 1.1–3.6)
LYMPHOCYTES RELATIVE PERCENT: 47.8 %
MEAN CORPUSCULAR HEMOGLOBIN CONC: 35.1 g/dL (ref 32.0–36.0)
MEAN CORPUSCULAR HEMOGLOBIN: 32.6 pg — ABNORMAL HIGH (ref 25.9–32.4)
MEAN CORPUSCULAR VOLUME: 93 fL (ref 77.6–95.7)
MEAN PLATELET VOLUME: 8.2 fL (ref 6.8–10.7)
MONOCYTES ABSOLUTE COUNT: 0.4 10*9/L (ref 0.3–0.8)
MONOCYTES RELATIVE PERCENT: 20.9 %
NEUTROPHILS ABSOLUTE COUNT: 0.6 10*9/L — ABNORMAL LOW (ref 1.8–7.8)
NEUTROPHILS RELATIVE PERCENT: 30.8 %
PLATELET COUNT: 19 10*9/L — ABNORMAL LOW (ref 150–450)
RED BLOOD CELL COUNT: 2.28 10*12/L — ABNORMAL LOW (ref 4.26–5.60)
RED CELL DISTRIBUTION WIDTH: 18.8 % — ABNORMAL HIGH (ref 12.2–15.2)
WBC ADJUSTED: 2 10*9/L — ABNORMAL LOW (ref 3.6–11.2)

## 2022-11-15 LAB — PLATELET COUNT: PLATELET COUNT: 29 10*9/L — ABNORMAL LOW (ref 150–450)

## 2022-11-15 LAB — SMEAR - BONE MARROW PATIENT

## 2022-11-15 NOTE — Unmapped (Signed)
Please leave dressing in place and keep it dry for 24 hrs before removing. You can resume normal activities tomorrow, but take things easy today.     You may take tylenol as needed for discomfort at the area where the biopsy was taken.   After receiving Ativan, please do not drive today.     If you have questions between 8am to 5 pm Monday through Friday please call [phone] and speak to the operator.      For emergencies, evenings or weekends, please call [phone] and ask for oncology fellow on call.     Reasons to call emergency line may include:   Fever of 100.5 or greater   Nausea and/or vomiting not relieved with nausea medicine   Diarrhea or constipation   Severe pain not relieved with usual pain regimen       Hospital Outpatient Visit on 11/15/2022   Component Date Value Ref Range Status    Unit Blood Type 11/15/2022 A Pos   Final    ISBT Number 11/15/2022 [id number]   Final    Unit # 11/15/2022 [id number]   Final    Status 11/15/2022 Ready   Final    Product ID 11/15/2022 Platelets   Final    PRODUCT CODE 11/15/2022 [id number]   Final   Lab on 11/15/2022   Component Date Value Ref Range Status    WBC 11/15/2022 2.0 (L)  3.6 - 11.2 10*9/L Final    RBC 11/15/2022 2.28 (L)  4.26 - 5.60 10*12/L Final    HGB 11/15/2022 7.4 (L)  12.9 - 16.5 g/dL Final    HCT 93/23/5573 21.2 (L)  39.0 - 48.0 % Final    MCV 11/15/2022 93.0  77.6 - 95.7 fL Final    MCH 11/15/2022 32.6 (H)  25.9 - 32.4 pg Final    MCHC 11/15/2022 35.1  32.0 - 36.0 g/dL Final    RDW 22/12/5425 18.8 (H)  12.2 - 15.2 % Final    MPV 11/15/2022 8.2  6.8 - 10.7 fL Final    Platelet 11/15/2022 19 (L)  150 - 450 10*9/L Final    Neutrophils % 11/15/2022 30.8  % Final    Lymphocytes % 11/15/2022 47.8  % Final    Monocytes % 11/15/2022 20.9  % Final    Eosinophils % 11/15/2022 0.0  % Final    Basophils % 11/15/2022 0.5  % Final    Absolute Neutrophils 11/15/2022 0.6 (L)  1.8 - 7.8 10*9/L Final    Absolute Lymphocytes 11/15/2022 1.0 (L)  1.1 - 3.6 10*9/L Final Absolute Monocytes 11/15/2022 0.4  0.3 - 0.8 10*9/L Final    Absolute Eosinophils 11/15/2022 0.0  0.0 - 0.5 10*9/L Final    Absolute Basophils 11/15/2022 0.0  0.0 - 0.1 10*9/L Final    Anisocytosis 11/15/2022 Slight (A)  Not Present Final

## 2022-11-15 NOTE — Unmapped (Signed)
[facility]   Patient Onboarding/Medication Counseling    Timothy Porter is a 76 y.o. 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  I am speaking to the patient.    Was a Nurse, learning disability used for this call? No    Verified patient's date of birth / HIPAA.    Specialty medication(s) to be sent: Hematology/Oncology: Venclexta    Non-specialty medications/supplies to be sent: none    Medications not needed at this time: none     Venclexta (Venetoclax)    The patient declined counseling on medication administration, missed dose instructions, goals of therapy, side effects and monitoring parameters, warnings and precautions, drug/food interactions, and storage, handling precautions, and disposal because  He will be counseled in clinic at start of therapy . The information in the declined sections below are for informational purposes only and was not discussed with patient.     Medication & Administration     Dosage: AML: Day 1: Take 100 mg by mouth once daily. Day 2: Take 200 mg by mouth once daily. Day 3 and thereafter: Take 400 mg by mouth once daily. Directions on prescription: Take 4 tablets (400 mg total) by mouth daily. Take with a meal and water. Do not chew, crush, or break tablets.    Administration:   Take with a meal.  Swallow whole with a glass of water. Do not break, crush or chew.    Drink plenty of water when taking this drug unless told to drink less water by your doctor. Drink 6 to 8 glasses (about 56 ounces total) of water each day. Start doing this 2 days before your first dose.   Do not stop taking unless instructed to do so by your doctor.    If you throw up after taking a dose, do not repeat the dose. Take your next dose at your normal time.  Adherence/Missed dose instructions: Take a missed dose as soon as you think about.  If it has been more than 8 hours since the missed dose, skip the missed dose and go back to your normal time. Do not take 2 doses at the same time or extra doses.  Goals of Therapy     To prevent disease progression    Side Effects & Monitoring Parameters     Commonly reported side effects  Abdominal pain   Common cold symptoms  Fatigue, loss of strength and energy  Constipation  Muscle pain, joint pain, back pain  Mouth irritation, mouth sores  Restlessness, difficulty seeping    The following side effects should be reported to the provider:  Signs of infection (fever >100.4, chills, mouth sores, sputum production)  Signs of bleeding (vomiting or coughing up blood, blood that looks like coffee grounds, blood in the urine or black, red tarry stools, bruising that gets bigger without reason, any persistent or severe bleeding, impaired wound healing)  Signs of low blood sugar (dizziness, headache, fatigue, feeling weak, shaking, fast heartbeat, confusion, increased hunger or sweating)  Signs of high blood sugar (confusion, fatigue increased thirst, increased hunger, passing a lot of urine, flushing, fast breathing or breath that smells like fruit)  Signs of fluid and electrolyte problems (mood changes, confusion, muscle pain or weakness, abnormal/fast heartbeat, severe dizziness, passing out)  Signs of tumor lysis syndrome (fast heartbeat or abnormal heartbeat; any passing out; unable to pass urine; muscle weakness or cramps; nausea, vomiting, diarrhea or lack of appetite; or feeling sluggish)  Severe headache,  dizziness, passing out  Vision changes  Severe loss of energy and strength  Dark urine, urine discoloration, yellow eyes or skin  Severe nausea, vomiting, inability to eat  Severe diarrhea   Shortness of breath  Swelling  Bruising  Pale skin  Signs of anaphylaxis (wheezing, chest tightness, swelling of face, lips, tongue or throat)    Monitoring Parameters:   CBC with differential (throughout treatment)  Blood chemistries (potassium, uric acid, phosphorus, calcium, and creatinine)  Pregnancy test (prior to treatment in females of reproductive potential)  Assess tumor burden, including radiographic evaluation (eg, CT scan) for tumor lysis syndrome (TLS) risk evaluation;   Monitor for signs/symptoms of infection.   Monitor adherence.    Contraindications, Warnings, & Precautions   Contraindications:  Concomitant use with strong CYP3A inhibitors at initiation and during ramp-up phase in patients with chronic lymphocytic leukemia/small lymphocytic lymphoma due to the potential for increased risk of tumor lysis syndrome.   Warnings & Precautions:   Bone marrow suppression: Neutropenia, thrombocytopenia, and anemia may occur. Neutropenic fever has been reported both with monotherapy and combination therapy.   Infection: Serious and fatal infections, including pneumonia and sepsis, have occurred.   Tumor lysis syndrome: Tumor lysis syndrome including fatalities and renal failure requiring dialysis has occurred in patients with high tumor burden when treated with venetoclax.   Hepatic impairment: Dosage adjustment recommended in patients with severe hepatic impairment. Monitor closely for toxicities.  Multiple myeloma: In patients with relapsed or refractory multiple myeloma, an increase in mortality was noted when venetoclax was added to bortezomib and dexamethasone. Venetoclax is not approved for the treatment of multiple myeloma.  Renal impairment: Patients with decreased renal function (CrCl <80 mL/minute) are at increased risk for TLS and may require more intensive TLS prophylaxis and monitoring during treatment initiation and dose escalation.  Immunizations: Live vaccinations should not be administered prior to, during, or after venetoclax treatment until B-cell recovery occurs. Vaccines may be less effective.  Reproductive Considerations: Females of reproductive potential should have a pregnancy test prior to therapy and use effective contraception during treatment and for at least 30 days after the final dose.  Based on the mechanism of action and data from animal reproduction studies, venetoclax is expected to cause fetal harm if administered during pregnancy.  Breastfeeding considerations: It is not known if venetoclax is present in breast milk. Due to the potential for serious adverse reactions in the breastfed infant, breastfeeding is not recommended by the manufacturer.  Drug/Food Interactions     Medication list reviewed in Epic. The patient was instructed to inform the care team before taking any new medications or supplements. No drug interactions identified.   Avoid live vaccines. (venetoclax may diminish the therapeutic effects of live vaccines and enhance the toxic/adverse effects of live vaccines.  Avoid grapefruit and grapefruit juice. (may increase serum concentrations of venetoclax)  Avoid Seville oranges and star fruit. (May increase serum concentrations of venetoclax)    Storage, Handling Precautions, & Disposal   Store at room temperature in the original container (do not use a pillbox or store with other medications).   Caregivers helping administer medication should wear gloves and wash hands immediately after.    Keep the lid tightly closed. Keep out of the reach of children and pets.  Do not flush down a toilet or pour down a drain unless instructed to do so.  Check with your local police department or fire station about drug take-back programs in your area.  Current Medications (including OTC/herbals), Comorbidities and Allergies     Current Outpatient Medications   Medication Sig Dispense Refill    allopurinol (ZYLOPRIM) 300 MG tablet Take 1 tablet (300 mg total) by mouth daily. 30 tablet 2    calcium carbonate (TUMS) 200 mg calcium (500 mg) chewable tablet Chew 2 tablets (400 mg of elem calcium total) Three (3) times a day.      cholecalciferol, vitamin D3 25 mcg, 1,000 units,, 1,000 unit (25 mcg) tablet Take 1 tablet (25 mcg total) by mouth.      cholestyramine (QUESTRAN) 4 gram packet Take 1 packet by mouth in the morning. Mix 4 g (1 packet) into 2-3 ounces of fluid and take twice a day.. 60 each 2    cholestyramine (QUESTRAN) 4 gram packet TAKE 1 PACKET BY MOUTH EVERY MORNING. MIX 4 GRAMS INTO 2 TO 3 OZ OF FLUID AND TAKE TWICE DAILY 60 each 2    cyanocobalamin, vitamin B-12, 2,000 mcg Tab Take 2,000 mcg by mouth in the morning. 90 tablet 3    ferrous sulfate 325 (65 FE) MG tablet Take 1 tablet (325 mg total) by mouth daily. 30 tablet 3    fluticasone propionate (FLONASE) 50 mcg/actuation nasal spray 1 spray into each nostril daily as needed.      loratadine (CLARITIN) 10 mg tablet Take 1 tablet (10 mg total) by mouth daily.      metoprolol succinate (TOPROL-XL) 50 MG 24 hr tablet Take 1 tablet (50 mg total) by mouth.      multivitamin with minerals tablet Take 1 tablet by mouth daily.      omeprazole (PRILOSEC) 40 MG capsule Take 1 capsule (40 mg total) by mouth daily. 90 capsule 0    sildenafiL, pulm.hypertension, (REVATIO) 20 mg tablet Take 1-5 tablets (20-100 mg total) by mouth daily as needed (for erectile function). 90 tablet 3    tamsulosin (FLOMAX) 0.4 mg capsule Take 1 capsule (0.4 mg total) by mouth daily. 90 capsule 0    traZODone (DESYREL) 50 MG tablet       valACYclovir (VALTREX) 500 MG tablet Take 1 tablet (500 mg total) by mouth daily for 90 doses. 90 tablet 0    venetoclax (VENCLEXTA) 100 mg tablet Take 4 tablets (400 mg total) by mouth daily. Take with a meal and water. Do not chew, crush, or break tablets. 120 tablet 1     No current facility-administered medications for this visit.     Facility-Administered Medications Ordered in Other Visits   Medication Dose Route Frequency Provider Last Rate Last Admin    DTaP-hepatitis B recombinant-IPV (PEDIARIX) 10 mcg-25Lf-25 mcg-10Lf/0.5 mL injection             DTaP-hepatitis B recombinant-IPV (PEDIARIX) 10 mcg-25Lf-25 mcg-10Lf/0.5 mL injection             haemophilus B polysac-tetanus toxoid (ActHIB) 10 mcg/0.5 mL injection             influenza vaccine quad (FLUARIX, FLULAVAL, FLUZONE) (6 MOS & UP) 2020-21 ADS Med             pneumococcal conjugate (13-valent) (PREVNAR-13) 0.5 mL vaccine             pneumococcal conjugate (13-valent) (PREVNAR-13) 0.5 mL vaccine             varicella-zoster gE-AS01B (PF) (SHINGRIX) 50 mcg/0.5 mL injection                No Known Allergies  Patient Active Problem List   Diagnosis    Hip pain, right    Benign prostatic hypertrophy with urinary frequency    Osteoarthritis of right hip    Status post right hip replacement    Elevated blood sugar    Left hip pain    Pre-diabetes    Adenomatous polyp    Multiple myeloma (CMS-HCC)    Autologous stem cell transplant (CMS-HCC)    History of auto stem cell transplant (CMS-HCC)    Immunization due    Chronic fatigue    Essential (primary) hypertension    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CMS-HCC)    Elevated ASCVD risk score     Healthcare maintenance    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)       Reviewed and up to date in Epic.    Appropriateness of Therapy     Acute infections noted within Epic:  No active infections  Patient reported infection: None    Is medication and dose appropriate based on diagnosis and infection status? Yes    Prescription has been clinically reviewed: Yes      Baseline Quality of Life Assessment      How many days over the past month did your AML  keep you from your normal activities? For example, brushing your teeth or getting up in the morning. 0    Financial Information     Medication Assistance provided: Prior Authorization and [employer]    Anticipated copay of $0 / 30 days reviewed with patient. Verified delivery address.    Delivery Information     Scheduled delivery date: 11/17/22    Expected start date: 11/22/22    Medication will be delivered via [facility] to the temporary address in [place].  This shipment will not require a signature.      Explained the services we provide at [facility] and that each month we would call to set up refills.  Stressed importance of returning phone calls so that we could ensure they receive their medications in time each month.  Informed patient that we should be setting up refills 7-10 days prior to when they will run out of medication.  A pharmacist will reach out to perform a clinical assessment periodically.  Informed patient that a welcome packet, containing information about our pharmacy and other support services, a Notice of Privacy Practices, and a drug information handout will be sent.      The patient or caregiver noted above participated in the development of this care plan and knows that they can request review of or adjustments to the care plan at any time.      Patient or caregiver verbalized understanding of the above information as well as how to contact the pharmacy at [phone] option 4 with any questions/concerns.  The pharmacy is open Monday through Friday 8:30am-4:30pm.  A pharmacist is available 24/7 via pager to answer any clinical questions they may have.    Patient Specific Needs     Does the patient have any physical, cognitive, or cultural barriers? No    Does the patient have adequate living arrangements? (i.e. the ability to store and take their medication appropriately) Yes    Did you identify any home environmental safety or security hazards? No    Patient prefers to have medications discussed with  Patient     Is the patient or caregiver able to read and understand education materials at a  high school level or above? Yes    Patient's primary language is  English     Is the patient high risk? Yes, patient is taking oral chemotherapy. Appropriateness of therapy as been assessed    SOCIAL DETERMINANTS OF HEALTH     At the [facility], we have learned that life circumstances - like trouble affording food, housing, utilities, or transportation can affect the health of many of our patients.   That is why we wanted to ask: are you currently experiencing any life circumstances that are negatively impacting your health and/or quality of life? No    Social Determinants of Health     Financial Resource Strain: Low Risk  (11/02/2022)    Overall Financial Resource Strain (CARDIA)     Difficulty of Paying Living Expenses: Not hard at all   Internet Connectivity: No Internet connectivity concern identified (11/02/2022)    Internet Connectivity     Do you have access to internet services: Yes     How do you connect to the internet: Personal Device at home     Is your internet connection strong enough for you to watch video on your device without major problems?: Yes     Do you have enough data to get through the month?: Yes     Does at least one of the devices have a camera that you can use for video chat?: Yes   Food Insecurity: No Food Insecurity (03/04/2021)    Hunger Vital Sign     Worried About Running Out of Food in the Last Year: Never true     Ran Out of Food in the Last Year: Never true   Tobacco Use: Low Risk  (11/14/2022)    Patient History     Smoking Tobacco Use: Never     Smokeless Tobacco Use: Never     Passive Exposure: Not on file   Housing/Utilities: Low Risk  (11/02/2022)    Housing/Utilities     Within the past 12 months, have you ever stayed: outside, in a car, in a tent, in an overnight shelter, or temporarily in someone else's home (i.e. couch-surfing)?: No     Are you worried about losing your housing?: No     Within the past 12 months, have you been unable to get utilities (heat, electricity) when it was really needed?: No   Alcohol Use: Not At Risk (10/17/2019)    Alcohol Use     How often do you have a drink containing alcohol?: Monthly or less     How many drinks containing alcohol do you have on a typical day when you are drinking?: 1 - 2     How often do you have 5 or more drinks on one occasion?: Never   Transportation Needs: No Transportation Needs (09/03/2020)    PRAPARE - Transportation     Lack of Transportation (Medical): No Lack of Transportation (Non-Medical): No   Substance Use: Not on file   Health Literacy: Low Risk  (03/04/2021)    Health Literacy     : Never   Physical Activity: Insufficiently Active (05/28/2019)    Exercise Vital Sign     Days of Exercise per Week: 7 days     Minutes of Exercise per Session: 20 min   Interpersonal Safety: Not on file   Stress: No Stress Concern Present (05/28/2019)    [employer] of Occupational Health - Occupational Stress Questionnaire     Feeling of Stress : Not at  all   Intimate Partner Violence: Not At Risk (03/04/2021)    Humiliation, Afraid, Rape, and Kick questionnaire     Fear of Current or Ex-Partner: No     Emotionally Abused: No     Physically Abused: No     Sexually Abused: No   Depression: Not at risk (05/03/2022)    PHQ-2     PHQ-2 Score: 0   Social Connections: Unknown (05/28/2019)    Social Connection and Isolation Panel [NHANES]     Frequency of Communication with Friends and Family: More than three times a week     Frequency of Social Gatherings with Friends and Family: Twice a week     Attends Religious Services: Patient refused     Database administrator or Organizations: Yes     Attends Engineer, structural: Not on file     Marital Status: Married     Would you be willing to receive help with any of the needs that you have identified today? Not applicable       [name], [facility]  [facility] Specialty Pharmacist

## 2022-11-15 NOTE — Unmapped (Signed)
Pt in clinic for BMBX. Consents signed/witnessed and timeout performed. Pt to receive blood/plts after biopsy. Handoff report given to [facility], Charity fundraiser.

## 2022-11-15 NOTE — Unmapped (Addendum)
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 Multiple myeloma, remission status unspecified (CMS-HCC)  C90.00 Clinic Procedure Appointment Request     Clinic Procedure Appointment Request     Transfuse RBC     Prepare RBC     Transfuse Pheresis Platelets     Prepare Platelet Pheresis     Type and Screen     Send for Transfusion(ONC use only)     Send for Transfusion(ONC use only)     Transfuse Pheresis Platelets     Send for Transfusion(ONC use only)     Platelet count     Transfuse RBC      2. 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)  C92.00 Clinic Procedure Appointment Request     Clinic Procedure Appointment Request     Hematopathology Order     Hematopathology Order     Cytogenetics Cancer/FISH NON-BLOOD     Cytogenetics Cancer/FISH NON-BLOOD     DNA Extract and Hold     DNA Extract and Hold     RNA Extract and Hold     RNA Extract and Hold     Smear for Bone Marrow Patients     Smear for Bone Marrow Patients          Premedication: None  Driver confirmed: no    Ordering Provider: [name], MD    Clinician(s) Performing Procedure:  Willadean Carol PA-C and Lavonda Jumbo AGNP      Bone Marrow Aspirate and Biopsy, right side    The risks, benefits, and alternatives to the procedure were discussed. All questions were answered. The patient verbalized understanding and signed informed consent. After a time-out in which his patient identifiers were checked by 2 providers, the patient was laid in prone position on the table.   The  posterior superior iliac spine and iliac crest were cleaned, prepped and draped in the usual sterile fashion. A 3 mm skin biopsy was obtained as her the BEAT-AML study protocol    Anesthetic agent used: 2% plain lidocaine.      Utilizing a Ranfac needle, a bone marrow aspiration and biopsy was performed.  Specimen was sent for routine histopathologic stains and sectioning, flow cytometry, cytogenetics, and molecular analysis.     A pressure dressing was applied to the biopsy site. Steri strips also applied to the skin biopsy site.   Patient tolerated the procedure well.  Hemostasis was confirmed upon discharge.     The patient was given verbal instructions for wound care, such as to keep the biopsy site dry, covered for 24 hours, and to call your physician for a temperature >  100.5.  Tylenol may be taken for discomfort.    Specimens Collected:  EDTA x 2  Heparin x 2  ACD x 1  Core biopsy x 2  Skin Biopsy x 1

## 2022-11-16 DIAGNOSIS — C9200 Acute myeloblastic leukemia, not having achieved remission: Principal | ICD-10-CM

## 2022-11-16 NOTE — Unmapped (Signed)
Dr. [name] and I met with the patient and  to discuss consent for study [facility] 16-001-M1 and S12. The consent form was reviewed, including discussion of risks and benefits, that the treatment involves research, review of charges covered/not covered by study, medications/treatments used, procedures, confidentiality, time commitments involved, study contact list, the option to withdraw at any time and required use of birth control (if applicable). Alternatives to study participation were discussed.     The patient had ample time to consider participation in the study, in the absence of coercion or undue influence. Patient was offered an opportunity to ask questions and these questions were answered to their satisfaction.   Patient verbalized understanding of information presented. The patient has signed and dated the following documents in my presence: HIPAA Authorization Form (version date 08/03/21)    Informed consent was also signed for [facility] 16-001- M1 (01/10/22) and [facility] 16-001-S12 (03/06/22)    Signed and dated copies of all consent documents were given to the patient. Every effort to maintain confidentiality will be employed. No procedures specifically related to the research were performed prior to informed consent being signed.

## 2022-11-16 NOTE — Unmapped (Signed)
Patient arrived in chair 1 for scheduled transfusion. Pt tolerated 1 unit of platelets and 2 units of PRBCs. Pt's initial platelet cont was 19 and the post platelet redraw was 29. The patient has a lab check on 12/22. PT discharged home with family.

## 2022-11-16 NOTE — Unmapped (Signed)
Encounter addended by: Haig Prophet, PA on: 11/15/2022 8:02 PM   Actions taken: Clinical Note Signed

## 2022-11-16 NOTE — Unmapped (Incomplete)
Clinical Study: BAML-16-001-S12: A Randomized Phasae 2 Trial of 28 Day (Arm A) versus 14 Day (Arm B) Schedule of Venetoclax (Ven) + Azacitidine (Aza) in newly diagnosed acute myloid leukemia (AML) patients > 60 years       Cycle / Day: Screening  DOS: 11/14/20  Performance Status: 1  Subject #: [id number]  Cohort / Treatment: TBD      Medical / Surgical History  Medical History Start Date End Date Grade Related to Prior Therapy? (Y / N)   Benign prostatic hyperplasia with lower urinary tract symptoms        BPH (benign prostatic hyperplasia)        Diarrhea       fatigue       Left arm fractures       Glucose intolerance 03/2018      H/O autologous stem cell transplant (CMS-HCC)        History of Atrial Fibrillation 03/15/2021      Hypertension       Multiple myeloma       prediabetes              BASELINE LABS       Anemia 11/15/22  3    White blood cell decrease 11/15/22  2                            Oral Study Medication Compliance  Oral Medication # of Pills Dispensed # of Pills Taken Expected Returned Actual Returned % Compliance                   Date and reason for any missed dose(s):    Previous diary returned: No- patient in screening  Reason for discrepancies between diary and pill count:   New diary issued: No- patient in screening  Educational and/or other handouts issued:  Patient education performed:       Eligibility Criteria (Not Otherwise Documented):   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  She does not have myeloid sarcoma, or APL.   No evidence of CNS disease and he does not require therapy for leukostasis.   No DIC, active bleeding or signs of thrombosis.    No psychological, familial, social, geographic, or other factor including significant medical conditions, which would preclude her from treatment or following the protocol requirements.   No known active Human Immunodeficiency Virus (HIV), active hepatitis B or active hepatitisC infection.   Patient has not received an investigational agent is one for which there is no approved indication by the [place] FDA.   Patient does not have uncontrolled intercurrent illness including, but not limited to, symptomatic congestive heart failure, unstable angina pectoris, serious cardiac arrhythmia, myocardial infarction with evidence of residual abnormalities within 6 months prior to enrollment (Troponin leak alone not included if no residual dysfunction),New York Heart Association (NYHA) Class III or IV heart failure, severe uncontrolled ventricular arrhythmias, or electrocardiographic evidence of acute ischemia or active conduction system abnormalities.   No uncontrolled infection.   Patient is able to take oral medications and able to comply with the protocol.   Patient is not a WOCBP.   Patient is not able or willing to receive intensive induction chemotherapy (see provider's note)   Patient does not have another malignancy, life expectancy for all other co-morbidities in medical history is greater than 1 year.     Research Plan:  Patient consented to the [facility] M1 and S12 substudy on Wednesday, December 20th, 2023. Pateint received  a bmbx on 11/15/22. Research labs for M1 and S12 for screening and correlatives were drawn. Patient will start C1D1on the BAML-16-001 S12 sub-study on 11/22/2022. She has been scheduled for 7 days of Azacitidine injections. Patient and son has study contact information in case they have any questions or concerns.                                Adverse Events   AE Start Date End Date Grade Attribution Clinically Significant?  (N / Y: Action Taken)                                                                                   All lab values and assessments were reviewed by the investigator and are considered not clinically significant unless otherwise noted.    Concomitant Medications   Medication Dose Start Date End Date Indication Related AE, if any   Calcium Carbonate 400mg  7/3/202  2 tablets for calcium    Cholecalciferol 25 mcg 09/20/20  1 tablet for vitamin D    Cholestyramine 4 grams 07/21/20  4grams in water for diarrhea    B12 2,000 mcg 04/04/22  2,000 mcg by mouth everyday    Diltiazem (Cardizem) 120 Mg       Tiazec 120 mg       Ferrous sulfate 325 mg       Flonase 50 mcg       Claritin 10 mg       Toprol 50 mg       omeprazole 40 mg       Revatio 20 mg       Tamsulosin .4mg        Trazodone 50 mg       Valtrex 500 mg

## 2022-11-17 ENCOUNTER — Institutional Professional Consult (permissible substitution): Admit: 2022-11-17 | Discharge: 2022-11-18 | Payer: MEDICARE

## 2022-11-17 ENCOUNTER — Ambulatory Visit: Admit: 2022-11-17 | Discharge: 2022-11-18 | Payer: MEDICARE

## 2022-11-17 DIAGNOSIS — C9200 Acute myeloblastic leukemia, not having achieved remission: Principal | ICD-10-CM

## 2022-11-17 DIAGNOSIS — C9000 Multiple myeloma not having achieved remission: Principal | ICD-10-CM

## 2022-11-17 LAB — APTT
APTT: 26.2 s (ref 24.8–38.4)
HEPARIN CORRELATION: 0.2

## 2022-11-17 LAB — CBC W/ AUTO DIFF
BASOPHILS ABSOLUTE COUNT: 0 10*9/L (ref 0.0–0.1)
BASOPHILS RELATIVE PERCENT: 0.8 %
EOSINOPHILS ABSOLUTE COUNT: 0 10*9/L (ref 0.0–0.5)
EOSINOPHILS RELATIVE PERCENT: 0 %
HEMATOCRIT: 26.2 % — ABNORMAL LOW (ref 39.0–48.0)
HEMOGLOBIN: 9.1 g/dL — ABNORMAL LOW (ref 12.9–16.5)
LYMPHOCYTES ABSOLUTE COUNT: 1.4 10*9/L (ref 1.1–3.6)
LYMPHOCYTES RELATIVE PERCENT: 48.5 %
MEAN CORPUSCULAR HEMOGLOBIN CONC: 34.7 g/dL (ref 32.0–36.0)
MEAN CORPUSCULAR HEMOGLOBIN: 31.6 pg (ref 25.9–32.4)
MEAN CORPUSCULAR VOLUME: 91.1 fL (ref 77.6–95.7)
MEAN PLATELET VOLUME: 7.9 fL (ref 6.8–10.7)
MONOCYTES ABSOLUTE COUNT: 0.6 10*9/L (ref 0.3–0.8)
MONOCYTES RELATIVE PERCENT: 21.4 %
NEUTROPHILS ABSOLUTE COUNT: 0.8 10*9/L — ABNORMAL LOW (ref 1.8–7.8)
NEUTROPHILS RELATIVE PERCENT: 29.3 %
NUCLEATED RED BLOOD CELLS: 0 /100{WBCs} (ref <=4)
PLATELET COUNT: 27 10*9/L — ABNORMAL LOW (ref 150–450)
RED BLOOD CELL COUNT: 2.88 10*12/L — ABNORMAL LOW (ref 4.26–5.60)
RED CELL DISTRIBUTION WIDTH: 18 % — ABNORMAL HIGH (ref 12.2–15.2)
WBC ADJUSTED: 2.9 10*9/L — ABNORMAL LOW (ref 3.6–11.2)

## 2022-11-17 LAB — MAGNESIUM: MAGNESIUM: 2 mg/dL (ref 1.6–2.6)

## 2022-11-17 LAB — COMPREHENSIVE METABOLIC PANEL
ALBUMIN: 3.8 g/dL (ref 3.4–5.0)
ALKALINE PHOSPHATASE: 120 U/L — ABNORMAL HIGH (ref 46–116)
ALT (SGPT): 17 U/L (ref 10–49)
ANION GAP: 8 mmol/L (ref 5–14)
AST (SGOT): 18 U/L (ref <=34)
BILIRUBIN TOTAL: 1 mg/dL (ref 0.3–1.2)
BLOOD UREA NITROGEN: 23 mg/dL (ref 9–23)
BUN / CREAT RATIO: 24
CALCIUM: 9.2 mg/dL (ref 8.7–10.4)
CHLORIDE: 106 mmol/L (ref 98–107)
CO2: 27.2 mmol/L (ref 20.0–31.0)
CREATININE: 0.97 mg/dL
EGFR CKD-EPI (2021) MALE: 81 mL/min/{1.73_m2} (ref >=60)
GLUCOSE RANDOM: 122 mg/dL (ref 70–179)
POTASSIUM: 3.8 mmol/L (ref 3.4–4.8)
PROTEIN TOTAL: 7 g/dL (ref 5.7–8.2)
SODIUM: 141 mmol/L (ref 135–145)

## 2022-11-17 LAB — URIC ACID: URIC ACID: 2.9 mg/dL — ABNORMAL LOW

## 2022-11-17 LAB — PROTIME-INR
INR: 0.99
PROTIME: 11 s (ref 9.9–12.6)

## 2022-11-17 LAB — LACTATE DEHYDROGENASE: LACTATE DEHYDROGENASE: 222 U/L (ref 120–246)

## 2022-11-17 LAB — PHOSPHORUS: PHOSPHORUS: 3.4 mg/dL (ref 2.4–5.1)

## 2022-11-17 NOTE — Unmapped (Addendum)
Infusion on 11/17/2022   Component Date Value Ref Range Status    EKG Ventricular Rate 11/17/2022 93  BPM Incomplete    EKG Atrial Rate 11/17/2022 93  BPM Incomplete    EKG P-R Interval 11/17/2022 172  ms Incomplete    EKG QRS Duration 11/17/2022 92  ms Incomplete    EKG Q-T Interval 11/17/2022 358  ms Incomplete    EKG QTC Calculation 11/17/2022 445  ms Incomplete    EKG Calculated P Axis 11/17/2022 56  degrees Incomplete    EKG Calculated R Axis 11/17/2022 43  degrees Incomplete    EKG Calculated T Axis 11/17/2022 47  degrees Incomplete    QTC Fredericia 11/17/2022 414  ms Incomplete   Clinical Support on 11/17/2022   Component Date Value Ref Range Status    Blood Type 11/17/2022 A POS   Final    Antibody Screen 11/17/2022 NEG   Final    Sodium 11/17/2022 141  135 - 145 mmol/L Final    Potassium 11/17/2022 3.8  3.4 - 4.8 mmol/L Final    Chloride 11/17/2022 106  98 - 107 mmol/L Final    CO2 11/17/2022 27.2  20.0 - 31.0 mmol/L Final    Anion Gap 11/17/2022 8  5 - 14 mmol/L Final    BUN 11/17/2022 23  9 - 23 mg/dL Final    Creatinine 09/81/1914 0.97  0.73 - 1.18 mg/dL Final    BUN/Creatinine Ratio 11/17/2022 24   Final    eGFR CKD-EPI (2021) Male 11/17/2022 81  >=60 mL/min/1.28m2 Final    eGFR calculated with CKD-EPI 2021 equation in accordance with [employer] and [employer] of Nephrology Task Force recommendations.    Glucose 11/17/2022 122  70 - 179 mg/dL Final    Calcium 78/29/5621 9.2  8.7 - 10.4 mg/dL Final    Albumin 30/86/5784 3.8  3.4 - 5.0 g/dL Final    Total Protein 11/17/2022 7.0  5.7 - 8.2 g/dL Final    Total Bilirubin 11/17/2022 1.0  0.3 - 1.2 mg/dL Final    AST 69/62/9528 18  <=34 U/L Final    ALT 11/17/2022 17  10 - 49 U/L Final    Alkaline Phosphatase 11/17/2022 120 (H)  46 - 116 U/L Final    Uric Acid 11/17/2022 2.9 (L)  3.7 - 9.2 mg/dL Final    Magnesium 41/32/4401 2.0  1.6 - 2.6 mg/dL Final    Phosphorus 02/72/5366 3.4  2.4 - 5.1 mg/dL Final    LDH 44/01/4741 222  120 - 246 U/L Final    PT 11/17/2022 11.0  9.9 - 12.6 sec Final    INR 11/17/2022 0.99   Final    APTT 11/17/2022 26.2  24.8 - 38.4 sec Final    Heparin Correlation 11/17/2022 0.2   Final    NOTE: This result is for use only with patients receiving heparin therapy in conjunction with the Heparin Nomogram.  In patients not receiving heparin, an elevated aPTT does not always imply anticoagulation.    WBC 11/17/2022 2.9 (L)  3.6 - 11.2 10*9/L Final    RBC 11/17/2022 2.88 (L)  4.26 - 5.60 10*12/L Final    HGB 11/17/2022 9.1 (L)  12.9 - 16.5 g/dL Final    HCT 59/56/3875 26.2 (L)  39.0 - 48.0 % Final    MCV 11/17/2022 91.1  77.6 - 95.7 fL Final    MCH 11/17/2022 31.6  25.9 - 32.4 pg Final    MCHC 11/17/2022 34.7  32.0 -  36.0 g/dL Final    RDW 40/08/2724 18.0 (H)  12.2 - 15.2 % Final    MPV 11/17/2022 7.9  6.8 - 10.7 fL Final    Platelet 11/17/2022 27 (L)  150 - 450 10*9/L Final    nRBC 11/17/2022 0  <=4 /100 WBCs Final    Neutrophils % 11/17/2022 29.3  % Final    Lymphocytes % 11/17/2022 48.5  % Final    Monocytes % 11/17/2022 21.4  % Final    Eosinophils % 11/17/2022 0.0  % Final    Basophils % 11/17/2022 0.8  % Final    Absolute Neutrophils 11/17/2022 0.8 (L)  1.8 - 7.8 10*9/L Final    Absolute Lymphocytes 11/17/2022 1.4  1.1 - 3.6 10*9/L Final    Absolute Monocytes 11/17/2022 0.6  0.3 - 0.8 10*9/L Final    Absolute Eosinophils 11/17/2022 0.0  0.0 - 0.5 10*9/L Final    Absolute Basophils 11/17/2022 0.0  0.0 - 0.1 10*9/L Final    Anisocytosis 11/17/2022 Slight (A)  Not Present Final          RED ZONE Means: RED ZONE: Take action now!     You need to be seen right away  Symptoms are at a severe level of discomfort    Call 911 or go to your nearest  Hospital for help     - Bleeding that will not stop    - Hard to breathe    - New seizure - Chest pain  - Fall or passing out  -Thoughts of hurting    yourself or others      Call 911 if you are going into the RED ZONE                  YELLOW ZONE Means:     Please call with any new or worsening symptom(s), even if not on this list.  Call [phone]  After hours, weekends, and holidays - you will reach a long recording with specific instructions, If not in an emergency such as above, please listen closely all the way to the end and choose the option that relates to your need.   You can be seen by a provider the same day through our Same Day Acute Care for Patients with Cancer program.      YELLOW ZONE: Take action today     Symptoms are new or worsening  You are not within your goal range for:    - Pain    - Shortness of breath    - Bleeding (nose, urine, stool, wound)    - Feeling sick to your stomach and throwing up    - Mouth sores/pain in your mouth or throat    - Hard stool or very loose stools (increase in       ostomy output)    - No urine for 12 hours    - Feeding tube or other catheter/tube issue    - Redness or pain at previous IV or port/catheter site    - Depressed or anxiety   - Swelling (leg, arm, abdomen,     face, neck)  - Skin rash or skin changes  - Wound issues (redness, drainage,    re-opened)  - Confusion  - Vision changes  - Fever >100.4 F or chills  - Worsening cough with mucus that is    green, yellow, or bloody  - Pain or burning when going to the    bathroom  - Home Infusion  Pump Issue- call    [phone]         Call your healthcare provider if you are going into the YELLOW ZONE     GREEN ZONE Means:  Your symptoms are under controls  Continue to take your medicine as ordered  Keep all visits to the provider GREEN ZONE: You are in control  No increase or worsening symptoms  Able to take your medicine  Able to drink and eat    - DO NOT use MyChart messages to report red or yellow symptoms. Allow up to 3    business days for a reply.  -MyChart is for non-urgent medication refills, scheduling requests, or other general questions.         [id number] Rev. 05/26/2022  Approved by Oncology Patient Education Committee

## 2022-11-17 NOTE — Unmapped (Signed)
Patient arrived to infusion chair ambulatory in stable condition with use of his cane for today's scheduled appointment for possible transfusion. PIV started with nurse/lab visit and labs drawn. Patient stated he needed study labs drawn today. Order in for EKG but no study tubes found in lab, infusion or clinic for research. Possible tubes are in [facility] as patient comes to [facility] for transfusions and labs. Labs reviewed and no blood products needed today. EKG performed in infusion chair and scanned into media. PIV removed, site without redness, tenderness or swelling noted. Site dressed with 2x2 and coban. Patient discharged ambulatory with use of his cane and verbalizing no complaints to home with self  care.

## 2022-11-21 DIAGNOSIS — C9200 Acute myeloblastic leukemia, not having achieved remission: Principal | ICD-10-CM

## 2022-11-22 ENCOUNTER — Encounter: Admit: 2022-11-22 | Discharge: 2022-11-23 | Payer: MEDICARE | Attending: Hematology | Primary: Hematology

## 2022-11-22 ENCOUNTER — Ambulatory Visit: Admit: 2022-11-22 | Discharge: 2022-11-23 | Payer: MEDICARE | Attending: Adult Health | Primary: Adult Health

## 2022-11-22 ENCOUNTER — Other Ambulatory Visit: Admit: 2022-11-22 | Discharge: 2022-11-23 | Payer: MEDICARE

## 2022-11-22 ENCOUNTER — Ambulatory Visit: Admit: 2022-11-22 | Discharge: 2022-11-23 | Payer: MEDICARE

## 2022-11-22 DIAGNOSIS — C9200 Acute myeloblastic leukemia, not having achieved remission: Principal | ICD-10-CM

## 2022-11-22 LAB — COMPREHENSIVE METABOLIC PANEL
ALBUMIN: 3.9 g/dL (ref 3.4–5.0)
ALKALINE PHOSPHATASE: 112 U/L (ref 46–116)
ALT (SGPT): 23 U/L (ref 10–49)
ANION GAP: 7 mmol/L (ref 5–14)
AST (SGOT): 23 U/L (ref <=34)
BILIRUBIN TOTAL: 1 mg/dL (ref 0.3–1.2)
BLOOD UREA NITROGEN: 22 mg/dL (ref 9–23)
BUN / CREAT RATIO: 25
CALCIUM: 8.2 mg/dL — ABNORMAL LOW (ref 8.7–10.4)
CHLORIDE: 108 mmol/L — ABNORMAL HIGH (ref 98–107)
CO2: 24 mmol/L (ref 20.0–31.0)
CREATININE: 0.88 mg/dL
EGFR CKD-EPI (2021) MALE: 90 mL/min/{1.73_m2} (ref >=60)
GLUCOSE RANDOM: 126 mg/dL (ref 70–179)
POTASSIUM: 3.9 mmol/L (ref 3.4–4.8)
PROTEIN TOTAL: 6.9 g/dL (ref 5.7–8.2)
SODIUM: 139 mmol/L (ref 135–145)

## 2022-11-22 LAB — CBC W/ AUTO DIFF
BASOPHILS ABSOLUTE COUNT: 0 10*9/L (ref 0.0–0.1)
BASOPHILS RELATIVE PERCENT: 0.8 %
EOSINOPHILS ABSOLUTE COUNT: 0 10*9/L (ref 0.0–0.5)
EOSINOPHILS RELATIVE PERCENT: 0 %
HEMATOCRIT: 24.1 % — ABNORMAL LOW (ref 39.0–48.0)
HEMOGLOBIN: 8.5 g/dL — ABNORMAL LOW (ref 12.9–16.5)
LYMPHOCYTES ABSOLUTE COUNT: 1.1 10*9/L (ref 1.1–3.6)
LYMPHOCYTES RELATIVE PERCENT: 50.7 %
MEAN CORPUSCULAR HEMOGLOBIN CONC: 35.3 g/dL (ref 32.0–36.0)
MEAN CORPUSCULAR HEMOGLOBIN: 31.8 pg (ref 25.9–32.4)
MEAN CORPUSCULAR VOLUME: 89.9 fL (ref 77.6–95.7)
MEAN PLATELET VOLUME: 7.9 fL (ref 6.8–10.7)
MONOCYTES ABSOLUTE COUNT: 0.5 10*9/L (ref 0.3–0.8)
MONOCYTES RELATIVE PERCENT: 21.1 %
NEUTROPHILS ABSOLUTE COUNT: 0.6 10*9/L — ABNORMAL LOW (ref 1.8–7.8)
NEUTROPHILS RELATIVE PERCENT: 27.4 %
PLATELET COUNT: 21 10*9/L — ABNORMAL LOW (ref 150–450)
RED BLOOD CELL COUNT: 2.68 10*12/L — ABNORMAL LOW (ref 4.26–5.60)
RED CELL DISTRIBUTION WIDTH: 17.8 % — ABNORMAL HIGH (ref 12.2–15.2)
WBC ADJUSTED: 2.2 10*9/L — ABNORMAL LOW (ref 3.6–11.2)

## 2022-11-22 LAB — PHOSPHORUS: PHOSPHORUS: 2.6 mg/dL (ref 2.4–5.1)

## 2022-11-22 LAB — LACTATE DEHYDROGENASE: LACTATE DEHYDROGENASE: 230 U/L (ref 120–246)

## 2022-11-22 LAB — MAGNESIUM: MAGNESIUM: 1.8 mg/dL (ref 1.6–2.6)

## 2022-11-22 LAB — URIC ACID: URIC ACID: 3.1 mg/dL — ABNORMAL LOW

## 2022-11-22 MED ORDER — LEVOFLOXACIN 500 MG TABLET
ORAL_TABLET | Freq: Every day | ORAL | 1 refills | 30 days | Status: CP
Start: 2022-11-22 — End: ?

## 2022-11-22 MED ORDER — ONDANSETRON HCL 8 MG TABLET
ORAL_TABLET | Freq: Three times a day (TID) | ORAL | 2 refills | 10 days | Status: CP | PRN
Start: 2022-11-22 — End: 2023-11-22

## 2022-11-22 MED ORDER — POSACONAZOLE 100 MG TABLET,DELAYED RELEASE
ORAL_TABLET | Freq: Every day | ORAL | 2 refills | 30 days | Status: CP
Start: 2022-11-22 — End: 2022-11-22

## 2022-11-22 MED ORDER — VORICONAZOLE 200 MG TABLET
ORAL_TABLET | Freq: Two times a day (BID) | ORAL | 5 refills | 30 days | Status: CP
Start: 2022-11-22 — End: ?

## 2022-11-22 MED ORDER — TAMSULOSIN 0.4 MG CAPSULE
ORAL_CAPSULE | Freq: Every day | ORAL | 0 refills | 90 days | Status: CP
Start: 2022-11-22 — End: ?

## 2022-11-22 MED ADMIN — azaCITIDine (VIDAZA) syringe: 75 mg/m2 | SUBCUTANEOUS | @ 20:00:00 | Stop: 2022-11-22

## 2022-11-22 MED ADMIN — ondansetron (ZOFRAN) tablet 8 mg: 8 mg | ORAL | @ 19:00:00 | Stop: 2022-11-22

## 2022-11-22 MED ADMIN — allopurinol (ZYLOPRIM) tablet 300 mg: 300 mg | ORAL | @ 18:00:00 | Stop: 2022-11-22

## 2022-11-22 MED ADMIN — sodium chloride 0.9% (NS) bolus 500 mL: 500 mL | INTRAVENOUS | @ 19:00:00 | Stop: 2022-11-22

## 2022-11-22 NOTE — Unmapped (Addendum)
Great speaking with you regarding your new chemotherapy regimen! Here's a summary of what we discussed:    You will be receiving 2 types of chemotherapy as part of this regimen. One type is an injection, called azacitidine. You will receive this as a shot for 7 straight days in our infusion center. They will give you some anti-nausea medication each day prior to your injection, called Zofran. This can sometimes cause constipation, so feel free to add stool softener or Miralax to your regimen if needed. Please only take the cholestyramine ONCE a day in the morning going forward.     The other chemotherapy called venetoclax is in a tablet form that you will take at home. I pasted a handout below. You will take this everyday at home WITH FOOD. In order to prevent something called tumor lysis syndrome, we will do this in a ramp-up as follows:  Day 1 = 1 tablet  Day 2 = 2 tablets  Day 3 and beyond = 4 tablets    We may start a preventative antifungal in the future. This is often something called posaconazole (Noxafil). This INTERACTS with your venetoclax, so we will ask you to dose reduce the venetoclax to just 1 tablet daily when the antifungal medication is started. If you receive the posaconazole in the mail, please do NOT start it until we discuss this dose change. More to come    Continue other infection prevention medications including levofloxacin and valtrex once daily.     Continue allopurinol daily and try to stay hydrated (aim for drinking 6-8 glasses of water per day). We will also give you some IV fluids in the infusion center several days of the first week of chemo. Allopurinol and hydrating helps to prevent tumor lysis syndrome.     You will get calls from our team or have visits in clinic throughout your first cycle to check in. You will also need labs at least twice weekly after the first week so we can monitor for transfusion needs. Keep an eye on MyChart and your visit summaries regarding appointments. Venetoclax (Venclexta??) Chemotherapy    This chemotherapy regimen is used to treat acute myeloid leukemia. Venetoclax is an oral tablet that will be taken by mouth every day. You will start this medication following review of labs in clinic on Day 1. You will start on the same day you begin azacitidine injections. Your cancer care team will let you know when to change the dose of the medication.     Drug Name How is it given? On what day?   Venetoclax (Venclexta??)   100 mg (1 x 100 mg tablet) by mouth daily with a meal   Day 1      200 mg (2 x 100 mg tablet) by mouth daily with a meal   Day 2      400 mg (4 x 100 mg tablets) by mouth daily with a meal   Day 3 and beyond        HOW THE TREATMENT IS GIVEN  Venetoclax will be started in the outpatient clinic of the [facility]. It is important to continue taking the medication every day with a meal and a full glass of water, drink at least 6 glasses of water per day for the first 5 weeks, and let your cancer care team know immediately if you experience anything out of the ordinary.     While in the infusion clinic your cancer care team will provide you  with fluids to make sure you stay hydrated. Before you start chemotherapy, you will also be given a medication to prevent a side effect of the chemotherapy called tumor lysis syndrome (TLS). TLS is when the cancer cells die and the contents of the cancer cells spill out into the blood stream, which can make you sick. The medication to prevent TLS is called allopurinol (Zyloprim??). You should take a daily dose of allopurinol by mouth every day, starting at least 3 days before you start venetoclax.     Chemotherapy can interact with other drugs, vitamins, or herbal products.  Let your physician or pharmacist know your current medication list and any new medications you may start during therapy, so that they may check for potential drug interactions.      SIDE EFFECTS             Not everyone who gets the same chemotherapy responds the same way.  Some people have very few side effects while others may experience more.  If you do experience side effects, it important to tell your health care provider.  The most common side effects are listed below.  Side effects you may experience during or immediately after starting Venetoclax:     Nausea, upset stomach, and vomiting: You may be given medications to take at home to prevent and treat nausea.  The medications may include one or more of the following:  Prochloroperazine (Compazine??)  Ondansetron (Zofran??)    Tumor lysis syndrome (TLS): When your cancer cells die, the contents of your cancer cells spill into your blood stream. This can cause the following blood chemistry changes which can make you sick:    Increase in potassium levels (hyperkalemia): You may experience weakness, fatigue, or abnormal heart rhythms. Let your cancer care team know immediately if you experience these symptoms.     Increase in phosphate levels (hyperphosphatemia): You may experience spasms, cramping, or numbness. Let your cancer care team know immediately if you experience these symptoms.     Increase in uric acid levels (hyperuricemia): You may have problems with urination which could include painful urination or developing blood in the urine. You may also develop pain in the stomach or lower back area. Let your cancer care team know immediately if you experience these symptoms.     Decrease in calcium levels (hypocalcemia): You may experience spasms, cramping, or numbness. Let your cancer care team know immediately if you experience these symptoms.       Side effects you may experience several days to weeks after starting Venetoclax:    Increased risk of infection: White blood cells, produced by your bone marrow, are responsible for fighting infection.  This chemotherapy may decrease the number of these cells causing you to be more at risk for developing an infection. To reduce your risk for infection, wash your hands often with soap and water and avoid large groups of people or people who are sick or have colds.        Make sure you have an oral thermometer at home and see your doctor or go to the closest emergency room right away if:  Your temperature equals or goes above 100.5??F (38??C)   You suddenly feel unwell (even with a normal temperature)    Do not take over-the-counter fever-reducing medications such as acetaminophen (Tylenol??), ibuprofen (Advil??) or aspirin unless instructed by your doctor.    Decreased red blood cells (anemia):  Your bone marrow also produces red blood cells that carry oxygen  throughout your body.  This chemotherapy can reduce the number of red blood cells which can make you feel tired or weak.  Let your doctor know if you have shortness of breath, dizziness, or difficulty breathing as this may be a sign that your red blood cells are low.  If the count becomes too low, you may be given a blood transfusion.  Increased bruising or bleeding:  In addition to red and white blood cells, your bone marrow also produces cells called platelets.  Platelets help your blood to clot.  Chemotherapy can reduce the production of platelets, putting you at higher risk for bleeding.  To decrease this risk, use an electric razor for shaving, a soft bristle tooth brush, avoid playing contact sports, and avoid taking aspirin, ibuprofen (Advil??), or naproxen (Aleve??) without talking to your cancer care team first.   Feeling tired, run down:  This is a very common side effect during chemotherapy and may be worse after you have had several cycles of treatment.  Light exercise and staying well hydrated (with water or drinks like diluted Gatorade or Pedialyte) may help to improve energy, appetite, and quality of life.  You may also need to take short naps throughout the day.  Keep your health professionals informed about how you are feeling, especially if you feel that you are not able to do what you enjoy doing the most.        Reproductive Issues:    Pregnancy:  Chemotherapy can have harmful and potentially deadly effects on unborn children. Therefore you should use appropriate birth control methods while you are receiving chemotherapy and for several months after.  Your doctor can recommend appropriate birth control methods, though barrier methods, such as condoms, are preferred.      This education material is to supplement and support, but not replace, the conversation you have with your oncologist or cancer care team.

## 2022-11-22 NOTE — Unmapped (Signed)
Client took allopurinol prescription today at 1319 s/p eating large meal for lunch. Client states he just got the Ventoclax and is supposed to start regime after dinner this evening. Edwyna Shell and [employer] coordinator notified.  Client completed injection and bolus without complication. Client stable at discharge at ambulatory.

## 2022-11-22 NOTE — Unmapped (Cosign Needed)
Clinical Study: BAML-16-001-S12: A Randomized Phasae 2 Trial of 28 Day (Arm A) versus 14 Day (Arm B) Schedule of Venetoclax (Ven) + Azacitidine (Aza) in newly diagnosed acute myloid leukemia (AML) patients > 60 years       Cycle / Day: Cycle 1  DOS: 11/22/22  Performance Status: 1  Subject #: [id number]  Cohort / Treatment: 28 days Venetoclax.    Narrative: Patient in clinic today for cycle 1 with Lavonda Jumbo. Patient will begin allopurinol today and will also have 4 days of fluids to assist kidneys. Patient was educated about TLS and what precautions will be taken to monitor for this. Today will also begin days 1-7 of azacitidine.Will return for cycle 1 bone marrow biopsy on 12/13/21. Patient will need additional  transfusion days added on Saturdays or Sundays. Patient reports fatigue and low p/o intake, gastric distress and soft stools.Patient informed of nausea medication possibly causing constipation. Patient will get prescription for zofran today. Physical exam performed. Provider examined patients left toes-looks a bit infected, instructed to file and clip.      Medical / Surgical History  Medical History Start Date End Date Grade Related to Prior Therapy? (Y / N)   Benign prostatic hyperplasia with lower urinary tract symptoms        BPH (benign prostatic hyperplasia)        Diarrhea       fatigue       Left arm fractures       Glucose intolerance 03/2018      H/O autologous stem cell transplant (CMS-HCC)        History of Atrial Fibrillation 03/15/2021      Hypertension       Multiple myeloma       prediabetes              BASELINE LABS       Anemia 11/15/22  3 No   White blood cell decrease 11/15/22  2 No   Platelet count decrease 11/15/22  3 No   Neutrophil count decrease 11/15/22  3 No   Lymphocyte cell decrease 11/15/22  1 No                           Oral Study Medication Compliance  Oral Medication # of Pills Dispensed # of Pills Taken Expected Returned Actual Returned % Compliance   Venetoclax 28 Date and reason for any missed dose(s):    Previous diary returned: No- patient given first cycle 1 diary on 11/22/22  Reason for discrepancies between diary and pill count:   New diary issued: No- Yes. Patient diary was given on 11/22/22  Educational and/or other handouts issued:  Patient education performed: Patient saw pharmacists team to go over oral medications.      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  She does not have myeloid sarcoma, or APL.   No evidence of CNS disease and he does not require therapy for leukostasis.   No DIC, active bleeding or signs of thrombosis.    No psychological, familial, social, geographic, or other factor including significant medical conditions, which would preclude her from treatment or following the protocol requirements.   No known active Human Immunodeficiency Virus (HIV), active hepatitis B or active hepatitisC infection.   Patient has not received an investigational agent is one for which there is no approved indication by  the [place] FDA.   Patient does not have uncontrolled intercurrent illness including, but not limited to, symptomatic congestive heart failure, unstable angina pectoris, serious cardiac arrhythmia, myocardial infarction with evidence of residual abnormalities within 6 months prior to enrollment (Troponin leak alone not included if no residual dysfunction),New York Heart Association (NYHA) Class III or IV heart failure, severe uncontrolled ventricular arrhythmias, or electrocardiographic evidence of acute ischemia or active conduction system abnormalities.   No uncontrolled infection.   Patient is able to take oral medications and able to comply with the protocol.   Patient is not a WOCBP.   Patient is not able or willing to receive intensive induction chemotherapy (see provider's note)   Patient does not have another malignancy, life expectancy for note)   Patient does not have another malignancy, life expectancy for all other co-morbidities in medical history is greater than 1 year.     Research Plan:  Patient consented to the [facility] M1 and S12 substudy on Wednesday, December 20th, 2023. Pateint received  a bmbx on 11/15/22. Research labs for M1 and S12 for screening and correlatives were drawn. Patient will start C1D1on the BAML-16-001 S12 sub-study on 11/22/2022. He has been scheduled for 7 days of Azacitidine injections. Patient and has study contact information in case they have any questions or concerns.  updated: 11/22/22: all screening completed at this time. Patient begins cycle 1 on 11/22/22.              Adverse Events   AE Start Date End Date Grade Attribution Clinically Significant?  (N / Y: Action Taken)   Fatigue 11/22/22  1 Not related N   Low appetite 11/22/22  1 Not related N   Toe infecton 11/22/22  1 Not related N                   White blood cell count decrease 11/22/22  2 Not related N   Anemia 11/22/22  2 Not related N   Platelet count decrease 11/22/22  4 Not related N   Neutrophil count decrease 11/22/22  3 Not related N                                   All lab values and assessments were reviewed by the investigator and are considered not clinically significant unless otherwise noted.    Concomitant Medications   Medication Dose Start Date End Date Indication Related AE, if any   Calcium Carbonate 400mg  7/3/202  2 tablets for calcium    Cholecalciferol 25 mcg 09/20/20  1 tablet for vitamin D    Cholestyramine 4 grams 07/21/20  4grams in water for diarrhea    B12 2,000 mcg 04/04/22  2,000 mcg by mouth everyday    Diltiazem (Cardizem) 120 Mg   1 tablet by mouth    Tiazec 120 mg   1 tablet by mouth    Ferrous sulfate 325 mg  11/22/22 1 tablet by mouth    Flonase 50 mcg   Intranasal 1x a day    Claritin 10 mg   1 tablet by mouth    Toprol 50 mg   1 tablet by mouth    omeprazole 40 mg   1 tablet by mouth    Revatio 20 mg   1-5 tablets as needed    Tamsulosin .4mg    1 tablet by mouth as needed    Trazodone  50 mg  11/22/22 1 tablet by mouth Not been using   Valtrex 500 mg   1 tablet by mouth    Study Medication        Azacitidine        Venetoclax

## 2022-11-22 NOTE — Unmapped (Signed)
Timothy Porter is a 76 y.o. male with AML who I am seeing in clinic today for oral chemotherapy education    Encounter Date: 11/22/2022    Current Treatment: Beat AML S12, azacitidine/venetoclax (venetoclax x 28 days), C1D1 today    For oral chemotherapy: venetoclax  Pharmacy: [facility]   Medication Access: $0 copay with grant    Interval History: I saw Timothy Porter and his wife in clinic to discuss dosing/administration, adverse effects, and monitoring for start of treatment with aza/ven. We discussed need for frequent lab monitoring, including transfusions and TLS lab monitoring. Also discussed need for infection prophylaxis. Reviewed medication list in detail and instructions provided in AVS. He confirms he has stopped diltiazem, no palpitations or symptoms in regards to having stopped that. He does take cholestyramine twice a day with breakfast and supper, this helps bowels though some fluctuation in bowel patterns. We discussed need to separate cholestyramine for other medications and potential for constipation with treatment start. He is open to trialing cholestyramine once a day only after breakfast and venetoclax in the evening after supper. Handed venetoclax supply to him in clinic.    Labs: WBC 2.2, ANC 0.6, Hgb 8.5, PLT 21; CMP and baseline TLS labs WNL  Vitals: BP 155/77; HR 91    Oncologic History:  Oncology History Overview Note   Referring/Local Oncologist:  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 This electronic signature is attestation that the pathologist personally reviewed the submitted material(s) and the final diagnosis reflects that evaluation.          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 11/04/18)  A Randomized Study of Daratumumab Plus Lenalidomide Versus Lenalidomide Alone as Maintenance Treatment in Patients with Newly Diagnosed Multiple Myeloma Who Are Minimal Residual Disease Positive After Frontline Autologous Stem Cell Transplant.     History of auto stem cell transplant (CMS-HCC)   08/05/2019 Initial Diagnosis    History of auto stem cell transplant (CMS-HCC)     10/22/2019 - 06/27/2022 Chemotherapy    STUDY [id number] IRB# [id number] LENALIDOMIDE (v. 11/04/18)  A Randomized Study of Daratumumab Plus Lenalidomide Versus Lenalidomide Alone as Maintenance Treatment in Patients with Newly Diagnosed Multiple Myeloma Who Are Minimal Residual Disease Positive After Frontline Autologous Stem Cell Transplant.     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)         Weight and Vitals:  Wt Readings from Last 3 Encounters:   11/22/22 (!) 122.6 kg (270 lb 4.5 oz)   11/14/22 (!) 122.7 kg (270 lb 8 oz)   11/09/22 (!) 122.9 kg (270 lb 15.1 oz)     Temp Readings from Last 3 Encounters:   11/22/22 36.4 ??C (97.6 ??F) (Temporal)   11/15/22 36.7 ??C (98 ??F) (Oral)   11/15/22 36.7 ??C (98.1 ??F) (Oral)     BP Readings from Last 3 Encounters:   11/22/22 155/77   11/15/22 151/72   11/15/22 159/81     Pulse Readings from Last 3 Encounters:   11/22/22 91   11/15/22 86   11/15/22 87       Pertinent Labs:  Lab on 11/22/2022   Component Date Value Ref Range Status    ABO Grouping 11/22/2022 A POS   Final    Antibody Screen 11/22/2022 NEG   Final    Sodium 11/22/2022 139  135 - 145 mmol/L Final    Potassium 11/22/2022 3.9  3.4 - 4.8 mmol/L Final    Chloride 11/22/2022 108 (H)  98 - 107 mmol/L Final    CO2 11/22/2022 24.0  20.0 - 31.0 mmol/L Final    Anion Gap 11/22/2022 7  5 - 14 mmol/L Final    BUN 11/22/2022 22  9 - 23 mg/dL Final    Creatinine 16/08/9603 0.88  0.73 - 1.18 mg/dL Final    BUN/Creatinine Ratio 11/22/2022 25   Final    eGFR CKD-EPI (2021) Male 11/22/2022 90  >=60 mL/min/1.51m2 Final    eGFR calculated with CKD-EPI 2021 equation in accordance with [employer] and [employer] of Nephrology Task Force recommendations.    Glucose 11/22/2022 126  70 - 179 mg/dL Final    Calcium 54/07/8118 8.2 (L)  8.7 - 10.4 mg/dL Final    Albumin 14/78/2956 3.9  3.4 - 5.0 g/dL Final    Total Protein 11/22/2022 6.9  5.7 - 8.2 g/dL Final    Total Bilirubin 11/22/2022 1.0  0.3 - 1.2 mg/dL Final    AST 21/30/8657 23  <=34 U/L Final    ALT 11/22/2022 23  10 - 49 U/L Final    Alkaline Phosphatase 11/22/2022 112  46 - 116 U/L Final    Magnesium 11/22/2022 1.8  1.6 - 2.6 mg/dL Final    Phosphorus 84/69/6295 2.6  2.4 - 5.1 mg/dL Final    LDH 28/41/3244 230  120 - 246 U/L Final    Uric Acid 11/22/2022 3.1 (L)  3.7 - 9.2 mg/dL Final    WBC 11/29/7251 2.2 (L)  3.6 - 11.2 10*9/L Final    RBC 11/22/2022 2.68 (L)  4.26 - 5.60 10*12/L Final    HGB 11/22/2022 8.5 (L)  12.9 - 16.5 g/dL Final    HCT 66/44/0347 24.1 (L)  39.0 - 48.0 % Final    MCV 11/22/2022 89.9  77.6 - 95.7 fL Final    MCH 11/22/2022 31.8  25.9 - 32.4 pg Final    MCHC 11/22/2022 35.3  32.0 - 36.0 g/dL Final    RDW 42/59/5638 17.8 (H)  12.2 - 15.2 % Final    MPV 11/22/2022 7.9  6.8 - 10.7 fL Final    Platelet 11/22/2022 21 (L)  150 - 450 10*9/L Final    Neutrophils % 11/22/2022 27.4  % Final  Lymphocytes % 11/22/2022 50.7  % Final    Monocytes % 11/22/2022 21.1  % Final    Eosinophils % 11/22/2022 0.0  % Final    Basophils % 11/22/2022 0.8  % Final    Absolute Neutrophils 11/22/2022 0.6 (L)  1.8 - 7.8 10*9/L Final    Absolute Lymphocytes 11/22/2022 1.1  1.1 - 3.6 10*9/L Final    Absolute Monocytes 11/22/2022 0.5  0.3 - 0.8 10*9/L Final    Absolute Eosinophils 11/22/2022 0.0  0.0 - 0.5 10*9/L Final    Absolute Basophils 11/22/2022 0.0  0.0 - 0.1 10*9/L Final    Anisocytosis 11/22/2022 Slight (A)  Not Present Final       Allergies: No Known Allergies    Drug Interactions: would need to adjust venetoclax with strong/moderate 3A4 and Pgp inhibitors; avoid strong 3A4 inducers; must separate from Pgp substrates; target dose of venetoclax with concurrent voriconazole is 100 mg daily. With start of voriconazole, caution with sildenafil (PRN for ED) and monitor for orthostasis with tamsulosin.    Current Medications:  Current Outpatient Medications   Medication Sig Dispense Refill    allopurinol (ZYLOPRIM) 300 MG tablet Take 1 tablet (300 mg total) by mouth daily. 30 tablet 2    calcium carbonate (TUMS) 200 mg calcium (500 mg) chewable tablet Chew 2 tablets (400 mg of elem calcium total) Three (3) times a day.      cholecalciferol, vitamin D3 25 mcg, 1,000 units,, 1,000 unit (25 mcg) tablet Take 1 tablet (25 mcg total) by mouth.      cholestyramine (QUESTRAN) 4 gram packet Take 1 packet by mouth in the morning. Mix 4 g (1 packet) into 2-3 ounces of fluid and take twice a day.. 60 each 2    cholestyramine (QUESTRAN) 4 gram packet TAKE 1 PACKET BY MOUTH EVERY MORNING. MIX 4 GRAMS INTO 2 TO 3 OZ OF FLUID AND TAKE TWICE DAILY 60 each 2    cyanocobalamin, vitamin B-12, 2,000 mcg Tab Take 2,000 mcg by mouth in the morning. 90 tablet 3    ferrous sulfate 325 (65 FE) MG tablet Take 1 tablet (325 mg total) by mouth daily. 30 tablet 3    fluticasone propionate (FLONASE) 50 mcg/actuation nasal spray 1 spray into each nostril daily as needed.      loratadine (CLARITIN) 10 mg tablet Take 1 tablet (10 mg total) by mouth daily.      metoprolol succinate (TOPROL-XL) 50 MG 24 hr tablet Take 1 tablet (50 mg total) by mouth.      multivitamin with minerals tablet Take 1 tablet by mouth daily.      omeprazole (PRILOSEC) 40 MG capsule Take 1 capsule (40 mg total) by mouth daily. 90 capsule 0    ondansetron (ZOFRAN) 8 MG tablet Take 1 tablet (8 mg total) by mouth every eight (8) hours as needed for nausea. 30 tablet 2    sildenafiL, pulm.hypertension, (REVATIO) 20 mg tablet Take 1-5 tablets (20-100 mg total) by mouth daily as needed (for erectile function). 90 tablet 3    tamsulosin (FLOMAX) 0.4 mg capsule Take 1 capsule (0.4 mg total) by mouth daily. 90 capsule 0    traZODone (DESYREL) 50 MG tablet       valACYclovir (VALTREX) 500 MG tablet Take 1 tablet (500 mg total) by mouth daily for 90 doses. 90 tablet 0    venetoclax (VENCLEXTA) 100 mg tablet Take 4 tablets (400 mg total) by mouth daily. Take with a meal and water. Do not  chew, crush, or break tablets. 120 tablet 1     No current facility-administered medications for this visit.     Facility-Administered Medications Ordered in Other Visits   Medication Dose Route Frequency Provider Last Rate Last Admin    allopurinol (ZYLOPRIM) tablet 300 mg  300 mg Oral Once Tolley, Robyn J, AGNP        DTaP-hepatitis B recombinant-IPV (PEDIARIX) 10 mcg-25Lf-25 mcg-10Lf/0.5 mL injection             DTaP-hepatitis B recombinant-IPV (PEDIARIX) 10 mcg-25Lf-25 mcg-10Lf/0.5 mL injection             haemophilus B polysac-tetanus toxoid (ActHIB) 10 mcg/0.5 mL injection             influenza vaccine quad (FLUARIX, FLULAVAL, FLUZONE) (6 MOS & UP) 2020-21 ADS Med             pneumococcal conjugate (13-valent) (PREVNAR-13) 0.5 mL vaccine             pneumococcal conjugate (13-valent) (PREVNAR-13) 0.5 mL vaccine             varicella-zoster gE-AS01B (PF) (SHINGRIX) 50 mcg/0.5 mL injection                Adherence: no barriers identified        Assessment: Timothy Porter is a 76 y.o. male with AML who is starting treatment with Beat AML, S12, azacitidine/venetoclax with venetoclax randomized to 28 day duration. Appropriate to start treatment with C1D1 today, ANC 0.6. Patient confirmed understanding of plan below.     Plan:   1) AML  -Start B-AML S12 azacitidine/venetoclax today, C1D1   -Venetoclax 100 mg day 1, 200 mg day 2, 400 mg day 3 onwards  -Twice weekly labs/transfusions  -C1 marrow on 1/17, [name] on 1/24    2) TLS prophylaxis  -Continue allopurinol 300 mg PO daily    3) Infection prophylaxis  -Start levofloxacin 500 mg PO daily  -Continue valacyclovir 500 mg PO daily  -Plan to start voriconazole 200 mg PO BID no sooner than day 8 if ANC<0.5. Rx sent today to [facility] for benefits investigation. Patient aware not to start until instructed. Using voriconazole due to study.     4) CV  -Continue OFF off diltiazem  -Continue metoprolol succinate 50 mg once daily, can titrate as needed for BP/HR  -Follows with [facility]    5) Supportive care  -Start ondansetron 8 mg up to every 8 hours prn nausea/vomiting  -Change cholestyramine to only ONCE a day in the morning/afternoon and separate by 4 hours from venetoclax (which he will administer in the evening after supper)    F/u:  Future Appointments   Date Time Provider Department Center   11/22/2022 11:45 AM ONCINF CHAIR 21 [facility]   11/23/2022  2:30 PM ONCINF CHAIR 23 [facility]   11/24/2022  3:30 PM ONCINF CHAIR 26 [facility]   11/25/2022  8:30 AM ONCINF CHAIR 02 [facility]   11/26/2022 10:00 AM ONCINF CHAIR 06 [facility]   11/28/2022  3:00 PM ONCINF CHAIR 20 [facility]   11/29/2022 10:45 AM ADULT ONC LAB [facility]   11/29/2022 12:00 PM [name] [place], AGNP [facility]   11/29/2022  1:00 PM ONCINF CHAIR 25 [facility]   12/06/2022  8:15 AM ADULT ONC LAB [facility]   12/06/2022  9:00 AM Nichols, Angela Dawn, AGNP [facility]  12/06/2022 10:00 AM ONCHEM LEUKEMIA PHARMACIST [facility]   12/06/2022 11:30 AM ONCINF CHAIR 25 [facility]   12/13/2022  7:45 AM ADULT ONC LAB [facility]   12/13/2022  9:00 AM ONCINF CHAIR 20 [facility]   12/13/2022  9:00 AM Lenon Ahmadi, AGNP [facility]   12/20/2022 12:00 PM ADULT ONC LAB [facility]   12/20/2022  1:00 PM Doreatha Lew, MD [facility]   12/20/2022  2:30 PM ONCINF CHAIR 35 [facility]   12/21/2022  2:00 PM ONCINF CHAIR 46 [facility]   12/22/2022  2:00 PM ONCINF CHAIR 26 [facility]   12/23/2022  8:30 AM ONCINF CHAIR 02 [facility]   12/24/2022  8:30 AM ONCINF CHAIR 02 [facility]   12/25/2022  2:00 PM ONCINF CHAIR 13 [facility]   12/26/2022  2:00 PM ONCINF CHAIR 14 [facility]   02/13/2023  4:00 PM Ruthell Rummage, MD [facility]       I spent 45 minutes with Timothy Porter in direct patient care.     Ronnald Collum, PharmD, BCOP, CPP  Hematology/Oncology Clinical Pharmacist  Pager [phone]

## 2022-11-22 NOTE — Unmapped (Addendum)
It was great to meet you today!    We are going to start your treatment today. The first 4 days you will get labs and receive IV fluids in infusion for 2 hours. This is to protect your kidneys during the first part of treatment in addition to the allopurinol. I will give you a dose of allopurinol in infusion today with your treatment but please start your prescription tomorrow.    We are going to add an appointment for labs +/- transfusion on Saturday or Sunday over the coming weeks. We will see you in clinic each Wednesday and you will get labs and transfusion those days as well.     I have sent a prescription for zofran to your local pharmacy. You will take this if you have nausea at home. Caution it can cause constipation so please monitor your bowel movements.     You will have a bone marrow biopsy at the end of this cycle of treatment to evaluate your disease status.     Please don't hesitate to reach out should you have any questions or concerns.          RED ZONE Means: RED ZONE: Take action now!     You need to be seen right away  Symptoms are at a severe level of discomfort    Call 911 or go to your nearest  Hospital for help     - Bleeding that will not stop    - Hard to breathe    - New seizure - Chest pain  - Fall or passing out  -Thoughts of hurting    yourself or others      Call 911 if you are going into the RED ZONE                  YELLOW ZONE Means:     Please call with any new or worsening symptom(s), even if not on this list.  Call [phone]  After hours, weekends, and holidays - you will reach a long recording with specific instructions, If not in an emergency such as above, please listen closely all the way to the end and choose the option that relates to your need.   You can be seen by a provider the same day through our Same Day Acute Care for Patients with Cancer program.      YELLOW ZONE: Take action today     Symptoms are new or worsening  You are not within your goal range for:    - Pain    - Shortness of breath    - Bleeding (nose, urine, stool, wound)    - Feeling sick to your stomach and throwing up    - Mouth sores/pain in your mouth or throat    - Hard stool or very loose stools (increase in       ostomy output)    - No urine for 12 hours    - Feeding tube or other catheter/tube issue    - Redness or pain at previous IV or port/catheter site    - Depressed or anxiety   - Swelling (leg, arm, abdomen,     face, neck)  - Skin rash or skin changes  - Wound issues (redness, drainage,    re-opened)  - Confusion  - Vision changes  - Fever >100.4 F or chills  - Worsening cough with mucus that is    green, yellow, or bloody  - Pain or burning when  going to the    bathroom  - Home Infusion Pump Issue- call    [phone]         Call your healthcare provider if you are going into the YELLOW ZONE     GREEN ZONE Means:  Your symptoms are under controls  Continue to take your medicine as ordered  Keep all visits to the provider GREEN ZONE: You are in control  No increase or worsening symptoms  Able to take your medicine  Able to drink and eat    - DO NOT use MyChart messages to report red or yellow symptoms. Allow up to 3    business days for a reply.  -MyChart is for non-urgent medication refills, scheduling requests, or other general questions.         [id number] Rev. 05/26/2022  Approved by Oncology Patient Education Committee

## 2022-11-22 NOTE — Unmapped (Signed)
[facility] Leukemia Clinic Visit Note     Patient Name: Timothy Porter  Patient Age: 76 y.o.  Encounter Date: 11/22/2022    Primary Care Provider:  Pcp, None Per Patient    Referring Physician:  Pcp, None Per Patient  [address] 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 Likely treatment related with prior exposure to melphalan also lenalidomide.     Today is cycle 1 day 1 BEAT AML clinical trial. Will receive azacitidine 75mg /m2 subcutaneous x 7 days and randomized to 28 days of venetoclax. Will start with ramp up to ven 400mg  daily. If posaconazole prophylaxis needed will dose reduce venetoclax accordingly. Start allopurinol 300mg  daily. During the first cycle of treatment Timothy Porter will get labs 2 times per week with possible transfusion support. Repeat bone marrow biopsy scheduled for the end of this cycle of treatment. He will be seen in clinic weekly for this cycle.     Free Kappa Chain MM: Treatment hx: RVD x4 (02/19/19-) - ASCT, mel 140 mg/m2 (05/29/19; VGPR) - Len maintenance, [id number] (10/22/19-08/2022; CR, MRD-neg).   - Continue to follow with myeloma team. Patient is scheduled to see [name] on 11/14/22    GERD: Continue omeprazole     Alternating constipation and diarrhea: Continue home cholestyramine though reduce to one time daily. Advised to monitor for constipation during the first 7 days of treatment due to zofran administration.     Psychosocial support: He demonstrates good coping and has good support to proceed with the management above  - Counseling given   - Consider ref to comprehensive cancer support program if needed    Patient-centered care/Shared decision-making:   We discussed the plan above at length. The patient actively contributed to the conversation. Specifically, his most important outcomes are:   - Living longer   - Maintaining his overall functional activity     Supportive Care Needs: We recommend based on the patient???s underlying diagnosis and treatment history the following supportive care:    1. Antimicrobial prophylaxis: AML (not in remission): Bacterial: Levofloxacin 500mg  PO daily (absolute neutrophils >/= 0.5 until absolute neutrophils >/= 0.5);   Fungal: AML fungal: Posaconazole 300mg  PO daily (absolute neutrophils >/= 0.5 until absolute neutrophils >/= 0.5) ;   Viral: Valacyclovir 500mg  PO daily (continuous). If posaconazole is to be used, venetoclax should be reduced to 100 mg a day.     2. Blood product support:  Leukoreduced blood products are required.  Irradiated blood products are preferred, but in case of urgent transfusion needs non-irradiated blood products may be used. In the outpatient setting our recommendations are as below:  - 1 unit pRBC for Hb < 8 or Hb < 9 and symptomatic  - 1 unit of platelets for platelets < 20    Coordination of care:   2 times weekly labs/transfusion support  RTC in 1 weeks  BMBx scheduled on 12/13/21     I personally spent 45 minutes face-to-face and non-face-to-face in the care  of this patient, which includes all pre, intra, and post visit time on the date of service.  All documented time was specific to the E/M visit and does not include any procedures that may have been performed.    Lavonda Jumbo, AGPCNP-BC  Leukemia Expanded Access Provider  [facility]    [name], MD was available     Nurse Navigator (non-clinical trial patients): Elicia Lamp, RN        Tel. [phone]       Fax. [phone]  Toll-free appointments: [phone]  Scheduling assistance: [phone]  After hours/weekends: [phone] (ask for adult hematology/oncology on-call)    Interval events:  Timothy Porter presents to clinic today feeling generally well. He has ongoing fatigue. He still tries to walk 20 minutes per day around his home. There are some hills that make this a challenge. He reports decreased appetite, denies nausea or vomiting. States his bowels are generally loose but he is constipated right now and would like to keep it that way. Bowel consistency is that of peanut butter and he takes cholestyramine 2 times daily. He notes a small dark scab on his toe that he requests evaluation on today. He is ready to start treatment for AML and states he lives close so is flexible with appointments. Otherwise, he denies fever, chills, night sweats, cough, SOB, new infections, swelling or enlarged lymph nodes.      History of Present Illness:   Timothy Porter is a 76 y.o. 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    The patient reports a history of fatigue ever since he was diagnosed with multiple myeloma. This worsened within the last several months. He was previously able to walk approximately 40 minutes and this has since decreased to 6 minutes. He was found to have progressive pancytopenia during this time. His lenalidomide was discontinued approximately 5 weeks ago. Bone marrow biopsy on 08/22/22 was notable for slightly hypocellular bone marrow (20%) with trilineage hematopoiesis without morphologic or overt immunophenotypic evidence of plasma cell neoplasm or leukemia. However, a MECOM rearrangement was subsequent identified and confirmed by cytogenetic analyses, giving concern for a high-grade myeloid neoplasm. Repeat bone marrow biopsy on 10/26/22 demonstrated a hypercellular bone marrow (50%) involved by acute myeloid leukemia with MECOM rearrangement (23% blasts by manual aspirate differential and 10-20% CD34-positive blasts by immunohistochemistry) with mild to focally moderate marrow fibrosis (MF-1 to 2) and less than 5% plasma cells by manual aspirate differential count.    His social history is notable for no history or tobacco use and rare alcohol use (1 glass of champagne a month). 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 He lives with his wife who is a Education officer, community. His son is an Designer, industrial/product.     No significant family history of cancer.     Oncology History is as below:   Oncology History Overview Note   Referring/Local Oncologist:  [name]     Diagnosis:   Diagnosis   Date Value Ref Range Status   10/26/2022   Final    Bone marrow, left iliac, aspiration and biopsy  -  Hypercellular bone marrow (50%) involved by acute myeloid leukemia with MECOM rearrangement (23% blasts by manual aspirate differential and 10-20% CD34-positive blasts by immunohistochemistry)   -  Mild to focally moderate marrow fibrosis (MF-1 to 2)   -  Less  than 5% plasma cells by manual aspirate differential count (see Comment)  -  See linked reports for associated Ancillary Studies.      This electronic signature is attestation that the pathologist personally reviewed the submitted material(s) and the final diagnosis reflects that evaluation.          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 11/04/18)  A Randomized Study of Daratumumab Plus Lenalidomide Versus Lenalidomide Alone as Maintenance Treatment in Patients with Newly Diagnosed Multiple Myeloma Who Are Minimal Residual Disease Positive After Frontline Autologous Stem Cell Transplant.     History of auto stem cell transplant (CMS-HCC)   08/05/2019 Initial Diagnosis    History of auto stem cell transplant (CMS-HCC)     10/22/2019 - 06/27/2022 Chemotherapy    STUDY [id number] IRB# [id number] LENALIDOMIDE (v. 11/04/18)  A Randomized Study of Daratumumab Plus Lenalidomide Versus Lenalidomide Alone as Maintenance Treatment in Patients with Newly Diagnosed Multiple Myeloma Who Are Minimal Residual Disease Positive After Frontline Autologous Stem Cell Transplant.     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)         Review of Systems:   ROS reviewed and negative except as noted in H and P     Allergies:  No Known Allergies    Medications:     Current Outpatient Medications:     allopurinol (ZYLOPRIM) 300 MG tablet, Take 1 tablet (300 mg total) by mouth daily., Disp: 30 tablet, Rfl: 2    calcium carbonate (TUMS) 200 mg calcium (500 mg) chewable tablet, Chew 2 tablets (400 mg of elem calcium total) Three (3) times a day., Disp: , Rfl:     cholecalciferol, vitamin D3 25 mcg, 1,000 units,, 1,000 unit (25 mcg) tablet, Take 1 tablet (25 mcg total) by mouth., Disp: , Rfl:     cholestyramine (QUESTRAN) 4 gram packet, Take 1 packet by mouth in the morning. Mix 4 g (1 packet) into 2-3 ounces of fluid and take twice a day.., Disp: 60 each, Rfl: 2    cholestyramine (QUESTRAN) 4 gram packet, TAKE 1 PACKET BY MOUTH EVERY MORNING. MIX 4 GRAMS INTO 2 TO 3 OZ OF FLUID AND TAKE TWICE DAILY, Disp: 60 each, Rfl: 2    cyanocobalamin, vitamin B-12, 2,000 mcg Tab, Take 2,000 mcg by mouth in the morning., Disp: 90 tablet, Rfl: 3    fluticasone propionate (FLONASE) 50 mcg/actuation nasal spray, 1 spray into each nostril daily as needed., Disp: , Rfl:     loratadine (CLARITIN) 10 mg tablet, Take 1 tablet (10 mg total) by mouth daily., Disp: , Rfl:     metoprolol succinate (TOPROL-XL) 50 MG 24 hr tablet, Take 1 tablet (50 mg total) by mouth., Disp: , Rfl:     multivitamin with minerals  tablet, Take 1 tablet by mouth daily., Disp: , Rfl:     omeprazole (PRILOSEC) 40 MG capsule, Take 1 capsule (40 mg total) by mouth daily., Disp: 90 capsule, Rfl: 0    sildenafiL, pulm.hypertension, (REVATIO) 20 mg tablet, Take 1-5 tablets (20-100 mg total) by mouth daily as needed (for erectile function)., Disp: 90 tablet, Rfl: 3    venetoclax (VENCLEXTA) 100 mg tablet, Take 4 tablets (400 mg total) by mouth daily. Take with a meal and water. Do not chew, crush, or break tablets., Disp: 120 tablet, Rfl: 1    levoFLOXacin (LEVAQUIN) 500 MG tablet, Take 1 tablet (500 mg total) by mouth daily., Disp: 30 tablet, Rfl: 1    ondansetron (ZOFRAN) 8 MG tablet, Take 1 tablet (8 mg total) by mouth every eight (8) hours as needed for nausea., Disp: 30 tablet, Rfl: 2    tamsulosin (FLOMAX) 0.4 mg capsule, Take 1 capsule (0.4 mg total) by mouth daily., Disp: 90 capsule, Rfl: 0    voriconazole (VFEND) 200 MG tablet, Take 1 tablet (200 mg total) by mouth two (2) times a day., Disp: 60 tablet, Rfl: 5  No current facility-administered medications for this visit.    Facility-Administered Medications Ordered in Other Visits:     DTaP-hepatitis B recombinant-IPV (PEDIARIX) 10 mcg-25Lf-25 mcg-10Lf/0.5 mL injection, , , ,     DTaP-hepatitis B recombinant-IPV (PEDIARIX) 10 mcg-25Lf-25 mcg-10Lf/0.5 mL injection, , , ,     haemophilus B polysac-tetanus toxoid (ActHIB) 10 mcg/0.5 mL injection, , , ,     influenza vaccine quad (FLUARIX, FLULAVAL, FLUZONE) (6 MOS & UP) 2020-21 ADS Med, , , ,     pneumococcal conjugate (13-valent) (PREVNAR-13) 0.5 mL vaccine, , , ,     pneumococcal conjugate (13-valent) (PREVNAR-13) 0.5 mL vaccine, , , ,     varicella-zoster gE-AS01B (PF) (SHINGRIX) 50 mcg/0.5 mL injection, , , ,     Medical History:  Past Medical History:   Diagnosis Date    Benign prostatic hyperplasia with lower urinary tract symptoms 11/27/2018    BPH (benign prostatic hyperplasia)     Diarrhea     Fatigue     Fractures     left arm    Glucose intolerance     May 2019: HbA1c 6.2.    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  Planning to build more apartments.       Family History:  Family History   Problem Relation Age of Onset    Dementia Father     No Known Problems Other        Objective:   BP 155/77  - Pulse 91  - Temp 36.4 ??C (97.6 ??F) (Temporal)  - Resp 16  - Ht 200.7 cm (6' 7.02)  - Wt (!) 122.6 kg (270 lb 4.5 oz)  - SpO2 99%  - BMI 30.44 kg/m??     Physical Exam:    General: Resting in no apparent distress, sitting upright, comfortable, no acute distress. Presents with wife.   Heart:  Regular rate and rhythm. S1, S2. No murmurs, gallops or rubs.  Lungs:  Breathing  is unlabored and patient is speaking full sentences with ease.  No stridor.  Auscultation of lung fields reveals normal air movement without rales, rhonchi or crackles.  GI:  No distention or pain on palpation. No palpable hepatomegaly or splenomegaly.  No palpable masses.  Skin:  No rashes or purpura. Mild petechia noted on bilateral lower extremities. Dark scab area to right middle toe without erythema or drainage. No areas of skin breakdown.  Musculoskeletal:  No grossly-evident joint effusions or deformities.  Range of motion about the shoulder, elbow, hips and knees is grossly normal.    Psychiatric:  Alert and oriented to person, place, time and situation.  Range of affect is appropriate.    Neurologic:  Grossly normal motor and sensory. Extremities:  Appear well-perfused. No clubbing, edema or cyanosis.    Test Results:  No results found for this or any previous visit (from the past 24 hour(s)).

## 2022-11-23 ENCOUNTER — Ambulatory Visit: Admit: 2022-11-23 | Discharge: 2022-11-24 | Payer: MEDICARE

## 2022-11-23 DIAGNOSIS — C9200 Acute myeloblastic leukemia, not having achieved remission: Principal | ICD-10-CM

## 2022-11-23 LAB — CREATININE
CREATININE: 1.05 mg/dL
EGFR CKD-EPI (2021) MALE: 74 mL/min/{1.73_m2} (ref >=60)

## 2022-11-23 LAB — CALCIUM: CALCIUM: 8.4 mg/dL — ABNORMAL LOW (ref 8.7–10.4)

## 2022-11-23 LAB — URIC ACID: URIC ACID: 2.7 mg/dL — ABNORMAL LOW

## 2022-11-23 LAB — PHOSPHORUS: PHOSPHORUS: 2.7 mg/dL (ref 2.4–5.1)

## 2022-11-23 LAB — POTASSIUM: POTASSIUM: 4 mmol/L (ref 3.4–4.8)

## 2022-11-23 MED ADMIN — ondansetron (ZOFRAN) tablet 8 mg: 8 mg | ORAL | @ 21:00:00 | Stop: 2022-11-23

## 2022-11-23 MED ADMIN — sodium chloride 0.9% (NS) bolus 500 mL: 500 mL | INTRAVENOUS | @ 20:00:00 | Stop: 2022-11-23

## 2022-11-23 MED ADMIN — azaCITIDine (VIDAZA) syringe: 75 mg/m2 | SUBCUTANEOUS | @ 21:00:00 | Stop: 2022-11-23

## 2022-11-23 NOTE — Unmapped (Signed)
RED ZONE Means: RED ZONE: Take action now!     You need to be seen right away  Symptoms are at a severe level of discomfort    Call 911 or go to your nearest  Hospital for help     - Bleeding that will not stop    - Hard to breathe    - New seizure - Chest pain  - Fall or passing out  -Thoughts of hurting    yourself or others      Call 911 if you are going into the RED ZONE                  YELLOW ZONE Means:     Please call with any new or worsening symptom(s), even if not on this list.  Call [phone]  After hours, weekends, and holidays - you will reach a long recording with specific instructions, If not in an emergency such as above, please listen closely all the way to the end and choose the option that relates to your need.   You can be seen by a provider the same day through our Same Day Acute Care for Patients with Cancer program.      YELLOW ZONE: Take action today     Symptoms are new or worsening  You are not within your goal range for:    - Pain    - Shortness of breath    - Bleeding (nose, urine, stool, wound)    - Feeling sick to your stomach and throwing up    - Mouth sores/pain in your mouth or throat    - Hard stool or very loose stools (increase in       ostomy output)    - No urine for 12 hours    - Feeding tube or other catheter/tube issue    - Redness or pain at previous IV or port/catheter site    - Depressed or anxiety   - Swelling (leg, arm, abdomen,     face, neck)  - Skin rash or skin changes  - Wound issues (redness, drainage,    re-opened)  - Confusion  - Vision changes  - Fever >100.4 F or chills  - Worsening cough with mucus that is    green, yellow, or bloody  - Pain or burning when going to the    bathroom  - Home Infusion Pump Issue- call    [phone]         Call your healthcare provider if you are going into the YELLOW ZONE     GREEN ZONE Means:  Your symptoms are under controls  Continue to take your medicine as ordered  Keep all visits to the provider GREEN ZONE: You are in control  No increase or worsening symptoms  Able to take your medicine  Able to drink and eat    - DO NOT use MyChart messages to report red or yellow symptoms. Allow up to 3    business days for a reply.  -MyChart is for non-urgent medication refills, scheduling requests, or other general questions.         [id number] Rev. 05/26/2022  Approved by Oncology Patient Education Committee        Lab on 11/22/2022   Component Date Value Ref Range Status    ABO Grouping 11/22/2022 A POS   Final    Antibody Screen 11/22/2022 NEG   Final    Sodium 11/22/2022 139  135 - 145 mmol/L Final  Potassium 11/22/2022 3.9  3.4 - 4.8 mmol/L Final    Chloride 11/22/2022 108 (H)  98 - 107 mmol/L Final    CO2 11/22/2022 24.0  20.0 - 31.0 mmol/L Final    Anion Gap 11/22/2022 7  5 - 14 mmol/L Final    BUN 11/22/2022 22  9 - 23 mg/dL Final    Creatinine 16/08/9603 0.88  0.73 - 1.18 mg/dL Final    BUN/Creatinine Ratio 11/22/2022 25   Final    eGFR CKD-EPI (2021) Male 11/22/2022 90  >=60 mL/min/1.79m2 Final    eGFR calculated with CKD-EPI 2021 equation in accordance with [employer] and [employer] of Nephrology Task Force recommendations.    Glucose 11/22/2022 126  70 - 179 mg/dL Final    Calcium 54/07/8118 8.2 (L)  8.7 - 10.4 mg/dL Final    Albumin 14/78/2956 3.9  3.4 - 5.0 g/dL Final    Total Protein 11/22/2022 6.9  5.7 - 8.2 g/dL Final    Total Bilirubin 11/22/2022 1.0  0.3 - 1.2 mg/dL Final    AST 21/30/8657 23  <=34 U/L Final    ALT 11/22/2022 23  10 - 49 U/L Final    Alkaline Phosphatase 11/22/2022 112  46 - 116 U/L Final    Magnesium 11/22/2022 1.8  1.6 - 2.6 mg/dL Final    Phosphorus 84/69/6295 2.6  2.4 - 5.1 mg/dL Final    LDH 28/41/3244 230  120 - 246 U/L Final    Uric Acid 11/22/2022 3.1 (L)  3.7 - 9.2 mg/dL Final    WBC 11/29/7251 2.2 (L)  3.6 - 11.2 10*9/L Final    RBC 11/22/2022 2.68 (L)  4.26 - 5.60 10*12/L Final    HGB 11/22/2022 8.5 (L)  12.9 - 16.5 g/dL Final    HCT 66/44/0347 24.1 (L)  39.0 - 48.0 % Final    MCV 11/22/2022 89.9  77.6 - 95.7 fL Final    MCH 11/22/2022 31.8  25.9 - 32.4 pg Final    MCHC 11/22/2022 35.3  32.0 - 36.0 g/dL Final    RDW 42/59/5638 17.8 (H)  12.2 - 15.2 % Final    MPV 11/22/2022 7.9  6.8 - 10.7 fL Final    Platelet 11/22/2022 21 (L)  150 - 450 10*9/L Final    Neutrophils % 11/22/2022 27.4  % Final    Lymphocytes % 11/22/2022 50.7  % Final    Monocytes % 11/22/2022 21.1  % Final    Eosinophils % 11/22/2022 0.0  % Final    Basophils % 11/22/2022 0.8  % Final    Absolute Neutrophils 11/22/2022 0.6 (L)  1.8 - 7.8 10*9/L Final    Absolute Lymphocytes 11/22/2022 1.1  1.1 - 3.6 10*9/L Final    Absolute Monocytes 11/22/2022 0.5  0.3 - 0.8 10*9/L Final    Absolute Eosinophils 11/22/2022 0.0  0.0 - 0.5 10*9/L Final    Absolute Basophils 11/22/2022 0.0  0.0 - 0.1 10*9/L Final    Anisocytosis 11/22/2022 Slight (A)  Not Present Final

## 2022-11-24 ENCOUNTER — Ambulatory Visit: Admit: 2022-11-24 | Discharge: 2022-11-25 | Payer: MEDICARE

## 2022-11-24 LAB — CALCIUM: CALCIUM: 8.7 mg/dL (ref 8.7–10.4)

## 2022-11-24 LAB — POTASSIUM: POTASSIUM: 3.7 mmol/L (ref 3.4–4.8)

## 2022-11-24 LAB — PHOSPHORUS: PHOSPHORUS: 2.8 mg/dL (ref 2.4–5.1)

## 2022-11-24 LAB — URIC ACID: URIC ACID: 2.4 mg/dL — ABNORMAL LOW

## 2022-11-24 LAB — CREATININE
CREATININE: 1.07 mg/dL
EGFR CKD-EPI (2021) MALE: 72 mL/min/{1.73_m2} (ref >=60)

## 2022-11-24 MED ADMIN — ondansetron (ZOFRAN) tablet 8 mg: 8 mg | ORAL | @ 22:00:00 | Stop: 2022-11-24

## 2022-11-24 MED ADMIN — sodium chloride 0.9% (NS) bolus 500 mL: 500 mL | INTRAVENOUS | @ 21:00:00 | Stop: 2022-11-24

## 2022-11-24 MED ADMIN — azaCITIDine (VIDAZA) syringe: 75 mg/m2 | SUBCUTANEOUS | @ 22:00:00 | Stop: 2022-11-24

## 2022-11-24 NOTE — Unmapped (Signed)
1725: pt tolerated infusion and injection without difficulty. Refuses to maintain piv which was flushed then removed, apply 2x2 w coban. AVS declined

## 2022-11-24 NOTE — Unmapped (Signed)
Hospital Outpatient Visit on 11/23/2022   Component Date Value Ref Range Status    Potassium 11/23/2022 4.0  3.4 - 4.8 mmol/L Final    Creatinine 11/23/2022 1.05  0.73 - 1.18 mg/dL Final    eGFR CKD-EPI (2021) Male 11/23/2022 74  >=60 mL/min/1.8m2 Final    eGFR calculated with CKD-EPI 2021 equation in accordance with [employer] and [employer] of Nephrology Task Force recommendations.    Calcium 11/23/2022 8.4 (L)  8.7 - 10.4 mg/dL Final    Phosphorus 16/08/9603 2.7  2.4 - 5.1 mg/dL Final    Uric Acid 54/07/8118 2.7 (L)  3.7 - 9.2 mg/dL Final

## 2022-11-25 ENCOUNTER — Ambulatory Visit: Admit: 2022-11-25 | Discharge: 2022-11-26 | Payer: MEDICARE

## 2022-11-25 LAB — CBC W/ AUTO DIFF
BASOPHILS ABSOLUTE COUNT: 0 10*9/L (ref 0.0–0.1)
BASOPHILS RELATIVE PERCENT: 0.7 %
EOSINOPHILS ABSOLUTE COUNT: 0 10*9/L (ref 0.0–0.5)
EOSINOPHILS RELATIVE PERCENT: 0 %
HEMATOCRIT: 20.4 % — ABNORMAL LOW (ref 39.0–48.0)
HEMOGLOBIN: 7 g/dL — ABNORMAL LOW (ref 12.9–16.5)
LYMPHOCYTES ABSOLUTE COUNT: 0.7 10*9/L — ABNORMAL LOW (ref 1.1–3.6)
LYMPHOCYTES RELATIVE PERCENT: 41 %
MEAN CORPUSCULAR HEMOGLOBIN CONC: 34.3 g/dL (ref 32.0–36.0)
MEAN CORPUSCULAR HEMOGLOBIN: 31.3 pg (ref 25.9–32.4)
MEAN CORPUSCULAR VOLUME: 91.2 fL (ref 77.6–95.7)
MEAN PLATELET VOLUME: 7.7 fL (ref 6.8–10.7)
MONOCYTES ABSOLUTE COUNT: 0.1 10*9/L — ABNORMAL LOW (ref 0.3–0.8)
MONOCYTES RELATIVE PERCENT: 7.3 %
NEUTROPHILS ABSOLUTE COUNT: 0.9 10*9/L — ABNORMAL LOW (ref 1.8–7.8)
NEUTROPHILS RELATIVE PERCENT: 51 %
PLATELET COUNT: 17 10*9/L — ABNORMAL LOW (ref 150–450)
RED BLOOD CELL COUNT: 2.23 10*12/L — ABNORMAL LOW (ref 4.26–5.60)
RED CELL DISTRIBUTION WIDTH: 18.1 % — ABNORMAL HIGH (ref 12.2–15.2)
WBC ADJUSTED: 1.8 10*9/L — ABNORMAL LOW (ref 3.6–11.2)

## 2022-11-25 LAB — COMPREHENSIVE METABOLIC PANEL
ALBUMIN: 3.4 g/dL (ref 3.4–5.0)
ALKALINE PHOSPHATASE: 103 U/L (ref 46–116)
ALT (SGPT): 24 U/L (ref 10–49)
ANION GAP: 5 mmol/L (ref 5–14)
AST (SGOT): 21 U/L (ref <=34)
BILIRUBIN TOTAL: 0.8 mg/dL (ref 0.3–1.2)
BLOOD UREA NITROGEN: 20 mg/dL (ref 9–23)
BUN / CREAT RATIO: 19
CALCIUM: 8.5 mg/dL — ABNORMAL LOW (ref 8.7–10.4)
CHLORIDE: 108 mmol/L — ABNORMAL HIGH (ref 98–107)
CO2: 25 mmol/L (ref 20.0–31.0)
CREATININE: 1.05 mg/dL
EGFR CKD-EPI (2021) MALE: 74 mL/min/{1.73_m2} (ref >=60)
GLUCOSE RANDOM: 156 mg/dL (ref 70–179)
POTASSIUM: 4 mmol/L (ref 3.4–4.8)
PROTEIN TOTAL: 6.3 g/dL (ref 5.7–8.2)
SODIUM: 138 mmol/L (ref 135–145)

## 2022-11-25 LAB — MAGNESIUM: MAGNESIUM: 1.8 mg/dL (ref 1.6–2.6)

## 2022-11-25 LAB — PHOSPHORUS: PHOSPHORUS: 2.7 mg/dL (ref 2.4–5.1)

## 2022-11-25 LAB — URIC ACID: URIC ACID: 2.3 mg/dL — ABNORMAL LOW

## 2022-11-25 LAB — LACTATE DEHYDROGENASE: LACTATE DEHYDROGENASE: 187 U/L (ref 120–246)

## 2022-11-25 MED ADMIN — azaCITIDine (VIDAZA) syringe: 75 mg/m2 | SUBCUTANEOUS | @ 15:00:00 | Stop: 2022-11-25

## 2022-11-25 MED ADMIN — sodium chloride 0.9% (NS) bolus 500 mL: 500 mL | INTRAVENOUS | @ 14:00:00 | Stop: 2022-11-25

## 2022-11-25 MED ADMIN — acetaminophen (TYLENOL) tablet 650 mg: 650 mg | ORAL | @ 15:00:00 | Stop: 2022-11-25

## 2022-11-25 NOTE — Unmapped (Signed)
1553 Pt arrived for scheduled infusion. Patient tolerated injection. Gave report to [facility], RN and informed patient of change of shift.

## 2022-11-25 NOTE — Unmapped (Signed)
Hospital Outpatient Visit on 11/25/2022   Component Date Value Ref Range Status    Magnesium 11/25/2022 1.8  1.6 - 2.6 mg/dL Final    Phosphorus 45/40/9811 2.7  2.4 - 5.1 mg/dL Final    LDH 91/47/8295 187  120 - 246 U/L Final    Uric Acid 11/25/2022 2.3 (L)  3.7 - 9.2 mg/dL Final    Sodium 62/13/0865 138  135 - 145 mmol/L Final    Potassium 11/25/2022 4.0  3.4 - 4.8 mmol/L Final    Chloride 11/25/2022 108 (H)  98 - 107 mmol/L Final    CO2 11/25/2022 25.0  20.0 - 31.0 mmol/L Final    Anion Gap 11/25/2022 5  5 - 14 mmol/L Final    BUN 11/25/2022 20  9 - 23 mg/dL Final    Creatinine 78/46/9629 1.05  0.73 - 1.18 mg/dL Final    BUN/Creatinine Ratio 11/25/2022 19   Final    eGFR CKD-EPI (2021) Male 11/25/2022 74  >=60 mL/min/1.51m2 Final    eGFR calculated with CKD-EPI 2021 equation in accordance with [employer] and [employer] of Nephrology Task Force recommendations.    Glucose 11/25/2022 156  70 - 179 mg/dL Final    Calcium 52/84/1324 8.5 (L)  8.7 - 10.4 mg/dL Final    Albumin 40/08/2724 3.4  3.4 - 5.0 g/dL Final    Total Protein 11/25/2022 6.3  5.7 - 8.2 g/dL Final    Total Bilirubin 11/25/2022 0.8  0.3 - 1.2 mg/dL Final    AST 36/64/4034 21  <=34 U/L Final    ALT 11/25/2022 24  10 - 49 U/L Final    Alkaline Phosphatase 11/25/2022 103  46 - 116 U/L Final    WBC 11/25/2022 1.8 (L)  3.6 - 11.2 10*9/L Final    RBC 11/25/2022 2.23 (L)  4.26 - 5.60 10*12/L Final    HGB 11/25/2022 7.0 (L)  12.9 - 16.5 g/dL Final    HCT 74/25/9563 20.4 (L)  39.0 - 48.0 % Final    MCV 11/25/2022 91.2  77.6 - 95.7 fL Final    MCH 11/25/2022 31.3  25.9 - 32.4 pg Final    MCHC 11/25/2022 34.3  32.0 - 36.0 g/dL Final    RDW 87/56/4332 18.1 (H)  12.2 - 15.2 % Final    MPV 11/25/2022 7.7  6.8 - 10.7 fL Final    Platelet 11/25/2022 17 (L)  150 - 450 10*9/L Final    Neutrophils % 11/25/2022 51.0  % Final    Lymphocytes % 11/25/2022 41.0  % Final    Monocytes % 11/25/2022 7.3  % Final    Eosinophils % 11/25/2022 0.0  % Final Basophils % 11/25/2022 0.7  % Final    Absolute Neutrophils 11/25/2022 0.9 (L)  1.8 - 7.8 10*9/L Final    Absolute Lymphocytes 11/25/2022 0.7 (L)  1.1 - 3.6 10*9/L Final    Absolute Monocytes 11/25/2022 0.1 (L)  0.3 - 0.8 10*9/L Final    Absolute Eosinophils 11/25/2022 0.0  0.0 - 0.5 10*9/L Final    Absolute Basophils 11/25/2022 0.0  0.0 - 0.1 10*9/L Final    Anisocytosis 11/25/2022 Slight (A)  Not Present Final    Crossmatch 11/25/2022 Compatible   Final    Unit Blood Type 11/25/2022 A Pos   Final    ISBT Number 11/25/2022 6200   Final    Unit # 11/25/2022 [id number]   Final    Status 11/25/2022 Issued   Final    Spec Expiration  11/25/2022 [id number]   Final    Product ID 11/25/2022 Red Blood Cells   Final    PRODUCT CODE 11/25/2022 [id number]   Final    Crossmatch 11/25/2022 Compatible   Final    Unit Blood Type 11/25/2022 A Pos   Final    ISBT Number 11/25/2022 6200   Final    Unit # 11/25/2022 [id number]   Final    Status 11/25/2022 Issued   Final    Spec Expiration 11/25/2022 [id number]   Final    Product ID 11/25/2022 Red Blood Cells   Final    PRODUCT CODE 11/25/2022 [id number]   Final

## 2022-11-25 NOTE — Unmapped (Signed)
Hospital Outpatient Visit on 11/24/2022   Component Date Value Ref Range Status    Potassium 11/24/2022 3.7  3.4 - 4.8 mmol/L Final    Creatinine 11/24/2022 1.07  0.73 - 1.18 mg/dL Final    eGFR CKD-EPI (2021) Male 11/24/2022 72  >=60 mL/min/1.15m2 Final    eGFR calculated with CKD-EPI 2021 equation in accordance with [employer] and [employer] of Nephrology Task Force recommendations.    Calcium 11/24/2022 8.7  8.7 - 10.4 mg/dL Final    Phosphorus 57/84/6962 2.8  2.4 - 5.1 mg/dL Final    Uric Acid 95/28/4132 2.4 (L)  3.7 - 9.2 mg/dL Final

## 2022-11-25 NOTE — Unmapped (Signed)
Patient arrived to Oncology Infusion for C4D1 Injection & IVF. IVF & SubQ injection given w/o complication. Patient hbg resulted at 7. Two units of Prbcs given per therapy plan. Blood product consent done 10/26/22. Patient completed transfusion with no issues. Patient was discharged ambulatory with no additional needs, AVS declined.

## 2022-11-25 NOTE — Unmapped (Signed)
Assumed patient care from Ruben Gottron., RN at [phone]. Pt in NAD, no complaints voiced, aware of treatment plan with no questions.  The rest of IV bolus completed. PIV flushed, discontinued. Pt discharged from clinic in NAD, in stable condition.

## 2022-11-26 ENCOUNTER — Ambulatory Visit: Admit: 2022-11-26 | Discharge: 2022-11-27 | Payer: MEDICARE

## 2022-11-26 MED ADMIN — azaCITIDine (VIDAZA) syringe: 75 mg/m2 | SUBCUTANEOUS | @ 13:00:00 | Stop: 2022-11-26

## 2022-11-26 MED ADMIN — ondansetron (ZOFRAN) tablet 8 mg: 8 mg | ORAL | @ 13:00:00 | Stop: 2022-11-26

## 2022-11-26 NOTE — Unmapped (Signed)
Patient arrived at 0805 for scheduled Vidaza. BP slightly elevated (170/94) pt denies any headaches, dizziness, blurred vision or cognitive changes, encouraged pt to re-check BP when at home, pt verbalized understanding and agrees w/ plan. Patient tolerated injections well and discharged in stable condition.

## 2022-11-26 NOTE — Unmapped (Signed)
Patient had labs yesterday, 12/30 and received prbc.

## 2022-11-27 DIAGNOSIS — K219 Gastro-esophageal reflux disease without esophagitis: Principal | ICD-10-CM

## 2022-11-27 DIAGNOSIS — C9000 Multiple myeloma not having achieved remission: Principal | ICD-10-CM

## 2022-11-27 MED ORDER — OMEPRAZOLE 40 MG CAPSULE,DELAYED RELEASE
ORAL_CAPSULE | Freq: Every day | ORAL | 0 refills | 90 days
Start: 2022-11-27 — End: ?

## 2022-11-28 ENCOUNTER — Ambulatory Visit: Admit: 2022-11-28 | Discharge: 2022-11-29 | Payer: MEDICARE

## 2022-11-28 MED ADMIN — azaCITIDine (VIDAZA) syringe: 75 mg/m2 | SUBCUTANEOUS | @ 21:00:00 | Stop: 2022-11-28

## 2022-11-28 MED ADMIN — ondansetron (ZOFRAN) tablet 8 mg: 8 mg | ORAL | @ 21:00:00 | Stop: 2022-11-28

## 2022-11-29 ENCOUNTER — Ambulatory Visit: Admit: 2022-11-29 | Discharge: 2022-11-30 | Payer: MEDICARE

## 2022-11-29 ENCOUNTER — Other Ambulatory Visit: Admit: 2022-11-29 | Discharge: 2022-11-30 | Payer: MEDICARE

## 2022-11-29 ENCOUNTER — Encounter: Admit: 2022-11-29 | Discharge: 2022-11-30 | Payer: MEDICARE | Attending: Hematology | Primary: Hematology

## 2022-11-29 DIAGNOSIS — C9200 Acute myeloblastic leukemia, not having achieved remission: Principal | ICD-10-CM

## 2022-11-29 LAB — CBC W/ AUTO DIFF
HEMATOCRIT: 26.4 % — ABNORMAL LOW (ref 39.0–48.0)
HEMOGLOBIN: 9.4 g/dL — ABNORMAL LOW (ref 12.9–16.5)
MEAN CORPUSCULAR HEMOGLOBIN CONC: 35.5 g/dL (ref 32.0–36.0)
MEAN CORPUSCULAR HEMOGLOBIN: 31.7 pg (ref 25.9–32.4)
MEAN CORPUSCULAR VOLUME: 89.2 fL (ref 77.6–95.7)
MEAN PLATELET VOLUME: 8 fL (ref 6.8–10.7)
PLATELET COUNT: 17 10*9/L — ABNORMAL LOW (ref 150–450)
RED BLOOD CELL COUNT: 2.96 10*12/L — ABNORMAL LOW (ref 4.26–5.60)
RED CELL DISTRIBUTION WIDTH: 16.8 % — ABNORMAL HIGH (ref 12.2–15.2)
WBC ADJUSTED: 2.2 10*9/L — ABNORMAL LOW (ref 3.6–11.2)

## 2022-11-29 LAB — MANUAL DIFFERENTIAL
BASOPHILS - ABS (DIFF): 0 10*9/L (ref 0.0–0.1)
BASOPHILS - REL (DIFF): 0 %
EOSINOPHILS - ABS (DIFF): 0 10*9/L (ref 0.0–0.5)
EOSINOPHILS - REL (DIFF): 0 %
LYMPHOCYTES - ABS (DIFF): 0.8 10*9/L — ABNORMAL LOW (ref 1.1–3.6)
LYMPHOCYTES - REL (DIFF): 36 %
MONOCYTES - ABS (DIFF): 0 10*9/L — ABNORMAL LOW (ref 0.3–0.8)
MONOCYTES - REL (DIFF): 2 %
NEUTROPHILS - ABS (DIFF): 1.4 10*9/L — ABNORMAL LOW (ref 1.8–7.8)
NEUTROPHILS - REL (DIFF): 62 %

## 2022-11-29 LAB — PHOSPHORUS: PHOSPHORUS: 2.8 mg/dL (ref 2.4–5.1)

## 2022-11-29 LAB — MAGNESIUM: MAGNESIUM: 1.9 mg/dL (ref 1.6–2.6)

## 2022-11-29 LAB — COMPREHENSIVE METABOLIC PANEL
ALBUMIN: 3.5 g/dL (ref 3.4–5.0)
ALKALINE PHOSPHATASE: 114 U/L (ref 46–116)
ALT (SGPT): 21 U/L (ref 10–49)
ANION GAP: 7 mmol/L (ref 5–14)
AST (SGOT): 23 U/L (ref <=34)
BILIRUBIN TOTAL: 0.9 mg/dL (ref 0.3–1.2)
BLOOD UREA NITROGEN: 29 mg/dL — ABNORMAL HIGH (ref 9–23)
BUN / CREAT RATIO: 24
CALCIUM: 9 mg/dL (ref 8.7–10.4)
CHLORIDE: 107 mmol/L (ref 98–107)
CO2: 23 mmol/L (ref 20.0–31.0)
CREATININE: 1.19 mg/dL — ABNORMAL HIGH
EGFR CKD-EPI (2021) MALE: 64 mL/min/{1.73_m2} (ref >=60)
GLUCOSE RANDOM: 134 mg/dL (ref 70–179)
POTASSIUM: 3.8 mmol/L (ref 3.4–4.8)
PROTEIN TOTAL: 6.8 g/dL (ref 5.7–8.2)
SODIUM: 137 mmol/L (ref 135–145)

## 2022-11-29 LAB — URIC ACID: URIC ACID: 2.4 mg/dL — ABNORMAL LOW

## 2022-11-29 LAB — LACTATE DEHYDROGENASE: LACTATE DEHYDROGENASE: 215 U/L (ref 120–246)

## 2022-11-29 MED ORDER — OMEPRAZOLE 40 MG CAPSULE,DELAYED RELEASE
ORAL_CAPSULE | Freq: Every day | ORAL | 0 refills | 90 days | Status: CP
Start: 2022-11-29 — End: ?

## 2022-11-29 MED ORDER — VORICONAZOLE 200 MG TABLET
ORAL_TABLET | Freq: Two times a day (BID) | ORAL | 5 refills | 30 days | Status: CP
Start: 2022-11-29 — End: 2022-11-29

## 2022-11-29 MED ADMIN — azaCITIDine (VIDAZA) syringe: 75 mg/m2 | SUBCUTANEOUS | @ 19:00:00 | Stop: 2022-11-29

## 2022-11-29 MED ADMIN — ondansetron (ZOFRAN) tablet 8 mg: 8 mg | ORAL | @ 19:00:00 | Stop: 2022-11-29

## 2022-11-29 NOTE — Unmapped (Signed)
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 He reports he feels well. Fatigue still biggest issue but trying to stay active. Feels well. No SOB, occasional dizziness, no headaches/palpitations (off of diltiazem); Appetite okay. No nausea. Drinking some water but knows it is not enough. Bowels have been an issue, he's noting constipation with less frequent stools and harder stools overall. This is with using cholestyramine once a day only in the morning and separating from venetoclax administration. We discussed stopping cholestyramine and trialing stool softener. Appropriately taking all medications as instructed including venetoclax ramp-up followed by 400 mg daily after dinner. No fevers or bleeding.     Labs: WBC 2.2, ANC 1.4, Hgb 9.4, PLT 17; CMP notable for elevated creatinine 1.19 (baseline 0.8 to 1)  Vitals: BP 142/79; HR 97    Oncologic History:  Oncology History Overview Note   Referring/Local Oncologist:  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      This electronic signature is attestation that the pathologist personally reviewed the submitted material(s) and the final diagnosis reflects that evaluation.          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 11/04/18)  A Randomized Study of Daratumumab Plus Lenalidomide Versus Lenalidomide Alone as Maintenance Treatment in Patients with Newly Diagnosed Multiple Myeloma Who Are Minimal Residual Disease Positive After Frontline Autologous Stem Cell Transplant.     History of auto stem cell transplant (CMS-HCC)   08/05/2019 Initial Diagnosis    History of auto stem cell transplant (CMS-HCC)     10/22/2019 - 06/27/2022 Chemotherapy    STUDY [id number] IRB# [id number] LENALIDOMIDE (v. 11/04/18)  A Randomized Study of Daratumumab Plus Lenalidomide Versus Lenalidomide Alone as Maintenance Treatment in Patients with Newly Diagnosed Multiple Myeloma Who Are Minimal Residual Disease Positive After Frontline Autologous Stem Cell Transplant.     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   Latest known visit with results is:   Hospital Outpatient Visit on 11/25/2022   Component Date Value Ref Range Status    Magnesium 11/25/2022 1.8  1.6 - 2.6 mg/dL Final    Phosphorus 29/56/2130 2.7  2.4 - 5.1 mg/dL Final    LDH 86/57/8469 187  120 - 246 U/L Final    Uric Acid 11/25/2022 2.3 (L)  3.7 - 9.2 mg/dL Final    Sodium 62/95/2841 138  135 - 145 mmol/L Final    Potassium 11/25/2022 4.0  3.4 - 4.8 mmol/L Final    Chloride 11/25/2022 108 (H)  98 - 107 mmol/L Final    CO2 11/25/2022 25.0  20.0 - 31.0 mmol/L Final    Anion Gap 11/25/2022 5  5 - 14 mmol/L Final    BUN 11/25/2022 20  9 - 23 mg/dL Final    Creatinine 32/44/0102 1.05  0.73 - 1.18 mg/dL Final    BUN/Creatinine Ratio 11/25/2022 19   Final    eGFR CKD-EPI (2021) Male 11/25/2022 74  >=60 mL/min/1.73m2 Final    eGFR calculated with CKD-EPI 2021 equation in accordance with [employer] and [employer] of Nephrology Task Force recommendations.    Glucose 11/25/2022 156  70 - 179 mg/dL Final    Calcium 72/53/6644 8.5 (L)  8.7 - 10.4 mg/dL Final    Albumin 03/47/4259 3.4  3.4 - 5.0 g/dL Final    Total Protein 11/25/2022 6.3  5.7 - 8.2 g/dL Final    Total Bilirubin 11/25/2022 0.8  0.3 - 1.2 mg/dL Final    AST 56/38/7564 21  <=34 U/L Final    ALT 11/25/2022 24  10 - 49 U/L Final    Alkaline Phosphatase 11/25/2022 103  46 - 116 U/L Final    WBC 11/25/2022 1.8 (L)  3.6 - 11.2 10*9/L Final    RBC 11/25/2022 2.23 (L)  4.26 - 5.60 10*12/L Final    HGB 11/25/2022 7.0 (L)  12.9 - 16.5 g/dL Final    HCT 33/29/5188 20.4 (L)  39.0 - 48.0 % Final    MCV 11/25/2022 91.2  77.6 - 95.7 fL Final    MCH 11/25/2022 31.3  25.9 - 32.4 pg Final    MCHC 11/25/2022 34.3  32.0 - 36.0 g/dL Final    RDW 41/66/0630 18.1 (H)  12.2 - 15.2 % Final    MPV 11/25/2022 7.7  6.8 - 10.7 fL Final    Platelet 11/25/2022 17 (L)  150 - 450 10*9/L Final    Neutrophils % 11/25/2022 51.0  % Final  Lymphocytes % 11/25/2022 41.0  % Final    Monocytes % 11/25/2022 7.3  % Final    Eosinophils % 11/25/2022 0.0  % Final    Basophils % 11/25/2022 0.7  % Final    Absolute Neutrophils 11/25/2022 0.9 (L)  1.8 - 7.8 10*9/L Final    Absolute Lymphocytes 11/25/2022 0.7 (L)  1.1 - 3.6 10*9/L Final    Absolute Monocytes 11/25/2022 0.1 (L)  0.3 - 0.8 10*9/L Final    Absolute Eosinophils 11/25/2022 0.0  0.0 - 0.5 10*9/L Final    Absolute Basophils 11/25/2022 0.0  0.0 - 0.1 10*9/L Final    Anisocytosis 11/25/2022 Slight (A)  Not Present Final    Crossmatch 11/26/2022 Compatible   Final    Unit Blood Type 11/26/2022 A Pos   Final    ISBT Number 11/26/2022 [id number]   Final    Unit # 11/26/2022 [id number]   Final    Status 11/26/2022 Transfused   Final    Product ID 11/26/2022 Red Blood Cells   Final    PRODUCT CODE 11/26/2022 [id number]   Final    Crossmatch 11/26/2022 Compatible   Final    Unit Blood Type 11/26/2022 A Pos   Final    ISBT Number 11/26/2022 [id number]   Final    Unit # 11/26/2022 [id number]   Final    Status 11/26/2022 Transfused   Final    Product ID 11/26/2022 Red Blood Cells   Final    PRODUCT CODE 11/26/2022 [id number]   Final       Allergies: No Known Allergies    Drug Interactions: would need to adjust venetoclax with strong/moderate 3A4 and Pgp inhibitors; avoid strong 3A4 inducers; must separate from Pgp substrates; target dose of venetoclax with concurrent voriconazole is 100 mg daily. With start of voriconazole, caution with sildenafil (PRN for ED though he's not been using recently) and monitor for orthostasis with tamsulosin. Diltiazem was discontinued prior to treatment start to avoid DDI.     Current Medications:  Current Outpatient Medications   Medication Sig Dispense Refill    allopurinol (ZYLOPRIM) 300 MG tablet Take 1 tablet (300 mg total) by mouth daily. 30 tablet 2 calcium carbonate (TUMS) 200 mg calcium (500 mg) chewable tablet Chew 2 tablets (400 mg of elem calcium total) Three (3) times a day.      cholecalciferol, vitamin D3 25 mcg, 1,000 units,, 1,000 unit (25 mcg) tablet Take 1 tablet (25 mcg total) by mouth.      cholestyramine (QUESTRAN) 4 gram packet Take 1 packet by mouth in the morning. Mix 4 g (1 packet) into 2-3 ounces of fluid and take twice a day.. 60 each 2    cholestyramine (QUESTRAN) 4 gram packet TAKE 1 PACKET BY MOUTH EVERY MORNING. MIX 4 GRAMS INTO 2 TO 3 OZ OF FLUID AND TAKE TWICE DAILY 60 each 2    cyanocobalamin, vitamin B-12, 2,000 mcg Tab Take 2,000 mcg by mouth in the morning. 90 tablet 3    fluticasone propionate (FLONASE) 50 mcg/actuation nasal spray 1 spray into each nostril daily as needed.      levoFLOXacin (LEVAQUIN) 500 MG tablet Take 1 tablet (500 mg total) by mouth daily. 30 tablet 1    loratadine (CLARITIN) 10 mg tablet Take 1 tablet (10 mg total) by mouth daily.      metoprolol succinate (TOPROL-XL) 50 MG 24 hr tablet Take 1 tablet (50 mg total) by mouth.      multivitamin with minerals tablet Take 1  tablet by mouth daily.      omeprazole (PRILOSEC) 40 MG capsule Take 1 capsule (40 mg total) by mouth daily. 90 capsule 0    ondansetron (ZOFRAN) 8 MG tablet Take 1 tablet (8 mg total) by mouth every eight (8) hours as needed for nausea. 30 tablet 2    sildenafiL, pulm.hypertension, (REVATIO) 20 mg tablet Take 1-5 tablets (20-100 mg total) by mouth daily as needed (for erectile function). 90 tablet 3    tamsulosin (FLOMAX) 0.4 mg capsule Take 1 capsule (0.4 mg total) by mouth daily. 90 capsule 0    venetoclax (VENCLEXTA) 100 mg tablet Take 4 tablets (400 mg total) by mouth daily. Take with a meal and water. Do not chew, crush, or break tablets. 120 tablet 1    voriconazole (VFEND) 200 MG tablet Take 1 tablet (200 mg total) by mouth two (2) times a day. 60 tablet 5     No current facility-administered medications for this visit. Facility-Administered Medications Ordered in Other Visits   Medication Dose Route Frequency Provider Last Rate Last Admin    DTaP-hepatitis B recombinant-IPV (PEDIARIX) 10 mcg-25Lf-25 mcg-10Lf/0.5 mL injection             DTaP-hepatitis B recombinant-IPV (PEDIARIX) 10 mcg-25Lf-25 mcg-10Lf/0.5 mL injection             haemophilus B polysac-tetanus toxoid (ActHIB) 10 mcg/0.5 mL injection             influenza vaccine quad (FLUARIX, FLULAVAL, FLUZONE) (6 MOS & UP) 2020-21 [facility]S Med             pneumococcal conjugate (13-valent) (PREVNAR-13) 0.5 mL vaccine             pneumococcal conjugate (13-valent) (PREVNAR-13) 0.5 mL vaccine             varicella-zoster gE-AS01B (PF) (SHINGRIX) 50 mcg/0.5 mL injection                Adherence: no barriers identified        Assessment: Timothy Porter is a 77 y.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 He is feeling well. Not neutropenic on labs, will therefore hold off on starting voriconazole and continue venetoclax 400 mg daily dose.     Plan:   1) AML  -Continue with B-AML S12 azacitidine/venetoclax C1D8  -Venetoclax 400 mg daily (for 28 day duration)  -Twice weekly labs/transfusions  -C1 marrow on 1/17, [name] on 1/24    2) TLS prophylaxis  -Continue allopurinol 300 mg PO daily    3) Infection prophylaxis  -Continue levofloxacin 500 mg PO daily  -Continue valacyclovir 500 mg PO daily  -Hold off on starting voriconazole 200 mg PO BID given ANC >0.5. Rx discontinued at this time, however if needed he can pick up from [facility] for $10 copay and Rx will need to be re-sent. Venetoclax dose will need to be adjusted if voriconazole started.     4) CV  -Continue OFF off diltiazem  -Continue metoprolol succinate 50 mg once daily, can titrate as needed for BP/HR  -Follows with [facility]    5) Supportive care  -Continue ondansetron 8 mg up to every 8 hours prn nausea/vomiting  -Stop cholestyramine due to constipation  -Start docusate 50-100 mg PO nightly at bedtime  -Push PO fluids today, creatinine elevated    F/u:  Future Appointments   Date Time Provider Department Center  11/29/2022 12:00 PM [name] Simpson, AGNP [facility]   11/29/2022 12:30 PM ONCHEM LEUKEMIA PHARMACIST [facility]   11/29/2022  1:00 PM ONCINF CHAIR 25 [facility]   12/03/2022 10:30 AM ONCINF CHAIR 08 [facility]   12/06/2022  8:15 AM ADULT ONC LAB [facility]   12/06/2022  9:00 AM [name] [place], AGNP [facility]   12/06/2022 10:00 AM ONCHEM LEUKEMIA PHARMACIST [facility]   12/06/2022 11:30 AM ONCINF CHAIR 25 [facility]   12/10/2022  9:30 AM ONCINF CHAIR 11 [facility]   12/13/2022  7:45 AM ADULT ONC LAB [facility]   12/13/2022  9:00 AM ONCINF CHAIR 20 [facility]   12/13/2022  9:00 AM Lenon Ahmadi, AGNP [facility]   12/17/2022  9:30 AM ONCINF CHAIR 11 [facility]   12/20/2022 12:00 PM ADULT ONC LAB [facility]   12/20/2022  1:00 PM Doreatha Lew, MD [facility]   12/20/2022  2:30 PM ONCINF CHAIR 35 [facility]   12/21/2022  2:00 PM ONCINF CHAIR 16 [facility]   12/22/2022  2:00 PM ONCINF CHAIR 26 [facility]   12/23/2022  8:30 AM ONCINF CHAIR 02 [facility]   12/24/2022  8:30 AM ONCINF CHAIR 02 [facility]   12/25/2022  2:00 PM ONCINF CHAIR 34 [facility]   12/26/2022  2:00 PM ONCINF CHAIR 01 [facility]   02/13/2023  4:00 PM Ruthell Rummage, MD [facility]       I spent 30 minutes with Timothy Porter in direct patient care.     Ronnald Collum, PharmD, BCOP, CPP  Hematology/Oncology Clinical Pharmacist  Pager [phone]

## 2022-11-29 NOTE — Unmapped (Addendum)
-  Drink more water!   -STOP cholestyramine  -Add docusate 1 pill at bedtime  -Continue all other medicines, including venetoclax (Venetoclax) 4 tablets (400 mg) by mouth once a day.   -You don't need voriconazole (antifungal medicine) right now.

## 2022-11-29 NOTE — Unmapped (Signed)
1540 PT arrived for scheduled injection. Pt tolerated injection and discharged home with family.

## 2022-11-29 NOTE — Unmapped (Unsigned)
[facility] Leukemia Clinic Visit Note     Patient Name: Timothy Porter  Patient Age: 77 y.o.  Encounter Date: 11/29/2022    Primary Care Provider:  Pcp, None Per Patient    Referring Physician:  Pcp, None Per Patient  [address] [place],  [place] [address]    Reason for visit:  AML     Assessment:  Timothy Porter is a 77 y.o. 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  He consented to [facility] S12 arm (ven x 28 days) and began treatment on 11/22/2022.  He presents to clinic today for follow up.     Timothy Porter is tolerating treatment on aza/ven well without toxicities or complications.  He has experienced constipation over the last week and will start colace and stop cholestyramine for relief.  Today is day 8 and he will continue with venetoclax 400mg  x 28 days. On lab review his hgb is 9.7, plt 17 and ANC 1.4.  No transfusion support required today.  He will continue valtrex, levaquin, and allopurinol for ppx.  Will hold on starting voriconazole 200mg  BID for antifungal ppx until ANC <0.5.  He will continue with twice weekly lab monitoring.  He will have repeat marrow testing done during week 3.  He will return to clinic in 1 week for follow up.     Free Kappa Chain MM:  He will continue follow up with MM team and has upcoming appt with [name] on 02/13/2023      Plan:   - continue on C1D8 aza/ven with venetoclax 400mg  daily  - labs 2x/weekly  - continue allopurinol, levaquin, and valtrex  - will plan to start voriconazole 200mg  BID if ANC <0.5  - BMbx 1/17  - RTC in 1 week for follow up      [name] was available    [name], AGNP-BC  Leukemia Research Nurse Practitioner  Hematology/Oncology Division  [facility]  11/29/2022    I personally spent 40 minutes face-to-face and non-face-to-face in the care of this patient, which includes all pre, intra, and post visit time on the date of service.  All documented time was specific to the E/M visit and does not include any procedures that may have been performed.    Oncology History Overview Note   Referring/Local Oncologist:  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      This electronic signature is attestation that the pathologist personally reviewed the submitted material(s) and the  final diagnosis reflects that evaluation.          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 11/04/18)  A Randomized Study of Daratumumab Plus Lenalidomide Versus Lenalidomide Alone as Maintenance Treatment in Patients with Newly Diagnosed Multiple Myeloma Who Are Minimal Residual Disease Positive After Frontline Autologous Stem Cell Transplant.     History of auto stem cell transplant (CMS-HCC)   08/05/2019 Initial Diagnosis    History of auto stem cell transplant (CMS-HCC)     10/22/2019 - 06/27/2022 Chemotherapy    STUDY [id number] IRB# [id number] LENALIDOMIDE (v. 11/04/18)  A Randomized Study of Daratumumab Plus Lenalidomide Versus Lenalidomide Alone as Maintenance Treatment in Patients with Newly Diagnosed Multiple Myeloma Who Are Minimal Residual Disease Positive After Frontline Autologous Stem Cell Transplant.     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  He denies any n/v/d.  No new cough or sob.  He endorses having constipation, but plans to start colace and stop cholestyramine.  No new fevers/chills.  He states he walked for about today which is good for him over the last few weeks.     Otherwise, he denies new constitutional symptoms such as anorexia, weight loss, fatigue, night sweats or unexplained fevers.  Furthermore, he denies unexplained bleeding or bruising, recurrent or unexplained intercurrent infections, dyspnea on exertion, lightheadedness, palpitations or chest pain.  There have been no new or unexplained pains or self-identified masses, swelling or enlarged lymph nodes.    ROS reviewed and negative except as noted in H and P     ECOG: 1    Allergies:  No Known Allergies    Medications:   Current Outpatient Medications:     allopurinol (ZYLOPRIM) 300 MG tablet, Take 1 tablet (300 mg total) by mouth daily., Disp: 30 tablet, Rfl: 2    calcium carbonate (TUMS) 200 mg calcium (500 mg) chewable tablet, Chew 2 tablets (400 mg of elem calcium total) Three (3) times a day., Disp: , Rfl:     cholecalciferol, vitamin D3 25 mcg, 1,000 units,, 1,000 unit (25 mcg) tablet, Take 1 tablet (25 mcg total) by mouth., Disp: , Rfl:     cyanocobalamin, vitamin B-12, 2,000 mcg Tab, Take 2,000 mcg by mouth in the morning., Disp: 90 tablet, Rfl: 3    fluticasone propionate (FLONASE) 50 mcg/actuation nasal spray, 1 spray into each nostril daily as needed., Disp: , Rfl:     levoFLOXacin (LEVAQUIN)  500 MG tablet, Take 1 tablet (500 mg total) by mouth daily., Disp: 30 tablet, Rfl: 1    loratadine (CLARITIN) 10 mg tablet, Take 1 tablet (10 mg total) by mouth daily., Disp: , Rfl:     metoprolol succinate (TOPROL-XL) 50 MG 24 hr tablet, Take 1 tablet (50 mg total) by mouth., Disp: , Rfl:     multivitamin with minerals tablet, Take 1 tablet by mouth daily., Disp: , Rfl:     omeprazole (PRILOSEC) 40 MG capsule, Take 1 capsule (40 mg total) by mouth daily., Disp: 90 capsule, Rfl: 0    ondansetron (ZOFRAN) 8 MG tablet, Take 1 tablet (8 mg total) by mouth every eight (8) hours as needed for nausea., Disp: 30 tablet, Rfl: 2    tamsulosin (FLOMAX) 0.4 mg capsule, Take 1 capsule (0.4 mg total) by mouth daily., Disp: 90 capsule, Rfl: 0    venetoclax (VENCLEXTA) 100 mg tablet, Take 4 tablets (400 mg total) by mouth daily. Take with a meal and water. Do not chew, crush, or break tablets., Disp: 120 tablet, Rfl: 1    docusate sodium (COLACE) 50 MG capsule, Take 1 capsule (50 mg total) by mouth two (2) times a day., Disp: , Rfl:     sildenafiL, pulm.hypertension, (REVATIO) 20 mg tablet, Take 1-5 tablets (20-100 mg total) by mouth daily as needed (for erectile function)., Disp: 90 tablet, Rfl: 3    valACYclovir (VALTREX) 500 MG tablet, Take 1 tablet (500 mg total) by mouth daily for 90 doses., Disp: 90 tablet, Rfl: 0  No current facility-administered medications for this visit.    Facility-Administered Medications Ordered in Other Visits:     DTaP-hepatitis B recombinant-IPV (PEDIARIX) 10 mcg-25Lf-25 mcg-10Lf/0.5 mL injection, , , ,     DTaP-hepatitis B recombinant-IPV (PEDIARIX) 10 mcg-25Lf-25 mcg-10Lf/0.5 mL injection, , , ,     haemophilus B polysac-tetanus toxoid (ActHIB) 10 mcg/0.5 mL injection, , , ,     influenza vaccine quad (FLUARIX, FLULAVAL, FLUZONE) (6 MOS & UP) 2020-21 ADS Med, , , ,     pneumococcal conjugate (13-valent) (PREVNAR-13) 0.5 mL vaccine, , , ,     pneumococcal conjugate (13-valent) (PREVNAR-13) 0.5 mL vaccine, , , ,     varicella-zoster gE-AS01B (PF) (SHINGRIX) 50 mcg/0.5 mL injection, , , ,     Medical History:  Past Medical History:   Diagnosis Date    Benign prostatic hyperplasia with lower urinary tract symptoms 11/27/2018    BPH (benign prostatic hyperplasia)     Diarrhea     Fatigue     Fractures     left arm    Glucose intolerance     May 2019: HbA1c 6.2.    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  Planning to build more apartments.       Family History:  Family History   Problem Relation Age of Onset    Dementia Father     No Known Problems Other        Objective:   Vitals:    11/29/22 1134   BP: 142/79   Pulse: 97   Resp:  15   Temp: 36.8 ??C (98.2 ??F)   SpO2: 100%       Physical Exam:  General: Resting, in no apparent distress  HEENT:  PERRL. No scleral icterus or conjunctival injection.   Heart:  RRR.  S1, S2.  No murmurs, gallops or rubs. Mild non-pitting edema noted BLE.   Lungs:  Breathing is unlabored, and patient is speaking full sentences with ease.  CTAB. No rales, ronchi or crackles.    Abdomen:  No distention or pain on palpation.  Bowel sounds are present.  No palpable masses.  Skin:  No rashes, petechiae or purpura. Grossly intact.  Musculoskeletal:  strength is equal bilaterally   Psychiatric:  Range of affect is appropriate.    Neurologic:  Alert and oriented x 4.  Steady gait with assistance of cane.    Test Results:  Lab on 11/29/2022   Component Date Value Ref Range Status    ABO Grouping 11/29/2022 A POS   Final    Antibody Screen 11/29/2022 NEG   Final    Sodium 11/29/2022 137  135 - 145 mmol/L Final    Potassium 11/29/2022 3.8  3.4 - 4.8 mmol/L Final    Chloride 11/29/2022 107  98 - 107 mmol/L Final    CO2 11/29/2022 23.0  20.0 - 31.0 mmol/L Final    Anion Gap 11/29/2022 7  5 - 14 mmol/L Final    BUN 11/29/2022 29 (H)  9 - 23 mg/dL Final    Creatinine 16/08/9603 1.19 (H)  0.73 - 1.18 mg/dL Final    BUN/Creatinine Ratio 11/29/2022 24   Final    eGFR CKD-EPI (2021) Male 11/29/2022 64  >=60 mL/min/1.83m2 Final    eGFR calculated with CKD-EPI 2021 equation in accordance with [employer] and [employer] of Nephrology Task Force recommendations.    Glucose 11/29/2022 134  70 - 179 mg/dL Final    Calcium 54/07/8118 9.0  8.7 - 10.4 mg/dL Final    Albumin 14/78/2956 3.5  3.4 - 5.0 g/dL Final    Total Protein 11/29/2022 6.8  5.7 - 8.2 g/dL Final    Total Bilirubin 11/29/2022 0.9  0.3 - 1.2 mg/dL Final    AST 21/30/8657 23  <=34 U/L Final    ALT 11/29/2022 21  10 - 49 U/L Final    Alkaline Phosphatase 11/29/2022 114  46 - 116 U/L Final    Magnesium 11/29/2022 1.9  1.6 - 2.6 mg/dL Final    Phosphorus 84/69/6295 2.8  2.4 - 5.1 mg/dL Final    LDH 28/41/3244 215  120 - 246 U/L Final    Uric Acid 11/29/2022 2.4 (L)  3.7 - 9.2 mg/dL Final    WBC 11/29/7251 2.2 (L)  3.6 - 11.2 10*9/L Final    RBC 11/29/2022 2.96 (L)  4.26 - 5.60 10*12/L Final    HGB 11/29/2022 9.4 (L)  12.9 - 16.5 g/dL Final    HCT 66/44/0347 26.4 (L)  39.0 - 48.0 % Final    MCV 11/29/2022 89.2  77.6 - 95.7 fL Final    MCH 11/29/2022 31.7  25.9 - 32.4 pg Final    MCHC 11/29/2022 35.5  32.0 - 36.0 g/dL Final    RDW 42/59/5638 16.8 (H)  12.2 - 15.2 % Final    MPV 11/29/2022 8.0  6.8 - 10.7 fL Final    Platelet 11/29/2022 17 (L)  150 - 450 10*9/L Final    Anisocytosis 11/29/2022 Slight (A)  Not Present Final    Neutrophils %  11/29/2022 62  % Final    Lymphocytes % 11/29/2022 36  % Final    Monocytes % 11/29/2022 2  % Final    Eosinophils % 11/29/2022 0  % Final    Basophils % 11/29/2022 0  % Final    Absolute Neutrophils 11/29/2022 1.4 (L)  1.8 - 7.8 10*9/L Final    Absolute Lymphocytes 11/29/2022 0.8 (L)  1.1 - 3.6 10*9/L Final    Absolute Monocytes 11/29/2022 0.0 (L)  0.3 - 0.8 10*9/L Final    Absolute Eosinophils 11/29/2022 0.0  0.0 - 0.5 10*9/L Final    Absolute Basophils 11/29/2022 0.0  0.0 - 0.1 10*9/L Final    Smear Review Comments 11/29/2022 See Comment (A)  Undefined Final    [id number]  Slide reviewed. clubbing, edema or cyanosis.    Test Results:

## 2022-11-30 NOTE — Unmapped (Signed)
Pt tolerated chemo injections without difficulty.  Pt was stable at discharge via self ambulation with cane and steady gait.

## 2022-11-30 NOTE — Unmapped (Signed)
RED ZONE Means: RED ZONE: Take action now!     You need to be seen right away  Symptoms are at a severe level of discomfort    Call 911 or go to your nearest  Hospital for help     - Bleeding that will not stop    - Hard to breathe    - New seizure - Chest pain  - Fall or passing out  -Thoughts of hurting    yourself or others      Call 911 if you are going into the RED ZONE                  YELLOW ZONE Means:     Please call with any new or worsening symptom(s), even if not on this list.  Call [phone]  After hours, weekends, and holidays - you will reach a long recording with specific instructions, If not in an emergency such as above, please listen closely all the way to the end and choose the option that relates to your need.   You can be seen by a provider the same day through our Same Day Acute Care for Patients with Cancer program.      YELLOW ZONE: Take action today     Symptoms are new or worsening  You are not within your goal range for:    - Pain    - Shortness of breath    - Bleeding (nose, urine, stool, wound)    - Feeling sick to your stomach and throwing up    - Mouth sores/pain in your mouth or throat    - Hard stool or very loose stools (increase in       ostomy output)    - No urine for 12 hours    - Feeding tube or other catheter/tube issue    - Redness or pain at previous IV or port/catheter site    - Depressed or anxiety   - Swelling (leg, arm, abdomen,     face, neck)  - Skin rash or skin changes  - Wound issues (redness, drainage,    re-opened)  - Confusion  - Vision changes  - Fever >100.4 F or chills  - Worsening cough with mucus that is    green, yellow, or bloody  - Pain or burning when going to the    bathroom  - Home Infusion Pump Issue- call    [phone]         Call your healthcare provider if you are going into the YELLOW ZONE     GREEN ZONE Means:  Your symptoms are under controls  Continue to take your medicine as ordered  Keep all visits to the provider GREEN ZONE: You are in control  No increase or worsening symptoms  Able to take your medicine  Able to drink and eat    - DO NOT use MyChart messages to report red or yellow symptoms. Allow up to 3    business days for a reply.  -MyChart is for non-urgent medication refills, scheduling requests, or other general questions.         [id number] Rev. 05/26/2022  Approved by Oncology Patient Education Committee

## 2022-12-03 ENCOUNTER — Ambulatory Visit: Admit: 2022-12-03 | Discharge: 2022-12-04 | Payer: MEDICARE

## 2022-12-03 ENCOUNTER — Encounter
Admit: 2022-12-03 | Discharge: 2022-12-04 | Payer: MEDICARE | Attending: Nurse Practitioner | Primary: Nurse Practitioner

## 2022-12-03 LAB — CBC W/ AUTO DIFF
BASOPHILS ABSOLUTE COUNT: 0 10*9/L (ref 0.0–0.1)
BASOPHILS RELATIVE PERCENT: 0.7 %
EOSINOPHILS ABSOLUTE COUNT: 0 10*9/L (ref 0.0–0.5)
EOSINOPHILS RELATIVE PERCENT: 0 %
HEMATOCRIT: 22.1 % — ABNORMAL LOW (ref 39.0–48.0)
HEMOGLOBIN: 7.9 g/dL — ABNORMAL LOW (ref 12.9–16.5)
LYMPHOCYTES ABSOLUTE COUNT: 0.8 10*9/L — ABNORMAL LOW (ref 1.1–3.6)
LYMPHOCYTES RELATIVE PERCENT: 67.8 %
MEAN CORPUSCULAR HEMOGLOBIN CONC: 35.8 g/dL (ref 32.0–36.0)
MEAN CORPUSCULAR HEMOGLOBIN: 31.7 pg (ref 25.9–32.4)
MEAN CORPUSCULAR VOLUME: 88.6 fL (ref 77.6–95.7)
MEAN PLATELET VOLUME: 8.3 fL (ref 6.8–10.7)
MONOCYTES ABSOLUTE COUNT: 0.2 10*9/L — ABNORMAL LOW (ref 0.3–0.8)
MONOCYTES RELATIVE PERCENT: 17.2 %
NEUTROPHILS ABSOLUTE COUNT: 0.2 10*9/L — CL (ref 1.8–7.8)
NEUTROPHILS RELATIVE PERCENT: 14.3 %
PLATELET COUNT: 13 10*9/L — ABNORMAL LOW (ref 150–450)
RED BLOOD CELL COUNT: 2.49 10*12/L — ABNORMAL LOW (ref 4.26–5.60)
RED CELL DISTRIBUTION WIDTH: 16.7 % — ABNORMAL HIGH (ref 12.2–15.2)
WBC ADJUSTED: 1.2 10*9/L — ABNORMAL LOW (ref 3.6–11.2)

## 2022-12-03 NOTE — Unmapped (Signed)
In lobby, pt reported feeling very dizzy when ambulating, pt became unsteady on feet, pt sat down in a chair. BP 213/102 HR 108. Rechecked for 168/95 HR 112. When standing BP 94/74 HR 120. Pt reports feeling better pt states he feels safe to walk. Pt able to ambulate to chair 5. Denies dizziness at this time.     Message sent to NP [facility].    22g PIV placed r ac. Flushes well, + blood return. Labs drawn and sent. Pending results    Spoke w pt son. Per son, pt gets dizzy at home and will take zofran for the dizziness. Education provided to pt and son. Instructed pt to check BP at home from sitting and standing esp when has dizzy spells. Pt denies having episodes like today at home.     Recheck orthostatics post 2u prbc   Sitting HR 90 BP 170/81   standing 158/75 HR 100  Denies dizziness with ambulation  NP Brower notified and aware.

## 2022-12-03 NOTE — Unmapped (Signed)
Pt in chair 5 for transfusion.  PIV placed in RAC, + blood return, flushed well. Labs collected from PIV and sent.  Pending lab results.  Blood transfusing via PIV, denies reaction at this time.  Tolerated PRBC, PIV removed.   AVS declined.  Discharged ambulatory to home.

## 2022-12-03 NOTE — Unmapped (Signed)
Hospital Outpatient Visit on 12/03/2022   Component Date Value Ref Range Status    ABO Grouping 12/03/2022 A POS   Final    Antibody Screen 12/03/2022 NEG   Final    WBC 12/03/2022 1.2 (L)  3.6 - 11.2 10*9/L Final    RBC 12/03/2022 2.49 (L)  4.26 - 5.60 10*12/L Final    HGB 12/03/2022 7.9 (L)  12.9 - 16.5 g/dL Final    HCT 16/08/9603 22.1 (L)  39.0 - 48.0 % Final    MCV 12/03/2022 88.6  77.6 - 95.7 fL Final    MCH 12/03/2022 31.7  25.9 - 32.4 pg Final    MCHC 12/03/2022 35.8  32.0 - 36.0 g/dL Final    RDW 54/07/8118 16.7 (H)  12.2 - 15.2 % Final    MPV 12/03/2022 8.3  6.8 - 10.7 fL Final    Platelet 12/03/2022 13 (L)  150 - 450 10*9/L Final    Neutrophils % 12/03/2022 14.3  % Final    Lymphocytes % 12/03/2022 67.8  % Final    Monocytes % 12/03/2022 17.2  % Final    Eosinophils % 12/03/2022 0.0  % Final    Basophils % 12/03/2022 0.7  % Final    Absolute Neutrophils 12/03/2022 0.2 (LL)  1.8 - 7.8 10*9/L Final    Absolute Lymphocytes 12/03/2022 0.8 (L)  1.1 - 3.6 10*9/L Final    Absolute Monocytes 12/03/2022 0.2 (L)  0.3 - 0.8 10*9/L Final    Absolute Eosinophils 12/03/2022 0.0  0.0 - 0.5 10*9/L Final    Absolute Basophils 12/03/2022 0.0  0.0 - 0.1 10*9/L Final    Anisocytosis 12/03/2022 Slight (A)  Not Present Final    Crossmatch 12/03/2022 Compatible   Final    Unit Blood Type 12/03/2022 A Pos   Final    ISBT Number 12/03/2022 6200   Final    Unit # 12/03/2022 [id number]   Final    Status 12/03/2022 Issued   Final    Spec Expiration 12/03/2022 [id number]   Final    Product ID 12/03/2022 Red Blood Cells   Final    PRODUCT CODE 12/03/2022 [id number]   Final    Crossmatch 12/03/2022 Compatible   Final    Unit Blood Type 12/03/2022 A Neg   Final    ISBT Number 12/03/2022 0600   Final    Unit # 12/03/2022 [id number]   Final    Status 12/03/2022 Issued   Final    Spec Expiration 12/03/2022 [id number]   Final    Product ID 12/03/2022 Red Blood Cells   Final    PRODUCT CODE 12/03/2022 [id number]   Final

## 2022-12-06 ENCOUNTER — Ambulatory Visit: Admit: 2022-12-06 | Discharge: 2022-12-07 | Payer: MEDICARE

## 2022-12-06 ENCOUNTER — Other Ambulatory Visit: Admit: 2022-12-06 | Discharge: 2022-12-07 | Payer: MEDICARE

## 2022-12-06 DIAGNOSIS — C9200 Acute myeloblastic leukemia, not having achieved remission: Principal | ICD-10-CM

## 2022-12-06 LAB — COMPREHENSIVE METABOLIC PANEL
ALBUMIN: 3.5 g/dL (ref 3.4–5.0)
ALKALINE PHOSPHATASE: 110 U/L (ref 46–116)
ALT (SGPT): 19 U/L (ref 10–49)
ANION GAP: 5 mmol/L (ref 5–14)
AST (SGOT): 23 U/L (ref <=34)
BILIRUBIN TOTAL: 1.2 mg/dL (ref 0.3–1.2)
BLOOD UREA NITROGEN: 17 mg/dL (ref 9–23)
BUN / CREAT RATIO: 19
CALCIUM: 8.5 mg/dL — ABNORMAL LOW (ref 8.7–10.4)
CHLORIDE: 110 mmol/L — ABNORMAL HIGH (ref 98–107)
CO2: 26 mmol/L (ref 20.0–31.0)
CREATININE: 0.88 mg/dL
EGFR CKD-EPI (2021) MALE: 90 mL/min/{1.73_m2} (ref >=60)
GLUCOSE RANDOM: 130 mg/dL (ref 70–179)
POTASSIUM: 3.5 mmol/L (ref 3.4–4.8)
PROTEIN TOTAL: 6.5 g/dL (ref 5.7–8.2)
SODIUM: 141 mmol/L (ref 135–145)

## 2022-12-06 LAB — CBC W/ AUTO DIFF
BASOPHILS ABSOLUTE COUNT: 0 10*9/L (ref 0.0–0.1)
BASOPHILS RELATIVE PERCENT: 2.1 %
EOSINOPHILS ABSOLUTE COUNT: 0.1 10*9/L (ref 0.0–0.5)
EOSINOPHILS RELATIVE PERCENT: 7 %
HEMATOCRIT: 26.9 % — ABNORMAL LOW (ref 39.0–48.0)
HEMOGLOBIN: 9.6 g/dL — ABNORMAL LOW (ref 12.9–16.5)
LYMPHOCYTES ABSOLUTE COUNT: 0.9 10*9/L — ABNORMAL LOW (ref 1.1–3.6)
LYMPHOCYTES RELATIVE PERCENT: 63.6 %
MEAN CORPUSCULAR HEMOGLOBIN CONC: 35.6 g/dL (ref 32.0–36.0)
MEAN CORPUSCULAR HEMOGLOBIN: 31.2 pg (ref 25.9–32.4)
MEAN CORPUSCULAR VOLUME: 87.5 fL (ref 77.6–95.7)
MEAN PLATELET VOLUME: 9.1 fL (ref 6.8–10.7)
MONOCYTES ABSOLUTE COUNT: 0 10*9/L — ABNORMAL LOW (ref 0.3–0.8)
MONOCYTES RELATIVE PERCENT: 1.1 %
NEUTROPHILS ABSOLUTE COUNT: 0.4 10*9/L — CL (ref 1.8–7.8)
NEUTROPHILS RELATIVE PERCENT: 26.2 %
PLATELET COUNT: 10 10*9/L — ABNORMAL LOW (ref 150–450)
RED BLOOD CELL COUNT: 3.07 10*12/L — ABNORMAL LOW (ref 4.26–5.60)
RED CELL DISTRIBUTION WIDTH: 16.2 % — ABNORMAL HIGH (ref 12.2–15.2)
WBC ADJUSTED: 1.4 10*9/L — ABNORMAL LOW (ref 3.6–11.2)

## 2022-12-06 LAB — PLATELET COUNT: PLATELET COUNT: 30 10*9/L — ABNORMAL LOW (ref 150–450)

## 2022-12-06 LAB — LACTATE DEHYDROGENASE: LACTATE DEHYDROGENASE: 209 U/L (ref 120–246)

## 2022-12-06 LAB — MAGNESIUM: MAGNESIUM: 1.7 mg/dL (ref 1.6–2.6)

## 2022-12-06 LAB — URIC ACID: URIC ACID: 2.1 mg/dL — ABNORMAL LOW

## 2022-12-06 LAB — PHOSPHORUS: PHOSPHORUS: 2.7 mg/dL (ref 2.4–5.1)

## 2022-12-06 MED ORDER — VORICONAZOLE 200 MG TABLET
ORAL_TABLET | Freq: Two times a day (BID) | ORAL | 2 refills | 30 days | Status: CP
Start: 2022-12-06 — End: ?
  Filled 2022-12-06: qty 60, 30d supply, fill #0

## 2022-12-06 MED ORDER — VENETOCLAX 100 MG TABLET
ORAL_TABLET | Freq: Every day | ORAL | 1 refills | 30.00000 days | Status: CP
Start: 2022-12-06 — End: 2022-12-06

## 2022-12-06 MED ORDER — VALACYCLOVIR 500 MG TABLET
ORAL_TABLET | Freq: Every day | ORAL | 3 refills | 90 days | Status: CP
Start: 2022-12-06 — End: ?

## 2022-12-06 NOTE — Unmapped (Signed)
[facility] Leukemia [facility] Visit Note     Patient Name: Timothy Porter  Patient Age: 77 y.o.  Encounter Date: 12/06/2022    Primary Care Provider:  Pcp, None Per Patient    Referring Physician:  Pcp, None Per Patient  [address] [place],  [place] [address]    Reason for visit:  AML     Assessment:  Timothy Porter is a 77 y.o. 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  He consented to [facility] S12 arm (ven x 28 days) and began treatment on 11/22/2022.  He presents to clinic today for follow up.     [name] is tolerating treatment on aza/ven well without toxicities or complications.  Today is cycle 1 day 15 and on lab review he is noted to be neutropenic with ANC 0.4.  We will plan to start voriconazole 200mg  BID today and dose reduce venetoclax to 100mg  daily.  He will continue with levaquin, valtrex, and allopurinol.  He will receive platelet transfusion today for plt oc 10.  We will continue with 2x/weekly lab monitoring.  He is scheduled for BMbx next week for disease asssessment.  He will RTC in 2 weeks for follow up with [name].    Free Kappa Chain MM:  He will continue follow up with MM team and has upcoming appt with [name] on 02/13/2023                                                      Plan:   - continue on C1D15 aza/ven with DR'd venetoclax 100mg  daily  - start voriconazole 200mg  BID  - labs 2x/weekly  - continue allopurinol, levaquin, and valtrex  - BMbx 1/17  - RTC 1/24 for follow up with [name]      [name] was available    [name], AGNP-BC  Leukemia Research Nurse Practitioner  Hematology/Oncology Division  [facility]  12/06/2022    I personally spent 40 minutes face-to-face and non-face-to-face in the care of this patient, which includes all pre, intra, and post visit time on the date of service.  All documented time was specific to the E/M visit and does not include any procedures that may have been performed.    Oncology History Overview Note   Referring/Local Oncologist:  [name]     Diagnosis:   Diagnosis   Date Value Ref Range Status   10/26/2022   Final    Bone marrow, left iliac, aspiration and biopsy  -  Hypercellular bone marrow (50%) involved by acute myeloid leukemia with MECOM rearrangement (23% blasts by manual aspirate differential and 10-20% CD34-positive blasts by immunohistochemistry)   -  Mild to focally moderate marrow fibrosis (MF-1 to 2)   -  Less than 5% plasma cells by manual aspirate differential count (see Comment)  -  See linked  reports for associated Ancillary Studies.      This electronic signature is attestation that the pathologist personally reviewed the submitted material(s) and the final diagnosis reflects that evaluation.          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 11/04/18)  A Randomized Study of Daratumumab Plus Lenalidomide Versus Lenalidomide Alone as Maintenance Treatment in Patients with Newly Diagnosed Multiple Myeloma Who Are Minimal Residual Disease Positive After Frontline Autologous Stem Cell Transplant.     History of auto stem cell transplant (CMS-HCC)   08/05/2019 Initial Diagnosis    History of auto stem cell transplant (CMS-HCC)     10/22/2019 - 06/27/2022 Chemotherapy    STUDY [id number] IRB# [id number] LENALIDOMIDE (v. 11/04/18)  A Randomized Study of Daratumumab Plus Lenalidomide Versus Lenalidomide Alone as Maintenance Treatment in Patients with Newly Diagnosed Multiple Myeloma Who Are Minimal Residual Disease Positive After Frontline Autologous Stem Cell Transplant.     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 states he has been feeling pretty good.  He did have a dizzy spell 4 days ago in the infusion center, but was found to have hgb 7.9 and received transfusion.  He denies any dizziness or light-headedness since that isolated incident.  He reports a good appetite without n/v.  He does state that his bowel pattern fluctuate between diarrhea and constipation.  Denies any new sob/cough and no few fevers/chills.  No new complaints today.     Otherwise, he denies new constitutional symptoms such as anorexia, weight loss, fatigue, night sweats or unexplained fevers.  Furthermore, he denies unexplained bleeding or bruising, recurrent or unexplained intercurrent infections, dyspnea on exertion, lightheadedness, palpitations or chest pain.  There have been no new or unexplained pains or self-identified masses, swelling or enlarged lymph nodes.    ROS reviewed and negative except as noted in H and P     ECOG: 1    Allergies:  No Known Allergies    Medications:   Current Outpatient Medications:     allopurinol (ZYLOPRIM) 300 MG tablet, Take 1 tablet (300 mg total) by mouth daily., Disp: 30 tablet, Rfl: 2    calcium carbonate (TUMS) 200 mg calcium (500 mg) chewable tablet, Chew 2 tablets (400 mg of elem calcium total) Three (3) times a day., Disp: , Rfl:     cholecalciferol, vitamin D3 25 mcg, 1,000 units,, 1,000 unit (25 mcg) tablet, Take 1 tablet (25 mcg total) by mouth., Disp: , Rfl:     cyanocobalamin, vitamin  B-12, 2,000 mcg Tab, Take 2,000 mcg by mouth in the morning., Disp: 90 tablet, Rfl: 3    docusate sodium (COLACE) 50 MG capsule, Take 1 capsule (50 mg total) by mouth two (2) times a day., Disp: , Rfl:     fluticasone propionate (FLONASE) 50 mcg/actuation nasal spray, 1 spray into each nostril daily as needed., Disp: , Rfl:     levoFLOXacin (LEVAQUIN) 500 MG tablet, Take 1 tablet (500 mg total) by mouth daily., Disp: 30 tablet, Rfl: 1    loratadine (CLARITIN) 10 mg tablet, Take 1 tablet (10 mg total) by mouth daily., Disp: , Rfl:     metoprolol succinate (TOPROL-XL) 50 MG 24 hr tablet, Take 1 tablet (50 mg total) by mouth., Disp: , Rfl:     multivitamin with minerals tablet, Take 1 tablet by mouth daily., Disp: , Rfl:     omeprazole (PRILOSEC) 40 MG capsule, Take 1 capsule (40 mg total) by mouth daily., Disp: 90 capsule, Rfl: 0    ondansetron (ZOFRAN) 8 MG tablet, Take 1 tablet (8 mg total) by mouth every eight (8) hours as needed for nausea., Disp: 30 tablet, Rfl: 2    sildenafiL, pulm.hypertension, (REVATIO) 20 mg tablet, Take 1-5 tablets (20-100 mg total) by mouth daily as needed (for erectile function)., Disp: 90 tablet, Rfl: 3    tamsulosin (FLOMAX) 0.4 mg capsule, Take 1 capsule (0.4 mg total) by mouth daily., Disp: 90 capsule, Rfl: 0    venetoclax (VENCLEXTA) 100 mg tablet, Take 4 tablets (400 mg total) by mouth daily. Take with a meal and water. Do not chew, crush, or break tablets., Disp: 120 tablet, Rfl: 1  No current facility-administered medications for this visit.    Facility-Administered Medications Ordered in Other Visits:     DTaP-hepatitis B recombinant-IPV (PEDIARIX) 10 mcg-25Lf-25 mcg-10Lf/0.5 mL injection, , , ,     DTaP-hepatitis B recombinant-IPV (PEDIARIX) 10 mcg-25Lf-25 mcg-10Lf/0.5 mL injection, , , ,     haemophilus B polysac-tetanus toxoid (ActHIB) 10 mcg/0.5 mL injection, , , ,     influenza vaccine quad (FLUARIX, FLULAVAL, FLUZONE) (6 MOS & UP) 2020-21 ADS Med, , , ,     pneumococcal conjugate (13-valent) (PREVNAR-13) 0.5 mL vaccine, , , ,     pneumococcal conjugate (13-valent) (PREVNAR-13) 0.5 mL vaccine, , , ,     varicella-zoster gE-AS01B (PF) (SHINGRIX) 50 mcg/0.5 mL injection, , , ,     Medical History:  Past Medical History:   Diagnosis Date    Benign prostatic hyperplasia with lower urinary tract symptoms 11/27/2018    BPH (benign prostatic hyperplasia)     Diarrhea     Fatigue     Fractures     left arm    Glucose intolerance     May 2019: HbA1c 6.2.    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  Planning to build more apartments.       Family History:  Family History   Problem Relation Age of Onset    Dementia Father     No Known Problems Other        Objective:  Vitals:    12/06/22 0858   BP: 155/80   Pulse: 86   Resp: 15   Temp: 36.6 ??C (97.8 ??F)   SpO2: 100%       Physical Exam:  General: Resting, in no apparent distress  HEENT:  PERRL. No scleral icterus or conjunctival injection.   Heart:  RRR.  S1, S2.  No murmurs, gallops or rubs. Mild non-pitting edema noted BLE.   Lungs:  Breathing is unlabored, and patient is speaking full sentences with ease.  CTAB. No rales, ronchi or crackles.    Abdomen:  No distention or pain on palpation.  Bowel sounds are present. No palpable masses.  Skin:  No rashes, petechiae or purpura. Grossly intact.  Musculoskeletal:  strength is equal bilaterally   Psychiatric:  Range of affect is appropriate.    Neurologic:  Alert and oriented x 4.  Steady gait with assistance of cane.    Test Results:  Lab on 12/06/2022   Component Date Value Ref Range Status    Sodium 12/06/2022 141  135 - 145 mmol/L Final    Potassium 12/06/2022 3.5  3.4 - 4.8 mmol/L Final    Chloride 12/06/2022 110 (H)  98 - 107 mmol/L Final    CO2 12/06/2022 26.0  20.0 - 31.0 mmol/L Final    Anion Gap 12/06/2022 5  5 - 14 mmol/L Final    BUN 12/06/2022 17  9 - 23 mg/dL Final    Creatinine 16/08/9603 0.88  0.73 - 1.18 mg/dL Final    BUN/Creatinine Ratio 12/06/2022 19   Final    eGFR CKD-EPI (2021) Male 12/06/2022 90  >=60 mL/min/1.58m2 Final    eGFR calculated with CKD-EPI 2021 equation in accordance with [employer] and [employer] of Nephrology Task Force recommendations.    Glucose 12/06/2022 130  70 - 179 mg/dL Final    Calcium 54/07/8118 8.5 (L)  8.7 - 10.4 mg/dL Final    Albumin 14/78/2956 3.5  3.4 - 5.0 g/dL Final    Total Protein 12/06/2022 6.5  5.7 - 8.2 g/dL Final    Total Bilirubin 12/06/2022 1.2  0.3 - 1.2 mg/dL Final    AST 21/30/8657 23  <=34 U/L Final    ALT 12/06/2022 19  10 - 49 U/L Final    Alkaline Phosphatase 12/06/2022 110  46 - 116 U/L Final    Magnesium 12/06/2022 1.7  1.6 - 2.6 mg/dL Final    Phosphorus 84/69/6295 2.7  2.4 - 5.1 mg/dL Final    LDH 28/41/3244 209  120 - 246 U/L Final    Uric Acid 12/06/2022 2.1 (L)  3.7 - 9.2 mg/dL Final    WBC 11/29/7251 1.4 (L)  3.6 - 11.2 10*9/L Final    RBC 12/06/2022 3.07 (L)  4.26 - 5.60 10*12/L Final    HGB 12/06/2022 9.6 (L)  12.9 - 16.5 g/dL Final    HCT 66/44/0347 26.9 (L)  39.0 - 48.0 % Final    MCV 12/06/2022 87.5  77.6 - 95.7 fL Final    MCH 12/06/2022 31.2  25.9 - 32.4 pg Final    MCHC 12/06/2022 35.6  32.0 - 36.0 g/dL Final    RDW 42/59/5638 16.2 (H)  12.2 - 15.2 % Final    MPV 12/06/2022 9.1  6.8 - 10.7 fL Final    Platelet 12/06/2022 10 (L)  150 - 450 10*9/L Final    Questionable result confirmed. Specimen integrity/identity confirmed.      Neutrophils % 12/06/2022 26.2  %  Final    Lymphocytes % 12/06/2022 63.6  % Final    Monocytes % 12/06/2022 1.1  % Final    Eosinophils % 12/06/2022 7.0  % Final    Basophils % 12/06/2022 2.1  % Final    Absolute Neutrophils 12/06/2022 0.4 (LL)  1.8 - 7.8 10*9/L Final    Absolute Lymphocytes 12/06/2022 0.9 (L)  1.1 - 3.6 10*9/L Final    Absolute Monocytes 12/06/2022 0.0 (L)  0.3 - 0.8 10*9/L Final    Absolute Eosinophils 12/06/2022 0.1  0.0 - 0.5 10*9/L Final    Absolute Basophils 12/06/2022 0.0  0.0 - 0.1 10*9/L Final    Anisocytosis 12/06/2022 Slight (A)  Not Present Final

## 2022-12-06 NOTE — Unmapped (Signed)
Patient arrived at 1145 for plt transfusion. Patient tolerated first transfusion w/o any complications, plt re-count was 30 and per order pt does not need to receive another unit of plts. Patient discharged in stable condition w/ assistive device.

## 2022-12-06 NOTE — Unmapped (Signed)
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 He reports he feels well. Fatigue continues to be the biggest issue but stays active and walks daily. He had one episode of orthostatic hypotension in the infusion waiting room earlier this week but reports it was an isolated episode that has not occurred since. He is eating and drinking well and reports his bowel movements are at his baseline (he struggles with occasional diarrhea/constipation although he has been managing this for years). Denies nausea, fevers or bleeding. Appropriately taking all medications. Discussed initiation of voriconazole and dose reduction of venetoclax with patient today.     Labs: WBC 1.4, ANC 0.4, Hgb 9.6, PLT 10; CMP WNL  Vitals: BP 155/80; HR 86    Oncologic History:  Oncology History Overview Note   Referring/Local Oncologist:  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      This electronic signature is attestation that the pathologist personally reviewed the submitted material(s) and the final diagnosis reflects that evaluation. 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 11/04/18)  A Randomized Study of Daratumumab Plus Lenalidomide Versus Lenalidomide Alone as Maintenance Treatment in Patients with Newly Diagnosed Multiple Myeloma Who Are Minimal Residual Disease Positive After Frontline Autologous Stem Cell Transplant.     History of auto stem cell transplant (CMS-HCC)   08/05/2019 Initial Diagnosis    History of auto stem cell transplant (CMS-HCC)     10/22/2019 - 06/27/2022 Chemotherapy    STUDY [id number] IRB# [id number] LENALIDOMIDE (v. 11/04/18)  A Randomized Study of Daratumumab Plus Lenalidomide Versus Lenalidomide Alone as Maintenance Treatment in Patients with Newly Diagnosed Multiple Myeloma Who Are Minimal Residual Disease Positive After Frontline Autologous Stem Cell Transplant.  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)         Weight and Vitals:  Wt Readings from Last 3 Encounters:   11/29/22 (!) 122.6 kg (270 lb 4.5 oz)   11/24/22 (!) 124.2 kg (273 lb 13 oz)   11/22/22 (!) 122.6 kg (270 lb 4.5 oz)     Temp Readings from Last 3 Encounters:   12/03/22 36.9 ??C (98.4 ??F) (Oral)   11/29/22 36.4 ??C (97.6 ??F) (Oral)   11/29/22 36.8 ??C (98.2 ??F) (Temporal)     BP Readings from Last 3 Encounters:   12/03/22 158/79   11/29/22 142/68   11/29/22 142/79     Pulse Readings from Last 3 Encounters:   12/03/22 100   11/29/22 97   11/29/22 97       Pertinent Labs:  No visits with results within 1 Day(s) from this visit.   Latest known visit with results is:   Hospital Outpatient Visit on 12/03/2022   Component Date Value Ref Range Status    ABO Grouping 12/03/2022 A POS   Final    Antibody Screen 12/03/2022 NEG   Final    WBC 12/03/2022 1.2 (L)  3.6 - 11.2 10*9/L Final    RBC 12/03/2022 2.49 (L)  4.26 - 5.60 10*12/L Final    HGB 12/03/2022 7.9 (L)  12.9 - 16.5 g/dL Final    HCT 16/08/9603 22.1 (L)  39.0 - 48.0 % Final    MCV 12/03/2022 88.6  77.6 - 95.7 fL Final    MCH 12/03/2022 31.7  25.9 - 32.4 pg Final    MCHC 12/03/2022 35.8  32.0 - 36.0 g/dL Final    RDW 54/07/8118 16.7 (H)  12.2 - 15.2 % Final    MPV 12/03/2022 8.3  6.8 - 10.7 fL Final    Platelet 12/03/2022 13 (L)  150 - 450 10*9/L Final    Neutrophils % 12/03/2022 14.3  % Final    Lymphocytes % 12/03/2022 67.8  % Final    Monocytes % 12/03/2022 17.2  % Final    Eosinophils % 12/03/2022 0.0  % Final    Basophils % 12/03/2022 0.7  % Final    Absolute Neutrophils 12/03/2022 0.2 (LL)  1.8 - 7.8 10*9/L Final    Absolute Lymphocytes 12/03/2022 0.8 (L)  1.1 - 3.6 10*9/L Final    Absolute Monocytes 12/03/2022 0.2 (L)  0.3 - 0.8 10*9/L Final    Absolute Eosinophils 12/03/2022 0.0  0.0 - 0.5 10*9/L Final    Absolute Basophils 12/03/2022 0.0  0.0 - 0.1 10*9/L Final    Anisocytosis 12/03/2022 Slight (A)  Not Present Final    Crossmatch 12/04/2022 Compatible   Final    Unit Blood Type 12/04/2022 A Pos   Final    ISBT Number 12/04/2022 6200   Final    Unit # 12/04/2022 [id number]   Final    Status 12/04/2022 Transfused   Final    Product ID 12/04/2022 Red Blood Cells   Final    PRODUCT CODE 12/04/2022 [id number]   Final    Crossmatch 12/04/2022 Compatible   Final    Unit Blood Type 12/04/2022 A Neg   Final    ISBT Number 12/04/2022 0600   Final    Unit # 12/04/2022 [id number]   Final    Status 12/04/2022 Transfused   Final    Product ID 12/04/2022 Red Blood Cells  Final    PRODUCT CODE 12/04/2022 [id number]   Final       Allergies: No Known Allergies    Drug Interactions: would need to adjust venetoclax with strong/moderate 3A4 and Pgp inhibitors; avoid strong 3A4 inducers; must separate from Pgp substrates; target dose of venetoclax with concurrent voriconazole is 100 mg daily. Patient verbalized understanding of these medication adjustments. With start of voriconazole, caution with sildenafil (PRN for ED though he's not been using recently) and monitor for orthostasis with tamsulosin. Diltiazem was discontinued prior to treatment start to avoid DDI.     Current Medications:  Current Outpatient Medications   Medication Sig Dispense Refill    allopurinol (ZYLOPRIM) 300 MG tablet Take 1 tablet (300 mg total) by mouth daily. 30 tablet 2    calcium carbonate (TUMS) 200 mg calcium (500 mg) chewable tablet Chew 2 tablets (400 mg of elem calcium total) Three (3) times a day.      cholecalciferol, vitamin D3 25 mcg, 1,000 units,, 1,000 unit (25 mcg) tablet Take 1 tablet (25 mcg total) by mouth.      cyanocobalamin, vitamin B-12, 2,000 mcg Tab Take 2,000 mcg by mouth in the morning. 90 tablet 3    docusate sodium (COLACE) 50 MG capsule Take 1 capsule (50 mg total) by mouth two (2) times a day.      fluticasone propionate (FLONASE) 50 mcg/actuation nasal spray 1 spray into each nostril daily as needed.      levoFLOXacin (LEVAQUIN) 500 MG tablet Take 1 tablet (500 mg total) by mouth daily. 30 tablet 1    loratadine (CLARITIN) 10 mg tablet Take 1 tablet (10 mg total) by mouth daily.      metoprolol succinate (TOPROL-XL) 50 MG 24 hr tablet Take 1 tablet (50 mg total) by mouth.      multivitamin with minerals tablet Take 1 tablet by mouth daily.      omeprazole (PRILOSEC) 40 MG capsule Take 1 capsule (40 mg total) by mouth daily. 90 capsule 0 ondansetron (ZOFRAN) 8 MG tablet Take 1 tablet (8 mg total) by mouth every eight (8) hours as needed for nausea. 30 tablet 2    sildenafiL, pulm.hypertension, (REVATIO) 20 mg tablet Take 1-5 tablets (20-100 mg total) by mouth daily as needed (for erectile function). 90 tablet 3    tamsulosin (FLOMAX) 0.4 mg capsule Take 1 capsule (0.4 mg total) by mouth daily. 90 capsule 0    venetoclax (VENCLEXTA) 100 mg tablet Take 4 tablets (400 mg total) by mouth daily. Take with a meal and water. Do not chew, crush, or break tablets. 120 tablet 1     No current facility-administered medications for this visit.     Facility-Administered Medications Ordered in Other Visits   Medication Dose Route Frequency Provider Last Rate Last Admin    DTaP-hepatitis B recombinant-IPV (PEDIARIX) 10 mcg-25Lf-25 mcg-10Lf/0.5 mL injection             DTaP-hepatitis B recombinant-IPV (PEDIARIX) 10 mcg-25Lf-25 mcg-10Lf/0.5 mL injection             haemophilus B polysac-tetanus toxoid (ActHIB) 10 mcg/0.5 mL injection             influenza vaccine quad (FLUARIX, FLULAVAL, FLUZONE) (6 MOS & UP) 2020-21 ADS Med             pneumococcal conjugate (13-valent) (PREVNAR-13) 0.5 mL vaccine             pneumococcal conjugate (13-valent) (PREVNAR-13) 0.5 mL vaccine  varicella-zoster gE-AS01B (PF) (SHINGRIX) 50 mcg/0.5 mL injection                Adherence: no barriers identified      Assessment: TimothyTimothy Porter is a 77 y.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 He is feeling well. ANC 0.4 today, will start voriconazole and dose reduce venetoclax to 100 mg daily today.     Plan:   1) AML  -Continue with B-AML S12 azacitidine/venetoclax C1D15  -DECREASE Venetoclax from 400 mg daily to 100 mg daily (for 28 day duration) due to DDI with voriconazole  -Twice weekly labs/transfusions  -C1 marrow on 1/17, [name] on 1/24    2) TLS prophylaxis  -Continue allopurinol 300 mg PO daily    3) Infection prophylaxis  -Continue levofloxacin 500 mg PO daily  -Continue valacyclovir 500 mg PO daily  -START voriconazole 200 mg PO BID given ANC < 0.5. Rx sent to [facility] for $10 copay. Venetoclax rx adjusted to account for DDI.     4) CV  -Continue OFF off diltiazem  -Continue metoprolol succinate 50 mg once daily, can titrate as needed for BP/HR  -Follows with [facility]    5) Supportive care  -Continue ondansetron 8 mg up to every 8 hours prn nausea/vomiting    F/u:  Future Appointments   Date Time Provider Department Center   12/06/2022  8:15 AM ADULT ONC LAB [facility]   12/06/2022  9:00 AM [name] Gerlach, AGNP [facility]   12/06/2022 10:00 AM ONCHEM LEUKEMIA PHARMACIST [facility]   12/06/2022 11:30 AM ONCINF CHAIR 25 [facility]   12/10/2022  9:30 AM ONCINF CHAIR 11 [facility]   12/13/2022  7:45 AM ADULT ONC LAB [facility]   12/13/2022  9:00 AM ONCINF CHAIR 20 [facility]   12/13/2022  9:00 AM Lenon Ahmadi, AGNP [facility]   12/17/2022  9:30 AM ONCINF CHAIR 11 [facility]   12/20/2022 12:00 PM ADULT ONC LAB [facility]   12/20/2022  1:00 PM Doreatha Lew, MD [facility]   12/20/2022  2:30 PM ONCINF CHAIR 35 [facility]   12/21/2022  2:00 PM ONCINF CHAIR 16 [facility]   12/22/2022  2:00 PM ONCINF CHAIR 26 [facility]   12/23/2022  8:30 AM ONCINF CHAIR 02 [facility]   12/24/2022  8:30 AM ONCINF CHAIR 02 [facility]   12/25/2022  2:00 PM ONCINF CHAIR 34 [facility]   12/26/2022  2:00 PM ONCINF CHAIR 01 [facility]   02/13/2023  4:00 PM Ruthell Rummage, MD [facility]       I spent 20 minutes with TimothyTimothy Porter in direct patient care.     Erling Conte, PharmD  PGY-2 Oncology Pharmacy Resident       Manfred Arch, PharmD, BCOP, CPP  Pager: [phone]

## 2022-12-06 NOTE — Unmapped (Signed)
Today let's START voriconazole, which is an antifungal. You'll take 1 tablet (200 mg) TWICE daily.    Starting today, please DOSE REDUCE venetoclax (your chemo pill) from 4 tablets once daily to just 1 tablet (100 mg) once daily with.       Lab on 12/06/2022   Component Date Value Ref Range Status    Sodium 12/06/2022 141  135 - 145 mmol/L Final    Potassium 12/06/2022 3.5  3.4 - 4.8 mmol/L Final    Chloride 12/06/2022 110 (H)  98 - 107 mmol/L Final    CO2 12/06/2022 26.0  20.0 - 31.0 mmol/L Final    Anion Gap 12/06/2022 5  5 - 14 mmol/L Final    BUN 12/06/2022 17  9 - 23 mg/dL Final    Creatinine 08/65/7846 0.88  0.73 - 1.18 mg/dL Final    BUN/Creatinine Ratio 12/06/2022 19   Final    eGFR CKD-EPI (2021) Male 12/06/2022 90  >=60 mL/min/1.65m2 Final    eGFR calculated with CKD-EPI 2021 equation in accordance with [employer] and [employer] of Nephrology Task Force recommendations.    Glucose 12/06/2022 130  70 - 179 mg/dL Final    Calcium 96/29/5284 8.5 (L)  8.7 - 10.4 mg/dL Final    Albumin 13/24/4010 3.5  3.4 - 5.0 g/dL Final    Total Protein 12/06/2022 6.5  5.7 - 8.2 g/dL Final    Total Bilirubin 12/06/2022 1.2  0.3 - 1.2 mg/dL Final    AST 27/25/3664 23  <=34 U/L Final    ALT 12/06/2022 19  10 - 49 U/L Final    Alkaline Phosphatase 12/06/2022 110  46 - 116 U/L Final    Magnesium 12/06/2022 1.7  1.6 - 2.6 mg/dL Final    Phosphorus 40/34/7425 2.7  2.4 - 5.1 mg/dL Final    LDH 95/63/8756 209  120 - 246 U/L Final    Uric Acid 12/06/2022 2.1 (L)  3.7 - 9.2 mg/dL Final    WBC 43/32/9518 1.4 (L)  3.6 - 11.2 10*9/L Final    RBC 12/06/2022 3.07 (L)  4.26 - 5.60 10*12/L Final    HGB 12/06/2022 9.6 (L)  12.9 - 16.5 g/dL Final    HCT 84/16/6063 26.9 (L)  39.0 - 48.0 % Final    MCV 12/06/2022 87.5  77.6 - 95.7 fL Final    MCH 12/06/2022 31.2  25.9 - 32.4 pg Final    MCHC 12/06/2022 35.6  32.0 - 36.0 g/dL Final    RDW 01/60/1093 16.2 (H)  12.2 - 15.2 % Final    MPV 12/06/2022 9.1  6.8 - 10.7 fL Final Platelet 12/06/2022 10 (L)  150 - 450 10*9/L Final    Questionable result confirmed. Specimen integrity/identity confirmed.      Neutrophils % 12/06/2022 26.2  % Final    Lymphocytes % 12/06/2022 63.6  % Final    Monocytes % 12/06/2022 1.1  % Final    Eosinophils % 12/06/2022 7.0  % Final    Basophils % 12/06/2022 2.1  % Final    Absolute Neutrophils 12/06/2022 0.4 (LL)  1.8 - 7.8 10*9/L Final    Absolute Lymphocytes 12/06/2022 0.9 (L)  1.1 - 3.6 10*9/L Final    Absolute Monocytes 12/06/2022 0.0 (L)  0.3 - 0.8 10*9/L Final    Absolute Eosinophils 12/06/2022 0.1  0.0 - 0.5 10*9/L Final    Absolute Basophils 12/06/2022 0.0  0.0 - 0.1 10*9/L Final    Anisocytosis 12/06/2022 Slight (A)  Not  Present Final

## 2022-12-06 NOTE — Unmapped (Cosign Needed)
Clinical Study: BAML-16-001-S12: A Randomized Phasae 2 Trial of 28 Day (Arm A) versus 14 Day (Arm B) Schedule of Venetoclax (Ven) + Azacitidine (Aza) in newly diagnosed acute myloid leukemia (AML) patients > 60 years       Cycle / Day: Cycle 1 Day 15  DOS: 12/06/22  Performance Status: 1  Subject #: [id number]  Cohort / Treatment: 28 days Venetoclax.    Narrative: Patient in clinic today for cycle 1 day 15 with Oliver Hum.Patient reports dizziness this past Sunday in infusion with high blood pressure spiking to 213/102,  It came down after awhile.  Patient reports no dizzy spells since then. Patient continuing to take allopurinol to assist kidneys. Patient will get platelets today.  Will reach out to pharmacy team to start an antifungal. If we start antifungal  we will have to dose reduce venetoclax. Will return for cycle 1 bone marrow biopsy on 12/13/21. Patient will need additional  transfusion days added on Saturdays or Sundays. Patient reports feeling pretty good today no new nausea or vomiting. Appetite is ok, no new shortness of breath.  Patient reports flaky skin. Physical exam performed.       Medical / Surgical History  Medical History Start Date End Date Grade Related to Prior Therapy? (Y / N)   Benign prostatic hyperplasia with lower urinary tract symptoms        BPH (benign prostatic hyperplasia)        Diarrhea       fatigue       Left arm fractures       Glucose intolerance 03/2018      H/O autologous stem cell transplant (CMS-HCC)        History of Atrial Fibrillation 03/15/2021      Hypertension       Multiple myeloma       prediabetes              BASELINE LABS       Anemia 11/15/22  3 No   White blood cell decrease 11/15/22  2 No   Platelet count decrease 11/15/22  3 No   Neutrophil count decrease 11/15/22  3 No   Lymphocyte cell decrease 11/15/22  1 No                           Oral Study Medication Compliance  Oral Medication # of Pills Dispensed # of Pills Taken Expected Returned Actual Returned % Compliance   Venetoclax 28               Date and reason for any missed dose(s):    Previous diary returned: No- patient given first cycle 1 diary on 11/22/22  Reason for discrepancies between diary and pill count:   New diary issued: No- Yes. Patient diary was given on 11/22/22  Educational and/or other handouts issued:  Patient education performed: Patient saw pharmacists team to go over oral medications.      Research Plan:  Patient will receive platelets today on 12/06/22 and will return to clinic on 12/13/22 for end of cycle 1 bone marrow biopsy. Request BMBX provider to perform ECOG and physical exam.            Adverse Events   AE Start Date End Date Grade Attribution Clinically Significant?  (N / Y: Action Taken)   Fatigue 11/22/22  1 Not related N   Low appetite 11/22/22  1 Not related N  Toe infecton 11/22/22  1 Not related N   Vertigo 12/02/22   Not related N           White blood cell count decrease 11/22/22  3 Not related N   Anemia 11/22/22 11/25/22 2 Not related N   Anemia 11/25/22 11/29/22 3 Not related N   Anemia 11/29/22 12/03/22 2 Not related N   Anemia 12/03/22 12/06/22 3 Not related N   Anemia 12/06/22  2 Not related N   Lymphocyte decrease 11/25/22  2 Not related N   Platelet count decrease 11/22/22  4 Not related N   Neutrophil count decrease 11/22/22  3 Not related N                                   All lab values and assessments were reviewed by the investigator and are considered not clinically significant unless otherwise noted.    Concomitant Medications   Medication Dose Start Date End Date Indication Related AE, if any   Calcium Carbonate 400mg  7/3/202  2 tablets for calcium    Cholecalciferol 25 mcg 09/20/20  1 tablet for vitamin D    Cholestyramine 4 grams 07/21/20  4grams in water for diarrhea    B12 2,000 mcg 04/04/22  2,000 mcg by mouth everyday    Diltiazem (Cardizem) 120 Mg   1 tablet by mouth    Tiazec 120 mg   1 tablet by mouth    Ferrous sulfate 325 mg  11/22/22 1 tablet by mouth Flonase 50 mcg   Intranasal 1x a day    Claritin 10 mg   1 tablet by mouth    Toprol 50 mg   1 tablet by mouth    omeprazole 40 mg   1 tablet by mouth    Revatio 20 mg   1-5 tablets as needed    Tamsulosin .4mg    1 tablet by mouth as needed    Trazodone 50 mg  11/22/22 1 tablet by mouth Not been using   Valtrex 500 mg   1 tablet by mouth    Study Medication        Azacitidine        Venetoclax

## 2022-12-09 NOTE — Unmapped (Signed)
Clinical Assessment Needed For: Dose Change  Medication: Venclexta 100 mg tablets  Last Fill Date/Day Supply: 11/17/22 / 30  Refill Too Soon until 12/09/22  Was previous dose already scheduled to fill: No    Notes to Pharmacist:

## 2022-12-10 ENCOUNTER — Encounter
Admit: 2022-12-10 | Discharge: 2022-12-11 | Payer: MEDICARE | Attending: Nurse Practitioner | Primary: Nurse Practitioner

## 2022-12-10 ENCOUNTER — Ambulatory Visit: Admit: 2022-12-10 | Discharge: 2022-12-11 | Payer: MEDICARE

## 2022-12-10 LAB — CBC W/ AUTO DIFF
BASOPHILS ABSOLUTE COUNT: 0 10*9/L (ref 0.0–0.1)
BASOPHILS RELATIVE PERCENT: 1.6 %
EOSINOPHILS ABSOLUTE COUNT: 0 10*9/L (ref 0.0–0.5)
EOSINOPHILS RELATIVE PERCENT: 1.5 %
HEMATOCRIT: 22.8 % — ABNORMAL LOW (ref 39.0–48.0)
HEMOGLOBIN: 7.9 g/dL — ABNORMAL LOW (ref 12.9–16.5)
LYMPHOCYTES ABSOLUTE COUNT: 0.4 10*9/L — ABNORMAL LOW (ref 1.1–3.6)
LYMPHOCYTES RELATIVE PERCENT: 53.7 %
MEAN CORPUSCULAR HEMOGLOBIN CONC: 34.7 g/dL (ref 32.0–36.0)
MEAN CORPUSCULAR HEMOGLOBIN: 30.8 pg (ref 25.9–32.4)
MEAN CORPUSCULAR VOLUME: 88.8 fL (ref 77.6–95.7)
MEAN PLATELET VOLUME: 8.9 fL (ref 6.8–10.7)
MONOCYTES ABSOLUTE COUNT: 0 10*9/L — ABNORMAL LOW (ref 0.3–0.8)
MONOCYTES RELATIVE PERCENT: 0.3 %
NEUTROPHILS ABSOLUTE COUNT: 0.3 10*9/L — CL (ref 1.8–7.8)
NEUTROPHILS RELATIVE PERCENT: 42.9 %
PLATELET COUNT: 10 10*9/L — ABNORMAL LOW (ref 150–450)
RED BLOOD CELL COUNT: 2.57 10*12/L — ABNORMAL LOW (ref 4.26–5.60)
RED CELL DISTRIBUTION WIDTH: 16.1 % — ABNORMAL HIGH (ref 12.2–15.2)
WBC ADJUSTED: 0.7 10*9/L — ABNORMAL LOW (ref 3.6–11.2)

## 2022-12-10 LAB — PLATELET COUNT: PLATELET COUNT: 34 10*9/L — ABNORMAL LOW (ref 150–450)

## 2022-12-10 LAB — SLIDE REVIEW

## 2022-12-10 NOTE — Unmapped (Signed)
Patient arrived to chair 10.  1 unit of platelets given with recheck of 34.  2 units of packed red blood cells given for hemoglobin of 7.9.  PIV removed.  Patient ambulated off unit in stable condition.

## 2022-12-10 NOTE — Unmapped (Signed)
Therapy Update Follow Up: No issues - Copay = $0

## 2022-12-13 ENCOUNTER — Ambulatory Visit: Admit: 2022-12-13 | Discharge: 2022-12-14 | Payer: MEDICARE

## 2022-12-13 ENCOUNTER — Encounter: Admit: 2022-12-13 | Discharge: 2022-12-14 | Payer: MEDICARE

## 2022-12-13 ENCOUNTER — Other Ambulatory Visit: Admit: 2022-12-13 | Discharge: 2022-12-14 | Payer: MEDICARE

## 2022-12-13 DIAGNOSIS — C9200 Acute myeloblastic leukemia, not having achieved remission: Principal | ICD-10-CM

## 2022-12-13 LAB — COMPREHENSIVE METABOLIC PANEL
ALBUMIN: 3.6 g/dL (ref 3.4–5.0)
ALKALINE PHOSPHATASE: 115 U/L (ref 46–116)
ALT (SGPT): 21 U/L (ref 10–49)
ANION GAP: 6 mmol/L (ref 5–14)
AST (SGOT): 27 U/L (ref <=34)
BILIRUBIN TOTAL: 1.1 mg/dL (ref 0.3–1.2)
BLOOD UREA NITROGEN: 22 mg/dL (ref 9–23)
BUN / CREAT RATIO: 26
CALCIUM: 7.8 mg/dL — ABNORMAL LOW (ref 8.7–10.4)
CHLORIDE: 109 mmol/L — ABNORMAL HIGH (ref 98–107)
CO2: 25 mmol/L (ref 20.0–31.0)
CREATININE: 0.84 mg/dL
EGFR CKD-EPI (2021) MALE: >90 mL/min/{1.73_m2} (ref >=60)
GLUCOSE RANDOM: 117 mg/dL (ref 70–179)
POTASSIUM: 3.7 mmol/L (ref 3.4–4.8)
PROTEIN TOTAL: 6.9 g/dL (ref 5.7–8.2)
SODIUM: 140 mmol/L (ref 135–145)

## 2022-12-13 LAB — CBC W/ AUTO DIFF
BASOPHILS ABSOLUTE COUNT: 0 10*9/L (ref 0.0–0.1)
BASOPHILS RELATIVE PERCENT: 1.5 %
EOSINOPHILS ABSOLUTE COUNT: 0 10*9/L (ref 0.0–0.5)
EOSINOPHILS RELATIVE PERCENT: 0.8 %
HEMATOCRIT: 27.6 % — ABNORMAL LOW (ref 39.0–48.0)
HEMOGLOBIN: 9.8 g/dL — ABNORMAL LOW (ref 12.9–16.5)
LYMPHOCYTES ABSOLUTE COUNT: 0.7 10*9/L — ABNORMAL LOW (ref 1.1–3.6)
LYMPHOCYTES RELATIVE PERCENT: 79.4 %
MEAN CORPUSCULAR HEMOGLOBIN CONC: 35.5 g/dL (ref 32.0–36.0)
MEAN CORPUSCULAR HEMOGLOBIN: 30.8 pg (ref 25.9–32.4)
MEAN CORPUSCULAR VOLUME: 86.8 fL (ref 77.6–95.7)
MEAN PLATELET VOLUME: 7.5 fL (ref 6.8–10.7)
MONOCYTES ABSOLUTE COUNT: 0 10*9/L — ABNORMAL LOW (ref 0.3–0.8)
MONOCYTES RELATIVE PERCENT: 0.3 %
NEUTROPHILS ABSOLUTE COUNT: 0.2 10*9/L — CL (ref 1.8–7.8)
NEUTROPHILS RELATIVE PERCENT: 18 %
PLATELET COUNT: 28 10*9/L — ABNORMAL LOW (ref 150–450)
RED BLOOD CELL COUNT: 3.18 10*12/L — ABNORMAL LOW (ref 4.26–5.60)
RED CELL DISTRIBUTION WIDTH: 15.8 % — ABNORMAL HIGH (ref 12.2–15.2)
WBC ADJUSTED: 0.9 10*9/L — ABNORMAL LOW (ref 3.6–11.2)

## 2022-12-13 LAB — SLIDE REVIEW

## 2022-12-13 LAB — LACTATE DEHYDROGENASE: LACTATE DEHYDROGENASE: 252 U/L — ABNORMAL HIGH (ref 120–246)

## 2022-12-13 LAB — SMEAR - BONE MARROW PATIENT

## 2022-12-13 LAB — MAGNESIUM: MAGNESIUM: 1.8 mg/dL (ref 1.6–2.6)

## 2022-12-13 LAB — URIC ACID: URIC ACID: 2.6 mg/dL — ABNORMAL LOW

## 2022-12-13 NOTE — Unmapped (Unsigned)
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 All questions were answered. The patient verbalized understanding and signed informed consent. After a time-out in which his patient identifiers were checked by 2 providers, the patient was laid in prone position on the table.   The  posterior superior iliac spine and iliac crest were cleaned, prepped and draped in the usual sterile fashion.     Anesthetic agent used: 2% plain lidocaine.      Utilizing a Ranfac needle, a bone marrow aspiration and biopsy was performed.  Specimen was sent for routine histopathologic stains and sectioning, flow cytometry, cytogenetics, and molecular analysis.     A pressure dressing was applied to the biopsy site.  Patient tolerated the procedure well.  Hemostasis was confirmed upon discharge.     The patient was given verbal instructions for wound care, such as to keep the biopsy site dry, covered for 24 hours, and to call your physician for a temperature > 100.5.  Tylenol may be taken for discomfort.    Specimens Collected:  EDTA x 2  Heparin x 2  Core biopsy x 2  ACD x 1

## 2022-12-13 NOTE — Unmapped (Unsigned)
0930: Pt in clinic today for BMBX. Labs reviewed and consent obtained by NP.     Time out completed.

## 2022-12-13 NOTE — Unmapped (Signed)
Please leave dressing in place and keep it dry for 24 hrs before removing. You can resume normal activities tomorrow, but take things easy today.     You may take tylenol as needed for discomfort at the area where the biopsy was taken.   After receiving Ativan, please do not drive today.     If you have questions between 8am to 5 pm Monday through Friday please call [phone] and speak to the operator.      For emergencies, evenings or weekends, please call [phone] and ask for oncology fellow on call.     Reasons to call emergency line may include:   Fever of 100.5 or greater   Nausea and/or vomiting not relieved with nausea medicine   Diarrhea or constipation   Severe pain not relieved with usual pain regimen       Lab on 12/13/2022   Component Date Value Ref Range Status    LDH 12/13/2022 252 (H)  120 - 246 U/L Final    Uric Acid 12/13/2022 2.6 (L)  3.7 - 9.2 mg/dL Final    Magnesium 29/56/2130 1.8  1.6 - 2.6 mg/dL Final    Sodium 86/57/8469 140  135 - 145 mmol/L Final    Potassium 12/13/2022 3.7  3.4 - 4.8 mmol/L Final    Chloride 12/13/2022 109 (H)  98 - 107 mmol/L Final    CO2 12/13/2022 25.0  20.0 - 31.0 mmol/L Final    Anion Gap 12/13/2022 6  5 - 14 mmol/L Final    BUN 12/13/2022 22  9 - 23 mg/dL Final    Creatinine 62/95/2841 0.84  0.73 - 1.18 mg/dL Final    BUN/Creatinine Ratio 12/13/2022 26   Final    eGFR CKD-EPI (2021) Male 12/13/2022 >90  >=60 mL/min/1.85m2 Final    eGFR calculated with CKD-EPI 2021 equation in accordance with [employer] and [employer] of Nephrology Task Force recommendations.    Glucose 12/13/2022 117  70 - 179 mg/dL Final    Calcium 32/44/0102 7.8 (L)  8.7 - 10.4 mg/dL Final    Albumin 72/53/6644 3.6  3.4 - 5.0 g/dL Final    Total Protein 12/13/2022 6.9  5.7 - 8.2 g/dL Final    Total Bilirubin 12/13/2022 1.1  0.3 - 1.2 mg/dL Final    AST 03/47/4259 27  <=34 U/L Final    ALT 12/13/2022 21  10 - 49 U/L Final    Alkaline Phosphatase 12/13/2022 115  46 - 116 U/L Final    WBC 12/13/2022 0.9 (L)  3.6 - 11.2 10*9/L Preliminary    RBC 12/13/2022 3.18 (L)  4.26 - 5.60 10*12/L Preliminary    HGB 12/13/2022 9.8 (L)  12.9 - 16.5 g/dL Preliminary    HCT 56/38/7564 27.6 (L)  39.0 - 48.0 % Preliminary    MCV 12/13/2022 86.8  77.6 - 95.7 fL Preliminary    MCH 12/13/2022 30.8  25.9 - 32.4 pg Preliminary    MCHC 12/13/2022 35.5  32.0 - 36.0 g/dL Preliminary    RDW 33/29/5188 15.8 (H)  12.2 - 15.2 % Preliminary    MPV 12/13/2022 7.5  6.8 - 10.7 fL Preliminary    Platelet 12/13/2022 28 (L)  150 - 450 10*9/L Preliminary    Questionable result confirmed. Specimen integrity/identity confirmed.

## 2022-12-14 NOTE — Unmapped (Signed)
Timothy Porter has been contacted in regards to their refill of Venetoclax. At this time, they have declined refill due to patient having close to 60 doses remaining. Medication was dose reduced due to initiation of Voriconazole. Refill assessment call date has been updated per the patient's request.    Horace Porteous, PharmD  [facility]

## 2022-12-15 IMAGING — DX DG HIP (WITH OR WITHOUT PELVIS) 1V PORT*L*
2 series · 2 of 2 positions shown · non-contrast
Comparison: None

CLINICAL DATA: Left hip arthroplasty

EXAM:
DG HIP (WITH OR WITHOUT PELVIS) 1V PORT LEFT

[pelvis ap]
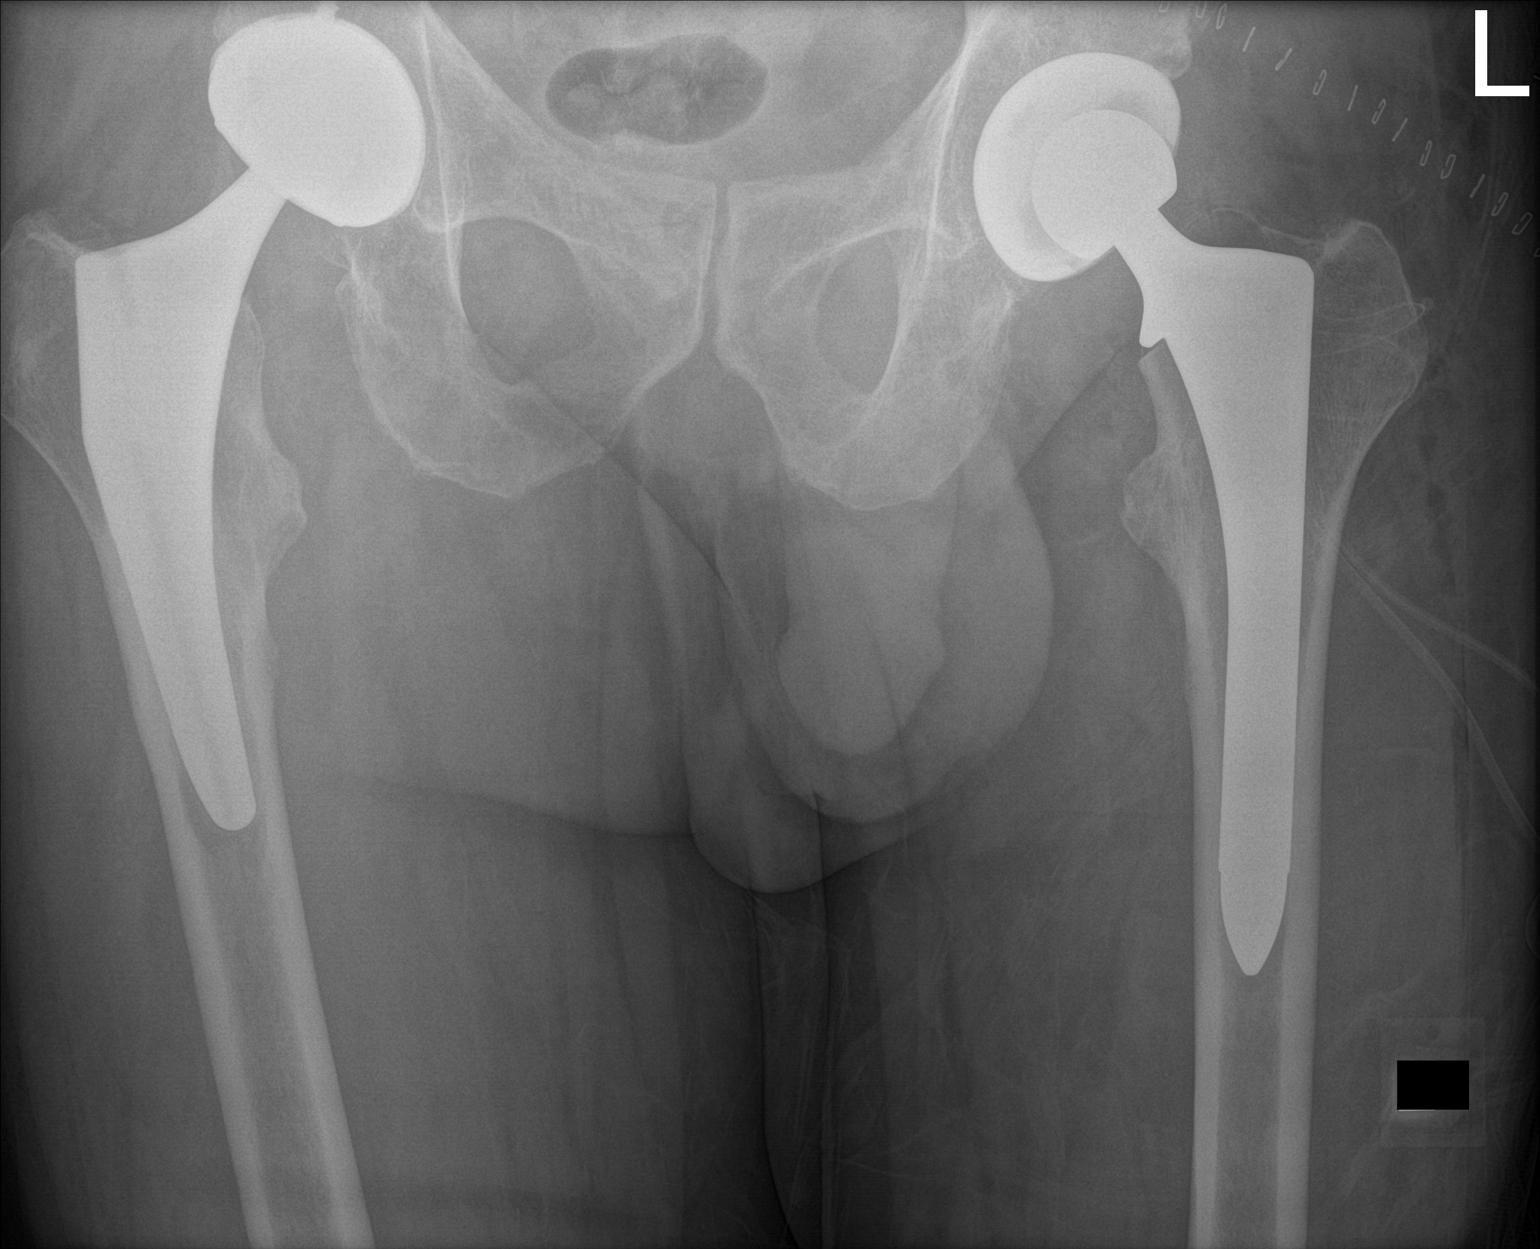

[hip lat]
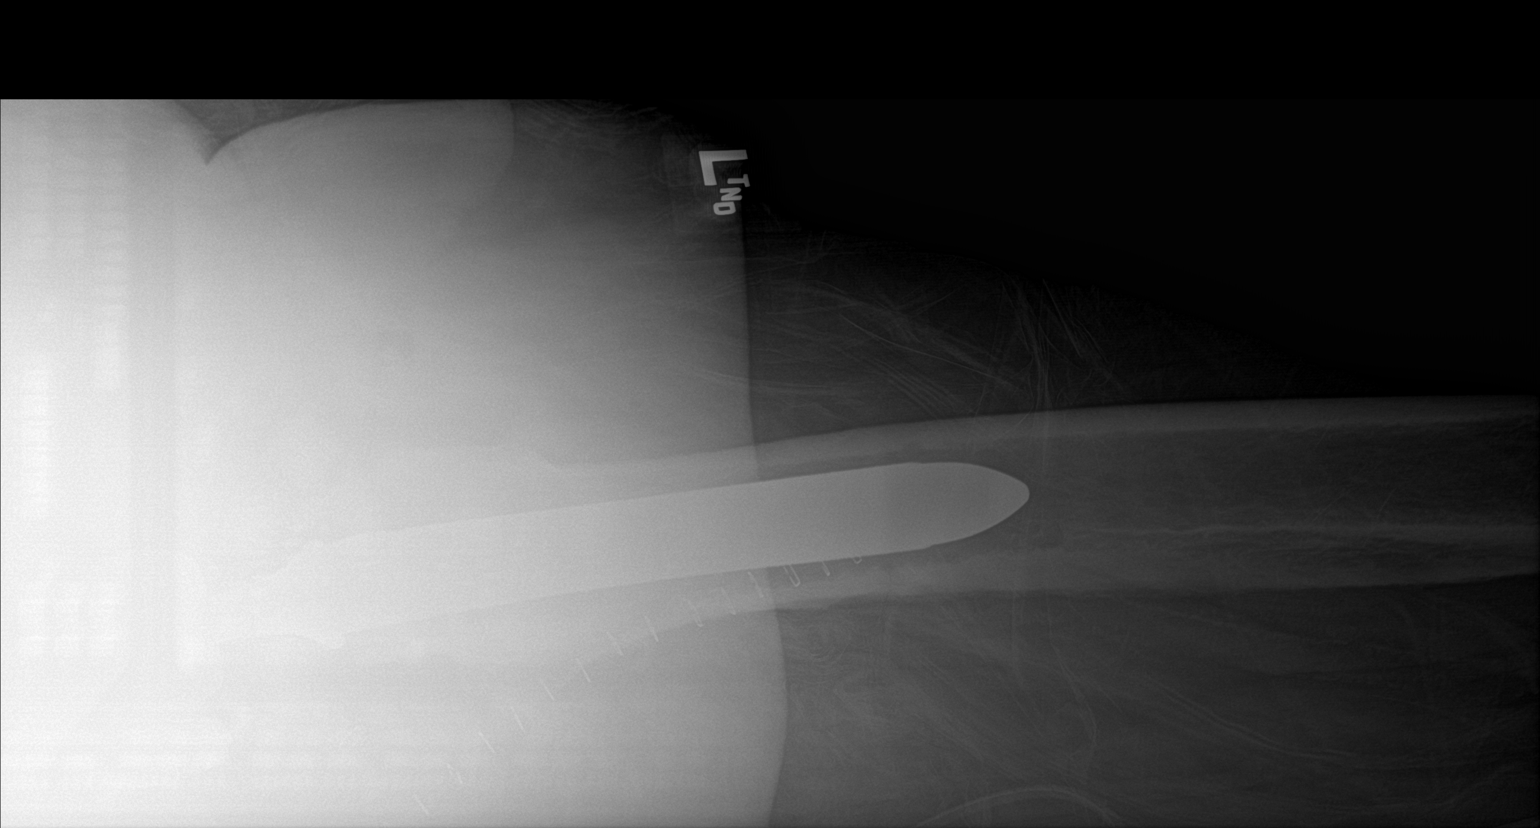

[2 of 2 positions shown; findings below may reference images not displayed]

FINDINGS: There is evidence of recent left hip arthroplasty. There are pockets
of air in the soft tissues. Skin staples are seen. There is previous
right hip arthroplasty.
IMPRESSION: Status post left hip arthroplasty.

## 2022-12-17 ENCOUNTER — Encounter
Admit: 2022-12-17 | Discharge: 2022-12-18 | Payer: MEDICARE | Attending: Nurse Practitioner | Primary: Nurse Practitioner

## 2022-12-17 ENCOUNTER — Ambulatory Visit: Admit: 2022-12-17 | Discharge: 2022-12-18 | Payer: MEDICARE

## 2022-12-17 LAB — CBC W/ AUTO DIFF
BASOPHILS ABSOLUTE COUNT: 0 10*9/L (ref 0.0–0.1)
BASOPHILS RELATIVE PERCENT: 0 %
EOSINOPHILS ABSOLUTE COUNT: 0 10*9/L (ref 0.0–0.5)
EOSINOPHILS RELATIVE PERCENT: 0.1 %
HEMATOCRIT: 25.2 % — ABNORMAL LOW (ref 39.0–48.0)
HEMOGLOBIN: 9.2 g/dL — ABNORMAL LOW (ref 12.9–16.5)
LYMPHOCYTES ABSOLUTE COUNT: 0.8 10*9/L — ABNORMAL LOW (ref 1.1–3.6)
LYMPHOCYTES RELATIVE PERCENT: 88.2 %
MEAN CORPUSCULAR HEMOGLOBIN CONC: 36.5 g/dL — ABNORMAL HIGH (ref 32.0–36.0)
MEAN CORPUSCULAR HEMOGLOBIN: 31.2 pg (ref 25.9–32.4)
MEAN CORPUSCULAR VOLUME: 85.7 fL (ref 77.6–95.7)
MEAN PLATELET VOLUME: 7.9 fL (ref 6.8–10.7)
MONOCYTES ABSOLUTE COUNT: 0 10*9/L — ABNORMAL LOW (ref 0.3–0.8)
MONOCYTES RELATIVE PERCENT: 3.4 %
NEUTROPHILS ABSOLUTE COUNT: 0.1 10*9/L — CL (ref 1.8–7.8)
NEUTROPHILS RELATIVE PERCENT: 8.3 %
PLATELET COUNT: 41 10*9/L — ABNORMAL LOW (ref 150–450)
RED BLOOD CELL COUNT: 2.94 10*12/L — ABNORMAL LOW (ref 4.26–5.60)
RED CELL DISTRIBUTION WIDTH: 15.8 % — ABNORMAL HIGH (ref 12.2–15.2)
WBC ADJUSTED: 0.9 10*9/L — ABNORMAL LOW (ref 3.6–11.2)

## 2022-12-17 LAB — SLIDE REVIEW

## 2022-12-17 NOTE — Unmapped (Signed)
Hospital Outpatient Visit on 12/17/2022   Component Date Value Ref Range Status    WBC 12/17/2022 0.9 (L)  3.6 - 11.2 10*9/L Preliminary    RBC 12/17/2022 2.94 (L)  4.26 - 5.60 10*12/L Preliminary    HGB 12/17/2022 9.2 (L)  12.9 - 16.5 g/dL Preliminary    HCT 45/40/9811 25.2 (L)  39.0 - 48.0 % Preliminary    MCV 12/17/2022 85.7  77.6 - 95.7 fL Preliminary    MCH 12/17/2022 31.2  25.9 - 32.4 pg Preliminary    MCHC 12/17/2022 36.5 (H)  32.0 - 36.0 g/dL Preliminary    RDW 91/47/8295 15.8 (H)  12.2 - 15.2 % Preliminary    MPV 12/17/2022 7.9  6.8 - 10.7 fL Preliminary    Platelet 12/17/2022 41 (L)  150 - 450 10*9/L Preliminary          RED ZONE Means: RED ZONE: Take action now!     You need to be seen right away  Symptoms are at a severe level of discomfort    Call 911 or go to your nearest  Hospital for help     - Bleeding that will not stop    - Hard to breathe    - New seizure - Chest pain  - Fall or passing out  -Thoughts of hurting    yourself or others      Call 911 if you are going into the RED ZONE                  YELLOW ZONE Means:     Please call with any new or worsening symptom(s), even if not on this list.  Call [phone]  After hours, weekends, and holidays - you will reach a long recording with specific instructions, If not in an emergency such as above, please listen closely all the way to the end and choose the option that relates to your need.   You can be seen by a provider the same day through our Same Day Acute Care for Patients with Cancer program.      YELLOW ZONE: Take action today     Symptoms are new or worsening  You are not within your goal range for:    - Pain    - Shortness of breath    - Bleeding (nose, urine, stool, wound)    - Feeling sick to your stomach and throwing up    - Mouth sores/pain in your mouth or throat    - Hard stool or very loose stools (increase in       ostomy output)    - No urine for 12 hours    - Feeding tube or other catheter/tube issue    - Redness or pain at previous IV or port/catheter site    - Depressed or anxiety   - Swelling (leg, arm, abdomen,     face, neck)  - Skin rash or skin changes  - Wound issues (redness, drainage,    re-opened)  - Confusion  - Vision changes  - Fever >100.4 F or chills  - Worsening cough with mucus that is    green, yellow, or bloody  - Pain or burning when going to the    bathroom  - Home Infusion Pump Issue- call    [phone]         Call your healthcare provider if you are going into the YELLOW ZONE     GREEN ZONE Means:  Your symptoms are under  controls  Continue to take your medicine as ordered  Keep all visits to the provider GREEN ZONE: You are in control  No increase or worsening symptoms  Able to take your medicine  Able to drink and eat    - DO NOT use MyChart messages to report red or yellow symptoms. Allow up to 3    business days for a reply.  -MyChart is for non-urgent medication refills, scheduling requests, or other general questions.         [id number] Rev. 05/26/2022  Approved by Oncology Patient Education Committee

## 2022-12-17 NOTE — Unmapped (Signed)
Patient came in for lab check and possible infusion. Patients Hemoglobin was 9.2 and Platelet count was 41. Per therapy plan no interventions needed. Patient was stable at discharge. Patient self-ambulated to the lobby.

## 2022-12-18 DIAGNOSIS — C9200 Acute myeloblastic leukemia, not having achieved remission: Principal | ICD-10-CM

## 2022-12-20 ENCOUNTER — Ambulatory Visit: Admit: 2022-12-20 | Discharge: 2022-12-20 | Payer: MEDICARE

## 2022-12-20 ENCOUNTER — Ambulatory Visit: Admit: 2022-12-20 | Discharge: 2022-12-20 | Payer: MEDICARE | Attending: Hematology | Primary: Hematology

## 2022-12-20 ENCOUNTER — Other Ambulatory Visit: Admit: 2022-12-20 | Discharge: 2022-12-20 | Payer: MEDICARE

## 2022-12-20 DIAGNOSIS — C9200 Acute myeloblastic leukemia, not having achieved remission: Principal | ICD-10-CM

## 2022-12-20 LAB — PHOSPHORUS: PHOSPHORUS: 3.2 mg/dL (ref 2.4–5.1)

## 2022-12-20 LAB — COMPREHENSIVE METABOLIC PANEL
ALBUMIN: 3.6 g/dL (ref 3.4–5.0)
ALKALINE PHOSPHATASE: 126 U/L — ABNORMAL HIGH (ref 46–116)
ALT (SGPT): 23 U/L (ref 10–49)
ANION GAP: 5 mmol/L (ref 5–14)
AST (SGOT): 27 U/L (ref <=34)
BILIRUBIN TOTAL: 0.8 mg/dL (ref 0.3–1.2)
BLOOD UREA NITROGEN: 27 mg/dL — ABNORMAL HIGH (ref 9–23)
BUN / CREAT RATIO: 27
CALCIUM: 8.6 mg/dL — ABNORMAL LOW (ref 8.7–10.4)
CHLORIDE: 103 mmol/L (ref 98–107)
CO2: 26 mmol/L (ref 20.0–31.0)
CREATININE: 0.99 mg/dL
EGFR CKD-EPI (2021) MALE: 79 mL/min/{1.73_m2} (ref >=60)
GLUCOSE RANDOM: 103 mg/dL (ref 70–179)
POTASSIUM: 3.5 mmol/L (ref 3.4–4.8)
PROTEIN TOTAL: 6.6 g/dL (ref 5.7–8.2)
SODIUM: 134 mmol/L — ABNORMAL LOW (ref 135–145)

## 2022-12-20 LAB — CBC W/ AUTO DIFF
HEMATOCRIT: 23.1 % — ABNORMAL LOW (ref 39.0–48.0)
HEMOGLOBIN: 8.4 g/dL — ABNORMAL LOW (ref 12.9–16.5)
MEAN CORPUSCULAR HEMOGLOBIN CONC: 36.2 g/dL — ABNORMAL HIGH (ref 32.0–36.0)
MEAN CORPUSCULAR HEMOGLOBIN: 31.4 pg (ref 25.9–32.4)
MEAN CORPUSCULAR VOLUME: 86.6 fL (ref 77.6–95.7)
MEAN PLATELET VOLUME: 7 fL (ref 6.8–10.7)
PLATELET COUNT: 33 10*9/L — ABNORMAL LOW (ref 150–450)
RED BLOOD CELL COUNT: 2.66 10*12/L — ABNORMAL LOW (ref 4.26–5.60)
RED CELL DISTRIBUTION WIDTH: 15.7 % — ABNORMAL HIGH (ref 12.2–15.2)
WBC ADJUSTED: 1.2 10*9/L — ABNORMAL LOW (ref 3.6–11.2)

## 2022-12-20 LAB — MANUAL DIFFERENTIAL
BASOPHILS - ABS (DIFF): 0 10*9/L (ref 0.0–0.1)
BASOPHILS - REL (DIFF): 0 %
EOSINOPHILS - ABS (DIFF): 0 10*9/L (ref 0.0–0.5)
EOSINOPHILS - REL (DIFF): 0 %
LYMPHOCYTES - ABS (DIFF): 1 10*9/L — ABNORMAL LOW (ref 1.1–3.6)
LYMPHOCYTES - REL (DIFF): 82 %
MONOCYTES - ABS (DIFF): 0 10*9/L — ABNORMAL LOW (ref 0.3–0.8)
MONOCYTES - REL (DIFF): 3 %
NEUTROPHILS - ABS (DIFF): 0.2 10*9/L — CL (ref 1.8–7.8)
NEUTROPHILS - REL (DIFF): 15 %

## 2022-12-20 LAB — LACTATE DEHYDROGENASE: LACTATE DEHYDROGENASE: 224 U/L (ref 120–246)

## 2022-12-20 LAB — URIC ACID: URIC ACID: 3.1 mg/dL — ABNORMAL LOW

## 2022-12-20 LAB — MAGNESIUM: MAGNESIUM: 1.7 mg/dL (ref 1.6–2.6)

## 2022-12-20 NOTE — Unmapped (Signed)
[facility] Leukemia Clinic Visit Note     Patient Name: Timothy Porter  Patient Age: 77 y.o.  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  He consented to [facility] S12 arm (ven x 28 days) and began treatment on 11/22/2022.  He presents to clinic today for follow up.     Post-cycle 1 bone marrow biopsy demonstrated no evidence of disease, though the specimen was poor.  MRD was also negative though enumeration also poor.  He is likely in remission if he recovers his counts.  Therefore we will wait for 1 week and recheck his blood counts. Anticipate C2 start in 1 week.     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 Likely treatment related with prior exposure to melphalan also lenalidomide. On BAML S12.   -Await count recovery  -1 week follow-up  - Hold ven   - Continue vori   - Anticipate stopping vori once counts recover     Free Kappa Chain MM: Treatment hx: RVD x4 (02/19/19-) - ASCT, mel 140 mg/m2 (05/29/19; VGPR) - Len maintenance, [id number] (10/22/19-08/2022; CR, MRD-neg).   - Continue to follow with myeloma team Melrose Nakayama).      GERD: Continue omeprazole      Alternating constipation and diarrhea: Continue home cholestyramine      Psychosocial support: He demonstrates good coping and has good support to proceed with the management above  - Counseling given   - Consider ref to comprehensive cancer support program if needed     Patient-centered care/Shared decision-making:   We discussed the plan above at length. The patient actively contributed to the conversation. Specifically, his most important outcomes are:   - Living longer   - Maintaining his overall functional activity      Supportive Care Needs: We recommend based on the patient???s underlying diagnosis and treatment history the following supportive care:     1. Antimicrobial prophylaxis: We will revisit during our next appointment. Tentatively for AML (not in remission): Bacterial: Levofloxacin 500mg  PO daily (absolute neutrophils >/= 0.5 until absolute neutrophils >/= 0.5);   Fungal: AML fungal: Posaconazole 300mg  PO daily (absolute neutrophils >/= 0.5 until absolute neutrophils >/= 0.5) ;   Viral: Valacyclovir 500mg  PO daily (continuous). If posaconazole is to be used, venetoclax should be reduced to 100 mg a day.      2. Blood product support:  Leukoreduced blood products are required.  Irradiated blood products are preferred, but in case of urgent transfusion needs non-irradiated blood products may be used. In the outpatient setting our recommendations are as below:  - 1 unit pRBC for Hb < 8 or Hb < 9 and symptomatic  - 1 unit of platelets for platelets < 20    Coordination:   - RTC in 1 week to eval for count recovery, C2 start     [name], MD  Leukemia Program  Division of Hematology  [facility]        Hematology/Oncology History Overview Note   Referring/Local Oncologist:  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      This electronic signature is attestation that the pathologist personally reviewed the submitted material(s) and the final diagnosis reflects that evaluation.          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,XY[7]         Myeloid Mutational Panel:        Variants of Known/Likely Clinical Significance:   Gene Coding Predicted Protein Variant allele fraction   SRSF2 c.284C>G p.(Pro95Arg) 21.6 %   Treatment Timeline:  11/22/2022 Cycle 1:  BAML S12 azacitidine 75mg /m2 x 7 days + venetoclax 400mg  x 28 days  12/13/22: Bmbx - Limited, though no increase in blasts, MRD-neg by flow, poor specimen but no blasts (at <0.2%)      Multiple myeloma (CMS-HCC)   02/12/2019 Initial Diagnosis    Multiple myeloma (CMS-HCC)     05/28/2019 - 06/03/2019 Chemotherapy    BMT IP AUTO MELPHALAN  Melphalan 140 mg/m2 or 200 mg/m2 IV Day -1     10/22/2019 - 06/27/2022 Chemotherapy    STUDY [id number] IRB# [id number] LENALIDOMIDE (v. 11/04/18)  A Randomized Study of Daratumumab Plus Lenalidomide Versus Lenalidomide Alone as Maintenance Treatment in Patients with Newly Diagnosed Multiple Myeloma Who Are Minimal Residual Disease Positive After Frontline Autologous Stem Cell Transplant.     History of auto stem cell transplant (CMS-HCC)   08/05/2019 Initial Diagnosis    History of auto stem cell transplant (CMS-HCC)     10/22/2019 - 06/27/2022 Chemotherapy    STUDY [id number] IRB# [id number] LENALIDOMIDE (v. 11/04/18)  A Randomized Study of Daratumumab Plus Lenalidomide Versus Lenalidomide Alone as Maintenance Treatment in Patients with Newly Diagnosed Multiple Myeloma Who Are Minimal Residual Disease Positive After Frontline Autologous Stem Cell Transplant.     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 Energy improving. Tolerated C1 well. No fevers, chills, or infectious symptoms.     ROS reviewed and negative except as noted in H and P     ECOG: 1    Allergies:  No Known Allergies    Medications:   Current Outpatient Medications:     allopurinol (ZYLOPRIM) 300 MG tablet, Take 1 tablet (300 mg total) by mouth daily., Disp: 30 tablet, Rfl: 2    calcium carbonate (TUMS) 200 mg calcium (500 mg) chewable tablet, Chew 2 tablets (400 mg of elem calcium total) Three (3) times a day., Disp: , Rfl:     cholecalciferol, vitamin D3 25 mcg, 1,000 units,, 1,000 unit (25 mcg) tablet, Take 1 tablet (25 mcg total) by mouth., Disp: ,  Rfl:     cyanocobalamin, vitamin B-12, 2,000 mcg Tab, Take 2,000 mcg by mouth in the morning., Disp: 90 tablet, Rfl: 3    docusate sodium (COLACE) 50 MG capsule, Take 1 capsule (50 mg total) by mouth two (2) times a day., Disp: , Rfl:     levoFLOXacin (LEVAQUIN) 500 MG tablet, Take 1 tablet (500 mg total) by mouth daily., Disp: 30 tablet, Rfl: 1    loratadine (CLARITIN) 10 mg tablet, Take 1 tablet (10 mg total) by mouth daily., Disp: , Rfl:     metoprolol succinate (TOPROL-XL) 50 MG 24 hr tablet, Take 1 tablet (50 mg total) by mouth., Disp: , Rfl:     multivitamin with minerals tablet, Take 1 tablet by mouth daily., Disp: , Rfl:     omeprazole (PRILOSEC) 40 MG capsule, Take 1 capsule (40 mg total) by mouth daily., Disp: 90 capsule, Rfl: 0    ondansetron (ZOFRAN) 8 MG tablet, Take 1 tablet (8 mg total) by mouth every eight (8) hours as needed for nausea., Disp: 30 tablet, Rfl: 2    sildenafiL, pulm.hypertension, (REVATIO) 20 mg tablet, Take 1-5 tablets (20-100 mg total) by mouth daily as needed (for erectile function)., Disp: 90 tablet, Rfl: 3    tamsulosin (FLOMAX) 0.4 mg capsule, Take 1 capsule (0.4 mg total) by mouth daily., Disp: 90 capsule, Rfl: 0    valACYclovir (VALTREX) 500 MG tablet, Take 1 tablet (500 mg total) by mouth daily., Disp: 90 tablet, Rfl: 3    venetoclax (VENCLEXTA) 100 mg tablet, Take 1 tablet (100 mg total) by mouth daily., Disp: 30 tablet, Rfl: 1    voriconazole (VFEND) 200 MG tablet, Take 1 tablet (200 mg total) by mouth two (2) times a day., Disp: 60 tablet, Rfl: 2  No current facility-administered medications for this visit.    Facility-Administered Medications Ordered in Other Visits:     DTaP-hepatitis B recombinant-IPV (PEDIARIX) 10 mcg-25Lf-25 mcg-10Lf/0.5 mL injection, , , ,     DTaP-hepatitis B recombinant-IPV (PEDIARIX) 10 mcg-25Lf-25 mcg-10Lf/0.5 mL injection, , , ,     haemophilus B polysac-tetanus toxoid (ActHIB) 10 mcg/0.5 mL injection, , , ,     influenza vaccine quad (FLUARIX, FLULAVAL, FLUZONE) (6 MOS & UP) 2020-21 ADS Med, , , ,     pneumococcal conjugate (13-valent) (PREVNAR-13) 0.5 mL vaccine, , , ,     pneumococcal conjugate (13-valent) (PREVNAR-13) 0.5 mL vaccine, , , ,     varicella-zoster gE-AS01B (PF) (SHINGRIX) 50 mcg/0.5 mL injection, , , ,     Medical History:  Past Medical History:   Diagnosis Date    Benign prostatic hyperplasia with lower urinary tract symptoms 11/27/2018    BPH (benign prostatic hyperplasia)     Diarrhea     Fatigue     Fractures     left arm    Glucose intolerance     May 2019: HbA1c 6.2.    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  Planning to build more apartments.       Family History:  Family History   Problem Relation Age of Onset  Dementia Father     No Known Problems Other        Objective:   Vitals:    12/20/22 1320   BP: 167/82   Pulse: 83   Resp: 18   Temp: 36.5 ??C (97.7 ??F)   SpO2: 100%         Physical Exam:  General: Resting, in no apparent distress  HEENT:  PERRL. No scleral icterus or conjunctival injection.   Heart:  RRR.  S1, S2.  No murmurs, gallops or rubs. Mild non-pitting edema noted BLE.   Lungs:  Breathing is unlabored, and patient is speaking full sentences with ease.  CTAB. No rales, ronchi or crackles.    Abdomen:  No distention or pain on palpation.    Skin:  No rashes, petechiae or purpura. Grossly intact.  Musculoskeletal:  strength is equal bilaterally   Psychiatric:  Range of affect is appropriate.    Neurologic:  Alert and oriented x 4.  Steady gait with assistance of cane.  ECOG: 0-1     Test Results:  Discussed bone marrow biopsy, CBC and chemistry with the patient

## 2022-12-22 DIAGNOSIS — C9200 Acute myeloblastic leukemia, not having achieved remission: Principal | ICD-10-CM

## 2022-12-22 NOTE — Unmapped (Cosign Needed)
Clinical Study: BAML-16-001-S12: A Randomized Phasae 2 Trial of 28 Day (Arm A) versus 14 Day (Arm B) Schedule of Venetoclax (Ven) + Azacitidine (Aza) in newly diagnosed acute myloid leukemia (AML) patients > 60 years       Cycle / Day: Cycle 2 Day 1- consideration  DOS: 12/20/22  Performance Status: 1  Subject #: [id number]  Cohort / Treatment: 28 days Venetoclax.    Narrative: Patient in clinic today for cycle 2 day 1 consideration of BAMl 16-001-S12 with Dr. [name]. C1 D21 BMBX report demonstrated no evidence of disease.,  Though the specimen was poor.  MRD was also negative though enumeration also poor.  He is likely in remission if he recovers his counts.  Therefore we will wait for 1 week and recheck his blood counts. Patient will rtc on 12/28/22 for clinic to reconsider beginning cycle 2 of BAML 16-001-S12.  At this time patient is feeling good and had no new issues or concerns to report.      Medical / Surgical History  Medical History Start Date End Date Grade Related to Prior Therapy? (Y / N)   Benign prostatic hyperplasia with lower urinary tract symptoms        BPH (benign prostatic hyperplasia)        Diarrhea       fatigue       Left arm fractures       Glucose intolerance 03/2018      H/O autologous stem cell transplant (CMS-HCC)        History of Atrial Fibrillation 03/15/2021      Hypertension       Multiple myeloma       prediabetes              BASELINE LABS       Anemia 11/15/22  3 No   White blood cell decrease 11/15/22  2 No   Platelet count decrease 11/15/22  3 No   Neutrophil count decrease 11/15/22  3 No   Lymphocyte cell decrease 11/15/22  1 No                           Oral Study Medication Compliance  Oral Medication # of Pills Dispensed # of Pills Taken Expected Returned Actual Returned % Compliance   Venetoclax 28               Date and reason for any missed dose(s):    Previous diary returned: No- patient given first cycle 1 diary on 11/22/22  Reason for discrepancies between diary and pill count:   New diary issued: No- Yes. Patient diary was given on 11/22/22  Educational and/or other handouts issued:  Patient education performed: Patient saw pharmacists team to go over oral medications.      Research Plan:RTC on 12/28/22. 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  (N / Y: Action Taken)   Fatigue 11/22/22  1 Not related N   Low appetite 11/22/22  1 Not related N   Toe infecton 11/22/22 12/20/22 1 Not related N   Vertigo 12/02/22   Not related N           White blood cell count decrease 11/22/22  3 Not related N   Anemia 11/22/22 11/25/22 2 Not related N  Anemia 11/25/22 11/29/22 3 Not related N   Anemia 11/29/22 12/03/22 2 Not related N   Anemia 12/03/22 12/06/22 3 Not related N   Anemia 12/06/22 12/10/22 2 Not related N   Anemia 12/10/22 12/13/22 3 Not related N   Anemia 12/13/22  2 Not related  N   Lymphocyte decrease 11/25/22 12/20/22 2 Not related N   Lymphocyte decrease 12/20/22  1 Not related N   Platelet count decrease 11/22/22  4 Not related N   Neutrophil count decrease 11/22/22 12/03/22 3 Not related N   Neutrophil count decrease 12/03/22  4 Not related N                           All lab values and assessments were reviewed by the investigator and are considered not clinically significant unless otherwise noted.    Concomitant Medications   Medication Dose Start Date End Date Indication Related AE, if any   Calcium Carbonate 400mg  7/3/202  2 tablets for calcium    Cholecalciferol 25 mcg 09/20/20  1 tablet for vitamin D    Cholestyramine 4 grams 07/21/20  4grams in water for diarrhea    B12 2,000 mcg 04/04/22  2,000 mcg by mouth everyday    Diltiazem (Cardizem) 120 Mg   1 tablet by mouth    Tiazec 120 mg   1 tablet by mouth    Ferrous sulfate 325 mg  11/22/22 1 tablet by mouth    Flonase 50 mcg   Intranasal 1x a day    Claritin 10 mg   1 tablet by mouth    Toprol 50 mg   1 tablet by mouth    omeprazole 40 mg   1 tablet by mouth    Revatio 20 mg   1-5 tablets as needed    Tamsulosin .4mg    1 tablet by mouth as needed    Trazodone 50 mg  11/22/22 1 tablet by mouth Not been using   Valtrex 500 mg   1 tablet by mouth    Study Medication        Azacitidine        Venetoclax

## 2022-12-24 ENCOUNTER — Encounter
Admit: 2022-12-24 | Discharge: 2022-12-25 | Payer: MEDICARE | Attending: Nurse Practitioner | Primary: Nurse Practitioner

## 2022-12-24 ENCOUNTER — Ambulatory Visit: Admit: 2022-12-24 | Discharge: 2022-12-25 | Payer: MEDICARE

## 2022-12-24 LAB — CBC W/ AUTO DIFF
BASOPHILS ABSOLUTE COUNT: 0 10*9/L (ref 0.0–0.1)
BASOPHILS RELATIVE PERCENT: 0.5 %
EOSINOPHILS ABSOLUTE COUNT: 0.1 10*9/L (ref 0.0–0.5)
EOSINOPHILS RELATIVE PERCENT: 4.2 %
HEMATOCRIT: 22 % — ABNORMAL LOW (ref 39.0–48.0)
HEMOGLOBIN: 7.9 g/dL — ABNORMAL LOW (ref 12.9–16.5)
LYMPHOCYTES ABSOLUTE COUNT: 1.1 10*9/L (ref 1.1–3.6)
LYMPHOCYTES RELATIVE PERCENT: 63.2 %
MEAN CORPUSCULAR HEMOGLOBIN CONC: 35.8 g/dL (ref 32.0–36.0)
MEAN CORPUSCULAR HEMOGLOBIN: 31.2 pg (ref 25.9–32.4)
MEAN CORPUSCULAR VOLUME: 87.2 fL (ref 77.6–95.7)
MEAN PLATELET VOLUME: 6.8 fL (ref 6.8–10.7)
MONOCYTES ABSOLUTE COUNT: 0.2 10*9/L — ABNORMAL LOW (ref 0.3–0.8)
MONOCYTES RELATIVE PERCENT: 14 %
NEUTROPHILS ABSOLUTE COUNT: 0.3 10*9/L — CL (ref 1.8–7.8)
NEUTROPHILS RELATIVE PERCENT: 18.1 %
PLATELET COUNT: 38 10*9/L — ABNORMAL LOW (ref 150–450)
RED BLOOD CELL COUNT: 2.53 10*12/L — ABNORMAL LOW (ref 4.26–5.60)
RED CELL DISTRIBUTION WIDTH: 15.9 % — ABNORMAL HIGH (ref 12.2–15.2)
WBC ADJUSTED: 1.8 10*9/L — ABNORMAL LOW (ref 3.6–11.2)

## 2022-12-24 MED ADMIN — acetaminophen (TYLENOL) tablet 1,000 mg: 1000 mg | ORAL | @ 15:00:00 | Stop: 2022-12-24

## 2022-12-24 NOTE — Unmapped (Signed)
Hospital Outpatient Visit on 12/24/2022   Component Date Value Ref Range Status    Antibody Screen 12/24/2022 NEG   Final    Blood Type 12/24/2022 A POS   Final    WBC 12/24/2022 1.8 (L)  3.6 - 11.2 10*9/L Final    RBC 12/24/2022 2.53 (L)  4.26 - 5.60 10*12/L Final    HGB 12/24/2022 7.9 (L)  12.9 - 16.5 g/dL Final    HCT 16/08/9603 22.0 (L)  39.0 - 48.0 % Final    MCV 12/24/2022 87.2  77.6 - 95.7 fL Final    MCH 12/24/2022 31.2  25.9 - 32.4 pg Final    MCHC 12/24/2022 35.8  32.0 - 36.0 g/dL Final    RDW 54/07/8118 15.9 (H)  12.2 - 15.2 % Final    MPV 12/24/2022 6.8  6.8 - 10.7 fL Final    Platelet 12/24/2022 38 (L)  150 - 450 10*9/L Final    Neutrophils % 12/24/2022 18.1  % Final    Lymphocytes % 12/24/2022 63.2  % Final    Monocytes % 12/24/2022 14.0  % Final    Eosinophils % 12/24/2022 4.2  % Final    Basophils % 12/24/2022 0.5  % Final    Absolute Neutrophils 12/24/2022 0.3 (LL)  1.8 - 7.8 10*9/L Final    Absolute Lymphocytes 12/24/2022 1.1  1.1 - 3.6 10*9/L Final    Absolute Monocytes 12/24/2022 0.2 (L)  0.3 - 0.8 10*9/L Final    Absolute Eosinophils 12/24/2022 0.1  0.0 - 0.5 10*9/L Final    Absolute Basophils 12/24/2022 0.0  0.0 - 0.1 10*9/L Final    Crossmatch 12/24/2022 Compatible   Final    Unit Blood Type 12/24/2022 A Pos   Final    ISBT Number 12/24/2022 [id number]   Final    Unit # 12/24/2022 [id number]   Final    Status 12/24/2022 Issued   Final    Spec Expiration 12/24/2022 [id number]   Final    Product ID 12/24/2022 Red Blood Cells   Final    PRODUCT CODE 12/24/2022 [id number]   Final    Crossmatch 12/24/2022 Compatible   Final    Unit Blood Type 12/24/2022 A Pos   Final    ISBT Number 12/24/2022 [id number]   Final    Unit # 12/24/2022 [id number]   Final    Status 12/24/2022 Issued   Final    Spec Expiration 12/24/2022 [id number]   Final    Product ID 12/24/2022 Red Blood Cells   Final    PRODUCT CODE 12/24/2022 [id number]   Final          RED ZONE Means: RED ZONE: Take action now!     You need to be seen right away  Symptoms are at a severe level of discomfort    Call 911 or go to your nearest  Hospital for help     - Bleeding that will not stop    - Hard to breathe    - New seizure - Chest pain  - Fall or passing out  -Thoughts of hurting    yourself or others      Call 911 if you are going into the RED ZONE                  YELLOW ZONE Means:     Please call with any new or worsening symptom(s), even if not on this list.  Call [phone]  After hours, weekends, and holidays - you will reach  a long recording with specific instructions, If not in an emergency such as above, please listen closely all the way to the end and choose the option that relates to your need.   You can be seen by a provider the same day through our Same Day Acute Care for Patients with Cancer program.      YELLOW ZONE: Take action today     Symptoms are new or worsening  You are not within your goal range for:    - Pain    - Shortness of breath    - Bleeding (nose, urine, stool, wound)    - Feeling sick to your stomach and throwing up    - Mouth sores/pain in your mouth or throat    - Hard stool or very loose stools (increase in       ostomy output)    - No urine for 12 hours    - Feeding tube or other catheter/tube issue    - Redness or pain at previous IV or port/catheter site    - Depressed or anxiety   - Swelling (leg, arm, abdomen,     face, neck)  - Skin rash or skin changes  - Wound issues (redness, drainage,    re-opened)  - Confusion  - Vision changes  - Fever >100.4 F or chills  - Worsening cough with mucus that is    green, yellow, or bloody  - Pain or burning when going to the    bathroom  - Home Infusion Pump Issue- call    [phone]         Call your healthcare provider if you are going into the YELLOW ZONE     GREEN ZONE Means:  Your symptoms are under controls  Continue to take your medicine as ordered  Keep all visits to the provider GREEN ZONE: You are in control  No increase or worsening symptoms  Able to take your medicine  Able to drink and eat    - DO NOT use MyChart messages to report red or yellow symptoms. Allow up to 3    business days for a reply.  -MyChart is for non-urgent medication refills, scheduling requests, or other general questions.         [id number] Rev. 05/26/2022  Approved by Oncology Patient Education Committee

## 2022-12-24 NOTE — Unmapped (Signed)
Patient came in for lab check and transfusion. Hemoglobin was 7.9, per therapy plan given 2 units of pRBCs. Patient didn't complain of shortness of breath, fever, and itching; no s/s of reactions observed. Patient tolerated treatment. Platelet count was 38, per therapy plan no interventions needed at this time. Patient was stable at discharge. Patient self-ambulated to lobby.

## 2022-12-24 NOTE — Unmapped (Signed)
Encounter addended by: Thurmond Butts, LPN on: 7/82/9562 1:38 PM   Actions taken: Flowsheet accepted, LDA properties accepted

## 2022-12-24 NOTE — Unmapped (Signed)
Brief Oncology Note    Patient complains of HA and requesting tylenol 1,000mg  PO.     Vitals:    12/24/22 0800   BP: 170/80   Pulse: 89   Resp: 18   Temp: 36.4 ??C (97.5 ??F)   TempSrc: Oral      Lab Results   Component Value Date    WBC 1.8 (L) 12/24/2022    HGB 7.9 (L) 12/24/2022    HCT 22.0 (L) 12/24/2022    PLT 38 (L) 12/24/2022       Lab Results   Component Value Date    NA 134 (L) 12/20/2022    K 3.5 12/20/2022    CL 103 12/20/2022    CO2 26.0 12/20/2022    BUN 27 (H) 12/20/2022    CREATININE 0.99 12/20/2022    GLU 103 12/20/2022    CALCIUM 8.6 (L) 12/20/2022    MG 1.7 12/20/2022    PHOS 3.2 12/20/2022       Lab Results   Component Value Date    BILITOT 0.8 12/20/2022    BILIDIR <0.10 07/03/2019    PROT 6.6 12/20/2022    ALBUMIN 3.6 12/20/2022    ALT 23 12/20/2022    AST 27 12/20/2022    ALKPHOS 126 (H) 12/20/2022    GGT 64 06/28/2019       Lab Results   Component Value Date    PT 11.0 11/17/2022    INR 0.99 11/17/2022    APTT 26.2 11/17/2022              Erlinda Hong, PA   Hematology [facility] Provider

## 2022-12-28 ENCOUNTER — Encounter
Admit: 2022-12-28 | Discharge: 2022-12-28 | Payer: MEDICARE | Attending: Nurse Practitioner | Primary: Nurse Practitioner

## 2022-12-28 ENCOUNTER — Other Ambulatory Visit: Admit: 2022-12-28 | Discharge: 2022-12-28 | Payer: MEDICARE

## 2022-12-28 ENCOUNTER — Ambulatory Visit: Admit: 2022-12-28 | Discharge: 2022-12-28 | Payer: MEDICARE

## 2022-12-28 LAB — CBC W/ AUTO DIFF
HEMATOCRIT: 26.7 % — ABNORMAL LOW (ref 39.0–48.0)
HEMOGLOBIN: 9.5 g/dL — ABNORMAL LOW (ref 12.9–16.5)
MEAN CORPUSCULAR HEMOGLOBIN CONC: 35.6 g/dL (ref 32.0–36.0)
MEAN CORPUSCULAR HEMOGLOBIN: 31.3 pg (ref 25.9–32.4)
MEAN CORPUSCULAR VOLUME: 87.9 fL (ref 77.6–95.7)
MEAN PLATELET VOLUME: 7.2 fL (ref 6.8–10.7)
PLATELET COUNT: 47 10*9/L — ABNORMAL LOW (ref 150–450)
RED BLOOD CELL COUNT: 3.04 10*12/L — ABNORMAL LOW (ref 4.26–5.60)
RED CELL DISTRIBUTION WIDTH: 15.9 % — ABNORMAL HIGH (ref 12.2–15.2)
WBC ADJUSTED: 2 10*9/L — ABNORMAL LOW (ref 3.6–11.2)

## 2022-12-28 LAB — COMPREHENSIVE METABOLIC PANEL
ALBUMIN: 3.6 g/dL (ref 3.4–5.0)
ALKALINE PHOSPHATASE: 129 U/L — ABNORMAL HIGH (ref 46–116)
ALT (SGPT): 29 U/L (ref 10–49)
ANION GAP: 3 mmol/L — ABNORMAL LOW (ref 5–14)
AST (SGOT): 33 U/L (ref <=34)
BILIRUBIN TOTAL: 0.9 mg/dL (ref 0.3–1.2)
BLOOD UREA NITROGEN: 21 mg/dL (ref 9–23)
BUN / CREAT RATIO: 25
CALCIUM: 8.5 mg/dL — ABNORMAL LOW (ref 8.7–10.4)
CHLORIDE: 110 mmol/L — ABNORMAL HIGH (ref 98–107)
CO2: 24 mmol/L (ref 20.0–31.0)
CREATININE: 0.84 mg/dL
EGFR CKD-EPI (2021) MALE: >90 mL/min/{1.73_m2} (ref >=60)
GLUCOSE RANDOM: 131 mg/dL (ref 70–179)
POTASSIUM: 3.2 mmol/L — ABNORMAL LOW (ref 3.4–4.8)
PROTEIN TOTAL: 6.5 g/dL (ref 5.7–8.2)
SODIUM: 137 mmol/L (ref 135–145)

## 2022-12-28 LAB — MANUAL DIFFERENTIAL
BASOPHILS - ABS (DIFF): 0 10*9/L (ref 0.0–0.1)
BASOPHILS - REL (DIFF): 0 %
EOSINOPHILS - ABS (DIFF): 0 10*9/L (ref 0.0–0.5)
EOSINOPHILS - REL (DIFF): 0 %
LYMPHOCYTES - ABS (DIFF): 0.9 10*9/L — ABNORMAL LOW (ref 1.1–3.6)
LYMPHOCYTES - REL (DIFF): 43 %
MONOCYTES - ABS (DIFF): 0.1 10*9/L — ABNORMAL LOW (ref 0.3–0.8)
MONOCYTES - REL (DIFF): 7 %
NEUTROPHILS - ABS (DIFF): 1 10*9/L — ABNORMAL LOW (ref 1.8–7.8)
NEUTROPHILS - REL (DIFF): 50 %

## 2022-12-28 LAB — MAGNESIUM: MAGNESIUM: 1.6 mg/dL (ref 1.6–2.6)

## 2022-12-28 LAB — PHOSPHORUS: PHOSPHORUS: 2.5 mg/dL (ref 2.4–5.1)

## 2022-12-28 LAB — LACTATE DEHYDROGENASE: LACTATE DEHYDROGENASE: 253 U/L — ABNORMAL HIGH (ref 120–246)

## 2022-12-28 LAB — URIC ACID: URIC ACID: 3.5 mg/dL — ABNORMAL LOW

## 2022-12-28 NOTE — Unmapped (Unsigned)
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    Narrative: Patient in clinic today for cycle 2 day 1 consideration of BAMl 16-001-S12 with Oliver Hum. C1 D21 BMBX report demonstrated no evidence of disease,  Though the specimen was poor.  MRD was also negative though enumeration also poor.  He is likely in remission if he recovers his counts.  Therefore we will wait for 1 week and recheck his blood counts. Patient will be rescheduled to start cycle 2 on 01/04/23.  At this time patient is feeling good and takes 20 minute walks regularly. He reports intermittent dizziness once in awhile when moving from sitting to standing and was educated by provider to transition positions more slowly to allow blood flow to regulate. If dizzy spells occur on weekends patient instructed to contact triage nurse or report to ED if anything adverse or serious occurs. Patient instructed to stop allopurinol, stop voriconozole. Continue valacyclovir and levofloxacin.      Medical / Surgical History  Medical History Start Date End Date Grade Related to Prior Therapy? (Y / N)   Benign prostatic hyperplasia with lower urinary tract symptoms  hernia      BPH (benign prostatic hyperplasia)  hernia      Diarrhea       fatigue       Glucose intolerance 03/2018   No   H/O autologous stem cell transplant (CMS-HCC)  04/2019 05/2019     History of Atrial Fibrillation 03/15/2021      Hypertension       Multiple myeloma 2020      prediabetes 03/28/2017             BASELINE LABS       Anemia 11/15/22  3 No   White blood cell decrease 11/15/22  2 No   Platelet count decrease 11/15/22  3 No   Neutrophil count decrease 11/15/22  3 No   Lymphocyte cell decrease 11/15/22  1 No                           Oral Study Medication Compliance  Oral Medication # of Pills Dispensed # of Pills Taken Expected Returned Actual Returned % Compliance   Venetoclax 28               Date and reason for any missed dose(s):    Previous diary returned: No- patient given first cycle 1 diary on 11/22/22. Will give cycle 2 diary on 01/03/23  Reason for discrepancies between diary and pill count:   New diary issued: No- Will give cycle 2 diary on 01/03/23  Educational and/or other handouts issued:  Patient education performed: Patient saw pharmacists team to go over oral medications.      Research Plan:RTC on 01/04/23. 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  (N / Y: Action Taken)   Fatigue 11/22/22  1 Not related N   Low appetite 11/22/22  1 Not  related N   Toe infecton 11/22/22 12/20/22 1 Not related N   Vertigo 12/02/22   Not related N           White blood cell count decrease 11/22/22  3 Not related N   Anemia 11/22/22 11/25/22 2 Not related N   Anemia 11/25/22 11/29/22 3 Not related N   Anemia 11/29/22 12/03/22 2 Not related N   Anemia 12/03/22 12/06/22 3 Not related N   Anemia 12/06/22 12/10/22 2 Not related N   Anemia 12/10/22 12/13/22 3 Not related N   Anemia 12/13/22  2 Not related  N   Lymphocyte decrease 11/25/22 12/20/22 2 Not related N   Lymphocyte decrease 12/20/22  1 Not related N   Platelet count decrease 11/22/22  4 Not related N   Neutrophil count decrease 11/22/22 12/03/22 3 Not related N   Neutrophil count decrease 12/03/22  4 Not related N                           All lab values and assessments were reviewed by the investigator and are considered not clinically significant unless otherwise noted.    Concomitant Medications   Medication Dose Start Date End Date Indication Related AE, if any   Calcium Carbonate 400mg  7/3/202  2 tablets for calcium    Cholecalciferol 25 mcg 09/20/20  1 tablet for vitamin D    Cholestyramine 4 grams 07/21/20  4grams in water for diarrhea    B12 2,000 mcg 04/04/22  2,000 mcg by mouth everyday    Diltiazem (Cardizem) 120 Mg   1 tablet by mouth    Tiazec 120 mg   1 tablet by mouth    Ferrous sulfate 325 mg  11/22/22 1 tablet by mouth    Flonase 50 mcg   Intranasal 1x a day    Claritin 10 mg   1 tablet by mouth    Toprol 50 mg   1 tablet by mouth    omeprazole 40 mg   1 tablet by mouth    Revatio 20 mg   1-5 tablets as needed    Tamsulosin .4mg    1 tablet by mouth as needed    Trazodone 50 mg  11/22/22 1 tablet by mouth Not been using   Valtrex 500 mg   1 tablet by mouth    Study Medication        Azacitidine        Venetoclax

## 2022-12-28 NOTE — Unmapped (Addendum)
We're still going to have to delay your treatment for another week because your platelets are not up to 50 yet.     We need your platelets to be 50 and your ANC to be 0.5 before we restart.     I don't think you will need any lab monitoring for the next week.     I want you to stop the   - voriconazole  - allopurinol    I want you to continue to take the   - levofloxacin  - valacyclovir    You should continue to hold the venetoclax.   When we restart this it will go up to 4 tablets a day.     I will see you in a week.     Lab on 12/28/2022   Component Date Value Ref Range Status    Sodium 12/28/2022 137  135 - 145 mmol/L Final    Potassium 12/28/2022 3.2 (L)  3.4 - 4.8 mmol/L Final    Chloride 12/28/2022 110 (H)  98 - 107 mmol/L Final    CO2 12/28/2022 24.0  20.0 - 31.0 mmol/L Final    Anion Gap 12/28/2022 3 (L)  5 - 14 mmol/L Final    BUN 12/28/2022 21  9 - 23 mg/dL Final    Creatinine 91/47/8295 0.84  0.73 - 1.18 mg/dL Final    BUN/Creatinine Ratio 12/28/2022 25   Final    eGFR CKD-EPI (2021) Male 12/28/2022 >90  >=60 mL/min/1.13m2 Final    eGFR calculated with CKD-EPI 2021 equation in accordance with [employer] and [employer] of Nephrology Task Force recommendations.    Glucose 12/28/2022 131  70 - 179 mg/dL Final    Calcium 62/13/0865 8.5 (L)  8.7 - 10.4 mg/dL Final    Albumin 78/46/9629 3.6  3.4 - 5.0 g/dL Final    Total Protein 12/28/2022 6.5  5.7 - 8.2 g/dL Final    Total Bilirubin 12/28/2022 0.9  0.3 - 1.2 mg/dL Final    AST 52/84/1324 33  <=34 U/L Final    ALT 12/28/2022 29  10 - 49 U/L Final    Alkaline Phosphatase 12/28/2022 129 (H)  46 - 116 U/L Final    Magnesium 12/28/2022 1.6  1.6 - 2.6 mg/dL Final    Phosphorus 40/08/2724 2.5  2.4 - 5.1 mg/dL Final    LDH 36/64/4034 253 (H)  120 - 246 U/L Final    Uric Acid 12/28/2022 3.5 (L)  3.7 - 9.2 mg/dL Final    WBC 74/25/9563 2.0 (L)  3.6 - 11.2 10*9/L Preliminary    RBC 12/28/2022 3.04 (L)  4.26 - 5.60 10*12/L Preliminary    HGB 12/28/2022 9.5 (L)  12.9 - 16.5 g/dL Preliminary    HCT 87/56/4332 26.7 (L)  39.0 - 48.0 % Preliminary    MCV 12/28/2022 87.9  77.6 - 95.7 fL Preliminary    MCH 12/28/2022 31.3  25.9 - 32.4 pg Preliminary    MCHC 12/28/2022 35.6  32.0 - 36.0 g/dL Preliminary    RDW 95/18/8416 15.9 (H)  12.2 - 15.2 % Preliminary    MPV 12/28/2022 7.2  6.8 - 10.7 fL Preliminary    Platelet 12/28/2022 47 (L)  150 - 450 10*9/L Preliminary    Questionable result confirmed. Specimen integrity/identity confirmed.

## 2022-12-29 DIAGNOSIS — C9200 Acute myeloblastic leukemia, not having achieved remission: Principal | ICD-10-CM

## 2022-12-29 NOTE — Unmapped (Signed)
Richardson Dopp Claud contacted the [facility] requesting to speak with the care team of Tarrin Weidel Cartmell to discuss:    -his scheduled 12/31/22 transfusion. Patient expressed that this appointment is ok with him but he wanted to confirm that he is to come in because he last discussed with Marylene Land that he'd wait a week have no appointments until next Wednesday.     Please contact [place]  at [phone].    Thank you,   Durward Fortes  [facility]   [phone]

## 2023-01-01 NOTE — Unmapped (Signed)
[facility] Leukemia Clinic Visit Note     Patient Name: Timothy Porter  Patient Age: 77 y.o.  Encounter Date: 12/28/2022    Primary Care Provider:  Pcp, None Per Patient    Referring Physician:  Pcp, None Per Patient  [address] [place],  [place] [address]    Reason for visit:  AML     Assessment:  Timothy Porter is a 77 y.o. 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  He enrolled on BAML S12 arm (ven x 28 days) and began treatment on 11/22/2022. After 1 cycle of treatment he was found to be in CR without MRD.  He presents to clinic today for follow up.     Timothy Porter tolerated treatment on aza/ven well without toxicities or complications.  He had a BMbx done after completing cycle 1 and was found to be in CR without MRD, however limited sampling.  Cycle 2 was delayed last week for delayed count recovery.  On lab review today his plt are 47 and ANC 1.0.  Per protocol he must have ANC >/= 0.5 and plt >/= 50 to proceed with cycle 2, therefore we will plan to delay treatment for additional week.  Anticipate that he will have full count recovery next week and we will plan to proceed with cycle 2 at same dosing of azacitidine 75mg /m2 x 7 days + venetoclax 400mg  x 28 days (table 5).  Will have him stop voriconazole today.  Will also stop allopurinol.  Continue levaquin and valtrex. Will plan for him to RTC in 1 week.    Free Kappa Chain MM:  He will continue follow up with MM team and has upcoming appt with [name] on 02/13/2023 Plan:   - continue to hold venetoclax  - STOP voriconazole and allopurinol  - continue levofloxacin and valtrex  - RTC in 1 week for consideration of cycle 2 S12 protocol  - once ANC >/= 0.5 and plt >/= 50 proceed with cycle 2 of azacitidine 75mg /m2 x 7 days + venetoclax 400mg  x 28 days.       [name] was available    Timothy Porter, AGNP-BC  Leukemia Research Nurse Practitioner  Hematology/Oncology Division  [facility]  12/28/2022    I personally spent 50 minutes face-to-face and non-face-to-face in the care of this patient, which includes all pre, intra, and post visit time on the date of service.  All documented time was specific to the E/M visit and does not include any procedures that may have been performed.    Hematology/Oncology History Overview Note   Referring/Local Oncologist:  [name]     Diagnosis:   Diagnosis   Date Value Ref Range Status   10/26/2022   Final    Bone marrow, left iliac, aspiration and biopsy  -  Hypercellular bone marrow (50%) involved by acute myeloid leukemia with MECOM rearrangement (23% blasts by manual aspirate differential and 10-20% CD34-positive blasts by immunohistochemistry)   -  Mild to focally moderate marrow fibrosis (MF-1  to 2)   -  Less than 5% plasma cells by manual aspirate differential count (see Comment)  -  See linked reports for associated Ancillary Studies.      This electronic signature is attestation that the pathologist personally reviewed the submitted material(s) and the final diagnosis reflects that evaluation.          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,XY[7]         Myeloid Mutational Panel:        Variants of Known/Likely Clinical Significance:   Gene Coding Predicted Protein Variant allele fraction   Timothy Porter c.284C>G p.(Pro95Arg) 21.6 %   Treatment Timeline:  11/22/2022 Cycle 1:  BAML S12 azacitidine 75mg /m2 x 7 days + venetoclax 400mg  x 28 days  12/13/22: Bmbx - Limited, though no increase in blasts, MRD-neg by flow, poor specimen but no blasts (at <0.2%)      Multiple myeloma (CMS-HCC)   02/12/2019 Initial Diagnosis    Multiple myeloma (CMS-HCC)     05/28/2019 - 06/03/2019 Chemotherapy    BMT IP AUTO MELPHALAN  Melphalan 140 mg/m2 or 200 mg/m2 IV Day -1     10/22/2019 - 06/27/2022 Chemotherapy    STUDY [id number] IRB# [id number] LENALIDOMIDE (v. 11/04/18)  A Randomized Study of Daratumumab Plus Lenalidomide Versus Lenalidomide Alone as Maintenance Treatment in Patients with Newly Diagnosed Multiple Myeloma Who Are Minimal Residual Disease Positive After Frontline Autologous Stem Cell Transplant.     History of auto stem cell transplant (CMS-HCC)   08/05/2019 Initial Diagnosis    History of auto stem cell transplant (CMS-HCC)     10/22/2019 - 06/27/2022 Chemotherapy    STUDY [id number] IRB# [id number] LENALIDOMIDE (v. 11/04/18)  A Randomized Study of Daratumumab Plus Lenalidomide Versus Lenalidomide Alone as Maintenance Treatment in Patients with Newly Diagnosed Multiple Myeloma Who Are Minimal Residual Disease Positive After Frontline Autologous Stem Cell Transplant.     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 Gradney states he has been feeling pretty good. He continues to have some occasional dizzy spells, but changes his position slowly to mitigate this.  Denies any n/v/d.  Appetite is good.  Denies any new sob/cough and no few fevers/chills.  No new complaints today.     Otherwise, he denies new constitutional symptoms such as anorexia, weight loss, fatigue, night sweats or unexplained fevers.  Furthermore, he denies unexplained bleeding or bruising, recurrent or unexplained intercurrent infections, dyspnea on exertion, lightheadedness, palpitations or chest pain.  There have been no new or unexplained pains or self-identified masses, swelling or enlarged lymph nodes.    ROS reviewed and negative except as noted in H and P     ECOG: 1    Allergies:  No Known Allergies    Medications:   Current Outpatient Medications:     calcium carbonate (TUMS) 200 mg calcium (500 mg) chewable tablet, Chew 2 tablets (400 mg of elem calcium total) Three (3) times a day., Disp: , Rfl:     cholecalciferol, vitamin D3 25 mcg, 1,000 units,, 1,000 unit (25 mcg) tablet, Take 1 tablet (25 mcg total) by mouth., Disp: , Rfl:     cyanocobalamin, vitamin B-12, 2,000 mcg Tab, Take 2,000 mcg by mouth in  the morning., Disp: 90 tablet, Rfl: 3    docusate sodium (COLACE) 50 MG capsule, Take 1 capsule (50 mg total) by mouth two (2) times a day., Disp: , Rfl:     levoFLOXacin (LEVAQUIN) 500 MG tablet, Take 1 tablet (500 mg total) by mouth daily., Disp: 30 tablet, Rfl: 1    loratadine (CLARITIN) 10 mg tablet, Take 1 tablet (10 mg total) by mouth daily., Disp: , Rfl:     metoprolol succinate (TOPROL-XL) 50 MG 24 hr tablet, Take 1 tablet (50 mg total) by mouth., Disp: , Rfl:     multivitamin with minerals tablet, Take 1 tablet by mouth daily., Disp: , Rfl:     omeprazole (PRILOSEC) 40 MG capsule, Take 1 capsule (40 mg total) by mouth daily., Disp: 90 capsule, Rfl: 0    ondansetron (ZOFRAN) 8 MG tablet, Take 1 tablet (8 mg total) by mouth every eight (8) hours as needed for nausea., Disp: 30 tablet, Rfl: 2    sildenafiL, pulm.hypertension, (REVATIO) 20 mg tablet, Take 1-5 tablets (20-100 mg total) by mouth daily as needed (for erectile function)., Disp: 90 tablet, Rfl: 3    tamsulosin (FLOMAX) 0.4 mg capsule, Take 1 capsule (0.4 mg total) by mouth daily., Disp: 90 capsule, Rfl: 0    valACYclovir (VALTREX) 500 MG tablet, Take 1 tablet (500 mg total) by mouth daily., Disp: 90 tablet, Rfl: 3    [START ON 01/04/2023] venetoclax (VENCLEXTA) 100 mg tablet, Take 4 tablets (400 mg total) by mouth daily., Disp: 120 tablet, Rfl: 0  No current facility-administered medications for this visit.    Facility-Administered Medications Ordered in Other Visits:     DTaP-hepatitis B recombinant-IPV (PEDIARIX) 10 mcg-25Lf-25 mcg-10Lf/0.5 mL injection, , , ,     DTaP-hepatitis B recombinant-IPV (PEDIARIX) 10 mcg-25Lf-25 mcg-10Lf/0.5 mL injection, , , ,     haemophilus B polysac-tetanus toxoid (ActHIB) 10 mcg/0.5 mL injection, , , ,     influenza vaccine quad (FLUARIX, FLULAVAL, FLUZONE) (6 MOS & UP) 2020-21 ADS Med, , , ,     pneumococcal conjugate (13-valent) (PREVNAR-13) 0.5 mL vaccine, , , ,     pneumococcal conjugate (13-valent) (PREVNAR-13) 0.5 mL vaccine, , , ,     varicella-zoster gE-AS01B (PF) (SHINGRIX) 50 mcg/0.5 mL injection, , , ,     Medical History:  Past Medical History:   Diagnosis Date    Benign prostatic hyperplasia with lower urinary tract symptoms 11/27/2018    BPH (benign prostatic hyperplasia)     Diarrhea     Fatigue     Fractures     left arm    Glucose intolerance     May 2019: HbA1c 6.2.    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  Planning to build more apartments.       Family History:  Family History   Problem Relation Age of Onset    Dementia Father     No Known Problems Other        Objective:   Vitals:    12/28/22 1056   BP: 157/86   Pulse: 92   Resp: 18  Temp: 36.3 ??C (97.4 ??F)   SpO2: 99%       Physical Exam:  General: Resting, in no apparent distress  HEENT:  PERRL. No scleral icterus or conjunctival injection.   Heart:  RRR.  S1, S2.  No murmurs, gallops or rubs. Mild non-pitting edema noted BLE.   Lungs:  Breathing is unlabored, and patient is speaking full sentences with ease.  CTAB. No rales, ronchi or crackles.    Abdomen:  No distention or pain on palpation.  Bowel sounds are present.  No palpable masses.  Skin:  No rashes, petechiae or purpura. Grossly intact.  Musculoskeletal:  strength is equal bilaterally   Psychiatric:  Range of affect is appropriate.    Neurologic:  Alert and oriented x 4.  Steady gait with assistance of cane.    Test Results:  Lab on 12/28/2022   Component Date Value Ref Range Status    ABO Grouping 12/28/2022 A POS   Final    Antibody Screen 12/28/2022 NEG   Final    Sodium 12/28/2022 137  135 - 145 mmol/L Final    Potassium 12/28/2022 3.2 (L)  3.4 - 4.8 mmol/L Final    Chloride 12/28/2022 110 (H)  98 - 107 mmol/L Final    CO2 12/28/2022 24.0  20.0 - 31.0 mmol/L Final    Anion Gap 12/28/2022 3 (L)  5 - 14 mmol/L Final    BUN 12/28/2022 21  9 - 23 mg/dL Final    Creatinine 36/64/4034 0.84  0.73 - 1.18 mg/dL Final    BUN/Creatinine Ratio 12/28/2022 25   Final    eGFR CKD-EPI (2021) Male 12/28/2022 >90  >=60 mL/min/1.1m2 Final    eGFR calculated with CKD-EPI 2021 equation in accordance with [employer] and [employer] of Nephrology Task Force recommendations.    Glucose 12/28/2022 131  70 - 179 mg/dL Final    Calcium 74/25/9563 8.5 (L)  8.7 - 10.4 mg/dL Final    Albumin 87/56/4332 3.6  3.4 - 5.0 g/dL Final    Total Protein 12/28/2022 6.5  5.7 - 8.2 g/dL Final    Total Bilirubin 12/28/2022 0.9  0.3 - 1.2 mg/dL Final    AST 95/18/8416 33  <=34 U/L Final    ALT 12/28/2022 29  10 - 49 U/L Final    Alkaline Phosphatase 12/28/2022 129 (H)  46 - 116 U/L Final    Magnesium 12/28/2022 1.6  1.6 - 2.6 mg/dL Final    Phosphorus 60/63/0160 2.5  2.4 - 5.1 mg/dL Final    LDH 10/93/2355 253 (H)  120 - 246 U/L Final    Uric Acid 12/28/2022 3.5 (L)  3.7 - 9.2 mg/dL Final    WBC 73/22/0254 2.0 (L)  3.6 - 11.2 10*9/L Final    RBC 12/28/2022 3.04 (L)  4.26 - 5.60 10*12/L Final    HGB 12/28/2022 9.5 (L)  12.9 - 16.5 g/dL Final    HCT 27/04/2375 26.7 (L)  39.0 - 48.0 % Final    MCV 12/28/2022 87.9  77.6 - 95.7 fL Final    MCH 12/28/2022 31.3  25.9 - 32.4 pg Final    MCHC 12/28/2022 35.6  32.0 - 36.0 g/dL Final    RDW 28/31/5176 15.9 (H)  12.2 - 15.2 % Final    MPV 12/28/2022 7.2  6.8 - 10.7 fL Final    Platelet 12/28/2022 47 (L)  150 - 450 10*9/L Final    Questionable result confirmed. Specimen integrity/identity confirmed.  Neutrophils % 12/28/2022 50  % Final    Lymphocytes % 12/28/2022 43  % Final    Monocytes % 12/28/2022 7  % Final    Eosinophils % 12/28/2022 0  % Final    Basophils % 12/28/2022 0  % Final    Absolute Neutrophils 12/28/2022 1.0 (L)  1.8 - 7.8 10*9/L Final    Absolute Lymphocytes 12/28/2022 0.9 (L)  1.1 - 3.6 10*9/L Final    Absolute Monocytes 12/28/2022 0.1 (L)  0.3 - 0.8 10*9/L Final    Absolute Eosinophils 12/28/2022 0.0  0.0 - 0.5 10*9/L Final    Absolute Basophils 12/28/2022 0.0  0.0 - 0.1 10*9/L Final    Smear Review Comments 12/28/2022 See Comment (A)  Undefined Final    [id number] Slide reviewed. Agranular neutrophils present. Myelocytes present-rare.        Basophilic Stippling 12/28/2022 Present (A)  Not Present Final    Toxic Vacuolation 12/28/2022 Present (A)  Not Present Final

## 2023-01-04 ENCOUNTER — Other Ambulatory Visit: Admit: 2023-01-04 | Discharge: 2023-01-05 | Payer: MEDICARE

## 2023-01-04 ENCOUNTER — Ambulatory Visit: Admit: 2023-01-04 | Discharge: 2023-01-05 | Payer: MEDICARE

## 2023-01-04 ENCOUNTER — Encounter
Admit: 2023-01-04 | Discharge: 2023-01-05 | Payer: MEDICARE | Attending: Nurse Practitioner | Primary: Nurse Practitioner

## 2023-01-04 DIAGNOSIS — C9200 Acute myeloblastic leukemia, not having achieved remission: Principal | ICD-10-CM

## 2023-01-04 LAB — LACTATE DEHYDROGENASE: LACTATE DEHYDROGENASE: 236 U/L (ref 120–246)

## 2023-01-04 LAB — MANUAL DIFFERENTIAL
BASOPHILS - ABS (DIFF): 0 10*9/L (ref 0.0–0.1)
BASOPHILS - REL (DIFF): 0 %
EOSINOPHILS - ABS (DIFF): 0 10*9/L (ref 0.0–0.5)
EOSINOPHILS - REL (DIFF): 0 %
LYMPHOCYTES - ABS (DIFF): 0.9 10*9/L — ABNORMAL LOW (ref 1.1–3.6)
LYMPHOCYTES - REL (DIFF): 34 %
MONOCYTES - ABS (DIFF): 0.3 10*9/L (ref 0.3–0.8)
MONOCYTES - REL (DIFF): 11 %
NEUTROPHILS - ABS (DIFF): 1.4 10*9/L — ABNORMAL LOW (ref 1.8–7.8)
NEUTROPHILS - REL (DIFF): 55 %

## 2023-01-04 LAB — COMPREHENSIVE METABOLIC PANEL
ALBUMIN: 3.8 g/dL (ref 3.4–5.0)
ALKALINE PHOSPHATASE: 122 U/L — ABNORMAL HIGH (ref 46–116)
ALT (SGPT): 48 U/L (ref 10–49)
ANION GAP: 6 mmol/L (ref 5–14)
AST (SGOT): 37 U/L — ABNORMAL HIGH (ref <=34)
BILIRUBIN TOTAL: 1.2 mg/dL (ref 0.3–1.2)
BLOOD UREA NITROGEN: 22 mg/dL (ref 9–23)
BUN / CREAT RATIO: 26
CALCIUM: 8.5 mg/dL — ABNORMAL LOW (ref 8.7–10.4)
CHLORIDE: 109 mmol/L — ABNORMAL HIGH (ref 98–107)
CO2: 26 mmol/L (ref 20.0–31.0)
CREATININE: 0.84 mg/dL
EGFR CKD-EPI (2021) MALE: >90 mL/min/{1.73_m2} (ref >=60)
GLUCOSE RANDOM: 128 mg/dL (ref 70–179)
POTASSIUM: 3.3 mmol/L — ABNORMAL LOW (ref 3.4–4.8)
PROTEIN TOTAL: 6.7 g/dL (ref 5.7–8.2)
SODIUM: 141 mmol/L (ref 135–145)

## 2023-01-04 LAB — CBC W/ AUTO DIFF
HEMATOCRIT: 27.1 % — ABNORMAL LOW (ref 39.0–48.0)
HEMOGLOBIN: 9.6 g/dL — ABNORMAL LOW (ref 12.9–16.5)
MEAN CORPUSCULAR HEMOGLOBIN CONC: 35.4 g/dL (ref 32.0–36.0)
MEAN CORPUSCULAR HEMOGLOBIN: 31.6 pg (ref 25.9–32.4)
MEAN CORPUSCULAR VOLUME: 89.2 fL (ref 77.6–95.7)
MEAN PLATELET VOLUME: 6.8 fL (ref 6.8–10.7)
NUCLEATED RED BLOOD CELLS: 1 /100{WBCs}
PLATELET COUNT: 87 10*9/L — ABNORMAL LOW (ref 150–450)
RED BLOOD CELL COUNT: 3.04 10*12/L — ABNORMAL LOW (ref 4.26–5.60)
RED CELL DISTRIBUTION WIDTH: 17 % — ABNORMAL HIGH (ref 12.2–15.2)
WBC ADJUSTED: 2.6 10*9/L — ABNORMAL LOW (ref 3.6–11.2)

## 2023-01-04 LAB — MAGNESIUM: MAGNESIUM: 1.6 mg/dL (ref 1.6–2.6)

## 2023-01-04 LAB — URIC ACID: URIC ACID: 4 mg/dL

## 2023-01-04 LAB — PHOSPHORUS: PHOSPHORUS: 2.8 mg/dL (ref 2.4–5.1)

## 2023-01-04 MED ORDER — METOPROLOL SUCCINATE ER 50 MG TABLET,EXTENDED RELEASE 24 HR
ORAL_TABLET | Freq: Every day | ORAL | 11 refills | 30 days | Status: CP
Start: 2023-01-04 — End: 2024-01-04

## 2023-01-04 MED ORDER — VENETOCLAX 100 MG TABLET
ORAL_TABLET | Freq: Every day | ORAL | 0 refills | 30 days | Status: CP
Start: 2023-01-04 — End: 2023-02-03
  Filled 2023-01-17: qty 120, 30d supply, fill #0

## 2023-01-04 MED ADMIN — potassium chloride (KLOR-CON) packet 40 mEq: 40 meq | ORAL | @ 18:00:00 | Stop: 2023-01-04

## 2023-01-04 MED ADMIN — ondansetron (ZOFRAN) tablet 8 mg: 8 mg | ORAL | @ 18:00:00 | Stop: 2023-01-04

## 2023-01-04 MED ADMIN — azaCITIDine (VIDAZA) syringe: 75 mg/m2 | SUBCUTANEOUS | @ 18:00:00 | Stop: 2023-01-04

## 2023-01-04 NOTE — Unmapped (Signed)
Patient educated on various topics, clinical references attached to patient's MyChart and AVS. Time spent 3 minutes.

## 2023-01-04 NOTE — Unmapped (Signed)
KCL 40 mEq PO dose given per orders for K 3.3.   D1C2 Study Azacitidine 196.5 mg subcutaneous injections placed to RLQ abdomen. Pt tolerated injections without difficulties.   Discharged from clinic in NAD, in stable condition, ambulatory with a cane.

## 2023-01-04 NOTE — Unmapped (Signed)
Brief Adult Oncology Infusion Clinic Note:  Hypokalemia - K at 3.3 today  Potassium chloride 40 meq po once, ordered     Leeroy Cha, FNP

## 2023-01-04 NOTE — Unmapped (Addendum)
You can stop Levofloxacin, this is the antibiotic  You will continue to take the valacyclovir    I want you to restart the venetoclax today.  You will now take 4 of these pills (400mg ) every day.   You will start the next 7 days of injections today as well.     I would like to plan for weekly l                                       ab appts starting after you finish your injections.     We will plan to repeat a marrow after this cycle as well, this is done per the protocol.      I will see you back in 4 weeks.     Lab on 01/04/2023   Component Date Value Ref Range Status    Sodium 01/04/2023 141  135 - 145 mmol/L Final    Potassium 01/04/2023 3.3 (L)  3.4 - 4.8 mmol/L Final    Chloride 01/04/2023 109 (H)  98 - 107 mmol/L Final    CO2 01/04/2023 26.0  20.0 - 31.0 mmol/L Final    Anion Gap 01/04/2023 6  5 - 14 mmol/L Final    BUN 01/04/2023 22  9 - 23 mg/dL Final    Creatinine 54/07/8118 0.84  0.73 - 1.18 mg/dL Final    BUN/Creatinine Ratio 01/04/2023 26   Final    eGFR CKD-EPI (2021) Male 01/04/2023 >90  >=60 mL/min/1.63m2 Final    eGFR calculated with CKD-EPI 2021 equation in accordance with [employer] and [employer] of Nephrology Task Force recommendations.    Glucose 01/04/2023 128  70 - 179 mg/dL Final    Calcium 14/78/2956 8.5 (L)  8.7 - 10.4 mg/dL Final    Albumin 21/30/8657 3.8  3.4 - 5.0 g/dL Final    Total Protein 01/04/2023 6.7  5.7 - 8.2 g/dL Final    Total Bilirubin 01/04/2023 1.2  0.3 - 1.2 mg/dL Final    AST 84/69/6295 37 (H)  <=34 U/L Final    ALT 01/04/2023 48  10 - 49 U/L Final    Alkaline Phosphatase 01/04/2023 122 (H)  46 - 116 U/L Final    Magnesium 01/04/2023 1.6  1.6 - 2.6 mg/dL Final    Phosphorus 28/41/3244 2.8  2.4 - 5.1 mg/dL Final    LDH 11/29/7251 236  120 - 246 U/L Final    Uric Acid 01/04/2023 4.0  3.7 - 9.2 mg/dL Final    WBC 66/44/0347 2.6 (L)  3.6 - 11.2 10*9/L Preliminary    RBC 01/04/2023 3.04 (L)  4.26 - 5.60 10*12/L Preliminary    HGB 01/04/2023 9.6 (L)  12.9 - 16.5 g/dL Preliminary    HCT 42/59/5638 27.1 (L)  39.0 - 48.0 % Preliminary    MCV 01/04/2023 89.2  77.6 - 95.7 fL Preliminary    MCH 01/04/2023 31.6  25.9 - 32.4 pg Preliminary    MCHC 01/04/2023 35.4  32.0 - 36.0 g/dL Preliminary    RDW 75/64/3329 17.0 (H)  12.2 - 15.2 % Preliminary    MPV 01/04/2023 6.8  6.8 - 10.7 fL Preliminary    Platelet 01/04/2023 87 (L)  150 - 450 10*9/L Preliminary    Anisocytosis 01/04/2023 Slight (A)  Not Present Preliminary

## 2023-01-04 NOTE — Unmapped (Signed)
[facility]ukemia Clinic Visit Note     Patient Name: Timothy Porter  Patient Age: 77 y.o.  Encounter Date: 01/04/2023    Primary Care Provider:  Pcp, None Per Patient    Referring Physician:  Pcp, None Per Patient  [address] [place],  [place] [address]    Reason for visit:  AML     Assessment:  Timothy Porter is a 77 y.o. 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  He enrolled on BAML S12 arm (ven x 28 days) and began treatment on 11/22/2022. After 1 cycle of treatment he was found to be in CR without MRD.  He presents to clinic today for follow up.     Timothy Porter tolerated treatment on aza/ven well without toxicities or complications, outside of delayed count recovery.  He had a marrow following his first cycle that revealed CR without MRD.  His 2nd cycle has been delayed to allow for hematologic recovery.  On lab review today he has sufficient count recovery to proceed with cycle 2 on S12.  He will proceed with Azacitidine 75mg /m2 x 7 days + venetoclax 400mg  x 28 days.  He will no longer take voriconazole, levaquin, or allopurinol.  He should continue valtrex.  He will continue with weekly lab appointments.  He will have a marrow for disease assessment per protocol at the completion of cycle 2.  We will plan to see him back in clinic in 4 weeks for consideration of cycle 3.     Free Kappa Chain MM:  He will continue follow up with MM team and has upcoming appt with Timothy Porter on 02/13/2023 Plan:   - proceed with cycle 2 on S12 with azacitidine 75mg /m2 x 7 days + venetoclax 400mg  x 28 days.   - stop voriconazole, allopurinol, and levaquin  - continue valtrex  - weekly lab monitoring  - BMbx 2/27  - RTC 3/6 for consideration of cycle 3.      [name] was available    [name], AGNP-BC  Leukemia Research Nurse Practitioner  Hematology/Oncology Division  [facility]  01/04/2023    I personally spent 50 minutes face-to-face and non-face-to-face in the care of this patient, which includes all pre, intra, and post visit time on the date of service.  All documented time was specific to the E/M visit and does not include any procedures that may have been performed.    Hematology/Oncology History Overview Note   Referring/Local Oncologist:  Timothy Porter     Diagnosis:   Diagnosis   Date Value Ref Range Status   10/26/2022   Final    Bone marrow, left iliac, aspiration and biopsy  -  Hypercellular bone marrow (50%) involved by acute myeloid leukemia with MECOM rearrangement (23% blasts by manual aspirate differential and 10-20% CD34-positive blasts by immunohistochemistry)   -  Mild to focally moderate marrow fibrosis (MF-1 to 2)   -  Less than 5% plasma cells by manual aspirate differential count (see Comment)  -  See linked reports for associated Ancillary Studies.      This electronic signature is attestation that the pathologist personally reviewed the submitted material(s) and the final diagnosis reflects that evaluation.          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,XY[7]         Myeloid Mutational Panel:        Variants of Known/Likely Clinical Significance:   Gene Coding Predicted Protein Variant allele fraction   SRSF2 c.284C>G p.(Pro95Arg) 21.6 %     Treatment Timeline:  11/22/2022 Cycle 1:  BAML S12 azacitidine 75mg /m2 x 7 days + venetoclax 400mg  x 28 days  12/13/22: Bmbx - Limited, though no increase in blasts, MRD-neg by flow, poor specimen but no blasts (at <0.2%)      Multiple myeloma (CMS-HCC)   02/12/2019 Initial Diagnosis    Multiple myeloma (CMS-HCC)     05/28/2019 - 06/03/2019 Chemotherapy    BMT IP AUTO MELPHALAN  Melphalan 140 mg/m2 or 200 mg/m2 IV Day -1     10/22/2019 - 06/27/2022 Chemotherapy    STUDY [id number] IRB# [id number] LENALIDOMIDE (v. 11/04/18)  A Randomized Study of Daratumumab Plus Lenalidomide Versus Lenalidomide Alone as Maintenance Treatment in Patients with Newly Diagnosed Multiple Myeloma Who Are Minimal Residual Disease Positive After Frontline Autologous Stem Cell Transplant.     History of auto stem cell transplant (CMS-HCC)   08/05/2019 Initial Diagnosis    History of auto stem cell transplant (CMS-HCC)     10/22/2019 - 06/27/2022 Chemotherapy    STUDY [id number] IRB# [id number] LENALIDOMIDE (v. 11/04/18)  A Randomized Study of Daratumumab Plus Lenalidomide Versus Lenalidomide Alone as Maintenance Treatment in Patients with Newly Diagnosed Multiple Myeloma Who Are Minimal Residual Disease Positive After Frontline Autologous Stem Cell Transplant.     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 Sandstrom states he has been feeling pretty good. He continues to have some occasional dizzy spells, and notes this more in the mornings.  He reports having loose stools daily, but denies n/v.  No new sob/cough.  No new fevers/chills.     Otherwise, he denies new constitutional symptoms such as anorexia, weight loss, fatigue, night sweats or unexplained fevers.  Furthermore, he denies unexplained bleeding or bruising, recurrent or unexplained intercurrent infections, dyspnea on exertion, lightheadedness, palpitations or chest pain.  There have been no new or unexplained pains or self-identified masses, swelling or enlarged lymph nodes.    ROS reviewed and negative except as noted in H and P     ECOG: 1    Allergies:  No Known Allergies    Medications:   Current Outpatient Medications:     calcium carbonate (TUMS) 200 mg calcium (500 mg) chewable tablet, Chew 2 tablets (400 mg of elem calcium total) Three (3) times a day., Disp: , Rfl:     cholecalciferol, vitamin D3 25 mcg, 1,000 units,, 1,000 unit (25 mcg) tablet, Take 1 tablet (25 mcg total) by mouth., Disp: , Rfl:     cyanocobalamin, vitamin B-12, 2,000 mcg Tab, Take 2,000 mcg by mouth in the morning., Disp: 90 tablet, Rfl: 3    docusate sodium (COLACE) 50 MG capsule, Take 1 capsule (50 mg total) by mouth  two (2) times a day., Disp: , Rfl:     levoFLOXacin (LEVAQUIN) 500 MG tablet, Take 1 tablet (500 mg total) by mouth daily., Disp: 30 tablet, Rfl: 1    loratadine (CLARITIN) 10 mg tablet, Take 1 tablet (10 mg total) by mouth daily., Disp: , Rfl:     metoprolol succinate (TOPROL-XL) 50 MG 24 hr tablet, Take 1 tablet (50 mg total) by mouth., Disp: , Rfl:     multivitamin with minerals tablet, Take 1 tablet by mouth daily., Disp: , Rfl:     omeprazole (PRILOSEC) 40 MG capsule, Take 1 capsule (40 mg total) by mouth daily., Disp: 90 capsule, Rfl: 0    ondansetron (ZOFRAN) 8 MG tablet, Take 1 tablet (8 mg total) by mouth every eight (8) hours as needed for nausea., Disp: 30 tablet, Rfl: 2    sildenafiL, pulm.hypertension, (REVATIO) 20 mg tablet, Take 1-5 tablets (20-100 mg total) by mouth daily as needed (for erectile function)., Disp: 90 tablet, Rfl: 3    tamsulosin (FLOMAX) 0.4 mg capsule, Take 1 capsule (0.4 mg total) by mouth daily., Disp: 90 capsule, Rfl: 0    valACYclovir (VALTREX) 500 MG tablet, Take 1 tablet (500 mg total) by mouth daily., Disp: 90 tablet, Rfl: 3    venetoclax (VENCLEXTA) 100 mg tablet, Take 4 tablets (400 mg total) by mouth daily., Disp: 120 tablet, Rfl: 0  No current facility-administered medications for this visit.    Facility-Administered Medications Ordered in Other Visits:     DTaP-hepatitis B recombinant-IPV (PEDIARIX) 10 mcg-25Lf-25 mcg-10Lf/0.5 mL injection, , , ,     DTaP-hepatitis B recombinant-IPV (PEDIARIX) 10 mcg-25Lf-25 mcg-10Lf/0.5 mL injection, , , ,     haemophilus B polysac-tetanus toxoid (ActHIB) 10 mcg/0.5 mL injection, , , ,     influenza vaccine quad (FLUARIX, FLULAVAL, FLUZONE) (6 MOS & UP) 2020-21 ADS Med, , , ,     pneumococcal conjugate (13-valent) (PREVNAR-13) 0.5 mL vaccine, , , ,     pneumococcal conjugate (13-valent) (PREVNAR-13) 0.5 mL vaccine, , , ,     varicella-zoster gE-AS01B (PF) (SHINGRIX) 50 mcg/0.5 mL injection, , , ,     Medical History:  Past Medical History:   Diagnosis Date    Benign prostatic hyperplasia with lower urinary tract symptoms 11/27/2018    BPH (benign prostatic hyperplasia)     Diarrhea     Fatigue     Fractures     left arm    Glucose intolerance     May 2019: HbA1c 6.2.    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  Planning to build more apartments.       Family History:  Family History   Problem Relation Age of Onset    Dementia Father     No Known Problems Other        Objective:   Vitals:    01/04/23 0941   BP: 157/84   Pulse: 99   Resp: 18   Temp: 36.4 ??C (97.6 ??F)   SpO2: 99%       Physical Exam:  General: Resting, in no apparent distress  HEENT:  PERRL. No scleral icterus or conjunctival injection.   Heart:  RRR.  S1, S2.  No murmurs, gallops or rubs. Mild non-pitting edema noted BLE.   Lungs:  Breathing is unlabored, and patient is speaking full sentences with ease.  CTAB. No rales, ronchi or crackles.    Abdomen:  No distention or pain on palpation.  Bowel sounds are present. No palpable masses.  Skin:  No rashes, petechiae or purpura. Grossly intact.  Musculoskeletal:  strength is equal bilaterally   Psychiatric:  Range of affect is appropriate.    Neurologic:  Alert and oriented x 4.  Steady gait with assistance of cane.    Test Results:  Lab on 01/04/2023   Component Date Value Ref Range Status    Sodium 01/04/2023 141  135 - 145 mmol/L Final    Potassium 01/04/2023 3.3 (L)  3.4 - 4.8 mmol/L Final    Chloride 01/04/2023 109 (H)  98 - 107 mmol/L Final    CO2 01/04/2023 26.0  20.0 - 31.0 mmol/L Final    Anion Gap 01/04/2023 6  5 - 14 mmol/L Final    BUN 01/04/2023 22  9 - 23 mg/dL Final    Creatinine 08/65/7846 0.84  0.73 - 1.18 mg/dL Final    BUN/Creatinine Ratio 01/04/2023 26   Final    eGFR CKD-EPI (2021) Male 01/04/2023 >90  >=60 mL/min/1.48m2 Final    eGFR calculated with CKD-EPI 2021 equation in accordance with [employer] and [employer] of Nephrology Task Force recommendations.    Glucose 01/04/2023 128  70 - 179 mg/dL Final    Calcium 96/29/5284 8.5 (L)  8.7 - 10.4 mg/dL Final    Albumin 13/24/4010 3.8  3.4 - 5.0 g/dL Final    Total Protein 01/04/2023 6.7  5.7 - 8.2 g/dL Final    Total Bilirubin 01/04/2023 1.2  0.3 - 1.2 mg/dL Final    AST 27/25/3664 37 (H)  <=34 U/L Final    ALT 01/04/2023 48  10 - 49 U/L Final    Alkaline Phosphatase 01/04/2023 122 (H)  46 - 116 U/L Final    Magnesium 01/04/2023 1.6  1.6 - 2.6 mg/dL Final    Phosphorus 40/34/7425 2.8  2.4 - 5.1 mg/dL Final    LDH 95/63/8756 236  120 - 246 U/L Final    Uric Acid 01/04/2023 4.0  3.7 - 9.2 mg/dL Final    WBC 43/32/9518 2.6 (L)  3.6 - 11.2 10*9/L Preliminary    RBC 01/04/2023 3.04 (L)  4.26 - 5.60 10*12/L Preliminary    HGB 01/04/2023 9.6 (L)  12.9 - 16.5 g/dL Preliminary    HCT 84/16/6063 27.1 (L)  39.0 - 48.0 % Preliminary    MCV 01/04/2023 89.2  77.6 - 95.7 fL Preliminary    MCH 01/04/2023 31.6  25.9 - 32.4 pg Preliminary    MCHC 01/04/2023 35.4  32.0 - 36.0 g/dL Preliminary    RDW 01/60/1093 17.0 (H)  12.2 - 15.2 % Preliminary    MPV 01/04/2023 6.8  6.8 - 10.7 fL Preliminary    Platelet 01/04/2023 87 (L)  150 - 450 10*9/L Preliminary    Anisocytosis 01/04/2023 Slight (A)  Not Present Preliminary

## 2023-01-04 NOTE — Unmapped (Cosign Needed)
Clinical Study: BAML-16-001-S12: A Randomized Phasae 2 Trial of 28 Day (Arm A) versus 14 Day (Arm B) Schedule of Venetoclax (Ven) + Azacitidine (Aza) in newly diagnosed acute myloid leukemia (AML) patients > 60 years       Cycle / Day: Cycle 2 Day 1- consideration  DOS: 01/04/23  Performance Status: 1  Subject #: [id number]  Cohort / Treatment: 28 days Venetoclax.    Narrative: Patient in clinic today for cycle 2 day 1 consideration of BAMl 16-001-S12 with Timothy Porter. C1 D21 BMBX report demonstrated no evidence of disease,  Though the specimen was poor.  MRD was also negative though enumeration also poor.  He is likely in remission if he recovers his counts.  on 01/04/23- Platelets are 87 and ANC-  1.0 . Physical exam performed.  Labs will be reviewed and patient will be cleared for Aza/Ven treatment of cycle 2 of clinical trial BAML 16001-S12.  At this time patient is feeling good and takes 20 minute walks regularly. He reports intermittent dizziness once in awhile when moving from sitting to standing and was educated by provider to transition positions more slowly to allow blood flow to regulate. If dizzy spells occur on weekends patient instructed to contact triage nurse or report to ED if anything adverse or serious occurs. Pa Continue valacyclovir and stop levofloxacin on 01/04/23. Refill of metropolol requested today. Start 400mg  of venetoclax.      Medical / Surgical History  Medical History Start Date End Date Grade Related to Prior Therapy? (Y / N)   Benign prostatic hyperplasia with lower urinary tract symptoms  07/19/15 05/15/17  No   BPH (benign prostatic hyperplasia)  07/19/15 05/15/17  No   Diarrhea    No   fatigue 09/13/2021   No   Glucose intolerance 03/2018   No   H/O autologous stem cell transplant (CMS-HCC)  04/2019 05/2019     History of Atrial Fibrillation 03/15/2021      Hypertension 07/01/19      Multiple myeloma  02/12/19     prediabetes 03/28/2017             BASELINE LABS       Anemia 11/15/22  3 No White blood cell decrease 11/15/22  2 No   Platelet count decrease 11/15/22  3 No   Neutrophil count decrease 11/15/22  3 No   Lymphocyte cell decrease 11/15/22  1 No                           Oral Study Medication Compliance  Oral Medication # of Pills Dispensed # of Pills Taken Expected Returned Actual Returned % Compliance   Venetoclax 28               Date and reason for any missed dose(s):    Previous diary returned: No- patient given cycle 2 pill diary on 01/04/23. Patient returned pill diary on 12/27/22. ( Unable to delete smart tool so disregard the no  Reason for discrepancies between diary and pill count: N/A  New diary issued: No- YES-cycle 2 diary on 01/04/23  Educational and/or other handouts issued:  Patient education performed: yes 01/04/23      Research Plan:  Begin cycle 2 of BAML-16-001-S12 on 01/04/23            Adverse Events   AE Start Date End Date Grade Attribution Clinically Significant?  (N / Y: Action Taken)   Fatigue 11/22/22  1  Not related N   Low appetite 11/22/22  1 Not related N   Toe infecton 11/22/22 12/20/22 1 Not related N   Vertigo 12/02/22   Not related N           White blood cell count decrease 11/22/22  3 Not related N   Anemia 11/22/22 11/25/22 2 Not related N   Anemia 11/25/22 11/29/22 3 Not related N   Anemia 11/29/22 12/03/22 2 Not related N   Anemia 12/03/22 12/06/22 3 Not related N   Anemia 12/06/22 12/10/22 2 Not related N   Anemia 12/10/22 12/13/22 3 Not related N   Anemia 12/13/22  2 Not related  N   Lymphocyte decrease 11/25/22 12/20/22 2 Not related N   Lymphocyte decrease 12/20/22  1 Not related N   Platelet count decrease 11/22/22  4 Not related N   Neutrophil count decrease 11/22/22 12/03/22 3 Not related N   Neutrophil count decrease 12/03/22  4 Not related N                           All lab values and assessments were reviewed by the investigator and are considered not clinically significant unless otherwise noted.    Concomitant Medications   Medication Dose Start Date End Date Indication Related AE, if any   Calcium Carbonate 400mg  7/3/202  2 tablets for calcium    Cholecalciferol 25 mcg 09/20/20  1 tablet for vitamin D    Cholestyramine 4 grams 07/21/20  4grams in water for diarrhea    B12 2,000 mcg 04/04/22  2,000 mcg by mouth everyday    Diltiazem (Cardizem) 120 Mg   1 tablet by mouth    Tiazec 120 mg   1 tablet by mouth    Ferrous sulfate 325 mg  11/22/22 1 tablet by mouth    Flonase 50 mcg   Intranasal 1x a day    Claritin 10 mg   1 tablet by mouth    Toprol 50 mg   1 tablet by mouth    omeprazole 40 mg   1 tablet by mouth    Revatio 20 mg   1-5 tablets as needed    Tamsulosin .4mg    1 tablet by mouth as needed    Trazodone 50 mg  11/22/22 1 tablet by mouth Not been using   Valtrex 500 mg   1 tablet by mouth    Study Medication        Azacitidine        Venetoclax

## 2023-01-04 NOTE — Unmapped (Signed)
Lab on 01/04/2023   Component Date Value Ref Range Status    Sodium 01/04/2023 141  135 - 145 mmol/L Final    Potassium 01/04/2023 3.3 (L)  3.4 - 4.8 mmol/L Final    Chloride 01/04/2023 109 (H)  98 - 107 mmol/L Final    CO2 01/04/2023 26.0  20.0 - 31.0 mmol/L Final    Anion Gap 01/04/2023 6  5 - 14 mmol/L Final    BUN 01/04/2023 22  9 - 23 mg/dL Final    Creatinine 16/08/9603 0.84  0.73 - 1.18 mg/dL Final    BUN/Creatinine Ratio 01/04/2023 26   Final    eGFR CKD-EPI (2021) Male 01/04/2023 >90  >=60 mL/min/1.73m2 Final    eGFR calculated with CKD-EPI 2021 equation in accordance with [employer] and [employer] of Nephrology Task Force recommendations.    Glucose 01/04/2023 128  70 - 179 mg/dL Final    Calcium 54/07/8118 8.5 (L)  8.7 - 10.4 mg/dL Final    Albumin 14/78/2956 3.8  3.4 - 5.0 g/dL Final    Total Protein 01/04/2023 6.7  5.7 - 8.2 g/dL Final    Total Bilirubin 01/04/2023 1.2  0.3 - 1.2 mg/dL Final    AST 21/30/8657 37 (H)  <=34 U/L Final    ALT 01/04/2023 48  10 - 49 U/L Final    Alkaline Phosphatase 01/04/2023 122 (H)  46 - 116 U/L Final    Magnesium 01/04/2023 1.6  1.6 - 2.6 mg/dL Final    Phosphorus 84/69/6295 2.8  2.4 - 5.1 mg/dL Final    LDH 28/41/3244 236  120 - 246 U/L Final    Uric Acid 01/04/2023 4.0  3.7 - 9.2 mg/dL Final    WBC 11/29/7251 2.6 (L)  3.6 - 11.2 10*9/L Final    RBC 01/04/2023 3.04 (L)  4.26 - 5.60 10*12/L Final    HGB 01/04/2023 9.6 (L)  12.9 - 16.5 g/dL Final    HCT 66/44/0347 27.1 (L)  39.0 - 48.0 % Final    MCV 01/04/2023 89.2  77.6 - 95.7 fL Final    MCH 01/04/2023 31.6  25.9 - 32.4 pg Final    MCHC 01/04/2023 35.4  32.0 - 36.0 g/dL Final    RDW 42/59/5638 17.0 (H)  12.2 - 15.2 % Final    MPV 01/04/2023 6.8  6.8 - 10.7 fL Final    Platelet 01/04/2023 87 (L)  150 - 450 10*9/L Final    nRBC 01/04/2023 1  /100 WBCs Final    Anisocytosis 01/04/2023 Slight (A)  Not Present Final    Antibody Screen 01/04/2023 NEG   Final    Neutrophils % 01/04/2023 55  % Final Lymphocytes % 01/04/2023 34  % Final    Monocytes % 01/04/2023 11  % Final    Eosinophils % 01/04/2023 0  % Final    Basophils % 01/04/2023 0  % Final    Absolute Neutrophils 01/04/2023 1.4 (L)  1.8 - 7.8 10*9/L Final    Absolute Lymphocytes 01/04/2023 0.9 (L)  1.1 - 3.6 10*9/L Final    Absolute Monocytes 01/04/2023 0.3  0.3 - 0.8 10*9/L Final    Absolute Eosinophils 01/04/2023 0.0  0.0 - 0.5 10*9/L Final    Absolute Basophils 01/04/2023 0.0  0.0 - 0.1 10*9/L Final    Smear Review Comments 01/04/2023 See Comment (A)  Undefined Final    Slide [id number] reviewed    Polychromasia 01/04/2023 Slight (A)  Not Present Final  RED ZONE Means: RED ZONE: Take action now!     You need to be seen right away  Symptoms are at a severe level of discomfort    Call 911 or go to your nearest  Hospital for help     - Bleeding that will not stop    - Hard to breathe    - New seizure - Chest pain  - Fall or passing out  -Thoughts of hurting    yourself or others      Call 911 if you are going into the RED ZONE                  YELLOW ZONE Means:     Please call with any new or worsening symptom(s), even if not on this list.  Call [phone]  After hours, weekends, and holidays - you will reach a long recording with specific instructions, If not in an emergency such as above, please listen closely all the way to the end and choose the option that relates to your need.   You can be seen by a provider the same day through our Same Day Acute Care for Patients with Cancer program.      YELLOW ZONE: Take action today     Symptoms are new or worsening  You are not within your goal range for:    - Pain    - Shortness of breath    - Bleeding (nose, urine, stool, wound)    - Feeling sick to your stomach and throwing up    - Mouth sores/pain in your mouth or throat    - Hard stool or very loose stools (increase in       ostomy output)    - No urine for 12 hours    - Feeding tube or other catheter/tube issue    - Redness or pain at previous IV or port/catheter site    - Depressed or anxiety   - Swelling (leg, arm, abdomen,     face, neck)  - Skin rash or skin changes  - Wound issues (redness, drainage,    re-opened)  - Confusion  - Vision changes  - Fever >100.4 F or chills  - Worsening cough with mucus that is    green, yellow, or bloody  - Pain or burning when going to the    bathroom  - Home Infusion Pump Issue- call    [phone]         Call your healthcare provider if you are going into the YELLOW ZONE     GREEN ZONE Means:  Your symptoms are under controls  Continue to take your medicine as ordered  Keep all visits to the provider GREEN ZONE: You are in control  No increase or worsening symptoms  Able to take your medicine  Able to drink and eat    - DO NOT use MyChart messages to report red or yellow symptoms. Allow up to 3    business days for a reply.  -MyChart is for non-urgent medication refills, scheduling requests, or other general questions.         [id number] Rev. 05/26/2022  Approved by Oncology Patient Education Committee

## 2023-01-05 ENCOUNTER — Ambulatory Visit: Admit: 2023-01-05 | Discharge: 2023-01-06 | Payer: MEDICARE

## 2023-01-05 MED ADMIN — ondansetron (ZOFRAN) tablet 8 mg: 8 mg | ORAL | @ 21:00:00 | Stop: 2023-01-05

## 2023-01-05 MED ADMIN — azaCITIDine (VIDAZA) syringe: 75 mg/m2 | SUBCUTANEOUS | @ 21:00:00 | Stop: 2023-01-05

## 2023-01-05 NOTE — Unmapped (Signed)
Pt received in NAD.  Treatment completed without difficulty.  Patient discharged ambulatory in NAD.

## 2023-01-06 ENCOUNTER — Ambulatory Visit: Admit: 2023-01-06 | Discharge: 2023-01-06 | Payer: MEDICARE

## 2023-01-06 MED ADMIN — azaCITIDine (VIDAZA) syringe: 75 mg/m2 | SUBCUTANEOUS | @ 15:00:00 | Stop: 2023-01-06

## 2023-01-06 MED ADMIN — ondansetron (ZOFRAN) tablet 8 mg: 8 mg | ORAL | @ 15:00:00 | Stop: 2023-01-06

## 2023-01-06 NOTE — Unmapped (Signed)
Patient arrived to chair 3.  No complaints noted.  Patient states that he does not need ivf today and declines them today. He states he drinks plenty of fluids.    Patient completed and tolerated treatment.  AVS declined and patient discharged to home.

## 2023-01-07 ENCOUNTER — Ambulatory Visit: Admit: 2023-01-07 | Discharge: 2023-01-08 | Payer: MEDICARE

## 2023-01-07 ENCOUNTER — Encounter
Admit: 2023-01-07 | Discharge: 2023-01-08 | Payer: MEDICARE | Attending: Nurse Practitioner | Primary: Nurse Practitioner

## 2023-01-07 LAB — MANUAL DIFFERENTIAL
BASOPHILS - ABS (DIFF): 0 10*9/L (ref 0.0–0.1)
BASOPHILS - REL (DIFF): 0 %
EOSINOPHILS - ABS (DIFF): 0 10*9/L (ref 0.0–0.5)
EOSINOPHILS - REL (DIFF): 0 %
LYMPHOCYTES - ABS (DIFF): 0.8 10*9/L — ABNORMAL LOW (ref 1.1–3.6)
LYMPHOCYTES - REL (DIFF): 29 %
MONOCYTES - ABS (DIFF): 0.3 10*9/L (ref 0.3–0.8)
MONOCYTES - REL (DIFF): 13 %
NEUTROPHILS - ABS (DIFF): 1.5 10*9/L — ABNORMAL LOW (ref 1.8–7.8)
NEUTROPHILS - REL (DIFF): 58 %

## 2023-01-07 LAB — CBC W/ AUTO DIFF
HEMATOCRIT: 24.6 % — ABNORMAL LOW (ref 39.0–48.0)
HEMOGLOBIN: 8.5 g/dL — ABNORMAL LOW (ref 12.9–16.5)
MEAN CORPUSCULAR HEMOGLOBIN CONC: 34.6 g/dL (ref 32.0–36.0)
MEAN CORPUSCULAR HEMOGLOBIN: 31.7 pg (ref 25.9–32.4)
MEAN CORPUSCULAR VOLUME: 91.6 fL (ref 77.6–95.7)
MEAN PLATELET VOLUME: 7.4 fL (ref 6.8–10.7)
PLATELET COUNT: 99 10*9/L — ABNORMAL LOW (ref 150–450)
RED BLOOD CELL COUNT: 2.68 10*12/L — ABNORMAL LOW (ref 4.26–5.60)
RED CELL DISTRIBUTION WIDTH: 17.9 % — ABNORMAL HIGH (ref 12.2–15.2)
WBC ADJUSTED: 2.6 10*9/L — ABNORMAL LOW (ref 3.6–11.2)

## 2023-01-07 MED ADMIN — azaCITIDine (VIDAZA) syringe: 75 mg/m2 | SUBCUTANEOUS | @ 14:00:00 | Stop: 2023-01-07

## 2023-01-07 MED ADMIN — sodium chloride 0.9% (NS) bolus 500 mL: 500 mL | INTRAVENOUS | @ 14:00:00 | Stop: 2023-01-07

## 2023-01-07 MED ADMIN — ondansetron (ZOFRAN) tablet 8 mg: 8 mg | ORAL | @ 15:00:00 | Stop: 2023-01-07

## 2023-01-07 NOTE — Unmapped (Signed)
8185: no blood products needed. Iv infusing without difficulty

## 2023-01-08 ENCOUNTER — Ambulatory Visit: Admit: 2023-01-08 | Discharge: 2023-01-09 | Payer: MEDICARE

## 2023-01-08 LAB — CBC W/ AUTO DIFF
HEMATOCRIT: 25.1 % — ABNORMAL LOW (ref 39.0–48.0)
HEMOGLOBIN: 8.8 g/dL — ABNORMAL LOW (ref 12.9–16.5)
MEAN CORPUSCULAR HEMOGLOBIN CONC: 34.9 g/dL (ref 32.0–36.0)
MEAN CORPUSCULAR HEMOGLOBIN: 32 pg (ref 25.9–32.4)
MEAN CORPUSCULAR VOLUME: 91.7 fL (ref 77.6–95.7)
MEAN PLATELET VOLUME: 7.2 fL (ref 6.8–10.7)
PLATELET COUNT: 107 10*9/L — ABNORMAL LOW (ref 150–450)
RED BLOOD CELL COUNT: 2.74 10*12/L — ABNORMAL LOW (ref 4.26–5.60)
RED CELL DISTRIBUTION WIDTH: 19.9 % — ABNORMAL HIGH (ref 12.2–15.2)
WBC ADJUSTED: 3.2 10*9/L — ABNORMAL LOW (ref 3.6–11.2)

## 2023-01-08 LAB — MANUAL DIFFERENTIAL
BASOPHILS - ABS (DIFF): 0 10*9/L (ref 0.0–0.1)
BASOPHILS - REL (DIFF): 0 %
EOSINOPHILS - ABS (DIFF): 0 10*9/L (ref 0.0–0.5)
EOSINOPHILS - REL (DIFF): 0 %
LYMPHOCYTES - ABS (DIFF): 1 10*9/L — ABNORMAL LOW (ref 1.1–3.6)
LYMPHOCYTES - REL (DIFF): 30 %
MONOCYTES - ABS (DIFF): 0.2 10*9/L — ABNORMAL LOW (ref 0.3–0.8)
MONOCYTES - REL (DIFF): 6 %
NEUTROPHILS - ABS (DIFF): 2 10*9/L (ref 1.8–7.8)
NEUTROPHILS - REL (DIFF): 64 %

## 2023-01-08 MED ADMIN — ondansetron (ZOFRAN) tablet 8 mg: 8 mg | ORAL | @ 19:00:00 | Stop: 2023-01-08

## 2023-01-08 MED ADMIN — azaCITIDine (VIDAZA) syringe: 75 mg/m2 | SUBCUTANEOUS | @ 19:00:00 | Stop: 2023-01-08

## 2023-01-08 NOTE — Unmapped (Signed)
Pt arrived to chair 15 for aza and labs. No complaints noted. Tolerated inj well. AVS declined and discharged to home.

## 2023-01-08 NOTE — Unmapped (Signed)
Hospital Outpatient Visit on 01/08/2023   Component Date Value Ref Range Status    WBC 01/08/2023 3.2 (L)  3.6 - 11.2 10*9/L Preliminary    RBC 01/08/2023 2.74 (L)  4.26 - 5.60 10*12/L Preliminary    HGB 01/08/2023 8.8 (L)  12.9 - 16.5 g/dL Preliminary    HCT 81/85/6314 25.1 (L)  39.0 - 48.0 % Preliminary    MCV 01/08/2023 91.7  77.6 - 95.7 fL Preliminary    MCH 01/08/2023 32.0  25.9 - 32.4 pg Preliminary    MCHC 01/08/2023 34.9  32.0 - 36.0 g/dL Preliminary    RDW 97/12/6376 19.9 (H)  12.2 - 15.2 % Preliminary    MPV 01/08/2023 7.2  6.8 - 10.7 fL Preliminary    Platelet 01/08/2023 107 (L)  150 - 450 10*9/L Preliminary    Anisocytosis 01/08/2023 Moderate (A)  Not Present Preliminary

## 2023-01-09 ENCOUNTER — Ambulatory Visit: Admit: 2023-01-09 | Discharge: 2023-01-10 | Payer: MEDICARE

## 2023-01-09 MED ADMIN — azaCITIDine (VIDAZA) syringe: 75 mg/m2 | SUBCUTANEOUS | @ 18:00:00 | Stop: 2023-01-09

## 2023-01-09 MED ADMIN — ondansetron (ZOFRAN) tablet 8 mg: 8 mg | ORAL | @ 18:00:00 | Stop: 2023-01-09

## 2023-01-09 NOTE — Unmapped (Signed)
[id number] Study Azacitidine 196.5 mg subcutaneous injections placed to LLQ abdomen. Pt tolerated injections without difficulties.   Discharged from clinic in NAD, in stable condition, ambulatory.

## 2023-01-10 ENCOUNTER — Ambulatory Visit: Admit: 2023-01-10 | Discharge: 2023-01-11 | Payer: MEDICARE

## 2023-01-10 MED ADMIN — azaCITIDine (VIDAZA) syringe: 75 mg/m2 | SUBCUTANEOUS | @ 21:00:00 | Stop: 2023-01-10

## 2023-01-10 MED ADMIN — ondansetron (ZOFRAN) tablet 8 mg: 8 mg | ORAL | @ 21:00:00 | Stop: 2023-01-10

## 2023-01-10 NOTE — Unmapped (Signed)
No visits with results within 1 Day(s) from this visit.   Latest known visit with results is:   Hospital Outpatient Visit on 01/08/2023   Component Date Value Ref Range Status    WBC 01/08/2023 3.2 (L)  3.6 - 11.2 10*9/L Final    RBC 01/08/2023 2.74 (L)  4.26 - 5.60 10*12/L Final    HGB 01/08/2023 8.8 (L)  12.9 - 16.5 g/dL Final    HCT 16/08/9603 25.1 (L)  39.0 - 48.0 % Final    MCV 01/08/2023 91.7  77.6 - 95.7 fL Final    MCH 01/08/2023 32.0  25.9 - 32.4 pg Final    MCHC 01/08/2023 34.9  32.0 - 36.0 g/dL Final    RDW 54/07/8118 19.9 (H)  12.2 - 15.2 % Final    MPV 01/08/2023 7.2  6.8 - 10.7 fL Final    Platelet 01/08/2023 107 (L)  150 - 450 10*9/L Final    Anisocytosis 01/08/2023 Moderate (A)  Not Present Final    Neutrophils % 01/08/2023 64  % Final    Lymphocytes % 01/08/2023 30  % Final    Monocytes % 01/08/2023 6  % Final    Eosinophils % 01/08/2023 0  % Final    Basophils % 01/08/2023 0  % Final    Absolute Neutrophils 01/08/2023 2.0  1.8 - 7.8 10*9/L Final    Absolute Lymphocytes 01/08/2023 1.0 (L)  1.1 - 3.6 10*9/L Final    Absolute Monocytes 01/08/2023 0.2 (L)  0.3 - 0.8 10*9/L Final    Absolute Eosinophils 01/08/2023 0.0  0.0 - 0.5 10*9/L Final    Absolute Basophils 01/08/2023 0.0  0.0 - 0.1 10*9/L Final    Smear Review Comments 01/08/2023 See Comment (A)  Undefined Final    [id number] Slide reviewed.     Toxic Vacuolation 01/08/2023 Present (A)  Not Present Final    Poikilocytosis 01/08/2023 Moderate (A)  Not Present Final          RED ZONE Means: RED ZONE: Take action now!     You need to be seen right away  Symptoms are at a severe level of discomfort    Call 911 or go to your nearest  Hospital for help     - Bleeding that will not stop    - Hard to breathe    - New seizure - Chest pain  - Fall or passing out  -Thoughts of hurting    yourself or others      Call 911 if you are going into the RED ZONE                  YELLOW ZONE Means:     Please call with any new or worsening symptom(s), even if not on this list.  Call [phone]  After hours, weekends, and holidays - you will reach a long recording with specific instructions, If not in an emergency such as above, please listen closely all the way to the end and choose the option that relates to your need.   You can be seen by a provider the same day through our Same Day Acute Care for Patients with Cancer program.      YELLOW ZONE: Take action today     Symptoms are new or worsening  You are not within your goal range for:    - Pain    - Shortness of breath    - Bleeding (nose, urine, stool, wound)    - Feeling sick to  your stomach and throwing up    - Mouth sores/pain in your mouth or throat    - Hard stool or very loose stools (increase in       ostomy output)    - No urine for 12 hours    - Feeding tube or other catheter/tube issue    - Redness or pain at previous IV or port/catheter site    - Depressed or anxiety   - Swelling (leg, arm, abdomen,     face, neck)  - Skin rash or skin changes  - Wound issues (redness, drainage,    re-opened)  - Confusion  - Vision changes  - Fever >100.4 F or chills  - Worsening cough with mucus that is    green, yellow, or bloody  - Pain or burning when going to the    bathroom  - Home Infusion Pump Issue- call    [phone]         Call your healthcare provider if you are going into the YELLOW ZONE     GREEN ZONE Means:  Your symptoms are under controls  Continue to take your medicine as ordered  Keep all visits to the provider GREEN ZONE: You are in control  No increase or worsening symptoms  Able to take your medicine  Able to drink and eat    - DO NOT use MyChart messages to report red or yellow symptoms. Allow up to 3    business days for a reply.  -MyChart is for non-urgent medication refills, scheduling requests, or other general questions.         [id number] Rev. 05/26/2022  Approved by Oncology Patient Education Committee

## 2023-01-11 NOTE — Unmapped (Signed)
No visits with results within 1 Day(s) from this visit.   Latest known visit with results is:   Hospital Outpatient Visit on 01/08/2023   Component Date Value Ref Range Status    WBC 01/08/2023 3.2 (L)  3.6 - 11.2 10*9/L Final    RBC 01/08/2023 2.74 (L)  4.26 - 5.60 10*12/L Final    HGB 01/08/2023 8.8 (L)  12.9 - 16.5 g/dL Final    HCT 16/08/9603 25.1 (L)  39.0 - 48.0 % Final    MCV 01/08/2023 91.7  77.6 - 95.7 fL Final    MCH 01/08/2023 32.0  25.9 - 32.4 pg Final    MCHC 01/08/2023 34.9  32.0 - 36.0 g/dL Final    RDW 54/07/8118 19.9 (H)  12.2 - 15.2 % Final    MPV 01/08/2023 7.2  6.8 - 10.7 fL Final    Platelet 01/08/2023 107 (L)  150 - 450 10*9/L Final    Anisocytosis 01/08/2023 Moderate (A)  Not Present Final    Neutrophils % 01/08/2023 64  % Final    Lymphocytes % 01/08/2023 30  % Final    Monocytes % 01/08/2023 6  % Final    Eosinophils % 01/08/2023 0  % Final    Basophils % 01/08/2023 0  % Final    Absolute Neutrophils 01/08/2023 2.0  1.8 - 7.8 10*9/L Final    Absolute Lymphocytes 01/08/2023 1.0 (L)  1.1 - 3.6 10*9/L Final    Absolute Monocytes 01/08/2023 0.2 (L)  0.3 - 0.8 10*9/L Final    Absolute Eosinophils 01/08/2023 0.0  0.0 - 0.5 10*9/L Final    Absolute Basophils 01/08/2023 0.0  0.0 - 0.1 10*9/L Final    Smear Review Comments 01/08/2023 See Comment (A)  Undefined Final    [id number] Slide reviewed.     Toxic Vacuolation 01/08/2023 Present (A)  Not Present Final    Poikilocytosis 01/08/2023 Moderate (A)  Not Present Final

## 2023-01-11 NOTE — Unmapped (Signed)
Patient tolerated injections. Declined AVS as he uses MyChart. Discharged home.

## 2023-01-14 ENCOUNTER — Encounter
Admit: 2023-01-14 | Discharge: 2023-01-15 | Payer: MEDICARE | Attending: Nurse Practitioner | Primary: Nurse Practitioner

## 2023-01-14 ENCOUNTER — Ambulatory Visit: Admit: 2023-01-14 | Discharge: 2023-01-15 | Payer: MEDICARE

## 2023-01-14 LAB — COMPREHENSIVE METABOLIC PANEL
ALBUMIN: 3.6 g/dL (ref 3.4–5.0)
ALKALINE PHOSPHATASE: 86 U/L (ref 46–116)
ALT (SGPT): 27 U/L (ref 10–49)
ANION GAP: 4 mmol/L — ABNORMAL LOW (ref 5–14)
AST (SGOT): 29 U/L (ref <=34)
BILIRUBIN TOTAL: 1.2 mg/dL (ref 0.3–1.2)
BLOOD UREA NITROGEN: 24 mg/dL — ABNORMAL HIGH (ref 9–23)
BUN / CREAT RATIO: 25
CALCIUM: 8.4 mg/dL — ABNORMAL LOW (ref 8.7–10.4)
CHLORIDE: 109 mmol/L — ABNORMAL HIGH (ref 98–107)
CO2: 27 mmol/L (ref 20.0–31.0)
CREATININE: 0.95 mg/dL
EGFR CKD-EPI (2021) MALE: 83 mL/min/{1.73_m2} (ref >=60)
GLUCOSE RANDOM: 123 mg/dL (ref 70–179)
POTASSIUM: 3.1 mmol/L — ABNORMAL LOW (ref 3.4–4.8)
PROTEIN TOTAL: 6.3 g/dL (ref 5.7–8.2)
SODIUM: 140 mmol/L (ref 135–145)

## 2023-01-14 LAB — CBC W/ AUTO DIFF
BASOPHILS ABSOLUTE COUNT: 0 10*9/L (ref 0.0–0.1)
BASOPHILS RELATIVE PERCENT: 1.1 %
EOSINOPHILS ABSOLUTE COUNT: 0 10*9/L (ref 0.0–0.5)
EOSINOPHILS RELATIVE PERCENT: 0 %
HEMATOCRIT: 23.5 % — ABNORMAL LOW (ref 39.0–48.0)
HEMOGLOBIN: 8.3 g/dL — ABNORMAL LOW (ref 12.9–16.5)
LYMPHOCYTES ABSOLUTE COUNT: 0.6 10*9/L — ABNORMAL LOW (ref 1.1–3.6)
LYMPHOCYTES RELATIVE PERCENT: 31.5 %
MEAN CORPUSCULAR HEMOGLOBIN CONC: 35.2 g/dL (ref 32.0–36.0)
MEAN CORPUSCULAR HEMOGLOBIN: 32 pg (ref 25.9–32.4)
MEAN CORPUSCULAR VOLUME: 91.2 fL (ref 77.6–95.7)
MEAN PLATELET VOLUME: 7 fL (ref 6.8–10.7)
MONOCYTES ABSOLUTE COUNT: 0.1 10*9/L — ABNORMAL LOW (ref 0.3–0.8)
MONOCYTES RELATIVE PERCENT: 6.6 %
NEUTROPHILS ABSOLUTE COUNT: 1.2 10*9/L — ABNORMAL LOW (ref 1.8–7.8)
NEUTROPHILS RELATIVE PERCENT: 60.8 %
PLATELET COUNT: 92 10*9/L — ABNORMAL LOW (ref 150–450)
RED BLOOD CELL COUNT: 2.58 10*12/L — ABNORMAL LOW (ref 4.26–5.60)
RED CELL DISTRIBUTION WIDTH: 22.1 % — ABNORMAL HIGH (ref 12.2–15.2)
WBC ADJUSTED: 1.9 10*9/L — ABNORMAL LOW (ref 3.6–11.2)

## 2023-01-14 LAB — LACTATE DEHYDROGENASE: LACTATE DEHYDROGENASE: 233 U/L (ref 120–246)

## 2023-01-14 LAB — PHOSPHORUS: PHOSPHORUS: 2.8 mg/dL (ref 2.4–5.1)

## 2023-01-14 LAB — SLIDE REVIEW

## 2023-01-14 LAB — URIC ACID: URIC ACID: 4.3 mg/dL

## 2023-01-14 LAB — MAGNESIUM: MAGNESIUM: 1.5 mg/dL — ABNORMAL LOW (ref 1.6–2.6)

## 2023-01-14 NOTE — Unmapped (Signed)
Patient came in for lab check and possible infusion. Patients Hemoglobin was 8.3 and Platelet count was 92. Per therapy plan no interventions needed. Patient was stable at discharge. Patient self-ambulated with friend to the lobby.

## 2023-01-14 NOTE — Unmapped (Signed)
Hospital Outpatient Visit on 01/14/2023   Component Date Value Ref Range Status    WBC 01/14/2023 1.9 (L)  3.6 - 11.2 10*9/L Final    RBC 01/14/2023 2.58 (L)  4.26 - 5.60 10*12/L Final    HGB 01/14/2023 8.3 (L)  12.9 - 16.5 g/dL Final    HCT 16/08/9603 23.5 (L)  39.0 - 48.0 % Final    MCV 01/14/2023 91.2  77.6 - 95.7 fL Final    MCH 01/14/2023 32.0  25.9 - 32.4 pg Final    MCHC 01/14/2023 35.2  32.0 - 36.0 g/dL Final    RDW 54/07/8118 22.1 (H)  12.2 - 15.2 % Final    MPV 01/14/2023 7.0  6.8 - 10.7 fL Final    Platelet 01/14/2023 92 (L)  150 - 450 10*9/L Final    Neutrophils % 01/14/2023 60.8  % Final    Lymphocytes % 01/14/2023 31.5  % Final    Monocytes % 01/14/2023 6.6  % Final    Eosinophils % 01/14/2023 0.0  % Final    Basophils % 01/14/2023 1.1  % Final    Absolute Neutrophils 01/14/2023 1.2 (L)  1.8 - 7.8 10*9/L Final    Absolute Lymphocytes 01/14/2023 0.6 (L)  1.1 - 3.6 10*9/L Final    Absolute Monocytes 01/14/2023 0.1 (L)  0.3 - 0.8 10*9/L Final    Absolute Eosinophils 01/14/2023 0.0  0.0 - 0.5 10*9/L Final    Absolute Basophils 01/14/2023 0.0  0.0 - 0.1 10*9/L Final    Anisocytosis 01/14/2023 Marked (A)  Not Present Final          RED ZONE Means: RED ZONE: Take action now!     You need to be seen right away  Symptoms are at a severe level of discomfort    Call 911 or go to your nearest  Hospital for help     - Bleeding that will not stop    - Hard to breathe    - New seizure - Chest pain  - Fall or passing out  -Thoughts of hurting    yourself or others      Call 911 if you are going into the RED ZONE                  YELLOW ZONE Means:     Please call with any new or worsening symptom(s), even if not on this list.  Call [phone]  After hours, weekends, and holidays - you will reach a long recording with specific instructions, If not in an emergency such as above, please listen closely all the way to the end and choose the option that relates to your need.   You can be seen by a provider the same day through our Same Day Acute Care for Patients with Cancer program.      YELLOW ZONE: Take action today     Symptoms are new or worsening  You are not within your goal range for:    - Pain    - Shortness of breath    - Bleeding (nose, urine, stool, wound)    - Feeling sick to your stomach and throwing up    - Mouth sores/pain in your mouth or throat    - Hard stool or very loose stools (increase in       ostomy output)    - No urine for 12 hours    - Feeding tube or other catheter/tube issue    - Redness or pain  at previous IV or port/catheter site    - Depressed or anxiety   - Swelling (leg, arm, abdomen,     face, neck)  - Skin rash or skin changes  - Wound issues (redness, drainage,    re-opened)  - Confusion  - Vision changes  - Fever >100.4 F or chills  - Worsening cough with mucus that is    green, yellow, or bloody  - Pain or burning when going to the    bathroom  - Home Infusion Pump Issue- call    [phone]         Call your healthcare provider if you are going into the YELLOW ZONE     GREEN ZONE Means:  Your symptoms are under controls  Continue to take your medicine as ordered  Keep all visits to the provider GREEN ZONE: You are in control  No increase or worsening symptoms  Able to take your medicine  Able to drink and eat    - DO NOT use MyChart messages to report red or yellow symptoms. Allow up to 3    business days for a reply.  -MyChart is for non-urgent medication refills, scheduling requests, or other general questions.         [id number] Rev. 05/26/2022  Approved by Oncology Patient Education Committee

## 2023-01-17 NOTE — Unmapped (Signed)
Hi,    Patient Timothy Porter called requesting a medication refill for the following:    Medication: Venclexta  Dosage: 100 mg  Days left of medication: 1  Pharmacy: [facility]    The expected turnaround time is 3-4 business days     Thank you,  Kelli Hope  [facility]  [phone]

## 2023-01-17 NOTE — Unmapped (Signed)
[facility] Clinical Assessment & Refill Coordination Note    Timothy Porter, DOB: [birth year]  Phone: [phone] (home)     All above HIPAA information was verified with patient.     Was a Nurse, learning disability used for this call? No    Specialty Medication(s):   Hematology/Oncology: Venclexta     Current Outpatient Medications   Medication Sig Dispense Refill    calcium carbonate (TUMS) 200 mg calcium (500 mg) chewable tablet Chew 2 tablets (400 mg of elem calcium total) Three (3) times a day.      cholecalciferol, vitamin D3 25 mcg, 1,000 units,, 1,000 unit (25 mcg) tablet Take 1 tablet (25 mcg total) by mouth.      cyanocobalamin, vitamin B-12, 2,000 mcg Tab Take 2,000 mcg by mouth in the morning. 90 tablet 3    docusate sodium (COLACE) 50 MG capsule Take 1 capsule (50 mg total) by mouth two (2) times a day.      levoFLOXacin (LEVAQUIN) 500 MG tablet Take 1 tablet (500 mg total) by mouth daily. 30 tablet 1    loratadine (CLARITIN) 10 mg tablet Take 1 tablet (10 mg total) by mouth daily.      metoPROLOL succinate (TOPROL-XL) 50 MG 24 hr tablet Take 1 tablet (50 mg total) by mouth daily. 30 tablet 11    multivitamin with minerals tablet Take 1 tablet by mouth daily.      omeprazole (PRILOSEC) 40 MG capsule Take 1 capsule (40 mg total) by mouth daily. 90 capsule 0    ondansetron (ZOFRAN) 8 MG tablet Take 1 tablet (8 mg total) by mouth every eight (8) hours as needed for nausea. 30 tablet 2    sildenafiL, pulm.hypertension, (REVATIO) 20 mg tablet Take 1-5 tablets (20-100 mg total) by mouth daily as needed (for erectile function). 90 tablet 3    tamsulosin (FLOMAX) 0.4 mg capsule Take 1 capsule (0.4 mg total) by mouth daily. 90 capsule 0    valACYclovir (VALTREX) 500 MG tablet Take 1 tablet (500 mg total) by mouth daily. 90 tablet 3    venetoclax (VENCLEXTA) 100 mg tablet Take 4 tablets (400 mg total) by mouth daily. 120 tablet 0     No current facility-administered medications for this visit.     Facility-Administered Medications Ordered in Other Visits   Medication Dose Route Frequency Provider Last Rate Last Admin    DTaP-hepatitis B recombinant-IPV (PEDIARIX) 10 mcg-25Lf-25 mcg-10Lf/0.5 mL injection             DTaP-hepatitis B recombinant-IPV (PEDIARIX) 10 mcg-25Lf-25 mcg-10Lf/0.5 mL injection             haemophilus B polysac-tetanus toxoid (ActHIB) 10 mcg/0.5 mL injection             influenza vaccine quad (FLUARIX, FLULAVAL, FLUZONE) (6 MOS & UP) 2020-21 ADS Med             pneumococcal conjugate (13-valent) (PREVNAR-13) 0.5 mL vaccine             pneumococcal conjugate (13-valent) (PREVNAR-13) 0.5 mL vaccine             varicella-zoster gE-AS01B (PF) (SHINGRIX) 50 mcg/0.5 mL injection                 Changes to medications: Jsean reports no changes at this time.    No Known Allergies    Changes to allergies: No    SPECIALTY MEDICATION ADHERENCE     Venetoclax 100 mg: 1  days of medicine on hand     Are there any concerns with adherence? No    Adherence counseling provided? Not needed    Patient-Reported Symptoms Tracker for Cancer Patients on Oral Chemotherapy     Oral chemotherapy medication name(s): venetoclax  Dose and frequency: 400 mg once daily  Oral Chemotherapy Start Date:    Baseline? No  Clinic(s) visited: Hematology    Symptom Grouping Question Patient Response   Digestion and Eating Have you felt sick to your stomach? Did not assess    Had diarrhea? Did not assess    Constipated? Did not assess    Not wanting to eat? Did not assess    Comments      Sleep and Pain Felt very tired even after you rest? Did not assess    Pain due to cancer medication or cancer? Did not assess    Comments     Other Side Effects Numbness or tingling in hands and/or feet? Did not assess    Felt short of breath? Did not assess    Mouth or throat Sores? Did not assess    Rash?      Palmar-plantar erythrodysesthesia syndrome?      Rash - acneiform?      Rash - maculo-papular?      How many days over the past month did your cancer medication or cancer keep you from your normal activities?  Write in number of days, 0-30:  0    Other side effects or things you would like to discuss?      Comments?     Adherence  In the last 30 days, on how many days did you miss at least one dose of any of your [drug name]? Write in number of days, 0-30:  0    What reasons are you having trouble taking your medication [pharmacist: check all that apply]? Specify chemotherapy cycle:        No problems identified    Comments:        Comments       Optional Symptom Tracking Comments: dose increased to 400 mg once daily    CLINICAL MANAGEMENT AND INTERVENTION      Clinical Benefit Assessment:    Do you feel the medicine is effective or helping your condition? Yes    Clinical Benefit counseling provided? Not needed    Acute Infection Status:    Acute infections noted within Epic:  No active infections    Patient reported infection: None    Therapy Appropriateness:    Is therapy appropriate and patient progressing towards therapeutic goals? Yes, therapy is appropriate and should be continued    DISEASE/MEDICATION-SPECIFIC INFORMATION      N/A    Is the patient receiving adequate infection prevention treatment? Not applicable    Does the patient have adequate nutritional support? Not applicable    PATIENT SPECIFIC NEEDS     Does the patient have any physical, cognitive, or cultural barriers? No    Is the patient high risk? Yes, patient is taking oral chemotherapy. Appropriateness of therapy as been assessed    Did the patient require a clinical intervention? No    Does the patient require physician intervention or other additional services (i.e., nutrition, smoking cessation, social work)? No    SOCIAL DETERMINANTS OF HEALTH     At the [facility], we have learned that life circumstances - like trouble affording food, housing, utilities, or transportation can affect the health of many  of our patients.   That is why we wanted to ask: are you currently experiencing any life circumstances that are negatively impacting your health and/or quality of life? Patient declined to answer    Social Determinants of Health     Financial Resource Strain: Low Risk  (11/02/2022)    Overall Financial Resource Strain (CARDIA)     Difficulty of Paying Living Expenses: Not hard at all   Internet Connectivity: No Internet connectivity concern identified (11/02/2022)    Internet Connectivity     Do you have access to internet services: Yes     How do you connect to the internet: Personal Device at home     Is your internet connection strong enough for you to watch video on your device without major problems?: Yes     Do you have enough data to get through the month?: Yes     Does at least one of the devices have a camera that you can use for video chat?: Yes   Food Insecurity: No Food Insecurity (03/04/2021)    Hunger Vital Sign     Worried About Running Out of Food in the Last Year: Never true     Ran Out of Food in the Last Year: Never true   Tobacco Use: Low Risk  (01/04/2023)    Patient History     Smoking Tobacco Use: Never     Smokeless Tobacco Use: Never     Passive Exposure: Not on file   Housing/Utilities: Low Risk  (11/02/2022)    Housing/Utilities     Within the past 12 months, have you ever stayed: outside, in a car, in a tent, in an overnight shelter, or temporarily in someone else's home (i.e. couch-surfing)?: No     Are you worried about losing your housing?: No     Within the past 12 months, have you been unable to get utilities (heat, electricity) when it was really needed?: No   Alcohol Use: Not At Risk (10/17/2019)    Alcohol Use     How often do you have a drink containing alcohol?: Monthly or less     How many drinks containing alcohol do you have on a typical day when you are drinking?: 1 - 2     How often do you have 5 or more drinks on one occasion?: Never   Transportation Needs: No Transportation Needs (09/03/2020)    PRAPARE - Transportation     Lack of Transportation (Medical): No     Lack of Transportation (Non-Medical): No   Substance Use: Not on file   Health Literacy: Low Risk  (03/04/2021)    Health Literacy     : Never   Physical Activity: Insufficiently Active (05/28/2019)    Exercise Vital Sign     Days of Exercise per Week: 7 days     Minutes of Exercise per Session: 20 min   Interpersonal Safety: Not on file   Stress: No Stress Concern Present (05/28/2019)    [employer] of Occupational Health - Occupational Stress Questionnaire     Feeling of Stress : Not at all   Intimate Partner Violence: Not At Risk (03/04/2021)    Humiliation, Afraid, Rape, and Kick questionnaire     Fear of Current or Ex-Partner: No     Emotionally Abused: No     Physically Abused: No     Sexually Abused: No   Depression: Not at risk (05/03/2022)    PHQ-2     PHQ-2  Score: 0   Social Connections: Unknown (05/28/2019)    Social Connection and Isolation Panel [NHANES]     Frequency of Communication with Friends and Family: More than three times a week     Frequency of Social Gatherings with Friends and Family: Twice a week     Attends Religious Services: Patient declined     Database administrator or Organizations: Yes     Attends Engineer, structural: Not on file     Marital Status: Married     Would you be willing to receive help with any of the needs that you have identified today? Not applicable       SHIPPING     Specialty Medication(s) to be Shipped:   Hematology/Oncology: Venclexta    Other medication(s) to be shipped: No additional medications requested for fill at this time     Changes to insurance: No    Patient was notified of new phone menu: Yes    Delivery Scheduled: Yes, Expected medication delivery date: 01/18/23.     Medication will be delivered via Next Day Courier to the confirmed prescription address in [facility].    The patient will receive a drug information handout for each medication shipped and additional FDA Medication Guides as required.  Verified that patient has previously received a Conservation officer, historic buildings and a Surveyor, mining.    The patient or caregiver noted above participated in the development of this care plan and knows that they can request review of or adjustments to the care plan at any time.      All of the patient's questions and concerns have been addressed.    [name], [facility]   [facility] Specialty Pharmacist

## 2023-01-21 ENCOUNTER — Ambulatory Visit: Admit: 2023-01-21 | Discharge: 2023-01-22 | Payer: MEDICARE

## 2023-01-21 ENCOUNTER — Encounter
Admit: 2023-01-21 | Discharge: 2023-01-22 | Payer: MEDICARE | Attending: Nurse Practitioner | Primary: Nurse Practitioner

## 2023-01-21 LAB — CBC W/ AUTO DIFF
BASOPHILS ABSOLUTE COUNT: 0 10*9/L (ref 0.0–0.1)
BASOPHILS RELATIVE PERCENT: 1.2 %
EOSINOPHILS ABSOLUTE COUNT: 0 10*9/L (ref 0.0–0.5)
EOSINOPHILS RELATIVE PERCENT: 0 %
HEMATOCRIT: 25.4 % — ABNORMAL LOW (ref 39.0–48.0)
HEMOGLOBIN: 9 g/dL — ABNORMAL LOW (ref 12.9–16.5)
LYMPHOCYTES ABSOLUTE COUNT: 0.5 10*9/L — ABNORMAL LOW (ref 1.1–3.6)
LYMPHOCYTES RELATIVE PERCENT: 38.1 %
MEAN CORPUSCULAR HEMOGLOBIN CONC: 35.3 g/dL (ref 32.0–36.0)
MEAN CORPUSCULAR HEMOGLOBIN: 33.7 pg — ABNORMAL HIGH (ref 25.9–32.4)
MEAN CORPUSCULAR VOLUME: 95.5 fL (ref 77.6–95.7)
MEAN PLATELET VOLUME: 7 fL (ref 6.8–10.7)
MONOCYTES ABSOLUTE COUNT: 0 10*9/L — ABNORMAL LOW (ref 0.3–0.8)
MONOCYTES RELATIVE PERCENT: 3.6 %
NEUTROPHILS ABSOLUTE COUNT: 0.8 10*9/L — ABNORMAL LOW (ref 1.8–7.8)
NEUTROPHILS RELATIVE PERCENT: 57.1 %
PLATELET COUNT: 57 10*9/L — ABNORMAL LOW (ref 150–450)
RED BLOOD CELL COUNT: 2.66 10*12/L — ABNORMAL LOW (ref 4.26–5.60)
RED CELL DISTRIBUTION WIDTH: 25.9 % — ABNORMAL HIGH (ref 12.2–15.2)
WBC ADJUSTED: 1.3 10*9/L — ABNORMAL LOW (ref 3.6–11.2)

## 2023-01-21 LAB — COMPREHENSIVE METABOLIC PANEL
ALBUMIN: 3.7 g/dL (ref 3.4–5.0)
ALKALINE PHOSPHATASE: 91 U/L (ref 46–116)
ALT (SGPT): 34 U/L (ref 10–49)
ANION GAP: 9 mmol/L (ref 5–14)
AST (SGOT): 30 U/L (ref <=34)
BILIRUBIN TOTAL: 1.2 mg/dL (ref 0.3–1.2)
BLOOD UREA NITROGEN: 17 mg/dL (ref 9–23)
BUN / CREAT RATIO: 20
CALCIUM: 8 mg/dL — ABNORMAL LOW (ref 8.7–10.4)
CHLORIDE: 108 mmol/L — ABNORMAL HIGH (ref 98–107)
CO2: 26 mmol/L (ref 20.0–31.0)
CREATININE: 0.86 mg/dL
EGFR CKD-EPI (2021) MALE: 90 mL/min/{1.73_m2} (ref >=60)
GLUCOSE RANDOM: 121 mg/dL (ref 70–179)
POTASSIUM: 3.1 mmol/L — ABNORMAL LOW (ref 3.4–4.8)
PROTEIN TOTAL: 6.4 g/dL (ref 5.7–8.2)
SODIUM: 143 mmol/L (ref 135–145)

## 2023-01-21 LAB — URIC ACID: URIC ACID: 3.6 mg/dL — ABNORMAL LOW

## 2023-01-21 LAB — PHOSPHORUS: PHOSPHORUS: 2.7 mg/dL (ref 2.4–5.1)

## 2023-01-21 LAB — LACTATE DEHYDROGENASE: LACTATE DEHYDROGENASE: 237 U/L (ref 120–246)

## 2023-01-21 LAB — MAGNESIUM: MAGNESIUM: 1.4 mg/dL — ABNORMAL LOW (ref 1.6–2.6)

## 2023-01-21 MED ADMIN — magnesium oxide (MAG-OX) tablet 400 mg: 400 mg | ORAL | @ 14:00:00 | Stop: 2023-01-21

## 2023-01-21 MED ADMIN — potassium chloride (KLOR-CON) packet 40 mEq: 40 meq | ORAL | @ 14:00:00 | Stop: 2023-01-21

## 2023-01-21 NOTE — Unmapped (Signed)
Hospital Outpatient Visit on 01/21/2023   Component Date Value Ref Range Status    ABO Grouping 01/21/2023 A POS   Final    Sodium 01/21/2023 143  135 - 145 mmol/L Final    Potassium 01/21/2023 3.1 (L)  3.4 - 4.8 mmol/L Final    Chloride 01/21/2023 108 (H)  98 - 107 mmol/L Final    CO2 01/21/2023 26.0  20.0 - 31.0 mmol/L Final    Anion Gap 01/21/2023 9  5 - 14 mmol/L Final    BUN 01/21/2023 17  9 - 23 mg/dL Final    Creatinine 16/08/9603 0.86  0.73 - 1.18 mg/dL Final    BUN/Creatinine Ratio 01/21/2023 20   Final    eGFR CKD-EPI (2021) Male 01/21/2023 90  >=60 mL/min/1.1m2 Final    eGFR calculated with CKD-EPI 2021 equation in accordance with [employer] and [employer] of Nephrology Task Force recommendations.    Glucose 01/21/2023 121  70 - 179 mg/dL Final    Calcium 54/07/8118 8.0 (L)  8.7 - 10.4 mg/dL Final    Albumin 14/78/2956 3.7  3.4 - 5.0 g/dL Final    Total Protein 01/21/2023 6.4  5.7 - 8.2 g/dL Final    Total Bilirubin 01/21/2023 1.2  0.3 - 1.2 mg/dL Final    AST 21/30/8657 30  <=34 U/L Final    ALT 01/21/2023 34  10 - 49 U/L Final    Alkaline Phosphatase 01/21/2023 91  46 - 116 U/L Final    Magnesium 01/21/2023 1.4 (L)  1.6 - 2.6 mg/dL Final    Phosphorus 84/69/6295 2.7  2.4 - 5.1 mg/dL Final    LDH 28/41/3244 237  120 - 246 U/L Final    Uric Acid 01/21/2023 3.6 (L)  3.7 - 9.2 mg/dL Final    WBC 11/29/7251 1.3 (L)  3.6 - 11.2 10*9/L Final    RBC 01/21/2023 2.66 (L)  4.26 - 5.60 10*12/L Final    HGB 01/21/2023 9.0 (L)  12.9 - 16.5 g/dL Final    HCT 66/44/0347 25.4 (L)  39.0 - 48.0 % Final    MCV 01/21/2023 95.5  77.6 - 95.7 fL Final    MCH 01/21/2023 33.7 (H)  25.9 - 32.4 pg Final    MCHC 01/21/2023 35.3  32.0 - 36.0 g/dL Final    RDW 42/59/5638 25.9 (H)  12.2 - 15.2 % Final    MPV 01/21/2023 7.0  6.8 - 10.7 fL Final    Platelet 01/21/2023 57 (L)  150 - 450 10*9/L Final    Neutrophils % 01/21/2023 57.1  % Final    Lymphocytes % 01/21/2023 38.1  % Final    Monocytes % 01/21/2023 3.6  % Final    Eosinophils % 01/21/2023 0.0  % Final    Basophils % 01/21/2023 1.2  % Final    Absolute Neutrophils 01/21/2023 0.8 (L)  1.8 - 7.8 10*9/L Final    Absolute Lymphocytes 01/21/2023 0.5 (L)  1.1 - 3.6 10*9/L Final    Absolute Monocytes 01/21/2023 0.0 (L)  0.3 - 0.8 10*9/L Final    Absolute Eosinophils 01/21/2023 0.0  0.0 - 0.5 10*9/L Final    Absolute Basophils 01/21/2023 0.0  0.0 - 0.1 10*9/L Final    Anisocytosis 01/21/2023 Marked (A)  Not Present Final          RED ZONE Means: RED ZONE: Take action now!     You need to be seen right away  Symptoms are at a severe level of discomfort  Call 911 or go to your nearest  Hospital for help     - Bleeding that will not stop    - Hard to breathe    - New seizure - Chest pain  - Fall or passing out  -Thoughts of hurting    yourself or others      Call 911 if you are going into the RED ZONE                  YELLOW ZONE Means:     Please call with any new or worsening symptom(s), even if not on this list.  Call [phone]  After hours, weekends, and holidays - you will reach a long recording with specific instructions, If not in an emergency such as above, please listen closely all the way to the end and choose the option that relates to your need.   You can be seen by a provider the same day through our Same Day Acute Care for Patients with Cancer program.      YELLOW ZONE: Take action today     Symptoms are new or worsening  You are not within your goal range for:    - Pain    - Shortness of breath    - Bleeding (nose, urine, stool, wound)    - Feeling sick to your stomach and throwing up    - Mouth sores/pain in your mouth or throat    - Hard stool or very loose stools (increase in       ostomy output)    - No urine for 12 hours    - Feeding tube or other catheter/tube issue    - Redness or pain at previous IV or port/catheter site    - Depressed or anxiety   - Swelling (leg, arm, abdomen,     face, neck)  - Skin rash or skin changes  - Wound issues (redness, drainage,    re-opened)  - Confusion  - Vision changes  - Fever >100.4 F or chills  - Worsening cough with mucus that is    green, yellow, or bloody  - Pain or burning when going to the    bathroom  - Home Infusion Pump Issue- call    [phone]         Call your healthcare provider if you are going into the YELLOW ZONE     GREEN ZONE Means:  Your symptoms are under controls  Continue to take your medicine as ordered  Keep all visits to the provider GREEN ZONE: You are in control  No increase or worsening symptoms  Able to take your medicine  Able to drink and eat    - DO NOT use MyChart messages to report red or yellow symptoms. Allow up to 3    business days for a reply.  -MyChart is for non-urgent medication refills, scheduling requests, or other general questions.         [id number] Rev. 05/26/2022  Approved by Oncology Patient Education Committee

## 2023-01-21 NOTE — Unmapped (Signed)
Patient presented for a lab check today. Hgb and platelets were within set parameters of therapy plan, no further interventions needed at this time. However, magnesium was 1.4 and potassium was 3.1; Lucendia Herrlich, NP notified and oral replacements ordered. Patient denied any pain, shortness of breath or nausea during treatment; no s/s of reaction noted. Patient tolerated treatment well and was stable at time of discharge; self-ambulated to lobby.

## 2023-01-23 ENCOUNTER — Ambulatory Visit: Admit: 2023-01-23 | Discharge: 2023-01-24 | Payer: MEDICARE

## 2023-01-23 ENCOUNTER — Encounter: Admit: 2023-01-23 | Discharge: 2023-01-24 | Payer: MEDICARE

## 2023-01-23 ENCOUNTER — Other Ambulatory Visit: Admit: 2023-01-23 | Discharge: 2023-01-24 | Payer: MEDICARE

## 2023-01-23 DIAGNOSIS — C9200 Acute myeloblastic leukemia, not having achieved remission: Principal | ICD-10-CM

## 2023-01-23 LAB — CBC W/ AUTO DIFF
BASOPHILS ABSOLUTE COUNT: 0 10*9/L (ref 0.0–0.1)
BASOPHILS RELATIVE PERCENT: 0.8 %
EOSINOPHILS ABSOLUTE COUNT: 0 10*9/L (ref 0.0–0.5)
EOSINOPHILS RELATIVE PERCENT: 0 %
HEMATOCRIT: 27.8 % — ABNORMAL LOW (ref 39.0–48.0)
HEMOGLOBIN: 9.6 g/dL — ABNORMAL LOW (ref 12.9–16.5)
LYMPHOCYTES ABSOLUTE COUNT: 0.8 10*9/L — ABNORMAL LOW (ref 1.1–3.6)
LYMPHOCYTES RELATIVE PERCENT: 60.5 %
MEAN CORPUSCULAR HEMOGLOBIN CONC: 34.4 g/dL (ref 32.0–36.0)
MEAN CORPUSCULAR HEMOGLOBIN: 33.2 pg — ABNORMAL HIGH (ref 25.9–32.4)
MEAN CORPUSCULAR VOLUME: 96.6 fL — ABNORMAL HIGH (ref 77.6–95.7)
MEAN PLATELET VOLUME: 7 fL (ref 6.8–10.7)
MONOCYTES ABSOLUTE COUNT: 0.1 10*9/L — ABNORMAL LOW (ref 0.3–0.8)
MONOCYTES RELATIVE PERCENT: 4 %
NEUTROPHILS ABSOLUTE COUNT: 0.5 10*9/L — ABNORMAL LOW (ref 1.8–7.8)
NEUTROPHILS RELATIVE PERCENT: 34.7 %
PLATELET COUNT: 67 10*9/L — ABNORMAL LOW (ref 150–450)
RED BLOOD CELL COUNT: 2.88 10*12/L — ABNORMAL LOW (ref 4.26–5.60)
RED CELL DISTRIBUTION WIDTH: 26.4 % — ABNORMAL HIGH (ref 12.2–15.2)
WBC ADJUSTED: 1.4 10*9/L — ABNORMAL LOW (ref 3.6–11.2)

## 2023-01-23 LAB — COMPREHENSIVE METABOLIC PANEL
ALBUMIN: 4.1 g/dL (ref 3.4–5.0)
ALKALINE PHOSPHATASE: 89 U/L (ref 46–116)
ALT (SGPT): 39 U/L (ref 10–49)
ANION GAP: 8 mmol/L (ref 5–14)
AST (SGOT): 36 U/L — ABNORMAL HIGH (ref <=34)
BILIRUBIN TOTAL: 1.6 mg/dL — ABNORMAL HIGH (ref 0.3–1.2)
BLOOD UREA NITROGEN: 17 mg/dL (ref 9–23)
BUN / CREAT RATIO: 18
CALCIUM: 8.8 mg/dL (ref 8.7–10.4)
CHLORIDE: 106 mmol/L (ref 98–107)
CO2: 26 mmol/L (ref 20.0–31.0)
CREATININE: 0.94 mg/dL
EGFR CKD-EPI (2021) MALE: 85 mL/min/{1.73_m2} (ref >=60)
GLUCOSE RANDOM: 119 mg/dL (ref 70–179)
POTASSIUM: 3.1 mmol/L — ABNORMAL LOW (ref 3.4–4.8)
PROTEIN TOTAL: 6.7 g/dL (ref 5.7–8.2)
SODIUM: 140 mmol/L (ref 135–145)

## 2023-01-23 LAB — URIC ACID: URIC ACID: 3.7 mg/dL

## 2023-01-23 LAB — SMEAR - BONE MARROW PATIENT

## 2023-01-23 LAB — PHOSPHORUS: PHOSPHORUS: 2.7 mg/dL (ref 2.4–5.1)

## 2023-01-23 LAB — MAGNESIUM: MAGNESIUM: 1.5 mg/dL — ABNORMAL LOW (ref 1.6–2.6)

## 2023-01-23 LAB — SLIDE REVIEW

## 2023-01-23 LAB — LACTATE DEHYDROGENASE: LACTATE DEHYDROGENASE: 257 U/L — ABNORMAL HIGH (ref 120–246)

## 2023-01-23 NOTE — Unmapped (Addendum)
Please leave dressing in place and keep it dry for 24 hrs before removing. You can resume normal activities tomorrow, but take things easy today.     You may take tylenol as needed for discomfort at the area where the biopsy was taken.   After receiving Ativan, please do not drive today.     If you have questions between 8am to 5 pm Monday through Friday please call [phone] and speak to the operator.      For emergencies, evenings or weekends, please call [phone] and ask for oncology fellow on call.     Reasons to call emergency line may include:   Fever of 100.5 or greater   Nausea and/or vomiting not relieved with nausea medicine   Diarrhea or constipation   Severe pain not relieved with usual pain regimen       Lab on 01/23/2023   Component Date Value Ref Range Status    Sodium 01/23/2023 140  135 - 145 mmol/L Final    Potassium 01/23/2023 3.1 (L)  3.4 - 4.8 mmol/L Final    Chloride 01/23/2023 106  98 - 107 mmol/L Final    CO2 01/23/2023 26.0  20.0 - 31.0 mmol/L Final    Anion Gap 01/23/2023 8  5 - 14 mmol/L Final    BUN 01/23/2023 17  9 - 23 mg/dL Final    Creatinine 29/56/2130 0.94  0.73 - 1.18 mg/dL Final    BUN/Creatinine Ratio 01/23/2023 18   Final    eGFR CKD-EPI (2021) Male 01/23/2023 85  >=60 mL/min/1.11m2 Final    eGFR calculated with CKD-EPI 2021 equation in accordance with [employer] and [employer] Nephrology Task Force recommendations.    Glucose 01/23/2023 119  70 - 179 mg/dL Final    Calcium 86/57/8469 8.8  8.7 - 10.4 mg/dL Final    Albumin 62/95/2841 4.1  3.4 - 5.0 g/dL Final    Total Protein 01/23/2023 6.7  5.7 - 8.2 g/dL Final    Total Bilirubin 01/23/2023 1.6 (H)  0.3 - 1.2 mg/dL Final    AST 32/44/0102 36 (H)  <=34 U/L Final    ALT 01/23/2023 39  10 - 49 U/L Final    Alkaline Phosphatase 01/23/2023 89  46 - 116 U/L Final    Magnesium 01/23/2023 1.5 (L)  1.6 - 2.6 mg/dL Final    Phosphorus 72/53/6644 2.7  2.4 - 5.1 mg/dL Final    LDH 03/47/4259 257 (H)  120 - 246 U/L Final    Uric Acid 01/23/2023 3.7  3.7 - 9.2 mg/dL Final    WBC 56/38/7564 1.4 (L)  3.6 - 11.2 10*9/L Final    RBC 01/23/2023 2.88 (L)  4.26 - 5.60 10*12/L Final    HGB 01/23/2023 9.6 (L)  12.9 - 16.5 g/dL Final    HCT 33/29/5188 27.8 (L)  39.0 - 48.0 % Final    MCV 01/23/2023 96.6 (H)  77.6 - 95.7 fL Final    MCH 01/23/2023 33.2 (H)  25.9 - 32.4 pg Final    MCHC 01/23/2023 34.4  32.0 - 36.0 g/dL Final    RDW 41/66/0630 26.4 (H)  12.2 - 15.2 % Final    MPV 01/23/2023 7.0  6.8 - 10.7 fL Final    Platelet 01/23/2023 67 (L)  150 - 450 10*9/L Final    Neutrophils % 01/23/2023 34.7  % Final    Lymphocytes % 01/23/2023 60.5  % Final    Monocytes % 01/23/2023 4.0  %  Final    Eosinophils % 01/23/2023 0.0  % Final    Basophils % 01/23/2023 0.8  % Final    Absolute Neutrophils 01/23/2023 0.5 (L)  1.8 - 7.8 10*9/L Final    Absolute Lymphocytes 01/23/2023 0.8 (L)  1.1 - 3.6 10*9/L Final    Absolute Monocytes 01/23/2023 0.1 (L)  0.3 - 0.8 10*9/L Final    Absolute Eosinophils 01/23/2023 0.0  0.0 - 0.5 10*9/L Final    Absolute Basophils 01/23/2023 0.0  0.0 - 0.1 10*9/L Final    Anisocytosis 01/23/2023 Marked (A)  Not Present Final

## 2023-01-23 NOTE — Unmapped (Signed)
Consent for BMBx witnessed. Time out completed. No pre-meds given. PIV flushed, discontinued.

## 2023-01-23 NOTE — Unmapped (Signed)
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 All questions were answered. The patient verbalized understanding and signed informed consent. After a time-out in which his patient identifiers were checked by 2 providers, the patient was laid in prone position on the table.   The  posterior superior iliac spine and iliac crest were cleaned, prepped and draped in the usual sterile fashion.     Anesthetic agent used: 2% plain lidocaine.      Utilizing a Ranfac needle, a bone marrow aspiration and biopsy was performed.  Specimen was sent for routine histopathologic stains and sectioning, flow cytometry, cytogenetics, and molecular analysis.     A pressure dressing was applied to the biopsy site.  Patient tolerated the procedure well.  Hemostasis was confirmed upon discharge.     The patient was given verbal instructions for wound care, such as to keep the biopsy site dry, covered for 24 hours, and to call your physician for a temperature > 100.5.  Tylenol may be taken for discomfort.    Specimens Collected:  EDTA x 2  Heparin x 2  Core biopsy x 1

## 2023-01-26 DIAGNOSIS — C9200 Acute myeloblastic leukemia, not having achieved remission: Principal | ICD-10-CM

## 2023-01-28 ENCOUNTER — Ambulatory Visit: Admit: 2023-01-28 | Discharge: 2023-01-29 | Payer: MEDICARE

## 2023-01-28 ENCOUNTER — Encounter
Admit: 2023-01-28 | Discharge: 2023-01-29 | Payer: MEDICARE | Attending: Nurse Practitioner | Primary: Nurse Practitioner

## 2023-01-28 LAB — COMPREHENSIVE METABOLIC PANEL
ALBUMIN: 3.8 g/dL (ref 3.4–5.0)
ALKALINE PHOSPHATASE: 86 U/L (ref 46–116)
ALT (SGPT): 38 U/L (ref 10–49)
ANION GAP: 7 mmol/L (ref 5–14)
AST (SGOT): 32 U/L (ref <=34)
BILIRUBIN TOTAL: 1.2 mg/dL (ref 0.3–1.2)
BLOOD UREA NITROGEN: 18 mg/dL (ref 9–23)
BUN / CREAT RATIO: 23
CALCIUM: 8.5 mg/dL — ABNORMAL LOW (ref 8.7–10.4)
CHLORIDE: 107 mmol/L (ref 98–107)
CO2: 26 mmol/L (ref 20.0–31.0)
CREATININE: 0.77 mg/dL
EGFR CKD-EPI (2021) MALE: >90 mL/min/{1.73_m2} (ref >=60)
GLUCOSE RANDOM: 117 mg/dL (ref 70–179)
POTASSIUM: 3.4 mmol/L (ref 3.4–4.8)
PROTEIN TOTAL: 6.4 g/dL (ref 5.7–8.2)
SODIUM: 140 mmol/L (ref 135–145)

## 2023-01-28 LAB — CBC W/ AUTO DIFF
BASOPHILS ABSOLUTE COUNT: 0 10*9/L (ref 0.0–0.1)
BASOPHILS RELATIVE PERCENT: 1.3 %
EOSINOPHILS ABSOLUTE COUNT: 0 10*9/L (ref 0.0–0.5)
EOSINOPHILS RELATIVE PERCENT: 0 %
HEMATOCRIT: 28 % — ABNORMAL LOW (ref 39.0–48.0)
HEMOGLOBIN: 9.6 g/dL — ABNORMAL LOW (ref 12.9–16.5)
LYMPHOCYTES ABSOLUTE COUNT: 0.7 10*9/L — ABNORMAL LOW (ref 1.1–3.6)
LYMPHOCYTES RELATIVE PERCENT: 77.4 %
MEAN CORPUSCULAR HEMOGLOBIN CONC: 34.2 g/dL (ref 32.0–36.0)
MEAN CORPUSCULAR HEMOGLOBIN: 33.5 pg — ABNORMAL HIGH (ref 25.9–32.4)
MEAN CORPUSCULAR VOLUME: 97.8 fL — ABNORMAL HIGH (ref 77.6–95.7)
MEAN PLATELET VOLUME: 7.1 fL (ref 6.8–10.7)
MONOCYTES ABSOLUTE COUNT: 0 10*9/L — ABNORMAL LOW (ref 0.3–0.8)
MONOCYTES RELATIVE PERCENT: 1.1 %
NEUTROPHILS ABSOLUTE COUNT: 0.2 10*9/L — CL (ref 1.8–7.8)
NEUTROPHILS RELATIVE PERCENT: 20.2 %
PLATELET COUNT: 131 10*9/L — ABNORMAL LOW (ref 150–450)
RED BLOOD CELL COUNT: 2.86 10*12/L — ABNORMAL LOW (ref 4.26–5.60)
RED CELL DISTRIBUTION WIDTH: 25.7 % — ABNORMAL HIGH (ref 12.2–15.2)
WBC ADJUSTED: 0.9 10*9/L — ABNORMAL LOW (ref 3.6–11.2)

## 2023-01-28 LAB — URIC ACID: URIC ACID: 3.5 mg/dL — ABNORMAL LOW

## 2023-01-28 LAB — LACTATE DEHYDROGENASE: LACTATE DEHYDROGENASE: 226 U/L (ref 120–246)

## 2023-01-28 LAB — MAGNESIUM: MAGNESIUM: 1.6 mg/dL (ref 1.6–2.6)

## 2023-01-28 LAB — SLIDE REVIEW

## 2023-01-29 NOTE — Unmapped (Signed)
Patient arrived for IV and lab draw. Placed a # 22 left FA. Patient did not require blood or platelets today. Denies any acute distress. IV dc'd. Patient left ambulatory.

## 2023-01-29 NOTE — Unmapped (Signed)
Hospital Outpatient Visit on 01/28/2023   Component Date Value Ref Range Status    ABO Grouping 01/28/2023 A POS   Final    Antibody Screen 01/28/2023 NEG   Final    Sodium 01/28/2023 140  135 - 145 mmol/L Final    Potassium 01/28/2023 3.4  3.4 - 4.8 mmol/L Final    Chloride 01/28/2023 107  98 - 107 mmol/L Final    CO2 01/28/2023 26.0  20.0 - 31.0 mmol/L Final    Anion Gap 01/28/2023 7  5 - 14 mmol/L Final    BUN 01/28/2023 18  9 - 23 mg/dL Final    Creatinine 16/08/9603 0.77  0.73 - 1.18 mg/dL Final    BUN/Creatinine Ratio 01/28/2023 23   Final    eGFR CKD-EPI (2021) Male 01/28/2023 >90  >=60 mL/min/1.65m2 Final    eGFR calculated with CKD-EPI 2021 equation in accordance with [employer] and [employer] Task Force recommendations.    Glucose 01/28/2023 117  70 - 179 mg/dL Final    Calcium 54/07/8118 8.5 (L)  8.7 - 10.4 mg/dL Final    Albumin 14/78/2956 3.8  3.4 - 5.0 g/dL Final    Total Protein 01/28/2023 6.4  5.7 - 8.2 g/dL Final    Total Bilirubin 01/28/2023 1.2  0.3 - 1.2 mg/dL Final    AST 21/30/8657 32  <=34 U/L Final    ALT 01/28/2023 38  10 - 49 U/L Final    Alkaline Phosphatase 01/28/2023 86  46 - 116 U/L Final    LDH 01/28/2023 226  120 - 246 U/L Final    Uric Acid 01/28/2023 3.5 (L)  3.7 - 9.2 mg/dL Final    Magnesium 84/69/6295 1.6  1.6 - 2.6 mg/dL Final    WBC 28/41/3244 0.9 (L)  3.6 - 11.2 10*9/L Final    RBC 01/28/2023 2.86 (L)  4.26 - 5.60 10*12/L Final    HGB 01/28/2023 9.6 (L)  12.9 - 16.5 g/dL Final    HCT 11/29/7251 28.0 (L)  39.0 - 48.0 % Final    MCV 01/28/2023 97.8 (H)  77.6 - 95.7 fL Final    MCH 01/28/2023 33.5 (H)  25.9 - 32.4 pg Final    MCHC 01/28/2023 34.2  32.0 - 36.0 g/dL Final    RDW 66/44/0347 25.7 (H)  12.2 - 15.2 % Final    MPV 01/28/2023 7.1  6.8 - 10.7 fL Final    Platelet 01/28/2023 131 (L)  150 - 450 10*9/L Final    Neutrophils % 01/28/2023 20.2  % Final    Lymphocytes % 01/28/2023 77.4  % Final    Monocytes % 01/28/2023 1.1  % Final Eosinophils % 01/28/2023 0.0  % Final    Basophils % 01/28/2023 1.3  % Final    Absolute Neutrophils 01/28/2023 0.2 (LL)  1.8 - 7.8 10*9/L Final    Absolute Lymphocytes 01/28/2023 0.7 (L)  1.1 - 3.6 10*9/L Final    Absolute Monocytes 01/28/2023 0.0 (L)  0.3 - 0.8 10*9/L Final    Absolute Eosinophils 01/28/2023 0.0  0.0 - 0.5 10*9/L Final    Absolute Basophils 01/28/2023 0.0  0.0 - 0.1 10*9/L Final    Anisocytosis 01/28/2023 Marked (A)  Not Present Final    Smear Review Comments 01/28/2023 See Comment (A)  Undefined Final    Slide reviewed  Irregularly contracted RBCs present.  Instrument ID   [id number]

## 2023-01-31 ENCOUNTER — Other Ambulatory Visit: Admit: 2023-01-31 | Discharge: 2023-01-31 | Payer: MEDICARE

## 2023-01-31 ENCOUNTER — Ambulatory Visit: Admit: 2023-01-31 | Discharge: 2023-01-31 | Payer: MEDICARE

## 2023-01-31 DIAGNOSIS — C9200 Acute myeloblastic leukemia, not having achieved remission: Principal | ICD-10-CM

## 2023-01-31 LAB — COMPREHENSIVE METABOLIC PANEL
ALBUMIN: 3.9 g/dL (ref 3.4–5.0)
ALKALINE PHOSPHATASE: 92 U/L (ref 46–116)
ALT (SGPT): 43 U/L (ref 10–49)
ANION GAP: 5 mmol/L (ref 5–14)
AST (SGOT): 32 U/L (ref <=34)
BILIRUBIN TOTAL: 1.4 mg/dL — ABNORMAL HIGH (ref 0.3–1.2)
BLOOD UREA NITROGEN: 17 mg/dL (ref 9–23)
BUN / CREAT RATIO: 19
CALCIUM: 8.5 mg/dL — ABNORMAL LOW (ref 8.7–10.4)
CHLORIDE: 108 mmol/L — ABNORMAL HIGH (ref 98–107)
CO2: 29 mmol/L (ref 20.0–31.0)
CREATININE: 0.9 mg/dL
EGFR CKD-EPI (2021) MALE: 89 mL/min/{1.73_m2} (ref >=60)
GLUCOSE RANDOM: 108 mg/dL (ref 70–179)
POTASSIUM: 3.4 mmol/L (ref 3.4–4.8)
PROTEIN TOTAL: 6.6 g/dL (ref 5.7–8.2)
SODIUM: 142 mmol/L (ref 135–145)

## 2023-01-31 LAB — CBC W/ AUTO DIFF
BASOPHILS ABSOLUTE COUNT: 0 10*9/L (ref 0.0–0.1)
BASOPHILS RELATIVE PERCENT: 0.5 %
EOSINOPHILS ABSOLUTE COUNT: 0 10*9/L (ref 0.0–0.5)
EOSINOPHILS RELATIVE PERCENT: 0 %
HEMATOCRIT: 28.6 % — ABNORMAL LOW (ref 39.0–48.0)
HEMOGLOBIN: 10 g/dL — ABNORMAL LOW (ref 12.9–16.5)
LYMPHOCYTES ABSOLUTE COUNT: 0.9 10*9/L — ABNORMAL LOW (ref 1.1–3.6)
LYMPHOCYTES RELATIVE PERCENT: 83 %
MEAN CORPUSCULAR HEMOGLOBIN CONC: 34.9 g/dL (ref 32.0–36.0)
MEAN CORPUSCULAR HEMOGLOBIN: 34.7 pg — ABNORMAL HIGH (ref 25.9–32.4)
MEAN CORPUSCULAR VOLUME: 99.2 fL — ABNORMAL HIGH (ref 77.6–95.7)
MEAN PLATELET VOLUME: 7.3 fL (ref 6.8–10.7)
MONOCYTES ABSOLUTE COUNT: 0.1 10*9/L — ABNORMAL LOW (ref 0.3–0.8)
MONOCYTES RELATIVE PERCENT: 6.9 %
NEUTROPHILS ABSOLUTE COUNT: 0.1 10*9/L — CL (ref 1.8–7.8)
NEUTROPHILS RELATIVE PERCENT: 9.6 %
PLATELET COUNT: 167 10*9/L (ref 150–450)
RED BLOOD CELL COUNT: 2.88 10*12/L — ABNORMAL LOW (ref 4.26–5.60)
RED CELL DISTRIBUTION WIDTH: 25.4 % — ABNORMAL HIGH (ref 12.2–15.2)
WBC ADJUSTED: 1 10*9/L — ABNORMAL LOW (ref 3.6–11.2)

## 2023-01-31 LAB — URIC ACID: URIC ACID: 3.9 mg/dL

## 2023-01-31 LAB — LACTATE DEHYDROGENASE: LACTATE DEHYDROGENASE: 224 U/L (ref 120–246)

## 2023-01-31 LAB — PHOSPHORUS: PHOSPHORUS: 3.1 mg/dL (ref 2.4–5.1)

## 2023-01-31 LAB — SLIDE REVIEW

## 2023-01-31 LAB — MAGNESIUM: MAGNESIUM: 1.6 mg/dL (ref 1.6–2.6)

## 2023-01-31 NOTE — Unmapped (Signed)
1226 PIV removed intact. Dressing applied after hemostasis.

## 2023-01-31 NOTE — Unmapped (Signed)
I want you to stop your venetoclax after your dose tonight.  We are not going to be able to start the next cycle of treatment today.  This is because you remain in a good deep remission so now we need to allow your bone marrow to recover a bit.     I also want you to restart your levofloxacin    I will plan for you to come back to clinic in 1 week to reassess your counts to see if you can start the next cycle of treatment.     You don't need any lab monitoring between now and next week.     Lab on 01/31/2023   Component Date Value Ref Range Status    Sodium 01/31/2023 142  135 - 145 mmol/L Final    Potassium 01/31/2023 3.4  3.4 - 4.8 mmol/L Final    Chloride 01/31/2023 108 (H)  98 - 107 mmol/L Final    CO2 01/31/2023 29.0  20.0 - 31.0 mmol/L Final    Anion Gap 01/31/2023 5  5 - 14 mmol/L Final    BUN 01/31/2023 17  9 - 23 mg/dL Final    Creatinine 36/64/4034 0.90  0.73 - 1.18 mg/dL Final    BUN/Creatinine Ratio 01/31/2023 19   Final    eGFR CKD-EPI (2021) Male 01/31/2023 89  >=60 mL/min/1.20m2 Final    eGFR calculated with CKD-EPI 2021 equation in accordance with [employer] and [employer] Task Force recommendations.    Glucose 01/31/2023 108  70 - 179 mg/dL Final    Calcium 74/25/9563 8.5 (L)  8.7 - 10.4 mg/dL Final    Albumin 87/56/4332 3.9  3.4 - 5.0 g/dL Final    Total Protein 01/31/2023 6.6  5.7 - 8.2 g/dL Final    Total Bilirubin 01/31/2023 1.4 (H)  0.3 - 1.2 mg/dL Final    AST 95/18/8416 32  <=34 U/L Final    ALT 01/31/2023 43  10 - 49 U/L Final    Alkaline Phosphatase 01/31/2023 92  46 - 116 U/L Final    Magnesium 01/31/2023 1.6  1.6 - 2.6 mg/dL Final    Phosphorus 60/63/0160 3.1  2.4 - 5.1 mg/dL Final    LDH 10/93/2355 224  120 - 246 U/L Final    Uric Acid 01/31/2023 3.9  3.7 - 9.2 mg/dL Final    WBC 73/22/0254 1.0 (L)  3.6 - 11.2 10*9/L Preliminary    RBC 01/31/2023 2.88 (L)  4.26 - 5.60 10*12/L Preliminary    HGB 01/31/2023 10.0 (L)  12.9 - 16.5 g/dL Preliminary    HCT 27/04/2375 28.6 (L)  39.0 - 48.0 % Preliminary    MCV 01/31/2023 99.2 (H)  77.6 - 95.7 fL Preliminary    MCH 01/31/2023 34.7 (H)  25.9 - 32.4 pg Preliminary    MCHC 01/31/2023 34.9  32.0 - 36.0 g/dL Preliminary    RDW 28/31/5176 25.4 (H)  12.2 - 15.2 % Preliminary    MPV 01/31/2023 7.3  6.8 - 10.7 fL Preliminary    Platelet 01/31/2023 167  150 - 450 10*9/L Preliminary    Anisocytosis 01/31/2023 Marked (A)  Not Present Preliminary

## 2023-01-31 NOTE — Unmapped (Incomplete)
Clinical Study: BAML-16-001-S12: A Randomized Phasae 2 Trial of 28 Day (Arm A) versus 14 Day (Arm B) Schedule of Venetoclax (Ven) + Azacitidine (Aza) in newly diagnosed acute myloid leukemia (AML) patients > 60 years       Cycle / Day: End of treatment cycle 2. End of clinical trial BAML 16-001-S12  DOS: 01/31/23  Performance Status: 1  Subject #: [id number]  Cohort / Treatment: 28 days Venetoclax.    Narrative: Patient in clinic today for  end of cycle 2 which is completion of  clinical trial BAML 16-001-S12 with Oliver Hum. C2 D21 BMBX report demonstrated no evidence of disease.   MRD was also negative though enumeration also poor.  Platelets are 87 and ANC-  1.0 . Physical exam performed.  Today platelets are 167 WBC are 1.0. Provider recommendation is to hold cycle 3 until counts recover. Patient will stop venetoclax today and hold until counts recover. Patient instructed to wait until neutrophils recover before going to the dentist. Physical exam performed. Provider encouraged patient to drink water.       I will need to schedule cycle 3 with Oliver Hum*    At this time patient is feeling good and takes 20 minute walks regularly. He reports intermittent dizziness once in awhile when moving from sitting to standing and was educated by provider to transition positions more slowly to allow blood flow to regulate. If dizzy spells occur on weekends patient instructed to contact triage nurse or report to ED if anything adverse or serious occurs. Pa Continue valacyclovir and stop levofloxacin on 01/04/23.      Medical / Surgical History  Medical History Start Date End Date Grade Related to Prior Therapy? (Y / N)   Benign prostatic hyperplasia with lower urinary tract symptoms  07/19/15 05/15/17  No   BPH (benign prostatic hyperplasia)  07/19/15 05/15/17  No   Diarrhea    No   fatigue 09/13/2021   No   Glucose intolerance 03/2018   No   H/O autologous stem cell transplant (CMS-HCC)  04/2019 05/2019     History of Atrial Fibrillation 03/15/2021      Hypertension 07/01/19      Multiple myeloma  02/12/19     prediabetes 03/28/2017             BASELINE LABS       Anemia 11/15/22  3 No   White blood cell decrease 11/15/22  2 No   Platelet count decrease 11/15/22  3 No   Neutrophil count decrease 11/15/22  3 No   Lymphocyte cell decrease 11/15/22  1 No                           Oral Study Medication Compliance  Oral Medication # of Pills Dispensed # of Pills Taken Expected Returned Actual Returned % Compliance   Venetoclax 28               Date and reason for any missed dose(s):    Previous diary returned: No- patient given cycle 2 pill diary on 01/04/23. Patient returned pill diary on 01/31/23. ( Unable to delete smart tool so disregard the no)  Reason for discrepancies between diary and pill count: N/A  New diary issued: No- today is end of cycle 2. Pill diary for cycle 2 returned. No diary issued for standard of care cycle 3. Patient reports taken venetoclax daily as directed.  Educational and/or other handouts issued:  Patient education performed: yes 01/04/23      Research Plan:  Patient will transition to stand of care for cycle 3 treatment      Adverse Events   AE Start Date End Date Grade Attribution Clinically Significant?  (N / Y: Action Taken)   Fatigue 11/22/22  1 Not related N   Low appetite 11/22/22  1 Not related N   Toe infecton 11/22/22 12/20/22 1 Not related N   Vertigo 12/02/22   Not related N           White blood cell count decrease 11/22/22  3 Not related N   Anemia 11/22/22 11/25/22 2 Not related N   Anemia 11/25/22 11/29/22 3 Not related N   Anemia 11/29/22 12/03/22 2 Not related N   Anemia 12/03/22 12/06/22 3 Not related N   Anemia 12/06/22 12/10/22 2 Not related N   Anemia 12/10/22 12/13/22 3 Not related N   Anemia 12/13/22  2 Not related  N   Lymphocyte decrease 11/25/22 12/20/22 2 Not related N   Lymphocyte decrease 12/20/22  1 Not related N   Platelet count decrease 11/22/22 12/13/22 4 Not related N   Platelet count decrease 12/13/22 01/04/23 3     Platelet count decrease 01/04/23  1               Neutrophil count decrease 11/22/22 12/03/22 3 Not related N   Neutrophil count decrease 12/03/22 12/28/22 4 Not related N   Neutrophil count decrease 12/28/22 01/07/23 3 Not related N   Neutrophil count decrease 01/07/23 01/21/23 2 Not related N   Neutrophil count decrease 01/21/23 01/28/23 3 Not related N   Neutrophil count decrease 01/28/23  4 Not related N           All lab values and assessments were reviewed by the investigator and are considered not clinically significant unless otherwise noted.    Concomitant Medications   Medication Dose Start Date End Date Indication Related AE, if any   Calcium Carbonate 400mg  7/3/202  2 tablets for calcium    Cholecalciferol 25 mcg 09/20/20  1 tablet for vitamin D    Cholestyramine 4 grams 07/21/20  4grams in water for diarrhea non   B12 2,000 mcg 04/04/22  2,000 mcg by mouth everyday    Diltiazem (Cardizem) 120 Mg   1 tablet by mouth    Tiazec 120 mg   1 tablet by mouth    Ferrous sulfate 325 mg  11/22/22 1 tablet by mouth    Flonase 50 mcg   Intranasal 1x a day    Claritin 10 mg   1 tablet by mouth    Toprol 50 mg   1 tablet by mouth    omeprazole 40 mg   1 tablet by mouth    Revatio 20 mg   1-5 tablets as needed    Tamsulosin .4mg    1 tablet by mouth as needed    Trazodone 50 mg  11/22/22 1 tablet by mouth Not been using   Valtrex 500 mg   1 tablet by mouth    Study Medication        Azacitidine        Venetoclax

## 2023-02-02 DIAGNOSIS — C9200 Acute myeloblastic leukemia, not having achieved remission: Principal | ICD-10-CM

## 2023-02-02 MED ORDER — VENETOCLAX 100 MG TABLET
ORAL_TABLET | Freq: Every day | ORAL | 0 refills | 30 days | Status: CP
Start: 2023-02-02 — End: 2023-03-04

## 2023-02-06 NOTE — Unmapped (Signed)
[facility] Refill Coordination Note    Specialty Medication(s) to be Shipped:   Hematology/Oncology: Venclexta 100mg     Other medication(s) to be shipped: No additional medications requested for fill at this time     Timothy Porter, DOB: 05-Jan-1946  Phone: [phone] (home)       All above HIPAA information was verified with patient.     Was a Nurse, learning disability used for this call? No    Completed refill call assessment today to schedule patient's medication shipment from the [facility] [phone]).  All relevant notes have been reviewed.     Specialty medication(s) and dose(s) confirmed: Regimen is correct and unchanged.   Changes to medications: Chett reports no changes at this time.  Changes to insurance: No  New side effects reported not previously addressed with a pharmacist or physician: None reported  Questions for the pharmacist: No    Confirmed patient received a Conservation officer, historic buildings and a Surveyor, mining with first shipment. The patient will receive a drug information handout for each medication shipped and additional FDA Medication Guides as required.       DISEASE/MEDICATION-SPECIFIC INFORMATION        N/A    SPECIALTY MEDICATION ADHERENCE     Medication Adherence    Patient reported X missed doses in the last month: 7  Specialty Medication: Venclexta 100mg   Patient is on additional specialty medications: No  Informant: patient     Were doses missed due to medication being on hold? Yes - MD    Venclexta  100 mg: 16 days of medicine on hand       REFERRAL TO PHARMACIST     Referral to the pharmacist: Not needed      [facility]     Shipping address confirmed in Epic.     Patient was notified of new phone menu : Yes    Delivery Scheduled: Yes, Expected medication delivery date: 02/17/23.     Medication will be delivered via Next Day Courier to the prescription address in Epic [place].    Wyatt Mage M Elisabeth Cara   [facility] Specialty Technician

## 2023-02-08 ENCOUNTER — Ambulatory Visit: Admit: 2023-02-08 | Discharge: 2023-02-09 | Payer: MEDICARE

## 2023-02-08 ENCOUNTER — Other Ambulatory Visit: Admit: 2023-02-08 | Discharge: 2023-02-09 | Payer: MEDICARE

## 2023-02-08 ENCOUNTER — Encounter: Admit: 2023-02-08 | Discharge: 2023-02-09 | Payer: MEDICARE | Attending: Hematology | Primary: Hematology

## 2023-02-08 DIAGNOSIS — C9000 Multiple myeloma not having achieved remission: Principal | ICD-10-CM

## 2023-02-08 LAB — CBC W/ AUTO DIFF
BASOPHILS ABSOLUTE COUNT: 0 10*9/L (ref 0.0–0.1)
BASOPHILS RELATIVE PERCENT: 0.3 %
EOSINOPHILS ABSOLUTE COUNT: 0 10*9/L (ref 0.0–0.5)
EOSINOPHILS RELATIVE PERCENT: 0 %
HEMATOCRIT: 31.5 % — ABNORMAL LOW (ref 39.0–48.0)
HEMOGLOBIN: 11 g/dL — ABNORMAL LOW (ref 12.9–16.5)
LYMPHOCYTES ABSOLUTE COUNT: 1.7 10*9/L (ref 1.1–3.6)
LYMPHOCYTES RELATIVE PERCENT: 51.4 %
MEAN CORPUSCULAR HEMOGLOBIN CONC: 34.8 g/dL (ref 32.0–36.0)
MEAN CORPUSCULAR HEMOGLOBIN: 35.7 pg — ABNORMAL HIGH (ref 25.9–32.4)
MEAN CORPUSCULAR VOLUME: 102.4 fL — ABNORMAL HIGH (ref 77.6–95.7)
MEAN PLATELET VOLUME: 7.6 fL (ref 6.8–10.7)
MONOCYTES ABSOLUTE COUNT: 0.7 10*9/L (ref 0.3–0.8)
MONOCYTES RELATIVE PERCENT: 20.8 %
NEUTROPHILS ABSOLUTE COUNT: 0.9 10*9/L — ABNORMAL LOW (ref 1.8–7.8)
NEUTROPHILS RELATIVE PERCENT: 27.5 %
PLATELET COUNT: 151 10*9/L (ref 150–450)
RED BLOOD CELL COUNT: 3.08 10*12/L — ABNORMAL LOW (ref 4.26–5.60)
RED CELL DISTRIBUTION WIDTH: 24.3 % — ABNORMAL HIGH (ref 12.2–15.2)
WBC ADJUSTED: 3.3 10*9/L — ABNORMAL LOW (ref 3.6–11.2)

## 2023-02-08 LAB — MAGNESIUM: MAGNESIUM: 1.8 mg/dL (ref 1.6–2.6)

## 2023-02-08 LAB — COMPREHENSIVE METABOLIC PANEL
ALBUMIN: 4 g/dL (ref 3.4–5.0)
ALKALINE PHOSPHATASE: 97 U/L (ref 46–116)
ALT (SGPT): 46 U/L (ref 10–49)
ANION GAP: 7 mmol/L (ref 5–14)
AST (SGOT): 35 U/L — ABNORMAL HIGH (ref <=34)
BILIRUBIN TOTAL: 1.3 mg/dL — ABNORMAL HIGH (ref 0.3–1.2)
BLOOD UREA NITROGEN: 20 mg/dL (ref 9–23)
BUN / CREAT RATIO: 21
CALCIUM: 8.6 mg/dL — ABNORMAL LOW (ref 8.7–10.4)
CHLORIDE: 107 mmol/L (ref 98–107)
CO2: 27 mmol/L (ref 20.0–31.0)
CREATININE: 0.95 mg/dL
EGFR CKD-EPI (2021) MALE: 83 mL/min/{1.73_m2} (ref >=60)
GLUCOSE RANDOM: 105 mg/dL (ref 70–179)
POTASSIUM: 3.5 mmol/L (ref 3.5–5.1)
PROTEIN TOTAL: 6.5 g/dL (ref 5.7–8.2)
SODIUM: 141 mmol/L (ref 135–145)

## 2023-02-08 LAB — MONOCLONAL GAMMOPATHY CHEMISTRIES
GAMMAGLOBULIN; IGA: 29.2 mg/dL — ABNORMAL LOW (ref 70.0–400.0)
GAMMAGLOBULIN; IGG: 992 mg/dL (ref 650–1600)
PROTEIN TOTAL (SPECIAL CHEM): 6.5 g/dL (ref 5.7–8.2)

## 2023-02-08 LAB — LACTATE DEHYDROGENASE: LACTATE DEHYDROGENASE: 224 U/L (ref 120–246)

## 2023-02-08 LAB — SLIDE REVIEW

## 2023-02-08 LAB — URIC ACID: URIC ACID: 4.5 mg/dL

## 2023-02-08 MED ORDER — VENETOCLAX 100 MG TABLET
ORAL_TABLET | Freq: Every day | ORAL | 1 refills | 21 days | Status: CP
Start: 2023-02-08 — End: 2023-03-08
  Filled 2023-02-16: qty 84, 28d supply, fill #0

## 2023-02-08 MED ADMIN — ondansetron (ZOFRAN) tablet 8 mg: 8 mg | ORAL | @ 20:00:00 | Stop: 2023-02-08

## 2023-02-08 MED ADMIN — azaCITIDine (VIDAZA) syringe: 75 mg/m2 | SUBCUTANEOUS | @ 20:00:00 | Stop: 2023-02-08

## 2023-02-08 NOTE — Unmapped (Addendum)
We are going to be able to start your next cycle of treatment today.     You will be getting 7 days of the azacitidine injections.     You will also start taking Venetoclax today.  You should take 400mg  (4 tablets) daily x 21 days.  We are reducing the dose now because we had to delay you a week.  So if you start today, then your last day to take the venetoclax will be 4/3    You do NOT need to take the levaquin or voriconazole    You will be back for a lab check on 3/28 just to see how your counts are doing.      I will plan for you to then come back on 4/11 to see [name].     Lab on 02/08/2023   Component Date Value Ref Range Status    Sodium 02/08/2023 141  135 - 145 mmol/L Final    Potassium 02/08/2023 3.5  3.5 - 5.1 mmol/L Final    Chloride 02/08/2023 107  98 - 107 mmol/L Final    CO2 02/08/2023 27.0  20.0 - 31.0 mmol/L Final    Anion Gap 02/08/2023 7  5 - 14 mmol/L Final    BUN 02/08/2023 20  9 - 23 mg/dL Final    Creatinine 16/08/9603 0.95  0.73 - 1.18 mg/dL Final    BUN/Creatinine Ratio 02/08/2023 21   Final    eGFR CKD-EPI (2021) Male 02/08/2023 83  >=60 mL/min/1.43m2 Final    eGFR calculated with CKD-EPI 2021 equation in accordance with [employer] and [employer] of Nephrology Task Force recommendations.    Glucose 02/08/2023 105  70 - 179 mg/dL Final    Calcium 54/07/8118 8.6 (L)  8.7 - 10.4 mg/dL Final    Albumin 14/78/2956 4.0  3.4 - 5.0 g/dL Final    Total Protein 02/08/2023 6.5  5.7 - 8.2 g/dL Final    Total Bilirubin 02/08/2023 1.3 (H)  0.3 - 1.2 mg/dL Final    AST 21/30/8657 35 (H)  <=34 U/L Final    ALT 02/08/2023 46  10 - 49 U/L Final    Alkaline Phosphatase 02/08/2023 97  46 - 116 U/L Final    LDH 02/08/2023 224  120 - 246 U/L Final    Uric Acid 02/08/2023 4.5  3.7 - 9.2 mg/dL Final    Magnesium 84/69/6295 1.8  1.6 - 2.6 mg/dL Final    WBC 28/41/3244 3.3 (L)  3.6 - 11.2 10*9/L Final    RBC 02/08/2023 3.08 (L)  4.26 - 5.60 10*12/L Final    HGB 02/08/2023 11.0 (L)  12.9 - 16.5 g/dL Final    HCT 11/29/7251 31.5 (L)  39.0 - 48.0 % Final    MCV 02/08/2023 102.4 (H)  77.6 - 95.7 fL Final    MCH 02/08/2023 35.7 (H)  25.9 - 32.4 pg Final    MCHC 02/08/2023 34.8  32.0 - 36.0 g/dL Final    RDW 66/44/0347 24.3 (H)  12.2 - 15.2 % Final    MPV 02/08/2023 7.6  6.8 - 10.7 fL Final    Platelet 02/08/2023 151  150 - 450 10*9/L Final    Neutrophils % 02/08/2023 27.5  % Final    Lymphocytes % 02/08/2023 51.4  % Final    Monocytes % 02/08/2023 20.8  % Final    Eosinophils % 02/08/2023 0.0  % Final    Basophils % 02/08/2023 0.3  % Final    Absolute  Neutrophils 02/08/2023 0.9 (L)  1.8 - 7.8 10*9/L Final    Absolute Lymphocytes 02/08/2023 1.7  1.1 - 3.6 10*9/L Final    Absolute Monocytes 02/08/2023 0.7  0.3 - 0.8 10*9/L Final    Absolute Eosinophils 02/08/2023 0.0  0.0 - 0.5 10*9/L Final    Absolute Basophils 02/08/2023 0.0  0.0 - 0.1 10*9/L Final    Macrocytosis 02/08/2023 Slight (A)  Not Present Final    Anisocytosis 02/08/2023 Marked (A)  Not Present Final    Smear Review Comments 02/08/2023 See Comment (A)  Undefined Final    Slide Reviewed. Instrument ID [id number]  Myelocytes present.    Toxic Granulation 02/08/2023 Present (A)  Not Present Final

## 2023-02-08 NOTE — Unmapped (Signed)
RED ZONE Means: RED ZONE: Take action now!     You need to be seen right away  Symptoms are at a severe level of discomfort    Call 911 or go to your nearest  Hospital for help     - Bleeding that will not stop    - Hard to breathe    - New seizure - Chest pain  - Fall or passing out  -Thoughts of hurting    yourself or others      Call 911 if you are going into the RED ZONE                  YELLOW ZONE Means:     Please call with any new or worsening symptom(s), even if not on this list.  Call [phone]  After hours, weekends, and holidays - you will reach a long recording with specific instructions, If not in an emergency such as above, please listen closely all the way to the end and choose the option that relates to your need.   You can be seen by a provider the same day through our Same Day Acute Care for Patients with Cancer program.      YELLOW ZONE: Take action today     Symptoms are new or worsening  You are not within your goal range for:    - Pain    - Shortness of breath    - Bleeding (nose, urine, stool, wound)    - Feeling sick to your stomach and throwing up    - Mouth sores/pain in your mouth or throat    - Hard stool or very loose stools (increase in       ostomy output)    - No urine for 12 hours    - Feeding tube or other catheter/tube issue    - Redness or pain at previous IV or port/catheter site    - Depressed or anxiety   - Swelling (leg, arm, abdomen,     face, neck)  - Skin rash or skin changes  - Wound issues (redness, drainage,    re-opened)  - Confusion  - Vision changes  - Fever >100.4 F or chills  - Worsening cough with mucus that is    green, yellow, or bloody  - Pain or burning when going to the    bathroom  - Home Infusion Pump Issue- call    [phone]         Call your healthcare provider if you are going into the YELLOW ZONE     GREEN ZONE Means:  Your symptoms are under controls  Continue to take your medicine as ordered  Keep all visits to the provider GREEN ZONE: You are in control  No increase or worsening symptoms  Able to take your medicine  Able to drink and eat    - DO NOT use MyChart messages to report red or yellow symptoms. Allow up to 3    business days for a reply.  -MyChart is for non-urgent medication refills, scheduling requests, or other general questions.         [id number] Rev. 05/26/2022  Approved by Oncology Patient Education Committee        Lab on 02/08/2023   Component Date Value Ref Range Status    Sodium 02/08/2023 141  135 - 145 mmol/L Final    Potassium 02/08/2023 3.5  3.5 - 5.1 mmol/L Final    Chloride 02/08/2023 107  98 -  107 mmol/L Final    CO2 02/08/2023 27.0  20.0 - 31.0 mmol/L Final    Anion Gap 02/08/2023 7  5 - 14 mmol/L Final    BUN 02/08/2023 20  9 - 23 mg/dL Final    Creatinine 16/08/9603 0.95  0.73 - 1.18 mg/dL Final    BUN/Creatinine Ratio 02/08/2023 21   Final    eGFR CKD-EPI (2021) Male 02/08/2023 83  >=60 mL/min/1.28m2 Final    eGFR calculated with CKD-EPI 2021 equation in accordance with [employer] and [employer] of Nephrology Task Force recommendations.    Glucose 02/08/2023 105  70 - 179 mg/dL Final    Calcium 54/07/8118 8.6 (L)  8.7 - 10.4 mg/dL Final    Albumin 14/78/2956 4.0  3.4 - 5.0 g/dL Final    Total Protein 02/08/2023 6.5  5.7 - 8.2 g/dL Final    Total Bilirubin 02/08/2023 1.3 (H)  0.3 - 1.2 mg/dL Final    AST 21/30/8657 35 (H)  <=34 U/L Final    ALT 02/08/2023 46  10 - 49 U/L Final    Alkaline Phosphatase 02/08/2023 97  46 - 116 U/L Final    LDH 02/08/2023 224  120 - 246 U/L Final    Uric Acid 02/08/2023 4.5  3.7 - 9.2 mg/dL Final    Magnesium 84/69/6295 1.8  1.6 - 2.6 mg/dL Final    ABO Grouping 28/41/3244 A POS   Final    Antibody Screen 02/08/2023 NEG   Final    WBC 02/08/2023 3.3 (L)  3.6 - 11.2 10*9/L Final    RBC 02/08/2023 3.08 (L)  4.26 - 5.60 10*12/L Final    HGB 02/08/2023 11.0 (L)  12.9 - 16.5 g/dL Final    HCT 11/29/7251 31.5 (L)  39.0 - 48.0 % Final    MCV 02/08/2023 102.4 (H)  77.6 - 95.7 fL Final    MCH 02/08/2023 35.7 (H)  25.9 - 32.4 pg Final    MCHC 02/08/2023 34.8  32.0 - 36.0 g/dL Final    RDW 66/44/0347 24.3 (H)  12.2 - 15.2 % Final    MPV 02/08/2023 7.6  6.8 - 10.7 fL Final    Platelet 02/08/2023 151  150 - 450 10*9/L Final    Neutrophils % 02/08/2023 27.5  % Final    Lymphocytes % 02/08/2023 51.4  % Final    Monocytes % 02/08/2023 20.8  % Final    Eosinophils % 02/08/2023 0.0  % Final    Basophils % 02/08/2023 0.3  % Final    Absolute Neutrophils 02/08/2023 0.9 (L)  1.8 - 7.8 10*9/L Final    Absolute Lymphocytes 02/08/2023 1.7  1.1 - 3.6 10*9/L Final    Absolute Monocytes 02/08/2023 0.7  0.3 - 0.8 10*9/L Final    Absolute Eosinophils 02/08/2023 0.0  0.0 - 0.5 10*9/L Final    Absolute Basophils 02/08/2023 0.0  0.0 - 0.1 10*9/L Final    Macrocytosis 02/08/2023 Slight (A)  Not Present Final    Anisocytosis 02/08/2023 Marked (A)  Not Present Final    Total IgG 02/08/2023 992  650 - 1,600 mg/dL Preliminary    IgA 42/59/5638 29.2 (L)  70.0 - 400.0 mg/dL Preliminary    Total Protein 02/08/2023 6.5  5.7 - 8.2 g/dL Preliminary    Smear Review Comments 02/08/2023 See Comment (A)  Undefined Final    Slide Reviewed. Instrument ID [id number]  Myelocytes present.    Toxic Granulation 02/08/2023 Present (A)  Not Present Final

## 2023-02-08 NOTE — Unmapped (Signed)
[facility] Leukemia Clinic Visit Note     Patient Name: Timothy Porter  Patient Age: 77 y.o.  Encounter Date: 02/08/2023    Primary Care Provider:  Pcp, None Per Patient    Referring Physician:  Pcp, None Per Patient  [address] [place],  [place] [address]    Reason for visit:  AML     Assessment:  Timothy Porter is a 77 y.o. 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  He enrolled on BAML S12 arm (ven x 28 days) and began treatment on 11/22/2022. After 1 cycle of treatment he was found to be in CR without MRD.  He has completed 2 cycles of treatment and presents to clinic today for follow up.     Timothy Porter has tolerated treatment on aza/ven well without toxicities or complications, outside of delayed count recovery.  His treatment was delayed last week to allow for count recovery.  Today ANC is 0.9 and plt 151.  We will plan to proceed with cycle 3 today with azacitidine 75mg /m2 x 7 days + venetoclax 400mg  x 21 days.  He can stop levaquin at this time.  Continue valtrex.  He remains transfusion independent, but will plan on a mid-cycle lab check.  Will plan for him to RTC in 4 week for consideration for cycle 4.     Free Kappa Chain MM:  He will continue follow up with MM team and has upcoming appt with [name] on 02/13/2023         Diarrhea: Chronic and long standing.  Continue with cholestyramine, but recommend adding imodium to bowel regimen                                                  Plan:   - proceed with cycle 2 on S12 with azacitidine 75mg /m2 x 7 days + venetoclax 400mg  x 28 days.   - stop levaquin  - continue valtrex  - mid-cycle lab check  - RTC in 4 week for follow up and consideration of cycle 4      [name] was available    [name], AGNP-BC  Leukemia Research Nurse Practitioner  Hematology/Oncology Division  [facility]  02/08/2023    I personally spent 40 minutes face-to-face and non-face-to-face in the care of this patient, which includes all pre, intra, and post visit time on the date of service.  All documented time was specific to the E/M visit and does not include any procedures that may have been performed.    Hematology/Oncology History Overview Note   Referring/Local Oncologist:  [name]     Diagnosis:   Diagnosis   Date Value Ref Range Status   10/26/2022   Final    Bone marrow, left iliac, aspiration and biopsy  -  Hypercellular bone marrow (50%) involved by acute myeloid leukemia with MECOM rearrangement (23%  blasts by manual aspirate differential and 10-20% CD34-positive blasts by immunohistochemistry)   -  Mild to focally moderate marrow fibrosis (MF-1 to 2)   -  Less than 5% plasma cells by manual aspirate differential count (see Comment)  -  See linked reports for associated Ancillary Studies.      This electronic signature is attestation that the pathologist personally reviewed the submitted material(s) and the final diagnosis reflects that evaluation.          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,XY[7]         Myeloid Mutational Panel:        Variants of Known/Likely Clinical Significance:   Gene Coding Predicted Protein Variant allele fraction   SRSF2 c.284C>G p.(Pro95Arg) 21.6 %       Treatment Timeline:  11/22/2022 Cycle 1:  BAML S12 azacitidine 75mg /m2 x 7 days + venetoclax 400mg  x 28 days  12/13/22: Bmbx - Limited, though no increase in blasts, MRD-neg by flow, poor specimen but no blasts (at <0.2%)   01/04/2023 Cycle 2: aza 75 x 7 + ven 400 x 28 days (2 week delay)  01/23/2023: BMbx: <5% blasts by CD34 immunohistochemistry, flow MRD negative  02/09/2023: Cycle 3: Aza 75 x 7 + ven 400 x 21       Multiple myeloma (CMS-HCC)   02/12/2019 Initial Diagnosis    Multiple myeloma (CMS-HCC)     05/28/2019 - 06/03/2019 Chemotherapy    BMT IP AUTO MELPHALAN  Melphalan 140 mg/m2 or 200 mg/m2 IV Day -1     10/22/2019 - 06/27/2022 Chemotherapy    STUDY [id number] IRB# [id number] LENALIDOMIDE (v. 11/04/18)  A Randomized Study of Daratumumab Plus Lenalidomide Versus Lenalidomide Alone as Maintenance Treatment in Patients with Newly Diagnosed Multiple Myeloma Who Are Minimal Residual Disease Positive After Frontline Autologous Stem Cell Transplant.     History of auto stem cell transplant (CMS-HCC) (Resolved)   08/05/2019 Initial Diagnosis    History of auto stem cell transplant (CMS-HCC)     10/22/2019 - 06/27/2022 Chemotherapy    STUDY [id number] IRB# [id number] LENALIDOMIDE (v. 11/04/18)  A Randomized Study of Daratumumab Plus Lenalidomide Versus Lenalidomide Alone as Maintenance Treatment in Patients with Newly Diagnosed Multiple Myeloma Who Are Minimal Residual Disease Positive After Frontline Autologous Stem Cell Transplant.     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 He reports how much better he is feeling now since starting treatment.  He continues to have frequent loose stools with urgency but has started taking imodium.  Denies any n/v.  No sob/coughing, dizziness or light-headedness.  No new fevers/chills.     Otherwise, he denies new constitutional symptoms such as anorexia, weight loss, fatigue, night sweats or unexplained fevers.  Furthermore, he denies unexplained bleeding or bruising, recurrent or unexplained intercurrent infections, dyspnea on exertion, lightheadedness, palpitations or chest pain.  There have been no new or unexplained pains or self-identified masses, swelling or enlarged lymph nodes.    ROS reviewed and negative except as noted in H and P     ECOG: 1    Allergies:  No Known Allergies    Medications:   Current Outpatient Medications:  calcium carbonate (TUMS) 200 mg calcium (500 mg) chewable tablet, Chew 2 tablets (400 mg of elem calcium total) Three (3) times a day., Disp: , Rfl:     cholecalciferol, vitamin D3 25 mcg, 1,000 units,, 1,000 unit (25 mcg) tablet, Take 1 tablet (25 mcg total) by mouth., Disp: , Rfl:     cyanocobalamin, vitamin B-12, 2,000 mcg Tab, Take 2,000 mcg by mouth in the morning., Disp: 90 tablet, Rfl: 3    docusate sodium (COLACE) 50 MG capsule, Take 1 capsule (50 mg total) by mouth two (2) times a day., Disp: , Rfl:     loratadine (CLARITIN) 10 mg tablet, Take 1 tablet (10 mg total) by mouth daily., Disp: , Rfl:     metoPROLOL succinate (TOPROL-XL) 50 MG 24 hr tablet, Take 1 tablet (50 mg total) by mouth daily., Disp: 30 tablet, Rfl: 11    multivitamin with minerals tablet, Take 1 tablet by mouth daily., Disp: , Rfl:     omeprazole (PRILOSEC) 40 MG capsule, Take 1 capsule (40 mg total) by mouth daily., Disp: 90 capsule, Rfl: 0    ondansetron (ZOFRAN) 8 MG tablet, Take 1 tablet (8 mg total) by mouth every eight (8) hours as needed for nausea., Disp: 30 tablet, Rfl: 2    sildenafiL, pulm.hypertension, (REVATIO) 20 mg tablet, Take 1-5 tablets (20-100 mg total) by mouth daily as needed (for erectile function)., Disp: 90 tablet, Rfl: 3    tamsulosin (FLOMAX) 0.4 mg capsule, Take 1 capsule (0.4 mg total) by mouth daily., Disp: 90 capsule, Rfl: 0    valACYclovir (VALTREX) 500 MG tablet, Take 1 tablet (500 mg total) by mouth daily., Disp: 90 tablet, Rfl: 3    venetoclax (VENCLEXTA) 100 mg tablet, Take 4 tablets (400 mg total) by mouth daily for 21 days., Disp: 84 tablet, Rfl: 1  No current facility-administered medications for this visit.    Facility-Administered Medications Ordered in Other Visits:     DTaP-hepatitis B recombinant-IPV (PEDIARIX) 10 mcg-25Lf-25 mcg-10Lf/0.5 mL injection, , , ,     DTaP-hepatitis B recombinant-IPV (PEDIARIX) 10 mcg-25Lf-25 mcg-10Lf/0.5 mL injection, , , ,     haemophilus B polysac-tetanus toxoid (ActHIB) 10 mcg/0.5 mL injection, , , ,     influenza vaccine quad (FLUARIX, FLULAVAL, FLUZONE) (6 MOS & UP) 2020-21 ADS Med, , , ,     pneumococcal conjugate (13-valent) (PREVNAR-13) 0.5 mL vaccine, , , ,     pneumococcal conjugate (13-valent) (PREVNAR-13) 0.5 mL vaccine, , , ,     varicella-zoster gE-AS01B (PF) (SHINGRIX) 50 mcg/0.5 mL injection, , , ,     Medical History:  Past Medical History:   Diagnosis Date    Benign prostatic hyperplasia with lower urinary tract symptoms 11/27/2018    BPH (benign prostatic hyperplasia)     Diarrhea     Fatigue     Fractures     left arm    Glucose intolerance     May 2019: HbA1c 6.2.    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  Planning to build more apartments.       Family History:  Family History   Problem Relation Age of Onset    Dementia Father     No Known Problems Other        Objective:   Vitals:    02/08/23 1354   BP: 154/82   Pulse: 80   Resp: 18   Temp: 36.4 ??C (97.6 ??F)   SpO2: 99%       Physical Exam:  General: Resting, in no apparent distress  HEENT:  PERRL. No scleral icterus or conjunctival injection.   Heart:  RRR.  S1, S2.  No murmurs, gallops or rubs. Mild non-pitting edema noted BLE.   Lungs:  Breathing is unlabored, and patient is speaking full sentences with ease.  CTAB. No rales, ronchi or crackles.    Abdomen:  No distention or pain on palpation.  Bowel sounds are present.  No palpable masses.  Skin:  No rashes, petechiae or purpura. Grossly intact.  Musculoskeletal:  strength is equal bilaterally   Psychiatric:  Range of affect is appropriate.    Neurologic:  Alert and oriented x 4.  Steady gait with assistance of cane.    Test Results:  Lab on 02/08/2023   Component Date Value Ref Range Status    Sodium 02/08/2023 141  135 - 145 mmol/L Final    Potassium 02/08/2023 3.5  3.5 - 5.1 mmol/L Final    Chloride 02/08/2023 107  98 - 107 mmol/L Final    CO2 02/08/2023 27.0  20.0 - 31.0 mmol/L Final    Anion Gap 02/08/2023 7  5 - 14 mmol/L Final    BUN 02/08/2023 20  9 - 23 mg/dL Final    Creatinine 09/81/1914 0.95  0.73 - 1.18 mg/dL Final    BUN/Creatinine Ratio 02/08/2023 21   Final    eGFR CKD-EPI (2021) Male 02/08/2023 83  >=60 mL/min/1.95m2 Final    eGFR calculated with CKD-EPI 2021 equation in accordance with [employer] and [employer] of Nephrology Task Force recommendations.    Glucose 02/08/2023 105  70 - 179 mg/dL Final    Calcium 78/29/5621 8.6 (L)  8.7 - 10.4 mg/dL Final    Albumin 30/86/5784 4.0  3.4 - 5.0 g/dL Final    Total Protein 02/08/2023 6.5  5.7 - 8.2 g/dL Final    Total Bilirubin 02/08/2023 1.3 (H)  0.3 - 1.2 mg/dL Final    AST 69/62/9528 35 (H)  <=34 U/L Final    ALT 02/08/2023 46  10 - 49 U/L Final    Alkaline Phosphatase 02/08/2023 97  46 - 116 U/L Final    LDH 02/08/2023 224  120 - 246 U/L Final    Uric Acid 02/08/2023 4.5  3.7 - 9.2 mg/dL Final    Magnesium 41/32/4401 1.8  1.6 - 2.6 mg/dL Final    ABO Grouping 02/72/5366 A POS   Final    Antibody Screen 02/08/2023 NEG   Final    WBC 02/08/2023 3.3 (L)  3.6 - 11.2 10*9/L Final    RBC 02/08/2023 3.08 (L)  4.26 - 5.60 10*12/L Final    HGB 02/08/2023 11.0 (L)  12.9 - 16.5 g/dL Final    HCT 44/01/4741 31.5 (L)  39.0 - 48.0 % Final    MCV 02/08/2023 102.4 (H)  77.6 - 95.7 fL Final    MCH 02/08/2023 35.7 (H)  25.9 - 32.4 pg Final    MCHC 02/08/2023 34.8  32.0 - 36.0 g/dL Final    RDW 59/56/3875 24.3 (H)  12.2 - 15.2 % Final  MPV 02/08/2023 7.6  6.8 - 10.7 fL Final    Platelet 02/08/2023 151  150 - 450 10*9/L Final    Neutrophils % 02/08/2023 27.5  % Final    Lymphocytes % 02/08/2023 51.4  % Final    Monocytes % 02/08/2023 20.8  % Final    Eosinophils % 02/08/2023 0.0  % Final    Basophils % 02/08/2023 0.3  % Final    Absolute Neutrophils 02/08/2023 0.9 (L)  1.8 - 7.8 10*9/L Final    Absolute Lymphocytes 02/08/2023 1.7  1.1 - 3.6 10*9/L Final    Absolute Monocytes 02/08/2023 0.7  0.3 - 0.8 10*9/L Final    Absolute Eosinophils 02/08/2023 0.0  0.0 - 0.5 10*9/L Final    Absolute Basophils 02/08/2023 0.0  0.0 - 0.1 10*9/L Final    Macrocytosis 02/08/2023 Slight (A)  Not Present Final    Anisocytosis 02/08/2023 Marked (A)  Not Present Final    T Albumin 02/08/2023 4.2  3.5 - 5.0 g/dL Final    Alpha-1 Globulin 02/08/2023 0.3  0.2 - 0.5 g/dL Final    Alpha-2 Globulin 02/08/2023 0.7  0.5 - 1.1 g/dL Final    Beta-1 Globulin 02/08/2023 0.4  0.3 - 0.6 g/dL Final    Beta-2 Globulin 02/08/2023 0.1 (L)  0.2 - 0.6 g/dL Final    Gammaglobulin 02/08/2023 0.8  0.5 - 1.5 g/dL Final    SPE Interpretation 02/08/2023    Final    The SPE pattern demonstrates a slight irregularity in the gamma region, which may represent monoclonal protein. See Immunofixation report.      Immunofixation Electrophoresis, Se* 02/08/2023    Final    No monoclonal protein detected.      Total Protein 02/08/2023 6.5  g/dL Final    Total IgG 16/08/9603 992  650 - 1,600 mg/dL Final    IgM 54/07/8118 9 (L)  40 - 230 mg/dL Final    IgA 14/78/2956 29.2 (L)  70.0 - 400.0 mg/dL Final    Total Protein 02/08/2023 6.5  5.7 - 8.2 g/dL Final    Kappa Free, Serum 02/08/2023 1.07  0.33 - 1.94 mg/dL Final    Lambda Free, Serum 02/08/2023 0.64  0.57 - 2.63 mg/dL Final    K/L FLC Ratio 02/08/2023 1.67 (H)  0.26 - 1.65 Final    Smear Review Comments 02/08/2023 See Comment (A)  Undefined Final    Slide Reviewed. Instrument ID [id number]  Myelocytes present.    Toxic Granulation 02/08/2023 Present (A)  Not Present Final

## 2023-02-08 NOTE — Unmapped (Signed)
[facility] Pharmacist has reviewed a new prescription for Venetoclax that indicates a dose  duration change.  Now taking 400 mg once daily x 21 days of each cycle .  Patient was counseled on this dosage change by Oliver Hum- see epic note from 02/08/23.  Next refill call date adjusted if necessary.    Horace Porteous, PharmD  [facility]

## 2023-02-08 NOTE — Unmapped (Signed)
Clinical Assessment Needed For: Dose Change  Medication: Venclexta  Last Fill Date/Day Supply: 2-21 / 30  Refill Too Soon until 3-15  Was previous dose already scheduled to fill: No    Notes to Pharmacist:

## 2023-02-08 NOTE — Unmapped (Signed)
Pt tolerated chemo injections without difficulty.  Pt was stable at discharge via self ambulation with steady gait and with cane.

## 2023-02-09 ENCOUNTER — Ambulatory Visit: Admit: 2023-02-09 | Discharge: 2023-02-10 | Payer: MEDICARE

## 2023-02-09 LAB — SERUM FREE LIGHT CHAINS
K/L FLC RATIO: 1.67 — ABNORMAL HIGH (ref 0.26–1.65)
KAPPA FREE,SERUM: 1.07 mg/dL (ref 0.33–1.94)
LAMBDA FREE, SER: 0.64 mg/dL (ref 0.57–2.63)

## 2023-02-09 LAB — MONOCLONAL GAMMOPATHY WORKUP, SERUM
ALBUMIN (SPE): 4.2 g/dL (ref 3.5–5.0)
ALPHA-1 GLOBULIN: 0.3 g/dL (ref 0.2–0.5)
ALPHA-2 GLOBULIN: 0.7 g/dL (ref 0.5–1.1)
BETA-1 GLOBULIN: 0.4 g/dL (ref 0.3–0.6)
BETA-2 GLOBULIN: 0.1 g/dL — ABNORMAL LOW (ref 0.2–0.6)
GAMMAGLOBULIN: 0.8 g/dL (ref 0.5–1.5)
PROTEIN TOTAL (SPECIAL CHEM): 6.5 g/dL

## 2023-02-09 LAB — MONOCLONAL GAMMOPATHY CHEMISTRIES
GAMMAGLOBULIN; IGA: 29.2 mg/dL — ABNORMAL LOW (ref 70.0–400.0)
GAMMAGLOBULIN; IGG: 992 mg/dL (ref 650–1600)
GAMMAGLOBULIN; IGM: 9 mg/dL — ABNORMAL LOW (ref 40–230)
PROTEIN TOTAL (SPECIAL CHEM): 6.5 g/dL (ref 5.7–8.2)

## 2023-02-09 MED ADMIN — ondansetron (ZOFRAN) tablet 8 mg: 8 mg | ORAL | @ 19:00:00 | Stop: 2023-02-09

## 2023-02-09 MED ADMIN — azaCITIDine (VIDAZA) syringe: 75 mg/m2 | SUBCUTANEOUS | @ 19:00:00 | Stop: 2023-02-09

## 2023-02-09 NOTE — Unmapped (Signed)
Tx tolerated well.

## 2023-02-10 ENCOUNTER — Ambulatory Visit: Admit: 2023-02-10 | Discharge: 2023-02-11 | Payer: MEDICARE

## 2023-02-10 MED ADMIN — azaCITIDine (VIDAZA) syringe: 75 mg/m2 | SUBCUTANEOUS | @ 12:00:00 | Stop: 2023-02-10

## 2023-02-10 MED ADMIN — ondansetron (ZOFRAN) tablet 8 mg: 8 mg | ORAL | @ 12:00:00 | Stop: 2023-02-10

## 2023-02-10 NOTE — Unmapped (Signed)
Patient arrived to chair 12.  No complaints noted.  Patient completed and tolerated injections.  AVS declined and patient discharged to home.

## 2023-02-10 NOTE — Unmapped (Signed)
No visits with results within 1 Day(s) from this visit.   Latest known visit with results is:   Lab on 02/08/2023   Component Date Value Ref Range Status    Sodium 02/08/2023 141  135 - 145 mmol/L Final    Potassium 02/08/2023 3.5  3.5 - 5.1 mmol/L Final    Chloride 02/08/2023 107  98 - 107 mmol/L Final    CO2 02/08/2023 27.0  20.0 - 31.0 mmol/L Final    Anion Gap 02/08/2023 7  5 - 14 mmol/L Final    BUN 02/08/2023 20  9 - 23 mg/dL Final    Creatinine 16/08/9603 0.95  0.73 - 1.18 mg/dL Final    BUN/Creatinine Ratio 02/08/2023 21   Final    eGFR CKD-EPI (2021) Male 02/08/2023 83  >=60 mL/min/1.67m2 Final    eGFR calculated with CKD-EPI 2021 equation in accordance with [employer] and [employer] of [employer]ph[employer] Task Force recommendations.    Glucose 02/08/2023 105  70 - 179 mg/dL Final    Calcium 54/07/8118 8.6 (L)  8.7 - 10.4 mg/dL Final    Albumin 14/78/2956 4.0  3.4 - 5.0 g/dL Final    Total Protein 02/08/2023 6.5  5.7 - 8.2 g/dL Final    Total Bilirubin 02/08/2023 1.3 (H)  0.3 - 1.2 mg/dL Final    AST 21/30/8657 35 (H)  <=34 U/L Final    ALT 02/08/2023 46  10 - 49 U/L Final    Alkaline Phosphatase 02/08/2023 97  46 - 116 U/L Final    LDH 02/08/2023 224  120 - 246 U/L Final    Uric Acid 02/08/2023 4.5  3.7 - 9.2 mg/dL Final    Magnesium 84/69/6295 1.8  1.6 - 2.6 mg/dL Final    ABO Grouping 28/41/3244 A POS   Final    Antibody Screen 02/08/2023 NEG   Final    WBC 02/08/2023 3.3 (L)  3.6 - 11.2 10*9/L Final    RBC 02/08/2023 3.08 (L)  4.26 - 5.60 10*12/L Final    HGB 02/08/2023 11.0 (L)  12.9 - 16.5 g/dL Final    HCT 11/29/7251 31.5 (L)  39.0 - 48.0 % Final    MCV 02/08/2023 102.4 (H)  77.6 - 95.7 fL Final    MCH 02/08/2023 35.7 (H)  25.9 - 32.4 pg Final    MCHC 02/08/2023 34.8  32.0 - 36.0 g/dL Final    RDW 66/44/0347 24.3 (H)  12.2 - 15.2 % Final    MPV 02/08/2023 7.6  6.8 - 10.7 fL Final    Platelet 02/08/2023 151  150 - 450 10*9/L Final    Neutrophils % 02/08/2023 27.5  % Final Lymphocytes % 02/08/2023 51.4  % Final    Monocytes % 02/08/2023 20.8  % Final    Eosinophils % 02/08/2023 0.0  % Final    Basophils % 02/08/2023 0.3  % Final    Absolute Neutrophils 02/08/2023 0.9 (L)  1.8 - 7.8 10*9/L Final    Absolute Lymphocytes 02/08/2023 1.7  1.1 - 3.6 10*9/L Final    Absolute Monocytes 02/08/2023 0.7  0.3 - 0.8 10*9/L Final    Absolute Eosinophils 02/08/2023 0.0  0.0 - 0.5 10*9/L Final    Absolute Basophils 02/08/2023 0.0  0.0 - 0.1 10*9/L Final    Macrocytosis 02/08/2023 Slight (A)  Not Present Final    Anisocytosis 02/08/2023 Marked (A)  Not Present Final    T Albumin 02/08/2023 4.2  3.5 - 5.0 g/dL Final    Alpha-1 Globulin  02/08/2023 0.3  0.2 - 0.5 g/dL Final    Alpha-2 Globulin 02/08/2023 0.7  0.5 - 1.1 g/dL Final    Beta-1 Globulin 02/08/2023 0.4  0.3 - 0.6 g/dL Final    Beta-2 Globulin 02/08/2023 0.1 (L)  0.2 - 0.6 g/dL Final    Gammaglobulin 02/08/2023 0.8  0.5 - 1.5 g/dL Final    SPE Interpretation 02/08/2023    Final    The SPE pattern demonstrates a slight irregularity in the gamma region, which may represent monoclonal protein. See Immunofixation report.      Immunofixation Electrophoresis, Se* 02/08/2023    Final    No monoclonal protein detected.      Total Protein 02/08/2023 6.5  g/dL Final    Total IgG 16/08/9603 992  650 - 1,600 mg/dL Final    IgM 54/07/8118 9 (L)  40 - 230 mg/dL Final    IgA 14/78/2956 29.2 (L)  70.0 - 400.0 mg/dL Final    Total Protein 02/08/2023 6.5  5.7 - 8.2 g/dL Final    Kappa Free, Serum 02/08/2023 1.07  0.33 - 1.94 mg/dL Final    Lambda Free, Serum 02/08/2023 0.64  0.57 - 2.63 mg/dL Final    K/L FLC Ratio 02/08/2023 1.67 (H)  0.26 - 1.65 Final    Smear Review Comments 02/08/2023 See Comment (A)  Undefined Final    Slide Reviewed. Instrument ID [id number]  Myelocytes present.    Toxic Granulation 02/08/2023 Present (A)  Not Present Final

## 2023-02-11 ENCOUNTER — Ambulatory Visit: Admit: 2023-02-11 | Discharge: 2023-02-12 | Payer: MEDICARE

## 2023-02-11 ENCOUNTER — Encounter
Admit: 2023-02-11 | Discharge: 2023-02-12 | Payer: MEDICARE | Attending: Nurse Practitioner | Primary: Nurse Practitioner

## 2023-02-11 LAB — CBC W/ AUTO DIFF
BASOPHILS ABSOLUTE COUNT: 0 10*9/L (ref 0.0–0.1)
BASOPHILS RELATIVE PERCENT: 0.2 %
EOSINOPHILS ABSOLUTE COUNT: 0 10*9/L (ref 0.0–0.5)
EOSINOPHILS RELATIVE PERCENT: 0 %
HEMATOCRIT: 29.9 % — ABNORMAL LOW (ref 39.0–48.0)
HEMOGLOBIN: 10.4 g/dL — ABNORMAL LOW (ref 12.9–16.5)
LYMPHOCYTES ABSOLUTE COUNT: 1 10*9/L — ABNORMAL LOW (ref 1.1–3.6)
LYMPHOCYTES RELATIVE PERCENT: 33.3 %
MEAN CORPUSCULAR HEMOGLOBIN CONC: 34.7 g/dL (ref 32.0–36.0)
MEAN CORPUSCULAR HEMOGLOBIN: 35.7 pg — ABNORMAL HIGH (ref 25.9–32.4)
MEAN CORPUSCULAR VOLUME: 103 fL — ABNORMAL HIGH (ref 77.6–95.7)
MEAN PLATELET VOLUME: 6.4 fL — ABNORMAL LOW (ref 6.8–10.7)
MONOCYTES ABSOLUTE COUNT: 0.6 10*9/L (ref 0.3–0.8)
MONOCYTES RELATIVE PERCENT: 21.1 %
NEUTROPHILS ABSOLUTE COUNT: 1.3 10*9/L — ABNORMAL LOW (ref 1.8–7.8)
NEUTROPHILS RELATIVE PERCENT: 45.4 %
PLATELET COUNT: 107 10*9/L — ABNORMAL LOW (ref 150–450)
RED BLOOD CELL COUNT: 2.91 10*12/L — ABNORMAL LOW (ref 4.26–5.60)
RED CELL DISTRIBUTION WIDTH: 22.9 % — ABNORMAL HIGH (ref 12.2–15.2)
WBC ADJUSTED: 2.9 10*9/L — ABNORMAL LOW (ref 3.6–11.2)

## 2023-02-11 LAB — COMPREHENSIVE METABOLIC PANEL
ALBUMIN: 3.8 g/dL (ref 3.4–5.0)
ALKALINE PHOSPHATASE: 89 U/L (ref 46–116)
ALT (SGPT): 42 U/L (ref 10–49)
ANION GAP: 4 mmol/L — ABNORMAL LOW (ref 5–14)
AST (SGOT): 30 U/L (ref <=34)
BILIRUBIN TOTAL: 1 mg/dL (ref 0.3–1.2)
BLOOD UREA NITROGEN: 18 mg/dL (ref 9–23)
BUN / CREAT RATIO: 19
CALCIUM: 8.6 mg/dL — ABNORMAL LOW (ref 8.7–10.4)
CHLORIDE: 107 mmol/L (ref 98–107)
CO2: 28 mmol/L (ref 20.0–31.0)
CREATININE: 0.93 mg/dL
EGFR CKD-EPI (2021) MALE: 86 mL/min/{1.73_m2} (ref >=60)
GLUCOSE RANDOM: 128 mg/dL (ref 70–179)
POTASSIUM: 3.6 mmol/L (ref 3.4–4.8)
PROTEIN TOTAL: 6.4 g/dL (ref 5.7–8.2)
SODIUM: 139 mmol/L (ref 135–145)

## 2023-02-11 MED ADMIN — ondansetron (ZOFRAN) tablet 8 mg: 8 mg | ORAL | @ 13:00:00 | Stop: 2023-02-11

## 2023-02-11 MED ADMIN — azaCITIDine (VIDAZA) syringe: 75 mg/m2 | SUBCUTANEOUS | @ 13:00:00 | Stop: 2023-02-11

## 2023-02-11 NOTE — Unmapped (Addendum)
Hospital Outpatient Visit on 02/11/2023   Component Date Value Ref Range Status    Sodium 02/11/2023 139  135 - 145 mmol/L Final    Potassium 02/11/2023 3.6  3.4 - 4.8 mmol/L Final    Chloride 02/11/2023 107  98 - 107 mmol/L Final    CO2 02/11/2023 28.0  20.0 - 31.0 mmol/L Final    Anion Gap 02/11/2023 4 (L)  5 - 14 mmol/L Final    BUN 02/11/2023 18  9 - 23 mg/dL Final    Creatinine 47/82/9562 0.93  0.73 - 1.18 mg/dL Final    BUN/Creatinine Ratio 02/11/2023 19   Final    eGFR CKD-EPI (2021) Male 02/11/2023 86  >=60 mL/min/1.30m2 Final    eGFR calculated with CKD-EPI 2021 equation in accordance with [employer] and [employer]ph[employer] Task Force recommendations.    Glucose 02/11/2023 128  70 - 179 mg/dL Final    Calcium 13/06/6577 8.6 (L)  8.7 - 10.4 mg/dL Final    Albumin 46/96/2952 3.8  3.4 - 5.0 g/dL Final    Total Protein 02/11/2023 6.4  5.7 - 8.2 g/dL Final    Total Bilirubin 02/11/2023 1.0  0.3 - 1.2 mg/dL Final    AST 84/13/2440 30  <=34 U/L Final    ALT 02/11/2023 42  10 - 49 U/L Final    Alkaline Phosphatase 02/11/2023 89  46 - 116 U/L Final    WBC 02/11/2023 2.9 (L)  3.6 - 11.2 10*9/L Final    RBC 02/11/2023 2.91 (L)  4.26 - 5.60 10*12/L Final    HGB 02/11/2023 10.4 (L)  12.9 - 16.5 g/dL Final    HCT 09/23/2535 29.9 (L)  39.0 - 48.0 % Final    MCV 02/11/2023 103.0 (H)  77.6 - 95.7 fL Final    MCH 02/11/2023 35.7 (H)  25.9 - 32.4 pg Final    MCHC 02/11/2023 34.7  32.0 - 36.0 g/dL Final    RDW 64/40/3474 22.9 (H)  12.2 - 15.2 % Final    MPV 02/11/2023 6.4 (L)  6.8 - 10.7 fL Final    Platelet 02/11/2023 107 (L)  150 - 450 10*9/L Final    Neutrophils % 02/11/2023 45.4  % Final    Lymphocytes % 02/11/2023 33.3  % Final    Monocytes % 02/11/2023 21.1  % Final    Eosinophils % 02/11/2023 0.0  % Final    Basophils % 02/11/2023 0.2  % Final    Absolute Neutrophils 02/11/2023 1.3 (L)  1.8 - 7.8 10*9/L Final    Absolute Lymphocytes 02/11/2023 1.0 (L)  1.1 - 3.6 10*9/L Final    Absolute Monocytes 02/11/2023 0.6  0.3 - 0.8 10*9/L Final    Absolute Eosinophils 02/11/2023 0.0  0.0 - 0.5 10*9/L Final    Absolute Basophils 02/11/2023 0.0  0.0 - 0.1 10*9/L Final    Macrocytosis 02/11/2023 Slight (A)  Not Present Final    Anisocytosis 02/11/2023 Marked (A)  Not Present Final          RED ZONE Means: RED ZONE: Take action now!     You need to be seen right away  Symptoms are at a severe level of discomfort    Call 911 or go to your nearest  Hospital for help     - Bleeding that will not stop    - Hard to breathe    - New seizure - Chest pain  - Fall or passing out  -Thoughts of hurting  yourself or others      Call 911 if you are going into the RED ZONE                  YELLOW ZONE Means:     Please call with any new or worsening symptom(s), even if not on this list.  Call [phone]  After hours, weekends, and holidays - you will reach a long recording with specific instructions, If not in an emergency such as above, please listen closely all the way to the end and choose the option that relates to your need.   You can be seen by a provider the same day through our Same Day Acute Care for Patients with Cancer program.      YELLOW ZONE: Take action today     Symptoms are new or worsening  You are not within your goal range for:    - Pain    - Shortness of breath    - Bleeding (nose, urine, stool, wound)    - Feeling sick to your stomach and throwing up    - Mouth sores/pain in your mouth or throat    - Hard stool or very loose stools (increase in       ostomy output)    - No urine for 12 hours    - Feeding tube or other catheter/tube issue    - Redness or pain at previous IV or port/catheter site    - Depressed or anxiety   - Swelling (leg, arm, abdomen,     face, neck)  - Skin rash or skin changes  - Wound issues (redness, drainage,    re-opened)  - Confusion  - Vision changes  - Fever >100.4 F or chills  - Worsening cough with mucus that is    green, yellow, or bloody  - Pain or burning when going to the    bathroom  - Home Infusion Pump Issue- call    [phone]         Call your healthcare provider if you are going into the YELLOW ZONE     GREEN ZONE Means:  Your symptoms are under controls  Continue to take your medicine as ordered  Keep all visits to the provider GREEN ZONE: You are in control  No increase or worsening symptoms  Able to take your medicine  Able to drink and eat    - DO NOT use MyChart messages to report red or yellow symptoms. Allow up to 3    business days for a reply.  -MyChart is for non-urgent medication refills, scheduling requests, or other general questions.         [id number] Rev. 05/26/2022  Approved by Oncology Patient Education Committee

## 2023-02-11 NOTE — Unmapped (Signed)
#  22 PIV placed to LAC, labs collected per orders, sent for analysis.   D4C3 Azacitidine 194.25 mg subcutaneous injections placed to LLQ abdomen. Pt tolerated injections without difficulties.   PIV flushed, discontinued.   Pt discharged from clinic in NAD, in stable condition, ambulatory.

## 2023-02-12 ENCOUNTER — Ambulatory Visit: Admit: 2023-02-12 | Discharge: 2023-02-13 | Payer: MEDICARE

## 2023-02-12 MED ADMIN — ondansetron (ZOFRAN) tablet 8 mg: 8 mg | ORAL | @ 21:00:00 | Stop: 2023-02-12

## 2023-02-12 MED ADMIN — azaCITIDine (VIDAZA) syringe: 75 mg/m2 | SUBCUTANEOUS | @ 21:00:00 | Stop: 2023-02-12

## 2023-02-13 ENCOUNTER — Ambulatory Visit: Admit: 2023-02-13 | Discharge: 2023-02-13 | Payer: MEDICARE

## 2023-02-13 ENCOUNTER — Telehealth: Admit: 2023-02-13 | Discharge: 2023-02-13 | Payer: MEDICARE

## 2023-02-13 DIAGNOSIS — C9001 Multiple myeloma in remission: Principal | ICD-10-CM

## 2023-02-13 MED ADMIN — azaCITIDine (VIDAZA) syringe: 75 mg/m2 | SUBCUTANEOUS | @ 20:00:00 | Stop: 2023-02-13

## 2023-02-13 MED ADMIN — ondansetron (ZOFRAN) tablet 8 mg: 8 mg | ORAL | @ 20:00:00 | Stop: 2023-02-13

## 2023-02-13 NOTE — Unmapped (Signed)
No visits with results within 1 Day(s) from this visit.   Latest known visit with results is:   Hospital Outpatient Visit on 02/11/2023   Component Date Value Ref Range Status    ABO Grouping 02/11/2023 A POS   Final    Antibody Screen 02/11/2023 NEG   Final    Sodium 02/11/2023 139  135 - 145 mmol/L Final    Potassium 02/11/2023 3.6  3.4 - 4.8 mmol/L Final    Chloride 02/11/2023 107  98 - 107 mmol/L Final    CO2 02/11/2023 28.0  20.0 - 31.0 mmol/L Final    Anion Gap 02/11/2023 4 (L)  5 - 14 mmol/L Final    BUN 02/11/2023 18  9 - 23 mg/dL Final    Creatinine 16/08/9603 0.93  0.73 - 1.18 mg/dL Final    BUN/Creatinine Ratio 02/11/2023 19   Final    eGFR CKD-EPI (2021) Male 02/11/2023 86  >=60 mL/min/1.61m2 Final    eGFR calculated with CKD-EPI 2021 equation in accordance with [employer] and [employer] Task Force recommendations.    Glucose 02/11/2023 128  70 - 179 mg/dL Final    Calcium 54/07/8118 8.6 (L)  8.7 - 10.4 mg/dL Final    Albumin 14/78/2956 3.8  3.4 - 5.0 g/dL Final    Total Protein 02/11/2023 6.4  5.7 - 8.2 g/dL Final    Total Bilirubin 02/11/2023 1.0  0.3 - 1.2 mg/dL Final    AST 21/30/8657 30  <=34 U/L Final    ALT 02/11/2023 42  10 - 49 U/L Final    Alkaline Phosphatase 02/11/2023 89  46 - 116 U/L Final    WBC 02/11/2023 2.9 (L)  3.6 - 11.2 10*9/L Final    RBC 02/11/2023 2.91 (L)  4.26 - 5.60 10*12/L Final    HGB 02/11/2023 10.4 (L)  12.9 - 16.5 g/dL Final    HCT 84/69/6295 29.9 (L)  39.0 - 48.0 % Final    MCV 02/11/2023 103.0 (H)  77.6 - 95.7 fL Final    MCH 02/11/2023 35.7 (H)  25.9 - 32.4 pg Final    MCHC 02/11/2023 34.7  32.0 - 36.0 g/dL Final    RDW 28/41/3244 22.9 (H)  12.2 - 15.2 % Final    MPV 02/11/2023 6.4 (L)  6.8 - 10.7 fL Final    Platelet 02/11/2023 107 (L)  150 - 450 10*9/L Final    Neutrophils % 02/11/2023 45.4  % Final    Lymphocytes % 02/11/2023 33.3  % Final    Monocytes % 02/11/2023 21.1  % Final    Eosinophils % 02/11/2023 0.0  % Final    Basophils % 02/11/2023 0.2  % Final    Absolute Neutrophils 02/11/2023 1.3 (L)  1.8 - 7.8 10*9/L Final    Absolute Lymphocytes 02/11/2023 1.0 (L)  1.1 - 3.6 10*9/L Final    Absolute Monocytes 02/11/2023 0.6  0.3 - 0.8 10*9/L Final    Absolute Eosinophils 02/11/2023 0.0  0.0 - 0.5 10*9/L Final    Absolute Basophils 02/11/2023 0.0  0.0 - 0.1 10*9/L Final    Macrocytosis 02/11/2023 Slight (A)  Not Present Final    Anisocytosis 02/11/2023 Marked (A)  Not Present Final          RED ZONE Means: RED ZONE: Take action now!     You need to be seen right away  Symptoms are at a severe level of discomfort    Call 911 or go to your nearest  Hospital for help     - Bleeding that will not stop    - Hard to breathe    - New seizure - Chest pain  - Fall or passing out  -Thoughts of hurting    yourself or others      Call 911 if you are going into the RED ZONE                  YELLOW ZONE Means:     Please call with any new or worsening symptom(s), even if not on this list.  Call [phone]  After hours, weekends, and holidays - you will reach a long recording with specific instructions, If not in an emergency such as above, please listen closely all the way to the end and choose the option that relates to your need.   You can be seen by a provider the same day through our Same Day Acute Care for Patients with Cancer program.      YELLOW ZONE: Take action today     Symptoms are new or worsening  You are not within your goal range for:    - Pain    - Shortness of breath    - Bleeding (nose, urine, stool, wound)    - Feeling sick to your stomach and throwing up    - Mouth sores/pain in your mouth or throat    - Hard stool or very loose stools (increase in       ostomy output)    - No urine for 12 hours    - Feeding tube or other catheter/tube issue    - Redness or pain at previous IV or port/catheter site    - Depressed or anxiety   - Swelling (leg, arm, abdomen,     face, neck)  - Skin rash or skin changes  - Wound issues (redness, drainage,    re-opened)  - Confusion  - Vision changes  - Fever >100.4 F or chills  - Worsening cough with mucus that is    green, yellow, or bloody  - Pain or burning when going to the    bathroom  - Home Infusion Pump Issue- call    [phone]         Call your healthcare provider if you are going into the YELLOW ZONE     GREEN ZONE Means:  Your symptoms are under controls  Continue to take your medicine as ordered  Keep all visits to the provider GREEN ZONE: You are in control  No increase or worsening symptoms  Able to take your medicine  Able to drink and eat    - DO NOT use MyChart messages to report red or yellow symptoms. Allow up to 3    business days for a reply.  -MyChart is for non-urgent medication refills, scheduling requests, or other general questions.         [id number] Rev. 05/26/2022  Approved by Oncology Patient Education Committee

## 2023-02-13 NOTE — Unmapped (Signed)
D6C3 Azacitidine 194.25 mg subcutaneous injections placed to LLQ abdomen. Pt tolerated injections without difficulties. Discharged from clinic in NAD, in stable condition, ambulatory.

## 2023-02-13 NOTE — Unmapped (Signed)
No visits with results within 1 Day(s) from this visit.   Latest known visit with results is:   Hospital Outpatient Visit on 02/11/2023   Component Date Value Ref Range Status    ABO Grouping 02/11/2023 A POS   Final    Antibody Screen 02/11/2023 NEG   Final    Sodium 02/11/2023 139  135 - 145 mmol/L Final    Potassium 02/11/2023 3.6  3.4 - 4.8 mmol/L Final    Chloride 02/11/2023 107  98 - 107 mmol/L Final    CO2 02/11/2023 28.0  20.0 - 31.0 mmol/L Final    Anion Gap 02/11/2023 4 (L)  5 - 14 mmol/L Final    BUN 02/11/2023 18  9 - 23 mg/dL Final    Creatinine 81/19/1478 0.93  0.73 - 1.18 mg/dL Final    BUN/Creatinine Ratio 02/11/2023 19   Final    eGFR CKD-EPI (2021) Male 02/11/2023 86  >=60 mL/min/1.24m2 Final    eGFR calculated with CKD-EPI 2021 equation in accordance with [employer] and [employer] Task [employer] recommendations.    Glucose 02/11/2023 128  70 - 179 mg/dL Final    Calcium 29/56/2130 8.6 (L)  8.7 - 10.4 mg/dL Final    Albumin 86/57/8469 3.8  3.4 - 5.0 g/dL Final    Total Protein 02/11/2023 6.4  5.7 - 8.2 g/dL Final    Total Bilirubin 02/11/2023 1.0  0.3 - 1.2 mg/dL Final    AST 62/95/2841 30  <=34 U/L Final    ALT 02/11/2023 42  10 - 49 U/L Final    Alkaline Phosphatase 02/11/2023 89  46 - 116 U/L Final    WBC 02/11/2023 2.9 (L)  3.6 - 11.2 10*9/L Final    RBC 02/11/2023 2.91 (L)  4.26 - 5.60 10*12/L Final    HGB 02/11/2023 10.4 (L)  12.9 - 16.5 g/dL Final    HCT 32/44/0102 29.9 (L)  39.0 - 48.0 % Final    MCV 02/11/2023 103.0 (H)  77.6 - 95.7 fL Final    MCH 02/11/2023 35.7 (H)  25.9 - 32.4 pg Final    MCHC 02/11/2023 34.7  32.0 - 36.0 g/dL Final    RDW 72/53/6644 22.9 (H)  12.2 - 15.2 % Final    MPV 02/11/2023 6.4 (L)  6.8 - 10.7 fL Final    Platelet 02/11/2023 107 (L)  150 - 450 10*9/L Final    Neutrophils % 02/11/2023 45.4  % Final    Lymphocytes % 02/11/2023 33.3  % Final    Monocytes % 02/11/2023 21.1  % Final    Eosinophils % 02/11/2023 0.0  % Final Basophils % 02/11/2023 0.2  % Final    Absolute Neutrophils 02/11/2023 1.3 (L)  1.8 - 7.8 10*9/L Final    Absolute Lymphocytes 02/11/2023 1.0 (L)  1.1 - 3.6 10*9/L Final    Absolute Monocytes 02/11/2023 0.6  0.3 - 0.8 10*9/L Final    Absolute Eosinophils 02/11/2023 0.0  0.0 - 0.5 10*9/L Final    Absolute Basophils 02/11/2023 0.0  0.0 - 0.1 10*9/L Final    Macrocytosis 02/11/2023 Slight (A)  Not Present Final    Anisocytosis 02/11/2023 Marked (A)  Not Present Final

## 2023-02-13 NOTE — Unmapped (Signed)
Pt in chair 6, here for injection  Pending meds from pharmacy    Tolerated injection right abd. Denies reactions from previous injection sites.    AVS declined  Ambulatory for dc home

## 2023-02-13 NOTE — Unmapped (Unsigned)
[facility] Return Visit     REASON FOR VISIT: routine follow up. This visit included intensive monitoring of high-risk medications, namely MM chemotherapy, given risk of severe myelosuppression, infection, and other toxicities.  Review included CBC, electrolytes and other blood tests, as well as direct assessment of the patient for symptoms suggesting toxicity.     Multiple myeloma, kappa free light chain  Stage: ISS 1, R-ISS 1, R2-ISS group 1  Risk stratification: standard risk (R-ISS 1, no high-risk CA, low-risk GEP)  Prognosis: based on R2-ISS group 1, median OS not reached and PFS 5.7 years (D'Agostino et al., JCO 2022); based on R-ISS 1, estimated 5-year OS and PFS of 82% and 55% (Palumbo JCO 2015)  Presentation: R hip pain, lytic lesion in R femur  Treatment history: RVD x4 (02/19/19-) - ASCT, mel 140 mg/m2 (05/29/19; VGPR) - Len maintenance, [id number] (10/22/19-08/2022; CR, MRD-neg)   Current Chemotherapy: none, observation off therapy in setting of recent AML diagnosis  Antiresorptive therapy: zoledronic acid 01/2019-04/2022    AML, 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 Plan for treatment with aza/ven on BEAT AML trial. Continues twice weekly labs/transfusions.    ASSESSMENT: 77 y.o. 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 No signs/symptoms to suggest progression of multiple myeloma and we will plan to defer any further multiple myeloma therapy for now. The patient has established care with [name] with plan to start aza/ven on BEAT AML trial. If there is future evidence of multiple myeloma progression requiring treatment, would at that time consider adding daratumumab monotherapy to AML-directed therapy.    PLAN:  AML-directed therapy per leukemia team  Continue transfusion support per leukemia team  Defer multiple myeloma-directed therapy at this time; permanently discontinue lenalidomide     I personally spent 30 minutes face-to-face and non-face-to-face in the care of this patient, which includes all pre, intra, and post visit time on the date of service     [name], MD  Assistant Professor of Medicine  Division of Hematology, Myeloma Program    Nurse Navigator: Desmond Dike, RN  Questions and appointments M-F 8am - 5pm: [phone] or [phone]       INTERIM HISTORY:   The patient presents today for routine follow up.  Established with [name]. Feels ok overall. Reports stressful situation at home related to ongoing bathroom and deck renovations in the context of recent AML diagnosis. Wife is now back from [place].   Asks about appointments with leukemia team beyond next week and timeline for starting AML-directed therapy.      HEMATOLOGICAL / ONCOLOGICAL HISTORY:    1. Kappa free light chain multiple myeloma (MM), ISS 1, R-ISS 1, MyPRS low risk, no high-risk CA  A. Presentation: lytic lesion of R femur  B. Initial workup:  SPEP irregularity and decrease in the gamma region (no IFE) (01/03/19)  Serum free kappa 125.2 mg/dL, lambda <4.74 mg/dL, ratio >259 (5/63/87)  WBC 9.8, Hgb 14.3, Plts 327  Cr 1.05, Ca 9.4  Beta-2-microglobulin 2.58  Albumin 4.3  LDH 395 (normal)  PET/CT: diffuse moderate FDG uptake throughout axial and proximal appendicular skeleton with innumerable lytic osseous lesions. Nonspecific focus of FDG avidity surrounding R hip prosthesis.   MRI RLE: abnormal marrow signal, 3.3 cm T2 hyperintense enhancing oval lesion  with endosteal scalloping at tip of femoral stem, without cortical breakthrough.  Bone marrow: 70% cellularity; 40-80% plasmacytosis; cytogenetics: normal; FISH monosomy 13, CCND1/IGH fusion [t(11;14]; MyPRS score 35, low risk  C. XRT (02/13/19): 8Gy in single fraction to symptomatic right femur lesions adjacent to hip implant  D. RVD (C1 = 02/19/19; C2 = 03/12/19; C3 = 04/02/19; C4 = 04/23/19): starting kappa FLC 151 mg/dL; best response = VGPR    BM biopsy 05/13/19: 70% cellularity, 10-20% plasmacytosis  E. ASCT, melphalan 140 mg/m2 (05/29/19)   07/09/2019: kappa FLC 8.58 mg/dL   16/08/9603: serum free kappa 5.46 mg/dL, lambda 5.40 mg/dL, ratio 9.81; SPEP/IFE negative; 24h UPEP kappa FLC TLTQ  F. Lenalidomide on [id number] (10/22/19)    12/16/2019: SPEP/IFE negative; serum free kappa 3.02 mg/dL, lambda 9.56 mg/dL, ratio  2.13    08/65/7846: SPEP/IFE negative; serum free kappa 2.58 mg/dL, lambda 9.62 mg/dL, ratio 9.52    84/13/2440: SPEP/IFE negative; serum free kappa 2.72, lambda 1.68, ratio 1.62   09/20/2020: SPEP/IFE negative serum free kappa 3.01, lambda 2.09, ratio 1.44   11/16/2020: SPEP/IFE negative; serum free kappa 2.72, lambda 2.01, FLC ratio 1.35   02/08/2021: SPEP/IFE negative; serum free kappa 2.42, lambda 1.76, FLC ratio 1.38   05/03/2021: SPEP/IFE negative; serum free kappa 2.37 mg/dL, lambda 1.02 mg/dL, ratio 7.25    36/64/4034: SPEP/IFE negative; serum free kappa 3.59 mg/dL, lambda 7.42 mg/dL, ratio 5.95    63/87/5643: BM biopsy -- slightly hypocellular, no morphologic/immunophenotypic evidence of plasma cell neoplasm      08/22/2022: PET/CT -- negative; 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    ALLERGIES: Patient has no known allergies.    MEDICATIONS:    Current Outpatient Medications:     calcium carbonate (TUMS) 200 mg calcium (500 mg) chewable tablet, Chew 2 tablets (400 mg of elem calcium total) Three (3) times a day., Disp: , Rfl:     cholecalciferol, vitamin D3 25 mcg, 1,000 units,, 1,000 unit (25 mcg) tablet, Take 1 tablet (25 mcg total) by mouth., Disp: , Rfl:     cyanocobalamin, vitamin B-12, 2,000 mcg Tab, Take 2,000 mcg by mouth in the morning., Disp: 90 tablet, Rfl: 3    docusate sodium (COLACE) 50 MG capsule, Take 1 capsule (50 mg total) by mouth two (2) times a day., Disp: , Rfl:     loratadine (CLARITIN) 10 mg tablet, Take 1 tablet (10 mg total) by mouth daily., Disp: , Rfl:     metoPROLOL succinate (TOPROL-XL) 50 MG 24 hr tablet, Take 1 tablet (50 mg total) by mouth daily., Disp: 30 tablet, Rfl: 11    multivitamin with minerals tablet, Take 1 tablet by mouth daily., Disp: , Rfl:     omeprazole (PRILOSEC) 40 MG capsule, Take 1 capsule (40 mg total) by mouth daily., Disp: 90 capsule, Rfl: 0    ondansetron (ZOFRAN) 8 MG tablet, Take 1 tablet (8 mg total) by mouth every eight (8) hours as needed for nausea., Disp: 30 tablet, Rfl: 2    sildenafiL, pulm.hypertension, (REVATIO) 20 mg tablet, Take 1-5 tablets (20-100 mg total) by mouth daily as needed (for erectile function)., Disp: 90 tablet, Rfl: 3    tamsulosin (FLOMAX) 0.4 mg capsule, Take 1 capsule (0.4 mg total) by mouth daily., Disp: 90 capsule, Rfl: 0    valACYclovir (VALTREX) 500 MG tablet, Take 1 tablet (500 mg total) by mouth daily., Disp: 90 tablet, Rfl: 3    venetoclax (VENCLEXTA) 100 mg tablet, Take 4 tablets (400 mg  total) by mouth daily for 21 days., Disp: 84 tablet, Rfl: 1  No current facility-administered medications for this visit.    Facility-Administered Medications Ordered in Other Visits:     azaCITIDine (VIDAZA) syringe, 75 mg/m2 (Treatment Plan Recorded), Subcutaneous, Once, Doreatha Lew, MD    dexAMETHasone (DECADRON) 4 mg/mL injection 20 mg, 20 mg, Intravenous, Once PRN, Doreatha Lew, MD    diphenhydrAMINE (BENADRYL) injection 25 mg, 25 mg, Intravenous, Once PRN, Doreatha Lew, MD    DTaP-hepatitis B recombinant-IPV (PEDIARIX) 10 mcg-25Lf-25 mcg-10Lf/0.5 mL injection, , , ,     DTaP-hepatitis B recombinant-IPV (PEDIARIX) 10 mcg-25Lf-25 mcg-10Lf/0.5 mL injection, , , ,     EPINEPHrine (EPIPEN) injection 0.3 mg, 0.3 mg, Intramuscular, Once PRN, Doreatha Lew, MD    famotidine (PF) (PEPCID) injection 20 mg, 20 mg, Intravenous, Once PRN, Doreatha Lew, MD    haemophilus B polysac-tetanus toxoid (ActHIB) 10 mcg/0.5 mL injection, , , ,     influenza vaccine quad (FLUARIX, FLULAVAL, FLUZONE) (6 MOS & UP) 2020-21 ADS Med, , , ,     methylPREDNISolone sodium succinate (PF) (SOLU-Medrol) injection 125 mg, 125 mg, Intravenous, Once PRN, Doreatha Lew, MD    OKAY TO SEND MEDICATION/CHEMOTHERAPY TO OUTPATIENT UNIT, , Other, Once, Doreatha Lew, MD    ondansetron [facility]) injection 8 mg, 8 mg, Intravenous, Once PRN, Doreatha Lew, MD    ondansetron [facility]) tablet 8 mg, 8 mg, Oral, Once, Doreatha Lew, MD    pneumococcal conjugate (13-valent) (PREVNAR-13) 0.5 mL vaccine, , , ,     pneumococcal conjugate (13-valent) (PREVNAR-13) 0.5 mL vaccine, , , ,     sodium chloride (NS) 0.9 % infusion, 20 mL/hr, Intravenous, Continuous PRN, Doreatha Lew, MD    sodium chloride 0.9% (NS) bolus 1,000 mL, 1,000 mL, Intravenous, Once PRN, Doreatha Lew, MD    varicella-zoster [id number] (PF) [facility]) 50 mcg/0.5 mL injection, , , ,       SOCIAL HISTORY: No tobacco, alcohol or drug use. 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 Walking for routine exercise.    FAMILY HISTORY: No blood disorders or cancer, sudden cardiac death or unexplained cardiac disease    VITAL SIGNS: There were no vitals taken for this visit.    PHYSICAL EXAM:  GENERAL: Patient appears well and in no obvious distress.  HEENT: EOMI, no scleral icterus.  CV: Regular rate and rhythm with no murmurs. No edema.  PULM: Clear to auscultation bilaterally.  GI: Abdomen nondistended  MUSCSK: No palpable abnormalities. No spinal/paraspinal tenderness.  NEURO: A&Ox4  SKIN: no rashes or lesions    LABS:  Lab Results   Component Value Date    WBC 2.9 (L) 02/11/2023    HGB 10.4 (L) 02/11/2023    HCT 29.9 (L) 02/11/2023    PLT 107 (L) 02/11/2023    NEUTROABS 1.3 (L) 02/11/2023    LYMPHSABS 1.0 (L) 02/11/2023    MCV 103.0 (H) 02/11/2023    NA 139 02/11/2023    K 3.6 02/11/2023    CL 107 02/11/2023    CO2 28.0 02/11/2023    BUN 18 02/11/2023    CREATININE 0.93 02/11/2023    GLU 128 02/11/2023    CALCIUM 8.6 (L) 02/11/2023    MG 1.8 02/08/2023    PHOS 3.1 01/31/2023    URICACID 4.5 02/08/2023    BILITOT 1.0 02/11/2023    BILIDIR <0.10 07/03/2019    PROT 6.4 02/11/2023  ALBUMIN 3.8 02/11/2023    ALT 42 02/11/2023    AST 30 02/11/2023    ALKPHOS 89 02/11/2023    GGT 64 06/28/2019    INR 0.99 11/17/2022    APTT 26.2 11/17/2022    B2MG S 2.32 08/24/2020    LDH 224 02/08/2023    IGG 992 02/08/2023    IGM 9 (L) 02/08/2023    IGA 29.2 (L) 02/08/2023    SPEINTERP  02/08/2023      Comment:      The SPE pattern demonstrates a slight irregularity in the gamma region, which may represent monoclonal protein. See Immunofixation report.      IFE  02/08/2023      Comment:      No monoclonal protein detected.      KAPFS 1.07 02/08/2023    LAMFS 0.64 02/08/2023    RATKL 1.67 (H) 02/08/2023    IFEUR  05/13/2019      Comment:      Monoclonal component typed as free Kappa light chains.               Lab Results   Component Value Date    HEPBSAB Nonreactive 09/03/2019    HEPBCAB Nonreactive 09/03/2019    HEPCAB Nonreactive 10/24/2022

## 2023-02-14 ENCOUNTER — Other Ambulatory Visit: Admit: 2023-02-14 | Discharge: 2023-02-15 | Payer: MEDICARE

## 2023-02-14 ENCOUNTER — Ambulatory Visit: Admit: 2023-02-14 | Discharge: 2023-02-15 | Payer: MEDICARE

## 2023-02-14 LAB — COMPREHENSIVE METABOLIC PANEL
ALBUMIN: 3.9 g/dL (ref 3.4–5.0)
ALKALINE PHOSPHATASE: 84 U/L (ref 46–116)
ALT (SGPT): 41 U/L (ref 10–49)
ANION GAP: 8 mmol/L (ref 5–14)
AST (SGOT): 28 U/L (ref <=34)
BILIRUBIN TOTAL: 0.9 mg/dL (ref 0.3–1.2)
BLOOD UREA NITROGEN: 20 mg/dL (ref 9–23)
BUN / CREAT RATIO: 19
CALCIUM: 8.9 mg/dL (ref 8.7–10.4)
CHLORIDE: 105 mmol/L (ref 98–107)
CO2: 27 mmol/L (ref 20.0–31.0)
CREATININE: 1.06 mg/dL
EGFR CKD-EPI (2021) MALE: 73 mL/min/{1.73_m2} (ref >=60)
GLUCOSE RANDOM: 135 mg/dL (ref 70–179)
POTASSIUM: 3.5 mmol/L (ref 3.4–4.8)
PROTEIN TOTAL: 6.5 g/dL (ref 5.7–8.2)
SODIUM: 140 mmol/L (ref 135–145)

## 2023-02-14 LAB — CBC W/ AUTO DIFF
BASOPHILS ABSOLUTE COUNT: 0 10*9/L (ref 0.0–0.1)
BASOPHILS RELATIVE PERCENT: 0.2 %
EOSINOPHILS ABSOLUTE COUNT: 0 10*9/L (ref 0.0–0.5)
EOSINOPHILS RELATIVE PERCENT: 0 %
HEMATOCRIT: 31.2 % — ABNORMAL LOW (ref 39.0–48.0)
HEMOGLOBIN: 10.8 g/dL — ABNORMAL LOW (ref 12.9–16.5)
LYMPHOCYTES ABSOLUTE COUNT: 1.6 10*9/L (ref 1.1–3.6)
LYMPHOCYTES RELATIVE PERCENT: 32.3 %
MEAN CORPUSCULAR HEMOGLOBIN CONC: 34.5 g/dL (ref 32.0–36.0)
MEAN CORPUSCULAR HEMOGLOBIN: 35.8 pg — ABNORMAL HIGH (ref 25.9–32.4)
MEAN CORPUSCULAR VOLUME: 103.8 fL — ABNORMAL HIGH (ref 77.6–95.7)
MEAN PLATELET VOLUME: 6.8 fL (ref 6.8–10.7)
MONOCYTES ABSOLUTE COUNT: 0.6 10*9/L (ref 0.3–0.8)
MONOCYTES RELATIVE PERCENT: 11.5 %
NEUTROPHILS ABSOLUTE COUNT: 2.8 10*9/L (ref 1.8–7.8)
NEUTROPHILS RELATIVE PERCENT: 56 %
PLATELET COUNT: 147 10*9/L — ABNORMAL LOW (ref 150–450)
RED BLOOD CELL COUNT: 3.01 10*12/L — ABNORMAL LOW (ref 4.26–5.60)
RED CELL DISTRIBUTION WIDTH: 21.5 % — ABNORMAL HIGH (ref 12.2–15.2)
WBC ADJUSTED: 4.9 10*9/L (ref 3.6–11.2)

## 2023-02-14 MED ADMIN — ondansetron (ZOFRAN) tablet 8 mg: 8 mg | ORAL | @ 19:00:00 | Stop: 2023-02-14

## 2023-02-14 MED ADMIN — azaCITIDine (VIDAZA) syringe: 75 mg/m2 | SUBCUTANEOUS | @ 19:00:00 | Stop: 2023-02-14

## 2023-02-15 NOTE — Unmapped (Signed)
Pt tolerated chemo injections without difficulty.  Pt was stable at discharge via self ambulation with steady gait.

## 2023-02-15 NOTE — Unmapped (Signed)
RED ZONE Means: RED ZONE: Take action now!     You need to be seen right away  Symptoms are at a severe level of discomfort    Call 911 or go to your nearest  Hospital for help     - Bleeding that will not stop    - Hard to breathe    - New seizure - Chest pain  - Fall or passing out  -Thoughts of hurting    yourself or others      Call 911 if you are going into the RED ZONE                  YELLOW ZONE Means:     Please call with any new or worsening symptom(s), even if not on this list.  Call [phone]  After hours, weekends, and holidays - you will reach a long recording with specific instructions, If not in an emergency such as above, please listen closely all the way to the end and choose the option that relates to your need.   You can be seen by a provider the same day through our Same Day Acute Care for Patients with Cancer program.      YELLOW ZONE: Take action today     Symptoms are new or worsening  You are not within your goal range for:    - Pain    - Shortness of breath    - Bleeding (nose, urine, stool, wound)    - Feeling sick to your stomach and throwing up    - Mouth sores/pain in your mouth or throat    - Hard stool or very loose stools (increase in       ostomy output)    - No urine for 12 hours    - Feeding tube or other catheter/tube issue    - Redness or pain at previous IV or port/catheter site    - Depressed or anxiety   - Swelling (leg, arm, abdomen,     face, neck)  - Skin rash or skin changes  - Wound issues (redness, drainage,    re-opened)  - Confusion  - Vision changes  - Fever >100.4 F or chills  - Worsening cough with mucus that is    green, yellow, or bloody  - Pain or burning when going to the    bathroom  - Home Infusion Pump Issue- call    [phone]         Call your healthcare provider if you are going into the YELLOW ZONE     GREEN ZONE Means:  Your symptoms are under controls  Continue to take your medicine as ordered  Keep all visits to the provider GREEN ZONE: You are in control  No increase or worsening symptoms  Able to take your medicine  Able to drink and eat    - DO NOT use MyChart messages to report red or yellow symptoms. Allow up to 3    business days for a reply.  -MyChart is for non-urgent medication refills, scheduling requests, or other general questions.         [id number] Rev. 05/26/2022  Approved by Oncology Patient Education Committee

## 2023-02-22 ENCOUNTER — Ambulatory Visit: Admit: 2023-02-22 | Discharge: 2023-02-22 | Payer: MEDICARE | Attending: Adult Health | Primary: Adult Health

## 2023-02-22 ENCOUNTER — Ambulatory Visit: Admit: 2023-02-22 | Discharge: 2023-02-22 | Payer: MEDICARE

## 2023-02-22 ENCOUNTER — Other Ambulatory Visit: Admit: 2023-02-22 | Discharge: 2023-02-22 | Payer: MEDICARE

## 2023-02-22 ENCOUNTER — Encounter: Admit: 2023-02-22 | Discharge: 2023-02-22 | Payer: MEDICARE | Attending: Hematology | Primary: Hematology

## 2023-02-22 DIAGNOSIS — C9200 Acute myeloblastic leukemia, not having achieved remission: Principal | ICD-10-CM

## 2023-02-22 DIAGNOSIS — E876 Hypokalemia: Principal | ICD-10-CM

## 2023-02-22 DIAGNOSIS — C9000 Multiple myeloma not having achieved remission: Principal | ICD-10-CM

## 2023-02-22 LAB — CBC W/ AUTO DIFF
BASOPHILS ABSOLUTE COUNT: 0 10*9/L (ref 0.0–0.1)
BASOPHILS RELATIVE PERCENT: 0.3 %
EOSINOPHILS ABSOLUTE COUNT: 0 10*9/L (ref 0.0–0.5)
EOSINOPHILS RELATIVE PERCENT: 0 %
HEMATOCRIT: 30.1 % — ABNORMAL LOW (ref 39.0–48.0)
HEMOGLOBIN: 10.8 g/dL — ABNORMAL LOW (ref 12.9–16.5)
LYMPHOCYTES ABSOLUTE COUNT: 1.2 10*9/L (ref 1.1–3.6)
LYMPHOCYTES RELATIVE PERCENT: 28.5 %
MEAN CORPUSCULAR HEMOGLOBIN CONC: 35.8 g/dL (ref 32.0–36.0)
MEAN CORPUSCULAR HEMOGLOBIN: 36.9 pg — ABNORMAL HIGH (ref 25.9–32.4)
MEAN CORPUSCULAR VOLUME: 103.2 fL — ABNORMAL HIGH (ref 77.6–95.7)
MEAN PLATELET VOLUME: 6.7 fL — ABNORMAL LOW (ref 6.8–10.7)
MONOCYTES ABSOLUTE COUNT: 0.3 10*9/L (ref 0.3–0.8)
MONOCYTES RELATIVE PERCENT: 8.1 %
NEUTROPHILS ABSOLUTE COUNT: 2.6 10*9/L (ref 1.8–7.8)
NEUTROPHILS RELATIVE PERCENT: 63.1 %
PLATELET COUNT: 91 10*9/L — ABNORMAL LOW (ref 150–450)
RED BLOOD CELL COUNT: 2.91 10*12/L — ABNORMAL LOW (ref 4.26–5.60)
RED CELL DISTRIBUTION WIDTH: 20.4 % — ABNORMAL HIGH (ref 12.2–15.2)
WBC ADJUSTED: 4.2 10*9/L (ref 3.6–11.2)

## 2023-02-22 LAB — LACTATE DEHYDROGENASE: LACTATE DEHYDROGENASE: 222 U/L (ref 120–246)

## 2023-02-22 LAB — COMPREHENSIVE METABOLIC PANEL
ALBUMIN: 3.8 g/dL (ref 3.4–5.0)
ALBUMIN: 3.9 g/dL (ref 3.4–5.0)
ALKALINE PHOSPHATASE: 74 U/L (ref 46–116)
ALKALINE PHOSPHATASE: 77 U/L (ref 46–116)
ALT (SGPT): 41 U/L (ref 10–49)
ALT (SGPT): 42 U/L (ref 10–49)
ANION GAP: 7 mmol/L (ref 5–14)
ANION GAP: 7 mmol/L (ref 5–14)
AST (SGOT): 30 U/L (ref <=34)
AST (SGOT): 32 U/L (ref <=34)
BILIRUBIN TOTAL: 1.1 mg/dL (ref 0.3–1.2)
BILIRUBIN TOTAL: 1.2 mg/dL (ref 0.3–1.2)
BLOOD UREA NITROGEN: 17 mg/dL (ref 9–23)
BLOOD UREA NITROGEN: 18 mg/dL (ref 9–23)
BUN / CREAT RATIO: 20
BUN / CREAT RATIO: 22
CALCIUM: 8.6 mg/dL — ABNORMAL LOW (ref 8.7–10.4)
CALCIUM: 8.8 mg/dL (ref 8.7–10.4)
CHLORIDE: 106 mmol/L (ref 98–107)
CHLORIDE: 108 mmol/L — ABNORMAL HIGH (ref 98–107)
CO2: 28 mmol/L (ref 20.0–31.0)
CO2: 28 mmol/L (ref 20.0–31.0)
CREATININE: 0.82 mg/dL
CREATININE: 0.84 mg/dL
EGFR CKD-EPI (2021) MALE: >90 mL/min/{1.73_m2} (ref >=60)
EGFR CKD-EPI (2021) MALE: >90 mL/min/{1.73_m2} (ref >=60)
GLUCOSE RANDOM: 109 mg/dL (ref 70–179)
GLUCOSE RANDOM: 113 mg/dL (ref 70–179)
POTASSIUM: 3.2 mmol/L — ABNORMAL LOW (ref 3.4–4.8)
POTASSIUM: 3.3 mmol/L — ABNORMAL LOW (ref 3.4–4.8)
PROTEIN TOTAL: 6.4 g/dL (ref 5.7–8.2)
PROTEIN TOTAL: 6.4 g/dL (ref 5.7–8.2)
SODIUM: 141 mmol/L (ref 135–145)
SODIUM: 143 mmol/L (ref 135–145)

## 2023-02-22 LAB — URIC ACID: URIC ACID: 3.9 mg/dL

## 2023-02-22 LAB — MAGNESIUM: MAGNESIUM: 1.6 mg/dL (ref 1.6–2.6)

## 2023-02-22 NOTE — Unmapped (Signed)
K given per order. Pt discharged with no further needs.

## 2023-02-23 NOTE — Unmapped (Signed)
[facility] Leukemia Clinic Visit Note     Patient Name: Timothy Porter  Patient Age: 77 y.o.  Encounter Date: 01/31/2023    Primary Care Provider:  Pcp, None Per Patient    Referring Physician:  Pcp, None Per Patient  [address] [place],  [place] [address]    Reason for visit:  AML     Assessment:  Timothy Porter is a 77 y.o. 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  He enrolled on BAML S12 arm (ven x 28 days) and began treatment on 11/22/2022. After 1 cycle of treatment he was found to be in CR without MRD.  He has completed 2 cycles of treatment and presents to clinic today for follow up.     [name] has tolerated treatment on aza/ven well without toxicities or complications, outside of delayed count recovery.  He had a repeat marrow which I reviewed with him.  I shows continued CR without flow MRD.  On lab reivew his ANC is 0.1 today, therefore we will plan to hold cycle 3 at this time to allow for hematologic recovery.  Once ANC >/= 1.0 and plt >/= 50 we will plan to proceed with cycle 3 with DR'd azacitidine 75mg /m2 x 7 days + venetoclax 400mg  x 21 days.  I will have him hold venetoclax starting today.  I would also like for him to restart Levaquin 500mg  daily.  He is not transfusion dependent, therefore no additional lab monitoring required.  Continue Valtrex.  Return to clinic in 1 week.      Free Kappa Chain MM:  He will continue follow up with MM team and has upcoming appt with Dr. Melrose Porter on 02/13/2023 Diarrhea: Chronic and long standing.  Currently taking cholestyramine, but continues to have loose stools with urgency. Will reach out to MM team to inquire about stopping cholestyramine and starting other anti-diarrheals.                                                  Plan:   - hold venetoclax  - restart levaquin 500mg  daily  - once ANC >/= 1.0 and plt >/= 50 proceed with cycle 3 DR'd azacitidine 75mg /m2 x 7 days + venetoclax 400mg  x 21 days  - continue valtrex  - RTC in 1 week for consideration of cycle 3      [name] was available    [name], AGNP-BC  Leukemia Research Nurse Practitioner  Hematology/Oncology Division  [facility]  01/31/2023    I personally spent 50 minutes face-to-face and non-face-to-face in the care of this patient, which includes all pre, intra, and post visit time on the date of service.  All documented time was specific to the E/M visit and does not include any procedures that may have been performed.    Hematology/Oncology History Overview Note   Referring/Local Oncologist:  Dr.  Lichtman     Diagnosis:   Diagnosis   Date Value Ref Range Status   10/26/2022   Final    Bone marrow, left iliac, aspiration and biopsy  -  Hypercellular bone marrow (50%) involved by acute myeloid leukemia with MECOM rearrangement (23% blasts by manual aspirate differential and 10-20% CD34-positive blasts by immunohistochemistry)   -  Mild to focally moderate marrow fibrosis (MF-1 to 2)   -  Less than 5% plasma cells by manual aspirate differential count (see Comment)  -  See linked reports for associated Ancillary Studies.      This electronic signature is attestation that the pathologist personally reviewed the submitted material(s) and the final diagnosis reflects that evaluation.          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,XY[7]         Myeloid Mutational Panel: Variants of Known/Likely Clinical Significance:   Gene Coding Predicted Protein Variant allele fraction   SRSF2 c.284C>G p.(Pro95Arg) 21.6 %       Treatment Timeline:  11/22/2022 Cycle 1:  BAML S12 azacitidine 75mg /m2 x 7 days + venetoclax 400mg  x 28 days  12/13/22: Bmbx - Limited, though no increase in blasts, MRD-neg by flow, poor specimen but no blasts (at <0.2%)   01/04/2023 Cycle 2: aza 75 x 7 + ven 400 x 28 days (2 week delay)  01/23/2023: BMbx: <5% blasts by CD34 immunohistochemistry, flow MRD negative  02/09/2023: Cycle 3: Aza 75 x 7 + ven 400 x 21       Multiple myeloma (CMS-HCC)   02/12/2019 Initial Diagnosis    Multiple myeloma (CMS-HCC)     05/28/2019 - 06/03/2019 Chemotherapy    BMT IP AUTO MELPHALAN  Melphalan 140 mg/m2 or 200 mg/m2 IV Day -1     10/22/2019 - 06/27/2022 Chemotherapy    STUDY [id number] IRB# [id number] LENALIDOMIDE (v. 11/04/18)  A Randomized Study of Daratumumab Plus Lenalidomide Versus Lenalidomide Alone as Maintenance Treatment in Patients with Newly Diagnosed Multiple Myeloma Who Are Minimal Residual Disease Positive After Frontline Autologous Stem Cell Transplant.     History of auto stem cell transplant (CMS-HCC) (Resolved)   08/05/2019 Initial Diagnosis    History of auto stem cell transplant (CMS-HCC)     10/22/2019 - 06/27/2022 Chemotherapy    STUDY [id number] IRB# [id number] LENALIDOMIDE (v. 11/04/18)  A Randomized Study of Daratumumab Plus Lenalidomide Versus Lenalidomide Alone as Maintenance Treatment in Patients with Newly Diagnosed Multiple Myeloma Who Are Minimal Residual Disease Positive After Frontline Autologous Stem Cell Transplant.     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 Regina states he has been feeling pretty good. He continues to have some occasional dizzy spells.  He has loose stools and urgency daily and this has been ongoing for him, but he did report today that his bowel habits tend to keep him from doing things.  Denies any n/v.  No new cough/sob.  No new fevers/chills.      Otherwise, he denies new constitutional symptoms such as anorexia, weight loss, fatigue, night sweats or unexplained fevers.  Furthermore, he denies unexplained bleeding or bruising, recurrent or unexplained intercurrent infections, dyspnea on exertion, lightheadedness, palpitations or chest pain.  There have  been no new or unexplained pains or self-identified masses, swelling or enlarged lymph nodes.    ROS reviewed and negative except as noted in H and P     ECOG: 1    Allergies:  No Known Allergies    Medications:   Current Outpatient Medications:     calcium carbonate (TUMS) 200 mg calcium (500 mg) chewable tablet, Chew 2 tablets (400 mg of elem calcium total) Three (3) times a day., Disp: , Rfl:     cholecalciferol, vitamin D3 25 mcg, 1,000 units,, 1,000 unit (25 mcg) tablet, Take 1 tablet (25 mcg total) by mouth., Disp: , Rfl:     cyanocobalamin, vitamin B-12, 2,000 mcg Tab, Take 2,000 mcg by mouth in the morning., Disp: 90 tablet, Rfl: 3    docusate sodium (COLACE) 50 MG capsule, Take 1 capsule (50 mg total) by mouth two (2) times a day., Disp: , Rfl:     loratadine (CLARITIN) 10 mg tablet, Take 1 tablet (10 mg total) by mouth daily., Disp: , Rfl:     metoPROLOL succinate (TOPROL-XL) 50 MG 24 hr tablet, Take 1 tablet (50 mg total) by mouth daily., Disp: 30 tablet, Rfl: 11    multivitamin with minerals tablet, Take 1 tablet by mouth daily., Disp: , Rfl:     omeprazole (PRILOSEC) 40 MG capsule, Take 1 capsule (40 mg total) by mouth daily., Disp: 90 capsule, Rfl: 0    ondansetron (ZOFRAN) 8 MG tablet, Take 1 tablet (8 mg total) by mouth every eight (8) hours as needed for nausea., Disp: 30 tablet, Rfl: 2    sildenafiL, pulm.hypertension, (REVATIO) 20 mg tablet, Take 1-5 tablets (20-100 mg total) by mouth daily as needed (for erectile function)., Disp: 90 tablet, Rfl: 3    tamsulosin (FLOMAX) 0.4 mg capsule, Take 1 capsule (0.4 mg total) by mouth daily., Disp: 90 capsule, Rfl: 0    valACYclovir (VALTREX) 500 MG tablet, Take 1 tablet (500 mg total) by mouth daily., Disp: 90 tablet, Rfl: 3    venetoclax (VENCLEXTA) 100 mg tablet, Take 4 tablets (400 mg total) by mouth daily for 21 days., Disp: 84 tablet, Rfl: 1  No current facility-administered medications for this visit.    Facility-Administered Medications Ordered in Other Visits:     DTaP-hepatitis B recombinant-IPV (PEDIARIX) 10 mcg-25Lf-25 mcg-10Lf/0.5 mL injection, , , ,     DTaP-hepatitis B recombinant-IPV (PEDIARIX) 10 mcg-25Lf-25 mcg-10Lf/0.5 mL injection, , , ,     haemophilus B polysac-tetanus toxoid (ActHIB) 10 mcg/0.5 mL injection, , , ,     influenza vaccine quad (FLUARIX, FLULAVAL, FLUZONE) (6 MOS & UP) 2020-21 ADS Med, , , ,     pneumococcal conjugate (13-valent) (PREVNAR-13) 0.5 mL vaccine, , , ,     pneumococcal conjugate (13-valent) (PREVNAR-13) 0.5 mL vaccine, , , ,     varicella-zoster gE-AS01B (PF) (SHINGRIX) 50 mcg/0.5 mL injection, , , ,     Medical History:  Past Medical History:   Diagnosis Date    Benign prostatic hyperplasia with lower urinary tract symptoms 11/27/2018    BPH (benign prostatic hyperplasia)     Diarrhea     Fatigue     Fractures     left arm    Glucose intolerance     May 2019: HbA1c 6.2.    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  Planning to build more apartments.       Family History:  Family History   Problem Relation Age of Onset    Dementia Father     No Known Problems Other        Objective:   Vitals:    01/31/23 1112   BP: 159/84   Pulse: 82   Resp: 15   Temp: 36.4 ??C (97.6 ??F)   SpO2: 99%       Physical Exam:  General: Resting, in no apparent distress  HEENT:  PERRL. No scleral icterus or conjunctival injection.   Heart:  RRR.  S1, S2.  No murmurs, gallops or rubs. Mild non-pitting edema noted BLE.   Lungs:  Breathing is unlabored, and patient is speaking full sentences with ease.  CTAB. No rales, ronchi or crackles.    Abdomen:  No distention or pain on palpation.  Bowel sounds are present.  No palpable masses.  Skin:  No rashes, petechiae or purpura. Grossly intact.  Musculoskeletal:  strength is equal bilaterally   Psychiatric:  Range of affect is appropriate.    Neurologic:  Alert and oriented x 4.  Steady gait with assistance of cane.    Test Results:  Lab on 01/31/2023   Component Date Value Ref Range Status    Sodium 01/31/2023 142  135 - 145 mmol/L Final    Potassium 01/31/2023 3.4  3.4 - 4.8 mmol/L Final    Chloride 01/31/2023 108 (H)  98 - 107 mmol/L Final    CO2 01/31/2023 29.0  20.0 - 31.0 mmol/L Final    Anion Gap 01/31/2023 5  5 - 14 mmol/L Final    BUN 01/31/2023 17  9 - 23 mg/dL Final    Creatinine 29/56/2130 0.90  0.73 - 1.18 mg/dL Final    BUN/Creatinine Ratio 01/31/2023 19   Final    eGFR CKD-EPI (2021) Male 01/31/2023 89  >=60 mL/min/1.25m2 Final    eGFR calculated with CKD-EPI 2021 equation in accordance with [employer] and [employer] of Nephrology Task Force recommendations.    Glucose 01/31/2023 108  70 - 179 mg/dL Final    Calcium 86/57/8469 8.5 (L)  8.7 - 10.4 mg/dL Final    Albumin 62/95/2841 3.9  3.4 - 5.0 g/dL Final    Total Protein 01/31/2023 6.6  5.7 - 8.2 g/dL Final    Total Bilirubin 01/31/2023 1.4 (H)  0.3 - 1.2 mg/dL Final    AST 32/44/0102 32  <=34 U/L Final    ALT 01/31/2023 43  10 - 49 U/L Final    Alkaline Phosphatase 01/31/2023 92  46 - 116 U/L Final    Magnesium 01/31/2023 1.6  1.6 - 2.6 mg/dL Final    Phosphorus 72/53/6644 3.1  2.4 - 5.1 mg/dL Final    LDH 03/47/4259 224  120 - 246 U/L Final    Uric Acid 01/31/2023 3.9  3.7 - 9.2 mg/dL Final    WBC 56/38/7564 1.0 (L)  3.6 - 11.2 10*9/L Final    RBC 01/31/2023 2.88 (L)  4.26 - 5.60 10*12/L Final    HGB 01/31/2023 10.0 (L)  12.9 - 16.5 g/dL Final    HCT 33/29/5188 28.6 (L)  39.0 - 48.0 % Final    MCV 01/31/2023 99.2 (H)  77.6 - 95.7 fL  Final    MCH 01/31/2023 34.7 (H)  25.9 - 32.4 pg Final    MCHC 01/31/2023 34.9  32.0 - 36.0 g/dL Final    RDW 40/98/1191 25.4 (H)  12.2 - 15.2 % Final    MPV 01/31/2023 7.3  6.8 - 10.7 fL Final    Platelet 01/31/2023 167  150 - 450 10*9/L Final    Neutrophils % 01/31/2023 9.6  % Final    Lymphocytes % 01/31/2023 83.0  % Final    Monocytes % 01/31/2023 6.9  % Final    Eosinophils % 01/31/2023 0.0  % Final    Basophils % 01/31/2023 0.5  % Final    Absolute Neutrophils 01/31/2023 0.1 (LL)  1.8 - 7.8 10*9/L Final    Absolute Lymphocytes 01/31/2023 0.9 (L)  1.1 - 3.6 10*9/L Final    Absolute Monocytes 01/31/2023 0.1 (L)  0.3 - 0.8 10*9/L Final    Absolute Eosinophils 01/31/2023 0.0  0.0 - 0.5 10*9/L Final    Absolute Basophils 01/31/2023 0.0  0.0 - 0.1 10*9/L Final    Anisocytosis 01/31/2023 Marked (A)  Not Present Final    Smear Review Comments 01/31/2023 See Comment (A)  Undefined Final    [id number] Slide Reviewed.     Poikilocytosis 01/31/2023 Moderate (A)  Not Present Final    Neutrophil Left Shift 01/31/2023 Present (A)  Not Present Final

## 2023-02-26 DIAGNOSIS — N400 Enlarged prostate without lower urinary tract symptoms: Principal | ICD-10-CM

## 2023-02-26 MED ORDER — TAMSULOSIN 0.4 MG CAPSULE
ORAL_CAPSULE | 0 refills | 0 days
Start: 2023-02-26 — End: ?

## 2023-02-27 NOTE — Unmapped (Signed)
Pt is requesting refill    Most recent clinic visit: 02/08/2023  Next clinic visit:  03/08/2023

## 2023-03-01 DIAGNOSIS — N400 Enlarged prostate without lower urinary tract symptoms: Principal | ICD-10-CM

## 2023-03-01 MED ORDER — CHOLESTYRAMINE (WITH SUGAR) 4 GRAM POWDER FOR SUSP IN A PACKET
PACK | Freq: Two times a day (BID) | ORAL | 5 refills | 30 days | Status: CP
Start: 2023-03-01 — End: ?

## 2023-03-01 MED ORDER — TAMSULOSIN 0.4 MG CAPSULE
ORAL_CAPSULE | Freq: Every day | ORAL | 0 refills | 90 days | Status: CP
Start: 2023-03-01 — End: ?

## 2023-03-07 NOTE — Unmapped (Signed)
[facility] Leukemia Clinic Visit Note     Patient Name: Timothy Porter  Patient Age: 77 y.o.  Encounter Date: 03/08/2023    Cancer Diagnosis: Adverse risk AML; Initial Dx 10/2022  Cancer status: MLF S  Treatment Regimen: First Line, AZA/VEN on BAML S12  Treatment Goal: Control  Comorbidities: History of multiple myeloma  Transplant: Discussed and declined     Assessment:  Timothy Porter is a 77 y.o. 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  He consented to [facility] S12 arm (ven x 28 days) and began treatment on 11/22/2022.  He is currently in MRD-neg CR by flow (last bmbx 01/23/23). He presents to consider C4.     Counts still not recovered. Will postpone.     We discussed transplant. He was not interested in pursuing at this time. He wanted to discuss with his son, though felt that he did now want to take the risk of all the deficits to his QoL for a small chance of cure.     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 Likely treatment related with prior exposure to melphalan also lenalidomide. On BAML S12.   - Await count recovery - Postpone C4 (7D of AZA, 21D of VEN)  - RTC in 1 week       Free Kappa Chain MM: Treatment hx: RVD x4 (02/19/19-) - ASCT, mel 140 mg/m2 (05/29/19; VGPR) - Len maintenance, [id number] (10/22/19-08/2022; CR, MRD-neg).   - Continue to follow with myeloma team Melrose Nakayama).      GERD: Continue omeprazole      Alternating constipation and diarrhea: Continue home cholestyramine      Psychosocial support: He demonstrates good coping and has good support to proceed with the management above  - Counseling given   - Consider ref to comprehensive cancer support program if needed     Patient-centered care/Shared decision-making:   We discussed the plan above at length. The patient actively contributed to the conversation. Specifically, his most important outcomes are:   - Living longer   - Maintaining his overall functional activity      Supportive Care Needs: We recommend based on the patient???s underlying diagnosis and treatment history the following supportive care:     1. Antimicrobial prophylaxis: We will revisit during our next appointment. Tentatively for AML (not in remission): Bacterial: Levofloxacin 500mg  PO daily (absolute neutrophils >/= 0.5 until absolute neutrophils >/= 0.5);   Fungal: AML fungal: Posaconazole 300mg  PO daily (absolute neutrophils >/= 0.5 until absolute neutrophils >/= 0.5) ;   Viral: Valacyclovir 500mg  PO daily (continuous). If posaconazole is to be used, venetoclax should be reduced to 100 mg a day.      2. Blood product support:  Leukoreduced blood products are required.  Irradiated blood products are preferred, but in case of urgent transfusion needs non-irradiated blood products may be used. In the outpatient setting our recommendations are as below:  - 1 unit pRBC for Hb < 8 or Hb < 9 and symptomatic  - 1 unit of platelets for platelets < 20    Coordination:   - RTC in 1 week to eval for count recovery, C4 start     [name], MD  Leukemia Program  Division of Hematology  [facility]        Hematology/Oncology History Overview Note   Referring/Local Oncologist:  [name]     Diagnosis:   Diagnosis  Date Value Ref Range Status   10/26/2022   Final    Bone marrow, left iliac, aspiration and biopsy  -  Hypercellular bone marrow (50%) involved by acute myeloid leukemia with MECOM rearrangement (23% blasts by manual aspirate differential and 10-20% CD34-positive blasts by immunohistochemistry)   -  Mild to focally moderate marrow fibrosis (MF-1 to 2)   -  Less than 5% plasma cells by manual aspirate differential count (see Comment)  -  See linked reports for associated Ancillary Studies.      This electronic signature is attestation that the pathologist personally reviewed the submitted material(s) and the final diagnosis reflects that evaluation.          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,XY[7]         Myeloid Mutational Panel:        Variants of Known/Likely Clinical Significance:   Gene Coding Predicted Protein Variant allele fraction   SRSF2 c.284C>G p.(Pro95Arg) 21.6 %       Treatment Timeline:  11/22/2022 Cycle 1:  BAML S12 azacitidine 75mg /m2 x 7 days + venetoclax 400mg  x 28 days  12/13/22: Bmbx - Limited, though no increase in blasts, MRD-neg by flow, poor specimen but no blasts (at <0.2%)   01/04/2023 Cycle 2: aza 75 x 7 + ven 400 x 28 days (2 week delay)  01/23/2023: BMbx: <5% blasts by CD34 immunohistochemistry, flow MRD negative  02/09/2023: Cycle 3: Aza 75 x 7 + ven 400 x 21       Multiple myeloma (CMS-HCC)   02/12/2019 Initial Diagnosis    Multiple myeloma (CMS-HCC)     05/28/2019 - 06/03/2019 Chemotherapy    BMT IP AUTO MELPHALAN  Melphalan 140 mg/m2 or 200 mg/m2 IV Day -1     10/22/2019 - 06/27/2022 Chemotherapy    STUDY [id number] IRB# [id number] LENALIDOMIDE (v. 11/04/18)  A Randomized Study of Daratumumab Plus Lenalidomide Versus Lenalidomide Alone as Maintenance Treatment in Patients with Newly Diagnosed Multiple Myeloma Who Are Minimal Residual Disease Positive After Frontline Autologous Stem Cell Transplant.     History of auto stem cell transplant (CMS-HCC) (Resolved)   08/05/2019 Initial Diagnosis    History of auto stem cell transplant (CMS-HCC)     10/22/2019 - 06/27/2022 Chemotherapy    STUDY [id number] IRB# [id number] LENALIDOMIDE (v. 11/04/18)  A Randomized Study of Daratumumab Plus Lenalidomide Versus Lenalidomide Alone as Maintenance Treatment in Patients with Newly Diagnosed Multiple Myeloma Who Are Minimal Residual Disease Positive After Frontline Autologous Stem Cell Transplant.     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 Continues to improve. No significant issues.  No fevers, chills, or infectious symptoms.     ROS reviewed and negative except as noted in H and P     ECOG: 1    Allergies:  No Known Allergies    Medications:   Current Outpatient Medications:     calcium carbonate (TUMS) 200 mg calcium (500 mg) chewable tablet, Chew 2 tablets (400 mg of elem calcium total) Three (3) times a day., Disp: , Rfl:     cholecalciferol, vitamin D3 25 mcg, 1,000 units,, 1,000 unit (25 mcg) tablet, Take 1 tablet (25 mcg total) by mouth., Disp: , Rfl:  cholestyramine (QUESTRAN) 4 gram packet, Take 1 packet by mouth in the morning and 1 packet in the evening. Take with meals. Mix 4 g (1 packet) into 2-3 ounces of fluid and take twice a day.., Disp: 60 packet, Rfl: 5    cyanocobalamin, vitamin B-12, 2,000 mcg Tab, Take 2,000 mcg by mouth in the morning., Disp: 90 tablet, Rfl: 3    docusate sodium (COLACE) 50 MG capsule, Take 1 capsule (50 mg total) by mouth two (2) times a day., Disp: , Rfl:     loratadine (CLARITIN) 10 mg tablet, Take 1 tablet (10 mg total) by mouth daily., Disp: , Rfl:     metoPROLOL succinate (TOPROL-XL) 50 MG 24 hr tablet, Take 1 tablet (50 mg total) by mouth daily., Disp: 30 tablet, Rfl: 11    multivitamin with minerals tablet, Take 1 tablet by mouth daily., Disp: , Rfl:     omeprazole (PRILOSEC) 40 MG capsule, TAKE 1 CAPSULE(40 MG) BY MOUTH DAILY, Disp: 90 capsule, Rfl: 0    ondansetron (ZOFRAN) 8 MG tablet, Take 1 tablet (8 mg total) by mouth every eight (8) hours as needed for nausea., Disp: 30 tablet, Rfl: 2    sildenafiL, pulm.hypertension, (REVATIO) 20 mg tablet, Take 1-5 tablets (20-100 mg total) by mouth daily as needed (for erectile function)., Disp: 90 tablet, Rfl: 3    tamsulosin (FLOMAX) 0.4 mg capsule, Take 1 capsule (0.4 mg total) by mouth daily., Disp: 90 capsule, Rfl: 0    valACYclovir (VALTREX) 500 MG tablet, Take 1 tablet (500 mg total) by mouth daily., Disp: 90 tablet, Rfl: 3    venetoclax (VENCLEXTA) 100 mg tablet, Take 4 tablets (400 mg total) by mouth daily for 21 days., Disp: 84 tablet, Rfl: 1  No current facility-administered medications for this visit.    Facility-Administered Medications Ordered in Other Visits:     DTaP-hepatitis B recombinant-IPV (PEDIARIX) 10 mcg-25Lf-25 mcg-10Lf/0.5 mL injection, , , ,     DTaP-hepatitis B recombinant-IPV (PEDIARIX) 10 mcg-25Lf-25 mcg-10Lf/0.5 mL injection, , , ,     haemophilus B polysac-tetanus toxoid (ActHIB) 10 mcg/0.5 mL injection, , , ,     influenza vaccine quad (FLUARIX, FLULAVAL, FLUZONE) (6 MOS & UP) 2020-21 ADS Med, , , ,     pneumococcal conjugate (13-valent) (PREVNAR-13) 0.5 mL vaccine, , , ,     pneumococcal conjugate (13-valent) (PREVNAR-13) 0.5 mL vaccine, , , ,     varicella-zoster gE-AS01B (PF) (SHINGRIX) 50 mcg/0.5 mL injection, , , ,     Medical History:  Past Medical History:   Diagnosis Date    Benign prostatic hyperplasia with lower urinary tract symptoms 11/27/2018    BPH (benign prostatic hyperplasia)     Diarrhea     Fatigue     Fractures     left arm    Glucose intolerance     May 2019: HbA1c 6.2.    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  Planning to build more apartments.       Family History:  Family History   Problem Relation Age of Onset    Dementia Father  No Known Problems Other        Objective:   There were no vitals filed for this visit.      03/08/23 02/08/23   BP 168/88   Heart Rate 80   Resp 20   Temp 36.8 ??C (98.2 ??F)   Temp Source Tympanic   SpO2 98 %       Physical Exam:  GENERAL: Well-appearing white man unaccompanied   HEART: Normal color, not excessive pallor.  CHEST/LUNG: Normal work of breathing. No dyspnea with conversation.  ABDOMEN: No obvious distention.    EXTREMITIES: No edema, cyanosis or clubbing.   SKIN: No noticable rash or petechiae.   NEURO EXAM: Grossly intact.       Test Results:  Discussed bone marrow biopsy, CBC and chemistry with the patient

## 2023-03-08 ENCOUNTER — Ambulatory Visit: Admit: 2023-03-08 | Discharge: 2023-03-08 | Payer: MEDICARE

## 2023-03-08 ENCOUNTER — Institutional Professional Consult (permissible substitution): Admit: 2023-03-08 | Discharge: 2023-03-08 | Payer: MEDICARE

## 2023-03-08 ENCOUNTER — Ambulatory Visit: Admit: 2023-03-08 | Discharge: 2023-03-08 | Payer: MEDICARE | Attending: Hematology | Primary: Hematology

## 2023-03-08 DIAGNOSIS — C9000 Multiple myeloma not having achieved remission: Principal | ICD-10-CM

## 2023-03-08 DIAGNOSIS — R197 Diarrhea, unspecified: Principal | ICD-10-CM

## 2023-03-08 DIAGNOSIS — K219 Gastro-esophageal reflux disease without esophagitis: Principal | ICD-10-CM

## 2023-03-08 DIAGNOSIS — C9201 Acute myeloblastic leukemia, in remission: Principal | ICD-10-CM

## 2023-03-08 LAB — COMPREHENSIVE METABOLIC PANEL
ALBUMIN: 3.9 g/dL (ref 3.4–5.0)
ALKALINE PHOSPHATASE: 92 U/L (ref 46–116)
ALT (SGPT): 51 U/L — ABNORMAL HIGH (ref 10–49)
ANION GAP: 7 mmol/L (ref 5–14)
AST (SGOT): 35 U/L — ABNORMAL HIGH (ref <=34)
BILIRUBIN TOTAL: 0.6 mg/dL (ref 0.3–1.2)
BLOOD UREA NITROGEN: 18 mg/dL (ref 9–23)
BUN / CREAT RATIO: 23
CALCIUM: 8.6 mg/dL — ABNORMAL LOW (ref 8.7–10.4)
CHLORIDE: 107 mmol/L (ref 98–107)
CO2: 26 mmol/L (ref 20.0–31.0)
CREATININE: 0.8 mg/dL
EGFR CKD-EPI (2021) MALE: >90 mL/min/{1.73_m2} (ref >=60)
GLUCOSE RANDOM: 131 mg/dL (ref 70–179)
POTASSIUM: 4 mmol/L (ref 3.4–4.8)
PROTEIN TOTAL: 6.5 g/dL (ref 5.7–8.2)
SODIUM: 140 mmol/L (ref 135–145)

## 2023-03-08 LAB — CBC W/ AUTO DIFF
BASOPHILS ABSOLUTE COUNT: 0 10*9/L (ref 0.0–0.1)
BASOPHILS RELATIVE PERCENT: 2 %
EOSINOPHILS ABSOLUTE COUNT: 0 10*9/L (ref 0.0–0.5)
EOSINOPHILS RELATIVE PERCENT: 0 %
HEMATOCRIT: 33.3 % — ABNORMAL LOW (ref 39.0–48.0)
HEMOGLOBIN: 11.9 g/dL — ABNORMAL LOW (ref 12.9–16.5)
LYMPHOCYTES ABSOLUTE COUNT: 1.2 10*9/L (ref 1.1–3.6)
LYMPHOCYTES RELATIVE PERCENT: 72.8 %
MEAN CORPUSCULAR HEMOGLOBIN CONC: 35.6 g/dL (ref 32.0–36.0)
MEAN CORPUSCULAR HEMOGLOBIN: 36.9 pg — ABNORMAL HIGH (ref 25.9–32.4)
MEAN CORPUSCULAR VOLUME: 103.8 fL — ABNORMAL HIGH (ref 77.6–95.7)
MEAN PLATELET VOLUME: 6.6 fL — ABNORMAL LOW (ref 6.8–10.7)
MONOCYTES ABSOLUTE COUNT: 0.1 10*9/L — ABNORMAL LOW (ref 0.3–0.8)
MONOCYTES RELATIVE PERCENT: 7.4 %
NEUTROPHILS ABSOLUTE COUNT: 0.3 10*9/L — CL (ref 1.8–7.8)
NEUTROPHILS RELATIVE PERCENT: 17.8 %
PLATELET COUNT: 264 10*9/L (ref 150–450)
RED BLOOD CELL COUNT: 3.21 10*12/L — ABNORMAL LOW (ref 4.26–5.60)
RED CELL DISTRIBUTION WIDTH: 16.3 % — ABNORMAL HIGH (ref 12.2–15.2)
WBC ADJUSTED: 1.6 10*9/L — ABNORMAL LOW (ref 3.6–11.2)

## 2023-03-08 MED ORDER — OMEPRAZOLE 40 MG CAPSULE,DELAYED RELEASE
ORAL_CAPSULE | ORAL | 0 refills | 0 days | Status: CP
Start: 2023-03-08 — End: ?

## 2023-03-08 NOTE — Unmapped (Signed)
Pt is requesting refill    Most recent clinic visit: 03/08/2023  Next clinic visit:  03/15/2023

## 2023-03-08 NOTE — Unmapped (Signed)
Nice to see you today.     We discussed the following:      1. 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 Well done. We discussed the possibility of transplant, please discuss with your family. For today:     - Do not start the venetoclax   - We will postpone your treatment for 1 week   - No transfusions   - Start the levaquin (levofloxacin)    - Continue the valtrex     We will see you back in 1 week.   Labs       [name], MD  Leukemia Program       Nurse Navigator (non-clinical trial patients): Elicia Lamp, RN        Tel. [phone]       Fax. [phone]  Toll-free appointments: [phone]  Scheduling assistance: [phone]  After hours/weekends: [phone] (ask for adult hematology/oncology on-call)      Lab Results   Component Value Date    WBC 1.6 (L) 03/08/2023    HGB 11.9 (L) 03/08/2023    HCT 33.3 (L) 03/08/2023    PLT 264 03/08/2023       Lab Results   Component Value Date    NA 141 02/22/2023    NA 143 02/22/2023    K 3.2 (L) 02/22/2023    K 3.3 (L) 02/22/2023    CL 106 02/22/2023    CL 108 (H) 02/22/2023    CO2 28.0 02/22/2023    CO2 28.0 02/22/2023    BUN 18 02/22/2023    BUN 17 02/22/2023    CREATININE 0.82 02/22/2023    CREATININE 0.84 02/22/2023    GLU 109 02/22/2023    GLU 113 02/22/2023    CALCIUM 8.8 02/22/2023    CALCIUM 8.6 (L) 02/22/2023    MG 1.6 02/22/2023    PHOS 3.1 01/31/2023       Lab Results   Component Value Date    BILITOT 1.1 02/22/2023    BILITOT 1.2 02/22/2023    BILIDIR <0.10 07/03/2019    PROT 6.4 02/22/2023    PROT 6.4 02/22/2023    ALBUMIN 3.8 02/22/2023    ALBUMIN 3.9 02/22/2023    ALT 42 02/22/2023    ALT 41 02/22/2023    AST 32 02/22/2023    AST 30 02/22/2023    ALKPHOS 74 02/22/2023    ALKPHOS 77 02/22/2023    GGT 64 06/28/2019       Lab Results   Component Value Date    INR 0.99 11/17/2022    APTT 26.2 11/17/2022

## 2023-03-12 MED ORDER — TAMSULOSIN 0.4 MG CAPSULE
ORAL_CAPSULE | 0 refills | 0 days
Start: 2023-03-12 — End: ?

## 2023-03-13 NOTE — Unmapped (Signed)
Timothy Porter has been contacted in regards to their refill of Venclexta. At this time, they have declined refill due to "medication being on hold. Refill assessment call date has been updated per the patient's request.

## 2023-03-15 ENCOUNTER — Ambulatory Visit: Admit: 2023-03-15 | Discharge: 2023-03-15 | Payer: MEDICARE

## 2023-03-15 ENCOUNTER — Encounter: Admit: 2023-03-15 | Discharge: 2023-03-15 | Payer: MEDICARE | Attending: Hematology | Primary: Hematology

## 2023-03-15 ENCOUNTER — Other Ambulatory Visit: Admit: 2023-03-15 | Discharge: 2023-03-15 | Payer: MEDICARE

## 2023-03-15 LAB — CBC W/ AUTO DIFF
BASOPHILS ABSOLUTE COUNT: 0 10*9/L (ref 0.0–0.1)
BASOPHILS RELATIVE PERCENT: 0.3 %
EOSINOPHILS ABSOLUTE COUNT: 0 10*9/L (ref 0.0–0.5)
EOSINOPHILS RELATIVE PERCENT: 0 %
HEMATOCRIT: 34.9 % — ABNORMAL LOW (ref 39.0–48.0)
HEMOGLOBIN: 12.4 g/dL — ABNORMAL LOW (ref 12.9–16.5)
LYMPHOCYTES ABSOLUTE COUNT: 1.5 10*9/L (ref 1.1–3.6)
LYMPHOCYTES RELATIVE PERCENT: 49.6 %
MEAN CORPUSCULAR HEMOGLOBIN CONC: 35.5 g/dL (ref 32.0–36.0)
MEAN CORPUSCULAR HEMOGLOBIN: 36.9 pg — ABNORMAL HIGH (ref 25.9–32.4)
MEAN CORPUSCULAR VOLUME: 103.8 fL — ABNORMAL HIGH (ref 77.6–95.7)
MEAN PLATELET VOLUME: 6.8 fL (ref 6.8–10.7)
MONOCYTES ABSOLUTE COUNT: 0.6 10*9/L (ref 0.3–0.8)
MONOCYTES RELATIVE PERCENT: 21.1 %
NEUTROPHILS ABSOLUTE COUNT: 0.9 10*9/L — ABNORMAL LOW (ref 1.8–7.8)
NEUTROPHILS RELATIVE PERCENT: 29 %
PLATELET COUNT: 164 10*9/L (ref 150–450)
RED BLOOD CELL COUNT: 3.36 10*12/L — ABNORMAL LOW (ref 4.26–5.60)
RED CELL DISTRIBUTION WIDTH: 15.9 % — ABNORMAL HIGH (ref 12.2–15.2)
WBC ADJUSTED: 3 10*9/L — ABNORMAL LOW (ref 3.6–11.2)

## 2023-03-15 LAB — COMPREHENSIVE METABOLIC PANEL
ALBUMIN: 3.9 g/dL (ref 3.4–5.0)
ALKALINE PHOSPHATASE: 87 U/L (ref 46–116)
ALT (SGPT): 42 U/L (ref 10–49)
ANION GAP: 7 mmol/L (ref 5–14)
AST (SGOT): 29 U/L (ref <=34)
BILIRUBIN TOTAL: 0.7 mg/dL (ref 0.3–1.2)
BLOOD UREA NITROGEN: 21 mg/dL (ref 9–23)
BUN / CREAT RATIO: 23
CALCIUM: 9.3 mg/dL (ref 8.7–10.4)
CHLORIDE: 107 mmol/L (ref 98–107)
CO2: 26 mmol/L (ref 20.0–31.0)
CREATININE: 0.91 mg/dL
EGFR CKD-EPI (2021) MALE: 88 mL/min/{1.73_m2} (ref >=60)
GLUCOSE RANDOM: 119 mg/dL (ref 70–179)
POTASSIUM: 4 mmol/L (ref 3.4–4.8)
PROTEIN TOTAL: 6.6 g/dL (ref 5.7–8.2)
SODIUM: 140 mmol/L (ref 135–145)

## 2023-03-15 LAB — LACTATE DEHYDROGENASE: LACTATE DEHYDROGENASE: 205 U/L (ref 120–246)

## 2023-03-15 LAB — URIC ACID: URIC ACID: 4.3 mg/dL

## 2023-03-15 LAB — MAGNESIUM: MAGNESIUM: 2 mg/dL (ref 1.6–2.6)

## 2023-03-15 NOTE — Unmapped (Incomplete)
[facility] Leukemia Clinic Visit Note     Patient Name: Timothy Porter  Patient Age: 77 y.o.  Encounter Date: 03/15/2023    Primary Care Provider:  Pcp, None Per Patient    Referring Physician:  Doreatha Lew, MD  [address]  [place],  [place] [address]    Reason for visit:  AML     Assessment:  Timothy Porter is a 77 y.o. 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  He enrolled on BAML S12 arm (ven x 28 days) and began treatment on 11/22/2022. After 1 cycle of treatment he was found to be in CR without MRD.  He has completed 3 cycles of treatment and presents to clinic today for follow up and consideration of cycle 4.     Timothy Porter is tolerating treatment on aza/ven quite well without complications or toxicities, outside of delayed count recovery.  He was due to start cycle 4 last week, but was delayed to allow for hematologic recovery.  On lab review today his ANC is noted to be 0.9 with plt 164.  I will plan to continue to hold his treatment and have him return in 4 days.  Once ANC >/= 1.0 and plt >/= 50 we will plan to proceed with cycle 4 of dose reduced azacitidine 75mg /m2 x 5 days + venetoclax 400mg  x 21 days.  Now that his ANC >0.5 will have him stop levofloxacin.  He remains transfusion independent, therefore he does not require frequent lab monitoring.  He should continue valtrex for ppx.     Timothy Porter has tolerated treatment on aza/ven well without toxicities or complications, outside of delayed count recovery. His treatment was delayed last week to allow for count recovery.  Today ANC is 0.9 and plt 151.  We will plan to proceed with cycle 3 today with azacitidine 75mg /m2 x 7 days + venetoclax 400mg  x 21 days.  He can stop levaquin at this time.  Continue valtrex.  He remains transfusion independent, but will plan on a mid-cycle lab check.  Will plan for him to RTC in 4 week for consideration for cycle 4.     Free Kappa Chain MM:  He will continue follow up with MM team and has upcoming appt with [name] on 02/13/2023         Diarrhea: Chronic and long standing.  Continue with cholestyramine, but recommend adding imodium to bowel regimen      - diarrhea is okay because he's doubled up on his cholesteryme.  And now his stools are formed and he's able to do more things now.   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   - stop levaquin  - continue valtrex  - mid-cycle lab check  - RTC in 4 week for follow up and consideration of cycle 4      [name] was available    [name], AGNP-BC  Leukemia Research Nurse Practitioner  Hematology/Oncology Division  [facility]  03/15/2023    I personally spent 40 minutes face-to-face and non-face-to-face in the care of this patient, which includes all pre, intra, and post visit time on the date of service.  All documented time was specific to the E/M visit and does not include any procedures that may have been performed.    Hematology/Oncology History Overview Note   Referring/Local Oncologist:  [name]     Diagnosis:   Diagnosis   Date Value Ref Range Status   10/26/2022   Final Bone marrow, left iliac, aspiration and biopsy  -  Hypercellular bone marrow (50%) involved by acute myeloid leukemia with MECOM rearrangement (23% blasts by manual aspirate differential and 10-20% CD34-positive blasts by immunohistochemistry)   -  Mild to focally moderate marrow fibrosis (MF-1 to 2)   -  Less than 5% plasma cells by manual aspirate differential count (see Comment)  -  See linked reports for associated Ancillary Studies.      This electronic signature is attestation that the pathologist personally reviewed the submitted material(s) and the final diagnosis reflects that evaluation.          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,XY[7]         Myeloid Mutational Panel:        Variants of Known/Likely Clinical Significance:   Gene Coding Predicted Protein Variant allele fraction   SRSF2 c.284C>G p.(Pro95Arg) 21.6 %       Treatment Timeline:  11/22/2022 Cycle 1:  BAML S12 azacitidine 75mg /m2 x 7 days + venetoclax 400mg  x 28 days  12/13/22: Bmbx - Limited, though no increase in blasts, MRD-neg by flow, poor specimen but no blasts (at <0.2%)   01/04/2023 Cycle 2: aza 75 x 7 + ven 400 x 28 days (2 week delay)  01/23/2023: BMbx: <5% blasts by CD34 immunohistochemistry, flow MRD negative  02/09/2023: Cycle 3: Aza 75 x 7 + ven 400 x 21       Multiple myeloma (CMS-HCC)   02/12/2019 Initial Diagnosis    Multiple myeloma (CMS-HCC)     05/28/2019 - 06/03/2019 Chemotherapy    BMT IP AUTO MELPHALAN  Melphalan 140 mg/m2 or 200 mg/m2 IV Day -1     10/22/2019 - 06/27/2022 Chemotherapy    STUDY [id number] IRB# [id number] LENALIDOMIDE (v. 11/04/18)  A Randomized Study of Daratumumab Plus Lenalidomide Versus Lenalidomide Alone as Maintenance Treatment in Patients with Newly Diagnosed Multiple Myeloma Who Are Minimal Residual Disease Positive After Frontline Autologous Stem Cell Transplant.     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 Illes states he has been feeling well.  His energy is good.  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  His diarrhea is improved with increasing cholestyramine and his  stools are now more formed.  He is also not experiencing the urgency that he was.  He reports a good appetite, but he's not eating as much and now eating more smaller meals.  No new cough or sob.  No new fevers/chills.     Otherwise, he denies new constitutional symptoms such as anorexia, weight loss, fatigue, night sweats or unexplained fevers.  Furthermore, he denies unexplained bleeding or bruising, recurrent or unexplained intercurrent infections, dyspnea on exertion, lightheadedness, palpitations or chest pain.  There have been no new or unexplained pains or self-identified masses, swelling or enlarged lymph nodes.    ROS reviewed and negative except as noted in H and P     ECOG: 1    Allergies:  No Known Allergies    Medications:   Current Outpatient Medications:     calcium carbonate (TUMS) 200 mg calcium (500 mg) chewable tablet, Chew 2 tablets (400 mg of elem calcium total) Three (3) times a day., Disp: , Rfl:     cholecalciferol, vitamin D3 25 mcg, 1,000 units,, 1,000 unit (25 mcg) tablet, Take 1 tablet (25 mcg total) by mouth., Disp: , Rfl:     cholestyramine (QUESTRAN) 4 gram packet, Take 1 packet by mouth in the morning and 1 packet in the evening. Take with meals. Mix 4 g (1 packet) into 2-3 ounces of fluid and take twice a day.., Disp: 60 packet, Rfl: 5    cyanocobalamin, vitamin B-12, 2,000 mcg Tab, Take 2,000 mcg by mouth in the morning., Disp: 90 tablet, Rfl: 3    docusate sodium (COLACE) 50 MG capsule, Take 1 capsule (50 mg total) by mouth two (2) times a day., Disp: , Rfl:     loratadine (CLARITIN) 10 mg tablet, Take 1 tablet (10 mg total) by mouth daily., Mix 4 g (1 packet) into 2-3 ounces of fluid and take twice a day.., Disp: 60 packet, Rfl: 5    cyanocobalamin, vitamin B-12, 2,000 mcg Tab, Take 2,000 mcg by mouth in the morning., Disp: 90 tablet, Rfl: 3    docusate sodium (COLACE) 50 MG capsule, Take 1 capsule (50 mg total) by mouth two (2) times a day., Disp: , Rfl:     loratadine (CLARITIN) 10 mg tablet, Take 1 tablet (10 mg total) by mouth daily., Disp: , Rfl:     metoPROLOL succinate (TOPROL-XL) 50 MG 24 hr tablet, Take 1 tablet (50 mg total) by mouth daily., Disp: 30 tablet, Rfl: 11    multivitamin with minerals tablet, Take 1 tablet by mouth daily., Disp: , Rfl:     omeprazole (PRILOSEC) 40 MG capsule, TAKE 1 CAPSULE(40 MG) BY MOUTH DAILY, Disp: 90 capsule, Rfl: 0    ondansetron (ZOFRAN) 8 MG tablet, Take 1 tablet (8 mg total) by mouth every eight (8) hours as needed for nausea., Disp: 30 tablet, Rfl: 2    sildenafiL, pulm.hypertension, (REVATIO) 20 mg tablet, Take 1-5 tablets (20-100 mg total) by mouth daily as needed (for erectile function)., Disp: 90 tablet, Rfl: 3    tamsulosin (FLOMAX) 0.4 mg capsule, Take 1 capsule (0.4 mg total) by mouth daily., Disp: 90 capsule, Rfl: 0    valACYclovir (VALTREX) 500 MG tablet, Take 1 tablet (500 mg total) by mouth daily., Disp: 90 tablet, Rfl: 3    venetoclax (VENCLEXTA) 100 mg tablet, Take 4 tablets (400 mg total) by mouth daily for 21 days., Disp: 84 tablet, Rfl: 1  No current facility-administered medications for this visit.    Facility-Administered Medications Ordered  in Other Visits:     DTaP-hepatitis B recombinant-IPV (PEDIARIX) 10 mcg-25Lf-25 mcg-10Lf/0.5 mL injection, , , ,     DTaP-hepatitis B recombinant-IPV (PEDIARIX) 10 mcg-25Lf-25 mcg-10Lf/0.5 mL injection, , , ,     haemophilus B polysac-tetanus toxoid (ActHIB) 10 mcg/0.5 mL injection, , , ,     influenza vaccine quad (FLUARIX, FLULAVAL, FLUZONE) (6 MOS & UP) 2020-21 ADS Med, , , ,     pneumococcal conjugate (13-valent) (PREVNAR-13) 0.5 mL vaccine, , , May 2019: HbA1c 6.2.    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 CMV negative 04/07/20.     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  Planning to build more apartments.       Family History:  Family History   Problem Relation Age of Onset    Dementia Father     No Known Problems Other        Objective:   Vitals:    03/15/23 0818   BP: 163/95   Pulse: 77   Resp: 18   Temp: 36.3 ??C (97.4 ??F)   SpO2: 98%       Physical Exam:  General: Resting, in no apparent distress  HEENT:  PERRL. No scleral icterus or conjunctival injection.   Heart:  RRR.  S1, S2.  No murmurs, gallops or rubs. Mild non-pitting edema noted BLE.   Lungs:  Breathing is unlabored, and patient is speaking full sentences with ease.  CTAB. No rales, ronchi or crackles.    Abdomen:  No distention or pain on palpation.  Bowel sounds are present.  No palpable masses.  Skin:  No rashes, petechiae or purpura. Grossly intact.  Musculoskeletal:  strength is equal bilaterally   Psychiatric:  Range of affect is appropriate.    Neurologic:  Alert and oriented x 4.  Steady gait with assistance of cane.    Test Results:  Lab on 03/15/2023   Component Date Value Ref Range Status    LDH 03/15/2023 205  120 - 246 U/L Final    Uric Acid 03/15/2023 4.3  3.7 - 9.2 mg/dL Final    Magnesium 16/08/9603 2.0  1.6 - 2.6 mg/dL Final    ABO Grouping 54/07/8118 A POS   Final    Antibody Screen 03/15/2023 NEG   Final affect is appropriate.    Neurologic:  Alert and oriented x 4.  Steady gait with assistance of cane.    Test Results:  Lab on 03/15/2023   Component Date Value Ref Range Status    LDH 03/15/2023 205  120 - 246 U/L Final    Uric Acid 03/15/2023 4.3  3.7 - 9.2 mg/dL Final    Magnesium 14/78/2956 2.0  1.6 - 2.6 mg/dL Final    ABO Grouping 21/30/8657 A POS   Final    Antibody Screen 03/15/2023 NEG   Final    Sodium 03/15/2023  140  135 - 145 mmol/L Final    Potassium 03/15/2023 4.0  3.4 - 4.8 mmol/L Final    Chloride 03/15/2023 107  98 - 107 mmol/L Final    CO2 03/15/2023 26.0  20.0 - 31.0 mmol/L Final    Anion Gap 03/15/2023 7  5 - 14 mmol/L Final    BUN 03/15/2023 21  9 - 23 mg/dL Final    Creatinine 09/81/1914 0.91  0.73 - 1.18 mg/dL Final    BUN/Creatinine Ratio 03/15/2023 23   Final    eGFR CKD-EPI (2021) Male 03/15/2023 88  >=60 mL/min/1.83m2 Final    eGFR calculated with CKD-EPI 2021 equation in accordance with [employer] and [employer] of Nephrology Task Force recommendations.    Glucose 03/15/2023 119  70 - 179 mg/dL Final    Calcium 78/29/5621 9.3  8.7 - 10.4 mg/dL Final    Albumin 30/86/5784 3.9  3.4 - 5.0 g/dL Final    Total Protein 03/15/2023 6.6  5.7 - 8.2 g/dL Final    Total Bilirubin 03/15/2023 0.7  0.3 - 1.2 mg/dL Final    AST 69/62/9528 29  <=34 U/L Final    ALT 03/15/2023 42  10 - 49 U/L Final    Alkaline Phosphatase 03/15/2023 87  46 - 116 U/L Final    WBC 03/15/2023 3.0 (L)  3.6 - 11.2 10*9/L Final    RBC 03/15/2023 3.36 (L)  4.26 - 5.60 10*12/L Final    HGB 03/15/2023 12.4 (L)  12.9 - 16.5 g/dL Final    HCT 41/32/4401 34.9 (L)  39.0 - 48.0 % Final    MCV 03/15/2023 103.8 (H)  77.6 - 95.7 fL Final    MCH 03/15/2023 36.9 (H)  25.9 - 32.4 pg Final    MCHC 03/15/2023 35.5  32.0 - 36.0 g/dL Final    RDW 02/72/5366 15.9 (H)  12.2 - 15.2 % Final    MPV 03/15/2023 6.8  6.8 - 10.7 fL Final    Platelet 03/15/2023 164  150 - 450 10*9/L Final    Neutrophils % 03/15/2023 29.0  % Final Lymphocytes % 03/15/2023 49.6  % Final    Monocytes % 03/15/2023 21.1  % Final    Eosinophils % 03/15/2023 0.0  % Final    Basophils % 03/15/2023 0.3  % Final    Absolute Neutrophils 03/15/2023 0.9 (L)  1.8 - 7.8 10*9/L Final    Absolute Lymphocytes 03/15/2023 1.5  1.1 - 3.6 10*9/L Final    Absolute Monocytes 03/15/2023 0.6  0.3 - 0.8 10*9/L Final    Absolute Eosinophils 03/15/2023 0.0  0.0 - 0.5 10*9/L Final    Absolute Basophils 03/15/2023 0.0  0.0 - 0.1 10*9/L Final    Macrocytosis 03/15/2023 Slight (A)  Not Present Final

## 2023-03-15 NOTE — Unmapped (Signed)
We are still going to have to wait a little longer for your ANC to recover.  It is 0.9 and we need it to be 1.0 to start your next cycle.     So let's have you come back on Monday 4/22 to try to start the next cycle.  Because we are having to delay the start of your next cycle we will now dose reduce your azacitidine injections down to 5 days instead of 7.  I will work on getting the appointments scheduled.     I would like for you to stop the levofloxacin now that your ANC is >0.5    Lab on 03/15/2023   Component Date Value Ref Range Status    LDH 03/15/2023 205  120 - 246 U/L Final    Uric Acid 03/15/2023 4.3  3.7 - 9.2 mg/dL Final    Magnesium 16/08/9603 2.0  1.6 - 2.6 mg/dL Final    Sodium 54/07/8118 140  135 - 145 mmol/L Final    Potassium 03/15/2023 4.0  3.4 - 4.8 mmol/L Final    Chloride 03/15/2023 107  98 - 107 mmol/L Final    CO2 03/15/2023 26.0  20.0 - 31.0 mmol/L Final    Anion Gap 03/15/2023 7  5 - 14 mmol/L Final    BUN 03/15/2023 21  9 - 23 mg/dL Final    Creatinine 14/78/2956 0.91  0.73 - 1.18 mg/dL Final    BUN/Creatinine Ratio 03/15/2023 23   Final    eGFR CKD-EPI (2021) Male 03/15/2023 88  >=60 mL/min/1.75m2 Final    eGFR calculated with CKD-EPI 2021 equation in accordance with [employer] and [employer] of Nephrology Task Force recommendations.    Glucose 03/15/2023 119  70 - 179 mg/dL Final    Calcium 21/30/8657 9.3  8.7 - 10.4 mg/dL Final    Albumin 84/69/6295 3.9  3.4 - 5.0 g/dL Final    Total Protein 03/15/2023 6.6  5.7 - 8.2 g/dL Final    Total Bilirubin 03/15/2023 0.7  0.3 - 1.2 mg/dL Final    AST 28/41/3244 29  <=34 U/L Final    ALT 03/15/2023 42  10 - 49 U/L Final    Alkaline Phosphatase 03/15/2023 87  46 - 116 U/L Final    WBC 03/15/2023 3.0 (L)  3.6 - 11.2 10*9/L Final    RBC 03/15/2023 3.36 (L)  4.26 - 5.60 10*12/L Final    HGB 03/15/2023 12.4 (L)  12.9 - 16.5 g/dL Final    HCT 11/29/7251 34.9 (L)  39.0 - 48.0 % Final    MCV 03/15/2023 103.8 (H)  77.6 - 95.7 fL Final    MCH 03/15/2023 36.9 (H)  25.9 - 32.4 pg Final    MCHC 03/15/2023 35.5  32.0 - 36.0 g/dL Final    RDW 66/44/0347 15.9 (H)  12.2 - 15.2 % Final    MPV 03/15/2023 6.8  6.8 - 10.7 fL Final    Platelet 03/15/2023 164  150 - 450 10*9/L Final    Neutrophils % 03/15/2023 29.0  % Final    Lymphocytes % 03/15/2023 49.6  % Final    Monocytes % 03/15/2023 21.1  % Final    Eosinophils % 03/15/2023 0.0  % Final    Basophils % 03/15/2023 0.3  % Final    Absolute Neutrophils 03/15/2023 0.9 (L)  1.8 - 7.8 10*9/L Final    Absolute Lymphocytes 03/15/2023 1.5  1.1 - 3.6 10*9/L Final    Absolute Monocytes 03/15/2023 0.6  0.3 -  0.8 10*9/L Final    Absolute Eosinophils 03/15/2023 0.0  0.0 - 0.5 10*9/L Final    Absolute Basophils 03/15/2023 0.0  0.0 - 0.1 10*9/L Final    Macrocytosis 03/15/2023 Slight (A)  Not Present Final

## 2023-03-15 NOTE — Unmapped (Unsigned)
Patient educated on various topics, clinical references attached to patient's MyChart and AVS. Time spent 3 minutes.

## 2023-03-19 ENCOUNTER — Institutional Professional Consult (permissible substitution): Admit: 2023-03-19 | Discharge: 2023-03-20 | Payer: MEDICARE

## 2023-03-19 ENCOUNTER — Ambulatory Visit: Admit: 2023-03-19 | Discharge: 2023-03-20 | Payer: MEDICARE

## 2023-03-19 DIAGNOSIS — C9201 Acute myeloblastic leukemia, in remission: Principal | ICD-10-CM

## 2023-03-19 DIAGNOSIS — C9200 Acute myeloblastic leukemia, not having achieved remission: Principal | ICD-10-CM

## 2023-03-19 LAB — CBC W/ AUTO DIFF
BASOPHILS ABSOLUTE COUNT: 0 10*9/L (ref 0.0–0.1)
BASOPHILS RELATIVE PERCENT: 0.3 %
EOSINOPHILS ABSOLUTE COUNT: 0 10*9/L (ref 0.0–0.5)
EOSINOPHILS RELATIVE PERCENT: 0 %
HEMATOCRIT: 36.1 % — ABNORMAL LOW (ref 39.0–48.0)
HEMOGLOBIN: 12.5 g/dL — ABNORMAL LOW (ref 12.9–16.5)
LYMPHOCYTES ABSOLUTE COUNT: 1.4 10*9/L (ref 1.1–3.6)
LYMPHOCYTES RELATIVE PERCENT: 31.2 %
MEAN CORPUSCULAR HEMOGLOBIN CONC: 34.6 g/dL (ref 32.0–36.0)
MEAN CORPUSCULAR HEMOGLOBIN: 36 pg — ABNORMAL HIGH (ref 25.9–32.4)
MEAN CORPUSCULAR VOLUME: 104 fL — ABNORMAL HIGH (ref 77.6–95.7)
MEAN PLATELET VOLUME: 6.7 fL — ABNORMAL LOW (ref 6.8–10.7)
MONOCYTES ABSOLUTE COUNT: 0.8 10*9/L (ref 0.3–0.8)
MONOCYTES RELATIVE PERCENT: 17.1 %
NEUTROPHILS ABSOLUTE COUNT: 2.3 10*9/L (ref 1.8–7.8)
NEUTROPHILS RELATIVE PERCENT: 51.4 %
PLATELET COUNT: 130 10*9/L — ABNORMAL LOW (ref 150–450)
RED BLOOD CELL COUNT: 3.47 10*12/L — ABNORMAL LOW (ref 4.26–5.60)
RED CELL DISTRIBUTION WIDTH: 15.4 % — ABNORMAL HIGH (ref 12.2–15.2)
WBC ADJUSTED: 4.4 10*9/L (ref 3.6–11.2)

## 2023-03-19 LAB — COMPREHENSIVE METABOLIC PANEL
ALBUMIN: 3.9 g/dL (ref 3.4–5.0)
ALKALINE PHOSPHATASE: 93 U/L (ref 46–116)
ALT (SGPT): 50 U/L — ABNORMAL HIGH (ref 10–49)
ANION GAP: 8 mmol/L (ref 5–14)
AST (SGOT): 35 U/L — ABNORMAL HIGH (ref <=34)
BILIRUBIN TOTAL: 0.6 mg/dL (ref 0.3–1.2)
BLOOD UREA NITROGEN: 18 mg/dL (ref 9–23)
BUN / CREAT RATIO: 21
CALCIUM: 8.8 mg/dL (ref 8.7–10.4)
CHLORIDE: 108 mmol/L — ABNORMAL HIGH (ref 98–107)
CO2: 26 mmol/L (ref 20.0–31.0)
CREATININE: 0.84 mg/dL
EGFR CKD-EPI (2021) MALE: >90 mL/min/{1.73_m2} (ref >=60)
GLUCOSE RANDOM: 130 mg/dL (ref 70–179)
POTASSIUM: 3.9 mmol/L (ref 3.4–4.8)
PROTEIN TOTAL: 6.7 g/dL (ref 5.7–8.2)
SODIUM: 142 mmol/L (ref 135–145)

## 2023-03-19 MED ADMIN — azaCITIDine (VIDAZA) syringe: 75 mg/m2 | SUBCUTANEOUS | @ 16:00:00 | Stop: 2023-03-19

## 2023-03-19 MED ADMIN — ondansetron (ZOFRAN) tablet 8 mg: 8 mg | ORAL | @ 15:00:00 | Stop: 2023-03-19

## 2023-03-19 NOTE — Unmapped (Signed)
[facility] Leukemia Clinic Visit Note     Patient Name: Timothy Porter  Patient Age: 77 y.o.  Encounter Date: 03/19/2023    Primary Care Provider:  Pcp, None Per Patient    Referring Physician:  Doreatha Lew, MD  [address]  [place],  [place] [address]    Reason for visit:  AML     Assessment:  Timothy Porter is a 77 y.o. 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  He enrolled on BAML S12 arm (ven x 28 days) and began treatment on 11/22/2022. After 1 cycle of treatment he was found to be in CR without MRD.  He has completed 3 cycles of treatment and presents to clinic today for follow up and consideration of cycle 4.     Timothy Porter is tolerating treatment on aza/ven quite well without complications or toxicities, outside of delayed count recovery.  Cycle 4 has been delayed for hematologic recovery.  On lab review his ANC 1.4 and plt 130.  His counts have recovered sufficiently to proceed with cycle 4 with azacitidine 75mg /m2 x 5 days + venetoclax 400mg  x 21 days.  Continue to hold levaquin while ANC >0.5.  He should continue with valtrex ppx.  He remains transfusion independent, therefore he does not require any additional lab monitoring.  He will RTC in 4 weeks for consideration of cycle 5.     We have had previous discussions regarding possibility of AlloSCT for best option for long term remission and cure from his AML.  Referral has been placed to [name], how did his AutoBMT some years ago.       Free Kappa Chain MM:  He will continue follow up with MM team/[name].           Diarrhea: Resolved with increasing cholestyramine.  Stools are now formed with decrease in urgency.                                            Plan:   - proceed with cycle 4 azacitidine 75mg /m2 x 5 days + venetoclax 400mg  x 21 days.   - valtrex only for ppx.   - BMT referral placed  - RTC in 4 weeks for follow up and consideration of cycle 5.       [name] was available    Timothy Porter, AGNP-BC  Leukemia Research Nurse Practitioner  Hematology/Oncology Division  [facility]  03/19/2023    I personally spent 40 minutes face-to-face and non-face-to-face in the care of this patient, which includes all pre, intra, and post visit time on the date of service.  All documented time was specific to the E/M visit and does not include any procedures that may have been performed.    Hematology/Oncology History Overview Note   Referring/Local Oncologist:  [name]     Diagnosis:   Diagnosis   Date Value Ref Range Status  10/26/2022   Final    Bone marrow, left iliac, aspiration and biopsy  -  Hypercellular bone marrow (50%) involved by acute myeloid leukemia with MECOM rearrangement (23% blasts by manual aspirate differential and 10-20% CD34-positive blasts by immunohistochemistry)   -  Mild to focally moderate marrow fibrosis (MF-1 to 2)   -  Less than 5% plasma cells by manual aspirate differential count (see Comment)  -  See linked reports for associated Ancillary Studies.      This electronic signature is attestation that the pathologist personally reviewed the submitted material(s) and the final diagnosis reflects that evaluation.          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,XY[7]         Myeloid Mutational Panel:        Variants of Known/Likely Clinical Significance:   Gene Coding Predicted Protein Variant allele fraction   SRSF2 c.284C>G p.(Pro95Arg) 21.6 %       Treatment Timeline:  11/22/2022 Cycle 1:  BAML S12 azacitidine 75mg /m2 x 7 days + venetoclax 400mg  x 28 days  12/13/22: Bmbx - Limited, though no increase in blasts, MRD-neg by flow, poor specimen but no blasts (at <0.2%)   01/04/2023 Cycle 2: aza 75 x 7 + ven 400 x 28 days (2 week delay)  01/23/2023: BMbx: <5% blasts by CD34 immunohistochemistry, flow MRD negative  02/09/2023: Cycle 3: Aza 75 x 7 + ven 400 x 21       Multiple myeloma (CMS-HCC)   02/12/2019 Initial Diagnosis    Multiple myeloma (CMS-HCC)     05/28/2019 - 06/03/2019 Chemotherapy    BMT IP AUTO MELPHALAN  Melphalan 140 mg/m2 or 200 mg/m2 IV Day -1     10/22/2019 - 06/27/2022 Chemotherapy    STUDY [id number] IRB# [id number] LENALIDOMIDE (v. 11/04/18)  A Randomized Study of Daratumumab Plus Lenalidomide Versus Lenalidomide Alone as Maintenance Treatment in Patients with Newly Diagnosed Multiple Myeloma Who Are Minimal Residual Disease Positive After Frontline Autologous Stem Cell Transplant.     History of auto stem cell transplant (CMS-HCC) (Resolved)   08/05/2019 Initial Diagnosis    History of auto stem cell transplant (CMS-HCC)     10/22/2019 - 06/27/2022 Chemotherapy    STUDY [id number] IRB# [id number] LENALIDOMIDE (v. 11/04/18)  A Randomized Study of Daratumumab Plus Lenalidomide Versus Lenalidomide Alone as Maintenance Treatment in Patients with Newly Diagnosed Multiple Myeloma Who Are Minimal Residual Disease Positive After Frontline Autologous Stem Cell Transplant.     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  His energy is good.  His diarrhea is improved with increasing cholestyramine and his stools are now more formed.  He is also not experiencing the urgency that he was.  He reports a good appetite, denies any n/v.  No new cough or sob.  No new fevers/chills.   No new complaints today.     Otherwise, he denies new constitutional symptoms such as anorexia, weight loss, fatigue, night sweats or unexplained fevers.  Furthermore, he denies unexplained bleeding or bruising, recurrent or unexplained intercurrent infections, dyspnea on exertion, lightheadedness, palpitations or chest pain.  There have been no new or unexplained  pains or self-identified masses, swelling or enlarged lymph nodes.    ROS reviewed and negative except as noted in H and P     ECOG: 1    Allergies:  No Known Allergies    Medications:   Current Outpatient Medications:     calcium carbonate (TUMS) 200 mg calcium (500 mg) chewable tablet, Chew 2 tablets (400 mg of elem calcium total) Three (3) times a day., Disp: , Rfl:     cholecalciferol, vitamin D3 25 mcg, 1,000 units,, 1,000 unit (25 mcg) tablet, Take 1 tablet (25 mcg total) by mouth., Disp: , Rfl:     cholestyramine (QUESTRAN) 4 gram packet, Take 1 packet by mouth in the morning and 1 packet in the evening. Take with meals. Mix 4 g (1 packet) into 2-3 ounces of fluid and take twice a day.., Disp: 60 packet, Rfl: 5    cyanocobalamin, vitamin B-12, 2,000 mcg Tab, Take 2,000 mcg by mouth in the morning., Disp: 90 tablet, Rfl: 3    docusate sodium (COLACE) 50 MG capsule, Take 1 capsule (50 mg total) by mouth two (2) times a day., Disp: , Rfl:     loratadine (CLARITIN) 10 mg tablet, Take 1 tablet (10 mg total) by mouth daily., Disp: , Rfl:     metoPROLOL succinate (TOPROL-XL) 50 MG 24 hr tablet, Take 1 tablet (50 mg total) by mouth daily., Disp: 30 tablet, Rfl: 11    multivitamin with minerals tablet, Take 1 tablet by mouth daily., Disp: , Rfl:     omeprazole (PRILOSEC) 40 MG capsule, TAKE 1 CAPSULE(40 MG) BY MOUTH DAILY, Disp: 90 capsule, Rfl: 0    ondansetron (ZOFRAN) 8 MG tablet, Take 1 tablet (8 mg total) by mouth every eight (8) hours as needed for nausea., Disp: 30 tablet, Rfl: 2    sildenafiL, pulm.hypertension, (REVATIO) 20 mg tablet, Take 1-5 tablets (20-100 mg total) by mouth daily as needed (for erectile function)., Disp: 90 tablet, Rfl: 3    tamsulosin (FLOMAX) 0.4 mg capsule, Take 1 capsule (0.4 mg total) by mouth daily., Disp: 90 capsule, Rfl: 0    valACYclovir (VALTREX) 500 MG tablet, Take 1 tablet (500 mg total) by mouth daily., Disp: 90 tablet, Rfl: 3  No current facility-administered medications for this visit.    Facility-Administered Medications Ordered in Other Visits:     DTaP-hepatitis B recombinant-IPV (PEDIARIX) 10 mcg-25Lf-25 mcg-10Lf/0.5 mL injection, , , ,     DTaP-hepatitis B recombinant-IPV (PEDIARIX) 10 mcg-25Lf-25 mcg-10Lf/0.5 mL injection, , , ,     haemophilus B polysac-tetanus toxoid (ActHIB) 10 mcg/0.5 mL injection, , , ,     influenza vaccine quad (FLUARIX, FLULAVAL, FLUZONE) (6 MOS & UP) 2020-21 ADS Med, , , ,     pneumococcal conjugate (13-valent) (PREVNAR-13) 0.5 mL vaccine, , , ,     pneumococcal conjugate (13-valent) (PREVNAR-13) 0.5 mL vaccine, , , ,     varicella-zoster gE-AS01B (PF) (SHINGRIX) 50 mcg/0.5 mL injection, , , ,     Medical History:  Past Medical History:   Diagnosis Date    Benign prostatic hyperplasia with lower urinary tract symptoms 11/27/2018    BPH (benign prostatic hyperplasia)     Diarrhea     Fatigue     Fractures     left arm    Glucose intolerance     May 2019: HbA1c 6.2.    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 CMV negative 04/07/20.     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  Planning to build more apartments.       Family History:  Family History   Problem Relation Age of Onset    Dementia Father     No Known Problems Other        Objective:   Vitals:    03/19/23 0741   BP: 159/91   Pulse: 91   Resp: 18   Temp: 36.8 ??C (98.3 ??F)   SpO2: 97%       Physical Exam:  General: Resting, in no apparent distress  HEENT:  PERRL. No scleral icterus or conjunctival injection.   Heart:  RRR.  S1, S2.  No murmurs, gallops or rubs. Mild non-pitting edema noted BLE.   Lungs:  Breathing is unlabored, and patient is speaking full sentences with ease.  CTAB. No rales, ronchi or crackles.    Abdomen:  No distention or pain on palpation.  Bowel sounds are present.  No palpable masses.  Skin:  No rashes, petechiae or purpura. Grossly intact.  Musculoskeletal:  strength is equal bilaterally   Psychiatric:  Range of affect is appropriate.    Neurologic:  Alert and oriented x 4.  Steady gait with assistance of cane.    Test Results:  Clinical Support on 03/19/2023   Component Date Value Ref Range Status    Sodium 03/19/2023 142  135 - 145 mmol/L Final    Potassium 03/19/2023 3.9  3.4 - 4.8 mmol/L Final    Chloride 03/19/2023 108 (H)  98 - 107 mmol/L Final    CO2 03/19/2023 26.0  20.0 - 31.0 mmol/L Final    Anion Gap 03/19/2023 8  5 - 14 mmol/L Final    BUN 03/19/2023 18  9 - 23 mg/dL Final    Creatinine 16/08/9603 0.84  0.73 - 1.18 mg/dL Final    BUN/Creatinine Ratio 03/19/2023 21   Final    eGFR CKD-EPI (2021) Male 03/19/2023 >90  >=60 mL/min/1.48m2 Final    eGFR calculated with CKD-EPI 2021 equation in accordance with [employer] and [employer] of Nephrology Task Force recommendations.    Glucose 03/19/2023 130  70 - 179 mg/dL Final    Calcium 54/07/8118 8.8  8.7 - 10.4 mg/dL Final    Albumin 14/78/2956 3.9  3.4 - 5.0 g/dL Final    Total Protein 03/19/2023 6.7  5.7 - 8.2 g/dL Final    Total Bilirubin 03/19/2023 0.6  0.3 - 1.2 mg/dL Final    AST 21/30/8657 35 (H)  <=34 U/L Final    ALT 03/19/2023 50 (H)  10 - 49 U/L Final    Alkaline Phosphatase 03/19/2023 93  46 - 116 U/L Final    WBC 03/19/2023 4.4  3.6 - 11.2 10*9/L Final    RBC 03/19/2023 3.47 (L)  4.26 - 5.60 10*12/L Final    HGB 03/19/2023 12.5 (L)  12.9 - 16.5 g/dL Final  HCT 03/19/2023 36.1 (L)  39.0 - 48.0 % Final    MCV 03/19/2023 104.0 (H)  77.6 - 95.7 fL Final    MCH 03/19/2023 36.0 (H)  25.9 - 32.4 pg Final    MCHC 03/19/2023 34.6  32.0 - 36.0 g/dL Final    RDW 16/08/9603 15.4 (H)  12.2 - 15.2 % Final    MPV 03/19/2023 6.7 (L)  6.8 - 10.7 fL Final    Platelet 03/19/2023 130 (L)  150 - 450 10*9/L Final    Neutrophils % 03/19/2023 51.4  % Final    Lymphocytes % 03/19/2023 31.2  % Final    Monocytes % 03/19/2023 17.1  % Final    Eosinophils % 03/19/2023 0.0  % Final    Basophils % 03/19/2023 0.3  % Final    Absolute Neutrophils 03/19/2023 2.3  1.8 - 7.8 10*9/L Final    Absolute Lymphocytes 03/19/2023 1.4  1.1 - 3.6 10*9/L Final    Absolute Monocytes 03/19/2023 0.8  0.3 - 0.8 10*9/L Final    Absolute Eosinophils 03/19/2023 0.0  0.0 - 0.5 10*9/L Final    Absolute Basophils 03/19/2023 0.0  0.0 - 0.1 10*9/L Final    Macrocytosis 03/19/2023 Slight (A)  Not Present Final

## 2023-03-19 NOTE — Unmapped (Signed)
Clinical Support on 03/19/2023   Component Date Value Ref Range Status    Sodium 03/19/2023 142  135 - 145 mmol/L Final    Potassium 03/19/2023 3.9  3.4 - 4.8 mmol/L Final    Chloride 03/19/2023 108 (H)  98 - 107 mmol/L Final    CO2 03/19/2023 26.0  20.0 - 31.0 mmol/L Final    Anion Gap 03/19/2023 8  5 - 14 mmol/L Final    BUN 03/19/2023 18  9 - 23 mg/dL Final    Creatinine 96/29/5284 0.84  0.73 - 1.18 mg/dL Final    BUN/Creatinine Ratio 03/19/2023 21   Final    eGFR CKD-EPI (2021) Male 03/19/2023 >90  >=60 mL/min/1.3m2 Final    eGFR calculated with CKD-EPI 2021 equation in accordance with [employer] and [employer] of Nephrology Task Force recommendations.    Glucose 03/19/2023 130  70 - 179 mg/dL Final    Calcium 13/24/4010 8.8  8.7 - 10.4 mg/dL Final    Albumin 27/25/3664 3.9  3.4 - 5.0 g/dL Final    Total Protein 03/19/2023 6.7  5.7 - 8.2 g/dL Final    Total Bilirubin 03/19/2023 0.6  0.3 - 1.2 mg/dL Final    AST 40/34/7425 35 (H)  <=34 U/L Final    ALT 03/19/2023 50 (H)  10 - 49 U/L Final    Alkaline Phosphatase 03/19/2023 93  46 - 116 U/L Final    WBC 03/19/2023 4.4  3.6 - 11.2 10*9/L Final    RBC 03/19/2023 3.47 (L)  4.26 - 5.60 10*12/L Final    HGB 03/19/2023 12.5 (L)  12.9 - 16.5 g/dL Final    HCT 95/63/8756 36.1 (L)  39.0 - 48.0 % Final    MCV 03/19/2023 104.0 (H)  77.6 - 95.7 fL Final    MCH 03/19/2023 36.0 (H)  25.9 - 32.4 pg Final    MCHC 03/19/2023 34.6  32.0 - 36.0 g/dL Final    RDW 43/32/9518 15.4 (H)  12.2 - 15.2 % Final    MPV 03/19/2023 6.7 (L)  6.8 - 10.7 fL Final    Platelet 03/19/2023 130 (L)  150 - 450 10*9/L Final    Neutrophils % 03/19/2023 51.4  % Final    Lymphocytes % 03/19/2023 31.2  % Final    Monocytes % 03/19/2023 17.1  % Final    Eosinophils % 03/19/2023 0.0  % Final    Basophils % 03/19/2023 0.3  % Final    Absolute Neutrophils 03/19/2023 2.3  1.8 - 7.8 10*9/L Final    Absolute Lymphocytes 03/19/2023 1.4  1.1 - 3.6 10*9/L Final    Absolute Monocytes 03/19/2023 0.8  0.3 - 0.8 10*9/L Final    Absolute Eosinophils 03/19/2023 0.0  0.0 - 0.5 10*9/L Final    Absolute Basophils 03/19/2023 0.0  0.0 - 0.1 10*9/L Final    Macrocytosis 03/19/2023 Slight (A)  Not Present Final     \

## 2023-03-19 NOTE — Unmapped (Addendum)
Today we will start the next cycle of treatment.      You will be getting azacitidine for 5 days.  You will also start your venetoclax today.  You will take tablets once a day for 21 days.      You last day taking venetoclax should be 5/12    Clinical Support on 03/19/2023   Component Date Value Ref Range Status    Sodium 03/19/2023 142  135 - 145 mmol/L Final    Potassium 03/19/2023 3.9  3.4 - 4.8 mmol/L Final    Chloride 03/19/2023 108 (H)  98 - 107 mmol/L Final    CO2 03/19/2023 26.0  20.0 - 31.0 mmol/L Final    Anion Gap 03/19/2023 8  5 - 14 mmol/L Final    BUN 03/19/2023 18  9 - 23 mg/dL Final    Creatinine 16/08/9603 0.84  0.73 - 1.18 mg/dL Final    BUN/Creatinine Ratio 03/19/2023 21   Final    eGFR CKD-EPI (2021) Male 03/19/2023 >90  >=60 mL/min/1.47m2 Final    eGFR calculated with CKD-EPI 2021 equation in accordance with [employer] and [employer] of Nephrology Task Force recommendations.    Glucose 03/19/2023 130  70 - 179 mg/dL Final    Calcium 54/07/8118 8.8  8.7 - 10.4 mg/dL Final    Albumin 14/78/2956 3.9  3.4 - 5.0 g/dL Final    Total Protein 03/19/2023 6.7  5.7 - 8.2 g/dL Final    Total Bilirubin 03/19/2023 0.6  0.3 - 1.2 mg/dL Final    AST 21/30/8657 35 (H)  <=34 U/L Final    ALT 03/19/2023 50 (H)  10 - 49 U/L Final    Alkaline Phosphatase 03/19/2023 93  46 - 116 U/L Final    WBC 03/19/2023 4.4  3.6 - 11.2 10*9/L Final    RBC 03/19/2023 3.47 (L)  4.26 - 5.60 10*12/L Final    HGB 03/19/2023 12.5 (L)  12.9 - 16.5 g/dL Final    HCT 84/69/6295 36.1 (L)  39.0 - 48.0 % Final    MCV 03/19/2023 104.0 (H)  77.6 - 95.7 fL Final    MCH 03/19/2023 36.0 (H)  25.9 - 32.4 pg Final    MCHC 03/19/2023 34.6  32.0 - 36.0 g/dL Final    RDW 28/41/3244 15.4 (H)  12.2 - 15.2 % Final    MPV 03/19/2023 6.7 (L)  6.8 - 10.7 fL Final    Platelet 03/19/2023 130 (L)  150 - 450 10*9/L Final    Neutrophils % 03/19/2023 51.4  % Final    Lymphocytes % 03/19/2023 31.2  % Final    Monocytes % 03/19/2023 17.1  % Final    Eosinophils % 03/19/2023 0.0  % Final    Basophils % 03/19/2023 0.3  % Final    Absolute Neutrophils 03/19/2023 2.3  1.8 - 7.8 10*9/L Final    Absolute Lymphocytes 03/19/2023 1.4  1.1 - 3.6 10*9/L Final    Absolute Monocytes 03/19/2023 0.8  0.3 - 0.8 10*9/L Final    Absolute Eosinophils 03/19/2023 0.0  0.0 - 0.5 10*9/L Final    Absolute Basophils 03/19/2023 0.0  0.0 - 0.1 10*9/L Final    Macrocytosis 03/19/2023 Slight (A)  Not Present Final

## 2023-03-19 NOTE — Unmapped (Signed)
Patient educated on various topics, clinical references attached to patient's MyChart and AVS. Time spent 3 minutes.

## 2023-03-19 NOTE — Unmapped (Signed)
Patient arrived to chair 1 for C4 D1 of Azacitadine treatment,  treatment perimeters reviewed and premedicated. Treatment tolerated well, AVS declined and discharged home.

## 2023-03-19 NOTE — Unmapped (Unsigned)
Collected labs for clinic appt in clinic due to no avail appts in basement lab

## 2023-03-20 ENCOUNTER — Ambulatory Visit: Admit: 2023-03-20 | Discharge: 2023-03-21 | Payer: MEDICARE

## 2023-03-20 MED ADMIN — ondansetron (ZOFRAN) tablet 8 mg: 8 mg | ORAL | @ 13:00:00 | Stop: 2023-03-20

## 2023-03-20 MED ADMIN — azaCITIDine (VIDAZA) syringe: 75 mg/m2 | SUBCUTANEOUS | @ 14:00:00 | Stop: 2023-03-20

## 2023-03-20 NOTE — Unmapped (Signed)
Patient tolerated injections well with NAD noted, discharged ambulatory to home/self care.

## 2023-03-21 ENCOUNTER — Ambulatory Visit: Admit: 2023-03-21 | Discharge: 2023-03-22 | Payer: MEDICARE

## 2023-03-21 MED ADMIN — azaCITIDine (VIDAZA) syringe: 75 mg/m2 | SUBCUTANEOUS | @ 13:00:00 | Stop: 2023-03-21

## 2023-03-21 MED ADMIN — ondansetron (ZOFRAN) tablet 8 mg: 8 mg | ORAL | @ 13:00:00 | Stop: 2023-03-21

## 2023-03-21 NOTE — Unmapped (Signed)
Patient arrived to chair 3.  No complaints noted. Patient completed and tolerated treatment.  AVS declined and patient discharged to home.

## 2023-03-22 ENCOUNTER — Ambulatory Visit: Admit: 2023-03-22 | Discharge: 2023-03-23 | Payer: MEDICARE

## 2023-03-22 LAB — COMPREHENSIVE METABOLIC PANEL
ALBUMIN: 3.8 g/dL (ref 3.4–5.0)
ALKALINE PHOSPHATASE: 86 U/L (ref 46–116)
ALT (SGPT): 51 U/L — ABNORMAL HIGH (ref 10–49)
ANION GAP: 4 mmol/L — ABNORMAL LOW (ref 5–14)
AST (SGOT): 36 U/L — ABNORMAL HIGH (ref <=34)
BILIRUBIN TOTAL: 0.7 mg/dL (ref 0.3–1.2)
BLOOD UREA NITROGEN: 19 mg/dL (ref 9–23)
BUN / CREAT RATIO: 19
CALCIUM: 9.4 mg/dL (ref 8.7–10.4)
CHLORIDE: 106 mmol/L (ref 98–107)
CO2: 28 mmol/L (ref 20.0–31.0)
CREATININE: 0.99 mg/dL
EGFR CKD-EPI (2021) MALE: 79 mL/min/{1.73_m2} (ref >=60)
GLUCOSE RANDOM: 122 mg/dL (ref 70–179)
POTASSIUM: 4.1 mmol/L (ref 3.4–4.8)
PROTEIN TOTAL: 6.5 g/dL (ref 5.7–8.2)
SODIUM: 138 mmol/L (ref 135–145)

## 2023-03-22 LAB — CBC W/ AUTO DIFF
BASOPHILS ABSOLUTE COUNT: 0 10*9/L (ref 0.0–0.1)
BASOPHILS RELATIVE PERCENT: 0.2 %
EOSINOPHILS ABSOLUTE COUNT: 0 10*9/L (ref 0.0–0.5)
EOSINOPHILS RELATIVE PERCENT: 0 %
HEMATOCRIT: 35 % — ABNORMAL LOW (ref 39.0–48.0)
HEMOGLOBIN: 12.2 g/dL — ABNORMAL LOW (ref 12.9–16.5)
LYMPHOCYTES ABSOLUTE COUNT: 1 10*9/L — ABNORMAL LOW (ref 1.1–3.6)
LYMPHOCYTES RELATIVE PERCENT: 23.1 %
MEAN CORPUSCULAR HEMOGLOBIN CONC: 35 g/dL (ref 32.0–36.0)
MEAN CORPUSCULAR HEMOGLOBIN: 36.2 pg — ABNORMAL HIGH (ref 25.9–32.4)
MEAN CORPUSCULAR VOLUME: 103.5 fL — ABNORMAL HIGH (ref 77.6–95.7)
MEAN PLATELET VOLUME: 6.7 fL — ABNORMAL LOW (ref 6.8–10.7)
MONOCYTES ABSOLUTE COUNT: 0.6 10*9/L (ref 0.3–0.8)
MONOCYTES RELATIVE PERCENT: 13.8 %
NEUTROPHILS ABSOLUTE COUNT: 2.8 10*9/L (ref 1.8–7.8)
NEUTROPHILS RELATIVE PERCENT: 62.9 %
PLATELET COUNT: 120 10*9/L — ABNORMAL LOW (ref 150–450)
RED BLOOD CELL COUNT: 3.38 10*12/L — ABNORMAL LOW (ref 4.26–5.60)
RED CELL DISTRIBUTION WIDTH: 15.1 % (ref 12.2–15.2)
WBC ADJUSTED: 4.5 10*9/L (ref 3.6–11.2)

## 2023-03-22 LAB — URIC ACID: URIC ACID: 4.1 mg/dL

## 2023-03-22 LAB — LACTATE DEHYDROGENASE: LACTATE DEHYDROGENASE: 191 U/L (ref 120–246)

## 2023-03-22 LAB — MAGNESIUM: MAGNESIUM: 2 mg/dL (ref 1.6–2.6)

## 2023-03-22 MED ADMIN — azaCITIDine (VIDAZA) syringe: 75 mg/m2 | SUBCUTANEOUS | @ 13:00:00 | Stop: 2023-03-22

## 2023-03-22 MED ADMIN — ondansetron (ZOFRAN) tablet 8 mg: 8 mg | ORAL | @ 13:00:00 | Stop: 2023-03-22

## 2023-03-22 NOTE — Unmapped (Signed)
Hospital Outpatient Visit on 03/22/2023   Component Date Value Ref Range Status    Sodium 03/22/2023 138  135 - 145 mmol/L Final    Potassium 03/22/2023 4.1  3.4 - 4.8 mmol/L Final    Chloride 03/22/2023 106  98 - 107 mmol/L Final    CO2 03/22/2023 28.0  20.0 - 31.0 mmol/L Final    Anion Gap 03/22/2023 4 (L)  5 - 14 mmol/L Final    BUN 03/22/2023 19  9 - 23 mg/dL Final    Creatinine 16/08/9603 0.99  0.73 - 1.18 mg/dL Final    BUN/Creatinine Ratio 03/22/2023 19   Final    eGFR CKD-EPI (2021) Male 03/22/2023 79  >=60 mL/min/1.45m2 Final    eGFR calculated with CKD-EPI 2021 equation in accordance with [employer] and [employer] of Nephrology Task Force recommendations.    Glucose 03/22/2023 122  70 - 179 mg/dL Final    Calcium 54/07/8118 9.4  8.7 - 10.4 mg/dL Final    Albumin 14/78/2956 3.8  3.4 - 5.0 g/dL Final    Total Protein 03/22/2023 6.5  5.7 - 8.2 g/dL Final    Total Bilirubin 03/22/2023 0.7  0.3 - 1.2 mg/dL Final    AST 21/30/8657 36 (H)  <=34 U/L Final    ALT 03/22/2023 51 (H)  10 - 49 U/L Final    Alkaline Phosphatase 03/22/2023 86  46 - 116 U/L Final    LDH 03/22/2023 191  120 - 246 U/L Final    Uric Acid 03/22/2023 4.1  3.7 - 9.2 mg/dL Final    Magnesium 84/69/6295 2.0  1.6 - 2.6 mg/dL Final    WBC 28/41/3244 4.5  3.6 - 11.2 10*9/L Final    RBC 03/22/2023 3.38 (L)  4.26 - 5.60 10*12/L Final    HGB 03/22/2023 12.2 (L)  12.9 - 16.5 g/dL Final    HCT 11/29/7251 35.0 (L)  39.0 - 48.0 % Final    MCV 03/22/2023 103.5 (H)  77.6 - 95.7 fL Final    MCH 03/22/2023 36.2 (H)  25.9 - 32.4 pg Final    MCHC 03/22/2023 35.0  32.0 - 36.0 g/dL Final    RDW 66/44/0347 15.1  12.2 - 15.2 % Final    MPV 03/22/2023 6.7 (L)  6.8 - 10.7 fL Final    Platelet 03/22/2023 120 (L)  150 - 450 10*9/L Final    Neutrophils % 03/22/2023 62.9  % Final    Lymphocytes % 03/22/2023 23.1  % Final    Monocytes % 03/22/2023 13.8  % Final    Eosinophils % 03/22/2023 0.0  % Final    Basophils % 03/22/2023 0.2  % Final Absolute Neutrophils 03/22/2023 2.8  1.8 - 7.8 10*9/L Final    Absolute Lymphocytes 03/22/2023 1.0 (L)  1.1 - 3.6 10*9/L Final    Absolute Monocytes 03/22/2023 0.6  0.3 - 0.8 10*9/L Final    Absolute Eosinophils 03/22/2023 0.0  0.0 - 0.5 10*9/L Final    Absolute Basophils 03/22/2023 0.0  0.0 - 0.1 10*9/L Final    Macrocytosis 03/22/2023 Slight (A)  Not Present Final

## 2023-03-22 NOTE — Unmapped (Signed)
Patient in chair 5 for chemo and lab draw.    Previous labs reviewed; Chemo inj. Given, tolerated well.  Blood drawn; AVS declined; Ambulatory on discharge.

## 2023-03-23 ENCOUNTER — Ambulatory Visit: Admit: 2023-03-23 | Discharge: 2023-03-24 | Payer: MEDICARE

## 2023-03-23 MED ADMIN — azaCITIDine (VIDAZA) syringe: 75 mg/m2 | SUBCUTANEOUS | @ 13:00:00 | Stop: 2023-03-23

## 2023-03-23 MED ADMIN — ondansetron (ZOFRAN) tablet 8 mg: 8 mg | ORAL | @ 13:00:00 | Stop: 2023-03-23

## 2023-03-23 NOTE — Unmapped (Signed)
Pt arrived for scheduled injections. Premed given. Pt tolerated injections well and discharged home.

## 2023-03-23 NOTE — Unmapped (Signed)
On 12/13/2022 marrow revealed CRi  On 01/23/2023 marrow revealed CRi

## 2023-03-26 NOTE — Unmapped (Signed)
[facility] Refill Coordination Note    Specialty Medication(s) to be Shipped:   Hematology/Oncology: Venclexta 100mg     Other medication(s) to be shipped: No additional medications requested for fill at this time     Timothy Porter, DOB: 07-Aug-1946  Phone: [phone] (home)       All above HIPAA information was verified with patient.     Was a Nurse, learning disability used for this call? No    Completed refill call assessment today to schedule patient's medication shipment from the [facility] [phone]).  All relevant notes have been reviewed.     Specialty medication(s) and dose(s) confirmed: Regimen is correct and unchanged.   Changes to medications: [employer] reports no changes at this time.  Changes to insurance: No  New side effects reported not previously addressed with a pharmacist or physician: None reported  Questions for the pharmacist: No    Confirmed patient received a Conservation officer, historic buildings and a Surveyor, mining with first shipment. The patient will receive a drug information handout for each medication shipped and additional FDA Medication Guides as required.       DISEASE/MEDICATION-SPECIFIC INFORMATION        N/A    SPECIALTY MEDICATION ADHERENCE     Medication Adherence    Patient reported X missed doses in the last month: 0  Specialty Medication: Venclexta 100mg   Patient is on additional specialty medications: No  Informant: patient              Were doses missed due to medication being on hold? No    Venclexta 100 mg: 8 days of medicine on hand       REFERRAL TO PHARMACIST     Referral to the pharmacist: Not needed      [facility]     Shipping address confirmed in Epic.       Delivery Scheduled: Yes, Expected medication delivery date: 03/28/23.     Medication will be delivered via Next Day Courier to the prescription address in Epic WAM.    Jasper Loser   [facility] Specialty Technician

## 2023-03-27 MED FILL — VENCLEXTA 100 MG TABLET: ORAL | 28 days supply | Qty: 84 | Fill #1

## 2023-04-01 NOTE — Unmapped (Signed)
[facility] Patient Visit    Patient Name: Timothy Porter   Patient DOB: 08-Oct-1946 PCP: Oneita Hurt, None Per Patient   Primary Oncologist: Virgil Benedict       REASON FOR VISIT: 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    Diagnoses:  Multiple myeloma, kappa free light chain.  Diagnosed in March 2020, autologous hematopoietic cell transplantation with day 0 on 05/28/2019, followed by lenalidomide maintenance.  Bone marrow biopsy in November 2023 with less than 5% plasma cells, serum free light chain analysis in March 2024 with normal serum kappa free light chains.  Serum protein electrophoresis on 02/08/2023 with no monoclonal protein detected.  Acute myeloid leukemia.  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  He was enrolled on a clinical trial on BAML S12 arm (ven x 28 days) and began treatment on 11/22/2022.  After 1 cycle of treatment, he was found to be in a complete remission without minimal residual disease.  He has completed 3 cycles of therapy.  Current treatment is Azacytidine and venetoclax.    Other Problems:  Hypertension.  Benign prostatic hypertrophy.  Glucose intolerance.  History of CMV viremia.  History of atrial fibrillation.    ASSESSMENT:    In summary, this is a 77 year old gentleman with history of multiple myeloma, status post autologous hematopoietic cell transplantation in 2020, now with acute myeloid leukemia on treatment, with both diseases and complete remission, presenting for consideration of reduced intensity conditioning allogenic hematopoietic cell transplantation.    The patient is familiar with the concept of transplant from his previous experience, but today I discussed the procedures involved with allogenic transplant and the risk profile associated with this.  We discussed that he is at the upper age limit of candidacy for allogenic transplant, but if he is interested in doing this and no significant concerns are uncovered on the pretransplant evaluation, we may be able to proceed.  I discussed the need for a functional assessment visit, complete disease restaging, organ function testing, and infectious disease marker evaluation.  I also talked about the need for donor search and identification, with the possibility of matched unrelated donor or haploidentical donors as possible candidates.  I discussed the need for central catheter placement.  I also talked about the transplant hospitalization, including receipt of conditioning chemotherapy, the regimen choice dependent on donor source.  Possibilities would include reduced intensity conditioning with busulfan and fludarabine, versus reduced intensity conditioning with fludarabine, cyclophosphamide, and total body irradiation.  In either case, we would consider the use of posttransplant cyclophosphamide for GVHD prophylaxis.  Other elements of GVHD prophylaxis would include tacrolimus with or without CellCept.  I talked about the need for a committed caregiver and close clinic follow-up for 3 months following stem cell infusion.  Because the patient lives locally, he could go back home during that time, that would need to come to clinic 2 or 3 times weekly with close surveillance.  I discussed that our typical treatment related mortality estimate in this situation might be as high as 20 to 30%, and the opportunity  for long-term disease-free survival would probably be in the 30 to 40% range, given the possibility of relapse as well.  If he is interested in moving forward, we will also consider the possibility of clinical trials for posttransplant maintenance strategies to reduce relapse risk.    PLAN/RECOMMENDATIONS:    Mr. Nannini appears interested in proceeding with allogeneic transplant. We will start a donor search.  Plan for FAV  Donor and conditioning regimen choice pending results of donor evaluation. Timing of transplant can be anytime that a donor is ready, given current disease control.    I personally spent 40 minutes face-to-face and non-face-to-face in the care of this patient, which includes all pre, intra, and post visit time on the date of service.  All documented time was specific to the E/M visit and does not include any procedures that may have been performed.      Sabas Sous, MD, MPH  Professor of Medicine  Division of Hematology, Bone Marrow Transplant and Cellular Therapy Program         CC: Virgil Benedict, A*      HISTORY OF PRESENT ILLNESS:    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 male who comes to the [facility] for consideration of allogeneic transplant for a diagnosis of acute myeloid leukemia.    --fatigue, some balance issues, uses a cane  --walks about a morning  --does all ADLs  --diarrhea/constipation  --no shortness of breath  --no pain  --slight sinus headaches  --no real neuropathy except for occ 5th finger numbness      HEMATOLOGICAL / ONCOLOGICAL HISTORY:  Hematology/Oncology History Overview Note   Referring/Local Oncologist:  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      This electronic signature is attestation that the pathologist personally reviewed the submitted material(s) and the final diagnosis reflects that evaluation.          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,XY[7]         Myeloid Mutational Panel:        Variants of Known/Likely Clinical Significance:   Gene Coding Predicted Protein Variant allele fraction   SRSF2 c.284C>G p.(Pro95Arg) 21.6 %       Treatment Timeline:  11/22/2022 Cycle 1:  BAML S12 azacitidine 75mg /m2 x 7 days + venetoclax 400mg  x 28 days  12/13/22: Bmbx - Limited, though no increase in blasts, MRD-neg by flow, poor specimen but no blasts (at <0.2%)   01/04/2023 Cycle 2: aza 75 x 7 + ven 400 x 28 days (2 week delay)  01/23/2023: BMbx: <5% blasts by CD34 immunohistochemistry, flow MRD negative  02/09/2023: Cycle 3: Aza 75 x 7 + ven 400 x 21       Multiple myeloma (CMS-HCC)   02/12/2019 Initial Diagnosis    Multiple myeloma (CMS-HCC)  05/28/2019 - 06/03/2019 Chemotherapy    BMT IP AUTO MELPHALAN  Melphalan 140 mg/m2 or 200 mg/m2 IV Day -1     10/22/2019 - 06/27/2022 Chemotherapy    STUDY [id number] IRB# [id number] LENALIDOMIDE (v. 11/04/18)  A Randomized Study of Daratumumab Plus Lenalidomide Versus Lenalidomide Alone as Maintenance Treatment in Patients with Newly Diagnosed Multiple Myeloma Who Are Minimal Residual Disease Positive After Frontline Autologous Stem Cell Transplant.     History of auto stem cell transplant (CMS-HCC) (Resolved)   08/05/2019 Initial Diagnosis    History of auto stem cell transplant (CMS-HCC)     10/22/2019 - 06/27/2022 Chemotherapy    STUDY [id number] IRB# [id number] LENALIDOMIDE (v. 11/04/18)  A Randomized Study of Daratumumab Plus Lenalidomide Versus Lenalidomide Alone as Maintenance Treatment in Patients with Newly Diagnosed Multiple Myeloma Who Are Minimal Residual Disease Positive After Frontline Autologous Stem Cell Transplant.     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)         OTHER PAST MEDICAL HISTORY:   Past Medical History:   Diagnosis Date    Benign prostatic hyperplasia with lower urinary tract symptoms 11/27/2018    BPH (benign prostatic hyperplasia)     Diarrhea     Fatigue     Fractures     left arm    Glucose intolerance     May 2019: HbA1c 6.2.    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 CMV negative 04/07/20.     Pancytopenia (CMS-HCC) 03/14/2023    Prediabetes        ALLERGIES: Patient has no known allergies.    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  Planning to build more apartments.     Social Determinants of Health     Financial Resource Strain: Low Risk  (11/02/2022)    Overall Financial Resource Strain (CARDIA)     Difficulty of Paying Living Expenses: Not hard at all   Food Insecurity: No Food Insecurity (03/04/2021)    Hunger Vital Sign     Worried About Running Out of Food in the Last Year: Never true     Ran Out of Food in the Last Year: Never true   Transportation Needs: No Transportation Needs (09/03/2020)    PRAPARE - Therapist, art (Medical): No     Lack of Transportation (Non-Medical): No   Physical Activity: Insufficiently Active (05/28/2019)    Exercise Vital  Sign     Days of Exercise per Week: 7 days     Minutes of Exercise per Session: 20 min   Stress: No Stress Concern Present (05/28/2019)    [employer] of Occupational Health - Occupational Stress Questionnaire     Feeling of Stress : Not at all   Social Connections: Unknown (05/28/2019)    Social Connection and Isolation Panel [NHANES]     Frequency of Communication with Friends and Family: More than three times a week     Frequency of Social Gatherings with Friends and Family: Twice a week     Attends Religious Services: Patient declined     Database administrator or Organizations: Yes     Marital Status: Married       FAMILY HISTORY:   Family History   Problem Relation Age of Onset    Dementia Father     No Known Problems Other        REVIEW OF SYSTEMS:  The detailed 12 systems review is as per the HPI and otherwise noncontributory.        MEDICATIONS:   Current Outpatient Medications:     calcium carbonate (TUMS) 200 mg calcium (500 mg) chewable tablet, Chew 2 tablets (400 mg of elem calcium total) Three (3) times a day., Disp: , Rfl:     cholecalciferol, vitamin D3 25 mcg, 1,000 units,, 1,000 unit (25 mcg) tablet, Take 1 tablet (25 mcg total) by mouth., Disp: , Rfl:     cholestyramine (QUESTRAN) 4 gram packet, Take 1 packet by mouth in the morning and 1 packet in the evening. Take with meals. Mix 4 g (1 packet) into 2-3 ounces of fluid and take twice a day.., Disp: 60 packet, Rfl: 5    cyanocobalamin, vitamin B-12, 2,000 mcg Tab, Take 2,000 mcg by mouth in the morning., Disp: 90 tablet, Rfl: 3    docusate sodium (COLACE) 50 MG capsule, Take 1 capsule (50 mg total) by mouth two (2) times a day., Disp: , Rfl: loratadine (CLARITIN) 10 mg tablet, Take 1 tablet (10 mg total) by mouth daily., Disp: , Rfl:     metoPROLOL succinate (TOPROL-XL) 50 MG 24 hr tablet, Take 1 tablet (50 mg total) by mouth daily., Disp: 30 tablet, Rfl: 11    multivitamin with minerals tablet, Take 1 tablet by mouth daily., Disp: , Rfl:     omeprazole (PRILOSEC) 40 MG capsule, TAKE 1 CAPSULE(40 MG) BY MOUTH DAILY, Disp: 90 capsule, Rfl: 0    ondansetron (ZOFRAN) 8 MG tablet, Take 1 tablet (8 mg total) by mouth every eight (8) hours as needed for nausea., Disp: 30 tablet, Rfl: 2    sildenafiL, pulm.hypertension, (REVATIO) 20 mg tablet, Take 1-5 tablets (20-100 mg total) by mouth daily as needed (for erectile function)., Disp: 90 tablet, Rfl: 3    tamsulosin (FLOMAX) 0.4 mg capsule, Take 1 capsule (0.4 mg total) by mouth daily., Disp: 90 capsule, Rfl: 0    valACYclovir (VALTREX) 500 MG tablet, Take 1 tablet (500 mg total) by mouth daily., Disp: 90 tablet, Rfl: 3    venetoclax (VENCLEXTA) 100 mg tablet, Take 4 tablets (400 mg total) by mouth daily for 21 days., Disp: 84 tablet, Rfl: 1  No current facility-administered medications for this visit.    Facility-Administered Medications Ordered in Other Visits:     DTaP-hepatitis B recombinant-IPV (PEDIARIX) 10 mcg-25Lf-25 mcg-10Lf/0.5 mL injection, , , ,     DTaP-hepatitis B recombinant-IPV (PEDIARIX) 10 mcg-25Lf-25 mcg-10Lf/0.5 mL injection, , , ,  haemophilus B polysac-tetanus toxoid (ActHIB) 10 mcg/0.5 mL injection, , , ,     influenza vaccine quad (FLUARIX, FLULAVAL, FLUZONE) (6 MOS & UP) 2020-21 ADS Med, , , ,     pneumococcal conjugate (13-valent) (PREVNAR-13) 0.5 mL vaccine, , , ,     pneumococcal conjugate (13-valent) (PREVNAR-13) 0.5 mL vaccine, , , ,     varicella-zoster gE-AS01B (PF) (SHINGRIX) 50 mcg/0.5 mL injection, , , ,      VITAL SIGNS:   BSA: There is no height or weight on file to calculate BSA.  There were no vitals taken for this visit.    ECOG PERFORMANCE STATUS: 1    PHYSICAL EXAM:  GENERAL: Patient appears well and in no obvious distress.  HEENT: EOMI, no scleral icterus.  CV: Regular rate and rhythm with no murmurs. No edema.  PULM: Clear to auscultation bilaterally.  GI: Abdomen nondistended, nontender with normoactive bowel sounds. There is no organomegaly.  MUSCSK: No palpable abnormalities. No spinal/paraspinal tenderness.  LYMPH: No palpable cervical, supraclavicular, axillary, epitrochlear lymphadenopathy.  NEURO: A&Ox4  SKIN: no rashes or lesions     LABS/IMAGING/OTHER TESTS:  Lab Results   Component Value Date    WBC 4.5 03/22/2023    HGB 12.2 (L) 03/22/2023    HCT 35.0 (L) 03/22/2023    PLT 120 (L) 03/22/2023    NEUTROABS 2.8 03/22/2023    LYMPHSABS 1.0 (L) 03/22/2023    MCV 103.5 (H) 03/22/2023    NA 138 03/22/2023    K 4.1 03/22/2023    CL 106 03/22/2023    CO2 28.0 03/22/2023    BUN 19 03/22/2023    CREATININE 0.99 03/22/2023    GLU 122 03/22/2023    CALCIUM 9.4 03/22/2023    MG 2.0 03/22/2023    PHOS 3.1 01/31/2023    URICACID 4.1 03/22/2023    BILITOT 0.7 03/22/2023    BILIDIR <0.10 07/03/2019    PROT 6.5 03/22/2023    ALBUMIN 3.8 03/22/2023    ALT 51 (H) 03/22/2023    AST 36 (H) 03/22/2023    ALKPHOS 86 03/22/2023    GGT 64 06/28/2019    INR 0.99 11/17/2022    APTT 26.2 11/17/2022    B2MG S 2.32 08/24/2020    LDH 191 03/22/2023    IGG 992 02/08/2023    IGM 9 (L) 02/08/2023    IGA 29.2 (L) 02/08/2023    SPEINTERP  02/08/2023      Comment:      The SPE pattern demonstrates a slight irregularity in the gamma region, which may represent monoclonal protein. See Immunofixation report.      IFE  02/08/2023      Comment:      No monoclonal protein detected.      KAPFS 1.07 02/08/2023    LAMFS 0.64 02/08/2023    RATKL 1.67 (H) 02/08/2023    IFEUR  05/13/2019      Comment:      Monoclonal component typed as free Kappa light chains.

## 2023-04-02 DIAGNOSIS — Z7682 Awaiting organ transplant status: Principal | ICD-10-CM

## 2023-04-03 ENCOUNTER — Other Ambulatory Visit: Admit: 2023-04-03 | Discharge: 2023-04-03 | Payer: MEDICARE

## 2023-04-03 ENCOUNTER — Encounter: Admit: 2023-04-03 | Discharge: 2023-04-03 | Payer: MEDICARE | Attending: Internal Medicine | Primary: Internal Medicine

## 2023-04-03 ENCOUNTER — Institutional Professional Consult (permissible substitution): Admit: 2023-04-03 | Discharge: 2023-04-03 | Payer: MEDICARE

## 2023-04-03 ENCOUNTER — Ambulatory Visit: Admit: 2023-04-03 | Discharge: 2023-04-03 | Payer: MEDICARE | Attending: Internal Medicine | Primary: Internal Medicine

## 2023-04-03 DIAGNOSIS — C9201 Acute myeloblastic leukemia, in remission: Principal | ICD-10-CM

## 2023-04-03 DIAGNOSIS — Z9484 Stem cells transplant status: Principal | ICD-10-CM

## 2023-04-03 DIAGNOSIS — C9001 Multiple myeloma in remission: Principal | ICD-10-CM

## 2023-04-03 DIAGNOSIS — Z7682 Awaiting organ transplant status: Principal | ICD-10-CM

## 2023-04-03 LAB — CMV IGG: CMV IGG: POSITIVE — AB

## 2023-04-03 LAB — HLA ANTIBODY SCREEN

## 2023-04-03 NOTE — Unmapped (Signed)
Pt oriented to room, call bell, and location of the nurse's station. All upcoming appointments while in clinic explained and reviewed during the rooming process.  Pt encouraged to ask any questions and voice any concerns. Patient denies any questions or concerns at this time. Will continue educating as needed throughout patient's visit. Time spent 5 minutes.

## 2023-04-03 NOTE — Unmapped (Signed)
Patient  seen for initial allogeneic BMT consultation on 04/03/23 for AML.  Patient arrived with wife. Provided  BMT Patient Handbook,  National Marrow Donor Program website information and coordinator contact information.  Also provided fertility preservation information which patient was not interested. Provided pre-transplant teaching regarding purpose of a BMT, HLA typing, donor search, timing as conveyed by Dr.Wood, hospitalization requirements during transplant, post-transplant expectations including 24/7 caregiver and the need to remain in the [facility] area for at least the first 100 days as well as long-term follow-up requirements.  Provided with HLA sibling form. Wife is a Education officer, community and will arrange for patient to get cleaning done. Patient verbalized an understanding of teaching and will contact me for further information prn. Time spent with patient: 45 minutes.

## 2023-04-05 DIAGNOSIS — Z7682 Awaiting organ transplant status: Principal | ICD-10-CM

## 2023-04-09 ENCOUNTER — Encounter: Admit: 2023-04-09 | Discharge: 2023-04-09 | Payer: MEDICARE | Attending: Internal Medicine | Primary: Internal Medicine

## 2023-04-09 LAB — HLA CL I&II, HIGH RES

## 2023-04-09 NOTE — Unmapped (Signed)
Spoke with patient and reviewed FAV itinerary with patient in detail. Patient confirmed understanding.

## 2023-04-11 LAB — FSAB CLASS 2 ANTIBODY SPECIFICITY: HLA CL2 AB RESULT: NEGATIVE

## 2023-04-11 LAB — FSAB CLASS 1 ANTIBODY SPECIFICITY: HLA CLASS 1 ANTIBODY RESULT: NEGATIVE

## 2023-04-12 ENCOUNTER — Encounter: Admit: 2023-04-12 | Discharge: 2023-04-12 | Payer: MEDICARE | Attending: Internal Medicine | Primary: Internal Medicine

## 2023-04-12 LAB — HI RES SBT ABCDR

## 2023-04-13 DIAGNOSIS — C9200 Acute myeloblastic leukemia, not having achieved remission: Principal | ICD-10-CM

## 2023-04-13 MED ORDER — VENCLEXTA 100 MG TABLET
ORAL_TABLET | Freq: Every day | ORAL | 1 refills | 21.00000 days
Start: 2023-04-13 — End: 2023-05-04

## 2023-04-16 ENCOUNTER — Institutional Professional Consult (permissible substitution): Admit: 2023-04-16 | Discharge: 2023-04-17 | Payer: MEDICARE

## 2023-04-16 ENCOUNTER — Ambulatory Visit: Admit: 2023-04-16 | Discharge: 2023-04-17 | Payer: MEDICARE

## 2023-04-16 ENCOUNTER — Encounter: Admit: 2023-04-16 | Discharge: 2023-04-17 | Payer: MEDICARE | Attending: Internal Medicine | Primary: Internal Medicine

## 2023-04-16 ENCOUNTER — Other Ambulatory Visit: Admit: 2023-04-16 | Discharge: 2023-04-17 | Payer: MEDICARE

## 2023-04-16 ENCOUNTER — Ambulatory Visit: Admit: 2023-04-16 | Discharge: 2023-04-17 | Payer: MEDICARE | Attending: Adult Health | Primary: Adult Health

## 2023-04-16 DIAGNOSIS — C9200 Acute myeloblastic leukemia, not having achieved remission: Principal | ICD-10-CM

## 2023-04-16 DIAGNOSIS — Z7682 Awaiting organ transplant status: Principal | ICD-10-CM

## 2023-04-16 DIAGNOSIS — C9201 Acute myeloblastic leukemia, in remission: Principal | ICD-10-CM

## 2023-04-16 DIAGNOSIS — C9000 Multiple myeloma not having achieved remission: Principal | ICD-10-CM

## 2023-04-16 DIAGNOSIS — E876 Hypokalemia: Principal | ICD-10-CM

## 2023-04-16 LAB — CBC W/ AUTO DIFF
BASOPHILS ABSOLUTE COUNT: 0 10*9/L (ref 0.0–0.1)
BASOPHILS RELATIVE PERCENT: 0.5 %
EOSINOPHILS ABSOLUTE COUNT: 0 10*9/L (ref 0.0–0.5)
EOSINOPHILS RELATIVE PERCENT: 0 %
HEMATOCRIT: 35.9 % — ABNORMAL LOW (ref 39.0–48.0)
HEMOGLOBIN: 12.5 g/dL — ABNORMAL LOW (ref 12.9–16.5)
LYMPHOCYTES ABSOLUTE COUNT: 1.2 10*9/L (ref 1.1–3.6)
LYMPHOCYTES RELATIVE PERCENT: 30.1 %
MEAN CORPUSCULAR HEMOGLOBIN CONC: 34.9 g/dL (ref 32.0–36.0)
MEAN CORPUSCULAR HEMOGLOBIN: 35.6 pg — ABNORMAL HIGH (ref 25.9–32.4)
MEAN CORPUSCULAR VOLUME: 102.2 fL — ABNORMAL HIGH (ref 77.6–95.7)
MEAN PLATELET VOLUME: 7.3 fL (ref 6.8–10.7)
MONOCYTES ABSOLUTE COUNT: 0.5 10*9/L (ref 0.3–0.8)
MONOCYTES RELATIVE PERCENT: 12 %
NEUTROPHILS ABSOLUTE COUNT: 2.3 10*9/L (ref 1.8–7.8)
NEUTROPHILS RELATIVE PERCENT: 57.4 %
PLATELET COUNT: 226 10*9/L (ref 150–450)
RED BLOOD CELL COUNT: 3.52 10*12/L — ABNORMAL LOW (ref 4.26–5.60)
RED CELL DISTRIBUTION WIDTH: 14.5 % (ref 12.2–15.2)
WBC ADJUSTED: 4 10*9/L (ref 3.6–11.2)

## 2023-04-16 LAB — PROTIME-INR
INR: 0.93
PROTIME: 10.4 s (ref 9.9–12.6)

## 2023-04-16 LAB — COMPREHENSIVE METABOLIC PANEL
ALBUMIN: 3.9 g/dL (ref 3.4–5.0)
ALKALINE PHOSPHATASE: 91 U/L (ref 46–116)
ALT (SGPT): 38 U/L (ref 10–49)
ANION GAP: 8 mmol/L (ref 5–14)
AST (SGOT): 28 U/L (ref <=34)
BILIRUBIN TOTAL: 0.6 mg/dL (ref 0.3–1.2)
BLOOD UREA NITROGEN: 17 mg/dL (ref 9–23)
BUN / CREAT RATIO: 17
CALCIUM: 8.7 mg/dL (ref 8.7–10.4)
CHLORIDE: 108 mmol/L — ABNORMAL HIGH (ref 98–107)
CO2: 26 mmol/L (ref 20.0–31.0)
CREATININE: 1 mg/dL
EGFR CKD-EPI (2021) MALE: 78 mL/min/{1.73_m2} (ref >=60)
GLUCOSE RANDOM: 134 mg/dL (ref 70–179)
POTASSIUM: 3.8 mmol/L (ref 3.4–4.8)
PROTEIN TOTAL: 6.5 g/dL (ref 5.7–8.2)
SODIUM: 142 mmol/L (ref 135–145)

## 2023-04-16 LAB — URINALYSIS WITH MICROSCOPY WITH CULTURE REFLEX PERFORMABLE
BACTERIA: NONE SEEN /HPF
BILIRUBIN UA: NEGATIVE
BLOOD UA: NEGATIVE
GLUCOSE UA: NEGATIVE
KETONES UA: NEGATIVE
LEUKOCYTE ESTERASE UA: NEGATIVE
NITRITE UA: NEGATIVE
PH UA: 6 (ref 5.0–9.0)
PROTEIN UA: NEGATIVE
RBC UA: 3 /HPF (ref <=3)
SPECIFIC GRAVITY UA: 1.025 (ref 1.003–1.030)
SQUAMOUS EPITHELIAL: <1 /HPF (ref 0–5)
UROBILINOGEN UA: <2
WBC UA: 2 /HPF (ref <=2)

## 2023-04-16 LAB — MAGNESIUM: MAGNESIUM: 1.7 mg/dL (ref 1.6–2.6)

## 2023-04-16 LAB — HEPATITIS B CORE ANTIBODY, TOTAL: HEPATITIS B CORE TOTAL ANTIBODY: NONREACTIVE

## 2023-04-16 LAB — HEPATITIS B SURFACE ANTIGEN: HEPATITIS B SURFACE ANTIGEN: NONREACTIVE

## 2023-04-16 LAB — URIC ACID: URIC ACID: 3.5 mg/dL — ABNORMAL LOW

## 2023-04-16 LAB — HSV ANTIBODIES, IGG
HERPES SIMPLEX VIRUS 1 IGG: POSITIVE — AB
HERPES SIMPLEX VIRUS 2 IGG: NEGATIVE
HSV 2 IGG OD: 0.0444

## 2023-04-16 LAB — LACTATE DEHYDROGENASE: LACTATE DEHYDROGENASE: 186 U/L (ref 120–246)

## 2023-04-16 LAB — APTT
APTT: 28.5 s (ref 24.8–38.4)
HEPARIN CORRELATION: 0.2

## 2023-04-16 LAB — TOXOPLASMA GONDII ANTIBODY, IGG: TOXOPLASMA GONDII IGG: NEGATIVE

## 2023-04-16 LAB — SYPHILIS SCREEN: SYPHILIS RPR SCREEN: NONREACTIVE

## 2023-04-16 LAB — HEPATITIS C ANTIBODY: HEPATITIS C ANTIBODY: NONREACTIVE

## 2023-04-16 LAB — HIV ANTIGEN/ANTIBODY COMBO: HIV ANTIGEN/ANTIBODY COMBO: NONREACTIVE

## 2023-04-16 LAB — PHOSPHORUS: PHOSPHORUS: 3 mg/dL (ref 2.4–5.1)

## 2023-04-16 LAB — CMV IGG: CMV IGG: POSITIVE — AB

## 2023-04-16 MED ADMIN — azacitidine (VIDAZA) syringe: 75 mg/m2 | SUBCUTANEOUS | @ 18:00:00 | Stop: 2023-04-16

## 2023-04-16 MED ADMIN — ondansetron (ZOFRAN) tablet 8 mg: 8 mg | ORAL | @ 17:00:00 | Stop: 2023-04-16

## 2023-04-16 NOTE — Unmapped (Signed)
Patient arrived to chair 14 for C5 D1 of Azacitidine treatment, Treatment perimeters reviewed and premedicated. Treatment tolerated well, AVS declined and discharged home.

## 2023-04-16 NOTE — Unmapped (Signed)
You are doing great!    Proceed with the next cycle.  Azacitidine x 5 days  Venetoclax 400mg  (4 tablets) x 21 days.    Continue to work with BMT.    You will return to see Timothy Porter in 4 weeks, prior to the next cycle.

## 2023-04-16 NOTE — Unmapped (Signed)
Met with patient in clinic - provided with consent package and reviewed contents with patient. Instructed patient to bring back on consents/clearance day. Reviewed future appointments in detail with patient. Patient confirmed understanding and will call for any questions.

## 2023-04-16 NOTE — Unmapped (Signed)
[facility]ukemia Clinic Visit Note     Patient Name: Timothy Porter  Patient Age: 77 y.o.  Encounter Date: 04/16/2023    Primary Care Provider:  Pcp, None Per Patient    Referring Physician:  Virgil Benedict, AGNP  [address]  [id number]  [place],  [place] [address]    Reason for visit:  AML     Assessment:  Timothy Porter is a 77 y.o. 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  He enrolled on BAML S12 arm (ven x 28 days) and began treatment on 11/22/2022. After 1 cycle of treatment he was found to be in CR without MRD.  He has completed 4 cycles of treatment and presents to clinic today for follow up and consideration of cycle 5.     Cell counts are above threshold and he is doing well. He's appropriate to proceed with Cycle 5. He is completing FAV testing for BMT today and is considering if he wants to proceed to transplant. We discussed pros/cons of the alloSCT vs staying on aza/ven and he will continue to consider.    Free Kappa Chain MM:  He will continue follow up with MM team/[name].           Diarrhea: Resolved with increasing cholestyramine.  Stools are now formed with decrease in urgency.                                            Plan:   - proceed with cycle 5 azacitidine 75mg /m2 x 5 days + venetoclax 400mg  x 21 days.   - valtrex only for ppx.   - RTC with Oliver Hum, NP in 4 weeks for follow up and consideration of cycle 6  - FAV workup with BMT       [name] was available    Langley Gauss, AGPCNP-BC  Nurse Practitioner  Hematology/Oncology  [facility]    04/16/2023    I personally spent 40 minutes face-to-face and non-face-to-face in the care of this patient, which includes all pre, intra, and post visit time on the date of service.  All documented time was specific to the E/M visit and does not include any procedures that may have been performed.    Hematology/Oncology History Overview Note   Referring/Local Oncologist:  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      This  electronic signature is attestation that the pathologist personally reviewed the submitted material(s) and the final diagnosis reflects that evaluation.          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,XY[7]         Myeloid Mutational Panel:        Variants of Known/Likely Clinical Significance:   Gene Coding Predicted Protein Variant allele fraction   SRSF2 c.284C>G p.(Pro95Arg) 21.6 %       Treatment Timeline:  11/22/2022 Cycle 1:  BAML S12 azacitidine 75mg /m2 x 7 days + venetoclax 400mg  x 28 days  12/13/22: Bmbx - Limited, though no increase in blasts, MRD-neg by flow, poor specimen but no blasts (at <0.2%)   01/04/2023 Cycle 2: aza 75 x 7 + ven 400 x 28 days (2 week delay)  01/23/2023: BMbx: <5% blasts by CD34 immunohistochemistry, flow MRD negative  02/09/2023: Cycle 3: Aza 75 x 7 + ven 400 x 21       Multiple myeloma (CMS-HCC) 02/12/2019 Initial Diagnosis    Multiple myeloma (CMS-HCC)     05/28/2019 - 06/03/2019 Chemotherapy    BMT IP AUTO MELPHALAN  Melphalan 140 mg/m2 or 200 mg/m2 IV Day -1     10/22/2019 - 06/27/2022 Chemotherapy    STUDY [id number] IRB# [id number] LENALIDOMIDE (v. 11/04/18)  A Randomized Study of Daratumumab Plus Lenalidomide Versus Lenalidomide Alone as Maintenance Treatment in Patients with Newly Diagnosed Multiple Myeloma Who Are Minimal Residual Disease Positive After Frontline Autologous Stem Cell Transplant.     History of auto stem cell transplant (CMS-HCC) (Resolved)   08/05/2019 Initial Diagnosis    History of auto stem cell transplant (CMS-HCC)     10/22/2019 - 06/27/2022 Chemotherapy    STUDY [id number] IRB# [id number] LENALIDOMIDE (v. 11/04/18)  A Randomized Study of Daratumumab Plus Lenalidomide Versus Lenalidomide Alone as Maintenance Treatment in Patients with Newly Diagnosed Multiple Myeloma Who Are Minimal Residual Disease Positive After Frontline Autologous Stem Cell Transplant.     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  His energy is good.    He walks daily - stamina is stable. He can get dizzy so he takes his time standing before walking - that works well.  Bowel regimen is well managed with the cholestyramine  He met with BMT and is considering pros/cons.  He reports a good appetite, denies any n/v.  No new cough or sob.  No new fevers/chills.   No new complaints today.     Otherwise, he denies new constitutional symptoms such as anorexia, weight loss, fatigue, night sweats or unexplained fevers.  Furthermore, he denies unexplained bleeding or bruising, recurrent or unexplained intercurrent infections, dyspnea on exertion, lightheadedness, palpitations or chest pain.  There have been no new or unexplained pains or self-identified masses, swelling or enlarged lymph nodes.    ROS reviewed and negative except as noted in H and P     ECOG: 1    Allergies:  No Known Allergies    Medications:   Current Outpatient Medications:     calcium carbonate (TUMS) 200 mg calcium (500 mg) chewable tablet, Chew 2 tablets (400 mg of elem calcium total) Three (  3) times a day., Disp: , Rfl:     cholecalciferol, vitamin D3 25 mcg, 1,000 units,, 1,000 unit (25 mcg) tablet, Take 1 tablet (25 mcg total) by mouth., Disp: , Rfl:     cholestyramine (QUESTRAN) 4 gram packet, Take 1 packet by mouth in the morning and 1 packet in the evening. Take with meals. Mix 4 g (1 packet) into 2-3 ounces of fluid and take twice a day.., Disp: 60 packet, Rfl: 5    cyanocobalamin, vitamin B-12, 2,000 mcg Tab, Take 2,000 mcg by mouth in the morning., Disp: 90 tablet, Rfl: 3    docusate sodium (COLACE) 50 MG capsule, Take 1 capsule (50 mg total) by mouth two (2) times a day., Disp: , Rfl:     loratadine (CLARITIN) 10 mg tablet, Take 1 tablet (10 mg total) by mouth daily., Disp: , Rfl:     metoPROLOL succinate (TOPROL-XL) 50 MG 24 hr tablet, Take 1 tablet (50 mg total) by mouth daily., Disp: 30 tablet, Rfl: 11    multivitamin with minerals tablet, Take 1 tablet by mouth daily., Disp: , Rfl:     omeprazole (PRILOSEC) 40 MG capsule, TAKE 1 CAPSULE(40 MG) BY MOUTH DAILY, Disp: 90 capsule, Rfl: 0    ondansetron (ZOFRAN) 8 MG tablet, Take 1 tablet (8 mg total) by mouth every eight (8) hours as needed for nausea., Disp: 30 tablet, Rfl: 2    sildenafiL, pulm.hypertension, (REVATIO) 20 mg tablet, Take 1-5 tablets (20-100 mg total) by mouth daily as needed (for erectile function)., Disp: 90 tablet, Rfl: 3    tamsulosin (FLOMAX) 0.4 mg capsule, Take 1 capsule (0.4 mg total) by mouth daily., Disp: 90 capsule, Rfl: 0    valACYclovir (VALTREX) 500 MG tablet, Take 1 tablet (500 mg total) by mouth daily., Disp: 90 tablet, Rfl: 3    venetoclax (VENCLEXTA) 100 mg tablet, Take 4 tablets (400 mg total) by mouth daily for 21 days., Disp: 84 tablet, Rfl: 1  No current facility-administered medications for this visit.    Facility-Administered Medications Ordered in Other Visits:     DTaP-hepatitis B recombinant-IPV (PEDIARIX) 10 mcg-25Lf-25 mcg-10Lf/0.5 mL injection, , , ,     DTaP-hepatitis B recombinant-IPV (PEDIARIX) 10 mcg-25Lf-25 mcg-10Lf/0.5 mL injection, , , ,     haemophilus B polysac-tetanus toxoid (ActHIB) 10 mcg/0.5 mL injection, , , ,     influenza vaccine quad (FLUARIX, FLULAVAL, FLUZONE) (6 MOS & UP) 2020-21 ADS Med, , , ,     pneumococcal conjugate (13-valent) (PREVNAR-13) 0.5 mL vaccine, , , ,     pneumococcal conjugate (13-valent) (PREVNAR-13) 0.5 mL vaccine, , , ,     varicella-zoster gE-AS01B (PF) (SHINGRIX) 50 mcg/0.5 mL injection, , , ,     Medical History:  Past Medical History:   Diagnosis Date    Benign prostatic hyperplasia with lower urinary tract symptoms 11/27/2018    BPH (benign prostatic hyperplasia)     Diarrhea     Fatigue     Fractures     left arm    Glucose intolerance     May 2019: HbA1c 6.2.    H/O autologous stem cell transplant (CMS-HCC)     History of atrial fibrillation 03/15/2021    HTN (hypertension)     Multiple myeloma (CMS-HCC)     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CMS-HCC) 07/01/2019    CD[facility] resolved this problem based on documentation in the clinic note on date 05/04/2020, Provider Sadiq, Scherrie Gerlach, FNP, documentation CMV viremia: CMV not detected on  8/24 of 9/3, Valcyte discontinued 9/8 and resumed valacyclovir. CMV negative 04/07/20.     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  Planning to build more apartments.       Family History:  Family History   Problem Relation Age of Onset    Dementia Father     No Known Problems Other        Objective:   Vitals: 04/16/23 0745   BP: 155/85   Pulse: 80   Resp: 16   Temp: 36.9 ??C (98.5 ??F)   SpO2: 100%       Physical Exam:  General: Resting, in no apparent distress  HEENT:  PERRL. No scleral icterus or conjunctival injection.   Heart:  RRR.  S1, S2.  No murmurs, gallops or rubs. Mild non-pitting edema noted BLE.   Lungs:  Breathing is unlabored, and patient is speaking full sentences with ease.  CTAB. No rales, ronchi or crackles.    Abdomen:  No distention or pain on palpation.  Bowel sounds are present.  No palpable masses.  Skin:  No rashes, petechiae or purpura. Grossly intact.  Musculoskeletal:  strength is equal bilaterally   Psychiatric:  Range of affect is appropriate.    Neurologic:  Alert and oriented x 4.  Steady gait with assistance of cane.    Test Results:  Lab on 04/16/2023   Component Date Value Ref Range Status    LDH 04/16/2023 186  120 - 246 U/L Final    Uric Acid 04/16/2023 3.5 (L)  3.7 - 9.2 mg/dL Final    Phosphorus 09/81/1914 3.0  2.4 - 5.1 mg/dL Final    Magnesium 78/29/5621 1.7  1.6 - 2.6 mg/dL Final    Sodium 30/86/5784 142  135 - 145 mmol/L Final    Potassium 04/16/2023 3.8  3.4 - 4.8 mmol/L Final    Chloride 04/16/2023 108 (H)  98 - 107 mmol/L Final    CO2 04/16/2023 26.0  20.0 - 31.0 mmol/L Final    Anion Gap 04/16/2023 8  5 - 14 mmol/L Final    BUN 04/16/2023 17  9 - 23 mg/dL Final    Creatinine 69/62/9528 1.00  0.73 - 1.18 mg/dL Final    BUN/Creatinine Ratio 04/16/2023 17   Final    eGFR CKD-EPI (2021) Male 04/16/2023 78  >=60 mL/min/1.70m2 Final    eGFR calculated with CKD-EPI 2021 equation in accordance with [employer] and [employer] of Nephrology Task Force recommendations.    Glucose 04/16/2023 134  70 - 179 mg/dL Final    Calcium 41/32/4401 8.7  8.7 - 10.4 mg/dL Final    Albumin 02/72/5366 3.9  3.4 - 5.0 g/dL Final    Total Protein 04/16/2023 6.5  5.7 - 8.2 g/dL Final    Total Bilirubin 04/16/2023 0.6  0.3 - 1.2 mg/dL Final    AST 44/01/4741 28  <=34 U/L Final    ALT 04/16/2023 38  10 - 49 U/L Final    Alkaline Phosphatase 04/16/2023 91  46 - 116 U/L Final    WBC 04/16/2023 4.0  3.6 - 11.2 10*9/L Final    RBC 04/16/2023 3.52 (L)  4.26 - 5.60 10*12/L Final    HGB 04/16/2023 12.5 (L)  12.9 - 16.5 g/dL Final  HCT 04/16/2023 35.9 (L)  39.0 - 48.0 % Final    MCV 04/16/2023 102.2 (H)  77.6 - 95.7 fL Final    MCH 04/16/2023 35.6 (H)  25.9 - 32.4 pg Final    MCHC 04/16/2023 34.9  32.0 - 36.0 g/dL Final    RDW 16/08/9603 14.5  12.2 - 15.2 % Final    MPV 04/16/2023 7.3  6.8 - 10.7 fL Final    Platelet 04/16/2023 226  150 - 450 10*9/L Final    Neutrophils % 04/16/2023 57.4  % Final    Lymphocytes % 04/16/2023 30.1  % Final    Monocytes % 04/16/2023 12.0  % Final    Eosinophils % 04/16/2023 0.0  % Final    Basophils % 04/16/2023 0.5  % Final    Absolute Neutrophils 04/16/2023 2.3  1.8 - 7.8 10*9/L Final    Absolute Lymphocytes 04/16/2023 1.2  1.1 - 3.6 10*9/L Final    Absolute Monocytes 04/16/2023 0.5  0.3 - 0.8 10*9/L Final    Absolute Eosinophils 04/16/2023 0.0  0.0 - 0.5 10*9/L Final    Absolute Basophils 04/16/2023 0.0  0.0 - 0.1 10*9/L Final    Macrocytosis 04/16/2023 Slight (A)  Not Present Final

## 2023-04-16 NOTE — Unmapped (Signed)
Appointment on 04/16/2023   Component Date Value Ref Range Status    EKG Ventricular Rate 04/16/2023 80  BPM Incomplete    EKG Atrial Rate 04/16/2023 80  BPM Incomplete    EKG P-R Interval 04/16/2023 178  ms Incomplete    EKG QRS Duration 04/16/2023 88  ms Incomplete    EKG Q-T Interval 04/16/2023 374  ms Incomplete    EKG QTC Calculation 04/16/2023 431  ms Incomplete    EKG Calculated P Axis 04/16/2023 34  degrees Incomplete    EKG Calculated R Axis 04/16/2023 41  degrees Incomplete    EKG Calculated T Axis 04/16/2023 53  degrees Incomplete    QTC Fredericia 04/16/2023 412  ms Incomplete   Lab on 04/16/2023   Component Date Value Ref Range Status    LDH 04/16/2023 186  120 - 246 U/L Final    Uric Acid 04/16/2023 3.5 (L)  3.7 - 9.2 mg/dL Final    Phosphorus 16/08/9603 3.0  2.4 - 5.1 mg/dL Final    Magnesium 54/07/8118 1.7  1.6 - 2.6 mg/dL Final    PT 14/78/2956 10.4  9.9 - 12.6 sec Final    INR 04/16/2023 0.93   Final    APTT 04/16/2023 28.5  24.8 - 38.4 sec Final    Heparin Correlation 04/16/2023 0.2   Final    NOTE: This result is for use only with patients receiving heparin therapy in conjunction with the Heparin Nomogram.  In patients not receiving heparin, an elevated aPTT does not always imply anticoagulation.    ABO Grouping 04/16/2023 A POS   Final    Antibody Screen 04/16/2023 NEG   Final    Sodium 04/16/2023 142  135 - 145 mmol/L Final    Potassium 04/16/2023 3.8  3.4 - 4.8 mmol/L Final    Chloride 04/16/2023 108 (H)  98 - 107 mmol/L Final    CO2 04/16/2023 26.0  20.0 - 31.0 mmol/L Final    Anion Gap 04/16/2023 8  5 - 14 mmol/L Final    BUN 04/16/2023 17  9 - 23 mg/dL Final    Creatinine 21/30/8657 1.00  0.73 - 1.18 mg/dL Final    BUN/Creatinine Ratio 04/16/2023 17   Final    eGFR CKD-EPI (2021) Male 04/16/2023 78  >=60 mL/min/1.81m2 Final    eGFR calculated with CKD-EPI 2021 equation in accordance with [employer] and [employer] of Nephrology Task Force recommendations. Glucose 04/16/2023 134  70 - 179 mg/dL Final    Calcium 84/69/6295 8.7  8.7 - 10.4 mg/dL Final    Albumin 28/41/3244 3.9  3.4 - 5.0 g/dL Final    Total Protein 04/16/2023 6.5  5.7 - 8.2 g/dL Final    Total Bilirubin 04/16/2023 0.6  0.3 - 1.2 mg/dL Final    AST 11/29/7251 28  <=34 U/L Final    ALT 04/16/2023 38  10 - 49 U/L Final    Alkaline Phosphatase 04/16/2023 91  46 - 116 U/L Final    Color, UA 04/16/2023 Light Yellow   Final    Clarity, UA 04/16/2023 Clear   Final    Specific Gravity, UA 04/16/2023 1.025  1.003 - 1.030 Final    pH, UA 04/16/2023 6.0  5.0 - 9.0 Final    Leukocyte Esterase, UA 04/16/2023 Negative  Negative Final    Nitrite, UA 04/16/2023 Negative  Negative Final    Protein, UA 04/16/2023 Negative  Negative Final    Glucose, UA 04/16/2023 Negative  Negative Final  Ketones, UA 04/16/2023 Negative  Negative Final    Urobilinogen, UA 04/16/2023 <2.0 mg/dL  <0.9 mg/dL Final    Bilirubin, UA 04/16/2023 Negative  Negative Final    Blood, UA 04/16/2023 Negative  Negative Final    RBC, UA 04/16/2023 3  <=3 /HPF Final    WBC, UA 04/16/2023 2  <=2 /HPF Final    Squam Epithel, UA 04/16/2023 <1  0 - 5 /HPF Final    Bacteria, UA 04/16/2023 None Seen  None Seen /HPF Final    Mucus, UA 04/16/2023 Rare (A)  None Seen /HPF Final    WBC 04/16/2023 4.0  3.6 - 11.2 10*9/L Final    RBC 04/16/2023 3.52 (L)  4.26 - 5.60 10*12/L Final    HGB 04/16/2023 12.5 (L)  12.9 - 16.5 g/dL Final    HCT 81/19/1478 35.9 (L)  39.0 - 48.0 % Final    MCV 04/16/2023 102.2 (H)  77.6 - 95.7 fL Final    MCH 04/16/2023 35.6 (H)  25.9 - 32.4 pg Final    MCHC 04/16/2023 34.9  32.0 - 36.0 g/dL Final    RDW 29/56/2130 14.5  12.2 - 15.2 % Final    MPV 04/16/2023 7.3  6.8 - 10.7 fL Final    Platelet 04/16/2023 226  150 - 450 10*9/L Final    Neutrophils % 04/16/2023 57.4  % Final    Lymphocytes % 04/16/2023 30.1  % Final    Monocytes % 04/16/2023 12.0  % Final    Eosinophils % 04/16/2023 0.0  % Final    Basophils % 04/16/2023 0.5  % Final Absolute Neutrophils 04/16/2023 2.3  1.8 - 7.8 10*9/L Final    Absolute Lymphocytes 04/16/2023 1.2  1.1 - 3.6 10*9/L Final    Absolute Monocytes 04/16/2023 0.5  0.3 - 0.8 10*9/L Final    Absolute Eosinophils 04/16/2023 0.0  0.0 - 0.5 10*9/L Final    Absolute Basophils 04/16/2023 0.0  0.0 - 0.1 10*9/L Final    Macrocytosis 04/16/2023 Slight (A)  Not Present Final

## 2023-04-16 NOTE — Unmapped (Signed)
Timed up and go completed at 12.0 seconds. UA and VRE collected and sent to core lab. Rationale and directions for collecting these samples explained. Written material attached to MyChart. Patient verbalized understanding. Time spent 5 minutes.

## 2023-04-17 ENCOUNTER — Ambulatory Visit: Admit: 2023-04-17 | Discharge: 2023-04-18 | Payer: MEDICARE

## 2023-04-17 LAB — VARICELLA ZOSTER ANTIBODY, IGG: VARICELLA ZOSTER IGG: POSITIVE

## 2023-04-17 LAB — EPSTEIN-BARR VIRUS ANTIBODY PANEL
EPSTEIN-BARR NUCLEAR ANTIGEN AB: POSITIVE — AB
EPSTEIN-BARR VCA IGG ANTIBODY: POSITIVE — AB
EPSTEIN-BARR VCA IGM ANTIBODY: NEGATIVE

## 2023-04-17 MED ADMIN — azacitidine (VIDAZA) syringe: 75 mg/m2 | SUBCUTANEOUS | @ 13:00:00 | Stop: 2023-04-17

## 2023-04-17 MED ADMIN — ondansetron (ZOFRAN) tablet 8 mg: 8 mg | ORAL | @ 12:00:00 | Stop: 2023-04-17

## 2023-04-17 NOTE — Unmapped (Addendum)
Transplant plan and donor information:  Tx type: Allo Match: TBD  Type of match:  Blood Type:  CMV Status:    Conditioning: TBD Recipient: Blood Type: A+  CMV Status: Positive            Timed Up and Go Test: 12 seconds     6 Minute Walk Test   Distance walked: 328 meters  Oxygen saturation pre-walk/post-walk: 99/100%    Heart rate pre-walk/post-walk: 87-90%    Cardiovascular   ECHO: 60-65%. EF %    EKG: NORMAL SINUS RHYTHM  NORMAL ECG  Cardiology consult: No  Pulmonary   PFTs: FVC- 90%; FEV1- 93%, DLCOc ([place])-81.55%    CXR:   Lungs are clear.  No pleural effusion or pneumothorax.      Normal heart size and mediastinal contours.      Remote bilateral rib fractures. Right convex curvature and degenerative changes of the thoracic spine.     Caregiver plan/Concerns   Primary: Primary Wife Rebecca Back up: Back Up 1 daughter Lucio Edward,   Back up 2 son Joe    Social concerns: None    Dental Required?: Cleared  Other concerns: Patient walks with a cane and had difficulty getting up from a sitting position

## 2023-04-17 NOTE — Unmapped (Signed)
Chief concern/reason for visit:  Client here for administration of Azacitidine injection. Client completed injection without complications. Client discharged home in stable condition.     Assessment/Plan:     Client receiving treatment for the following diagnosis:  1. 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

## 2023-04-17 NOTE — Unmapped (Signed)
RED ZONE Means: RED ZONE: Take action now!     You need to be seen right away  Symptoms are at a severe level of discomfort    Call 911 or go to your nearest  Hospital for help     - Bleeding that will not stop    - Hard to breathe    - New seizure - Chest pain  - Fall or passing out  -Thoughts of hurting    yourself or others      Call 911 if you are going into the RED ZONE                  YELLOW ZONE Means:     Please call with any new or worsening symptom(s), even if not on this list.  Call [phone]  After hours, weekends, and holidays - you will reach a long recording with specific instructions, If not in an emergency such as above, please listen closely all the way to the end and choose the option that relates to your need.   You can be seen by a provider the same day through our Same Day Acute Care for Patients with Cancer program.      YELLOW ZONE: Take action today     Symptoms are new or worsening  You are not within your goal range for:    - Pain    - Shortness of breath    - Bleeding (nose, urine, stool, wound)    - Feeling sick to your stomach and throwing up    - Mouth sores/pain in your mouth or throat    - Hard stool or very loose stools (increase in       ostomy output)    - No urine for 12 hours    - Feeding tube or other catheter/tube issue    - Redness or pain at previous IV or port/catheter site    - Depressed or anxiety   - Swelling (leg, arm, abdomen,     face, neck)  - Skin rash or skin changes  - Wound issues (redness, drainage,    re-opened)  - Confusion  - Vision changes  - Fever >100.4 F or chills  - Worsening cough with mucus that is    green, yellow, or bloody  - Pain or burning when going to the    bathroom  - Home Infusion Pump Issue- call    [phone]         Call your healthcare provider if you are going into the YELLOW ZONE     GREEN ZONE Means:  Your symptoms are under controls  Continue to take your medicine as ordered  Keep all visits to the provider GREEN ZONE: You are in control  No increase or worsening symptoms  Able to take your medicine  Able to drink and eat    - DO NOT use MyChart messages to report red or yellow symptoms. Allow up to 3    business days for a reply.  -MyChart is for non-urgent medication refills, scheduling requests, or other general questions.         [id number] Rev. 05/26/2022  Approved by Oncology Patient Education Committee         No visits with results within 1 Day(s) from this visit.   Latest known visit with results is:   No results displayed because visit has over 200 results.

## 2023-04-18 ENCOUNTER — Ambulatory Visit: Admit: 2023-04-18 | Discharge: 2023-04-19 | Payer: MEDICARE

## 2023-04-18 ENCOUNTER — Encounter: Admit: 2023-04-18 | Discharge: 2023-04-19 | Payer: MEDICARE | Attending: Internal Medicine | Primary: Internal Medicine

## 2023-04-18 DIAGNOSIS — C9200 Acute myeloblastic leukemia, not having achieved remission: Principal | ICD-10-CM

## 2023-04-18 MED ADMIN — ondansetron (ZOFRAN) tablet 8 mg: 8 mg | ORAL | @ 19:00:00 | Stop: 2023-04-18

## 2023-04-18 MED ADMIN — azacitidine (VIDAZA) syringe: 75 mg/m2 | SUBCUTANEOUS | @ 20:00:00 | Stop: 2023-04-18

## 2023-04-18 NOTE — Unmapped (Signed)
SOCIAL WORK  BONE MARROW TRANSPLANT ASSESSMENT       DATE OF EVALUATION:  Apr 18, 2023     INFORMANTS: Janese Banks, patient; Review of [facility] records.     PRESENTING MEDICAL PROBLEMS AND RELEVANT HISTORY: Mr. 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     Diagnoses:  Multiple myeloma, kappa free light chain.  Diagnosed in March 2020, autologous hematopoietic cell transplantation with day 0 on 05/28/2019, followed by lenalidomide maintenance.  Bone marrow biopsy in November 2023 with less than 5% plasma cells, serum free light chain analysis in March 2024 with normal serum kappa free light chains.  Serum protein electrophoresis on 02/08/2023 with no monoclonal protein detected.  Acute myeloid leukemia.  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  He was enrolled on a clinical trial on BAML S12 arm (ven x 28 days) and began treatment on 11/22/2022.  After 1 cycle of treatment, he was found to be in a complete remission without minimal residual disease.  He has completed 3 cycles of therapy.  Current treatment is Azacytidine and venetoclax.    He is currently being considered for an allogenic bone marrow stem cell transplant. He presents today, for completion of a psychosocial assessment.      FUNCTIONAL STATUS: Mr. Elmendorf is independent in all activities of daily living and mobility.  He said he has a small limp and walks with a cane, which he uses to decrease his risk of falling. He does have access to a walker at home, that he has had to use in the past. However, he does not currently have to use the walker for ambulation, just the cane. He is able to walk about 1/2 a mile without fatigue, and typically walks about 6,000 steps in a day. He is able to drive and seems well able to make his own decisions, currently.    UNDERSTANDING OF MEDICAL CONDITION AND TRANSPLANT:  Mrs. Bassinger has a good understanding of his medical condition and the proposed transplant. This Child psychotherapist reviewed the logistical aspects of transplant including hospitalization, local area lodging, post-transplant recovery with clinic follow-up and caregiver expectations of transplant with him. He appears to be sufficiently informed about his medical condition and treatment options. [name] Herald is in agreement to receive ongoing education about the transplant process by the transplant team.      ATTITUDE ABOUT TRANSPLANT:   Mr. Sergi feels this transplant will increase his chances of living another year and that is his goal. He says, I want to still be here this time next year.     LIVING SITUATION AND SUPPORTS: Mr. Rathel lives with his wife, Lurena Joiner. Their adult daughter, Hessie Diener Skufca, and 1 dog. The family lives in a 4 bedroom/3.5 bath/1 story home at [address] [place], [place] [address]. There are 6 steps to get into the home, and no additional steps to get to the master bedroom.  Besides Mr. Hennington's wife and daughter, his son, Duwaine Maxin Wigglesworth, and his brother are also strong supports; both also live locally.     The social worker reviewed the pet policy as it pertains to  transplant. Mr. Haenel reports his family will be assisting with pet care, while he is undergoing treatment at [facility].    WHO WILL BE PATIENT'S CAREGIVER FOLLOWING TRANSPLANT:       WHERE WILL PATIENT BE STAYING FOLLOWING TRANSPLANT:    Who will be caregiver after transplant: *** , They {are/are not multi:39122}  reporting willingness and availability to be present in [facility] with patient for pre-transplant work-up, procedures, stem cell collection, and post-transplant recovery. he is aware of caregiver responsibilities and in agreement to do whatever is necessary to support Wille Celeste.  The social worker educated that a verbal confirmation from the backup caregiver is needed.        COMPLIANCE HISTORY:   ***     SUBSTANCE HISTORY: ***    PSYCHIATRIC HISTORY:  ***     COPING STYLE: ***     ADVANCE DIRECTIVES:  ***     EDUCATION AND WORK HISTORY:   ***     FINANCIAL RESOURCES:  ***     MEDICAL COVERAGE:   ***  @COVERAGEPAYOR @  @COVERAGEPLAN @       ACCESS TO TRANSPORTATION:   ***     RELIGIOUS AFFILIATION:   ***     HOME HEALTH AGENCY:  ***.     PLAN FOR TRANSPLANTATION:  ***     ASSESSMENT:  ***     EDUCATION/PLAN:  The transplant social worker discussed the transplant process, reviewed related resources, including support services and housing options in the [place] area. The transplant social worker will be available as needed throughout the evaluation process and beyond, if [name] is approved for an {bmt auto/allo:66262} stem cell transplant.     ______________________________________________    Thomasene Mohair, MSW  Bone Marrow Transplant Social Worker  [facility]  Phone Number: ***        TERS Scoring    Characteristic Weight Assessment value Total points   Prior Axis I disorder   4.0 {TERSAXIS1:47078} {Total Points:47087}   Prior Axis II disorder    4.0 {TERSAXIS2:47079} {Total Points:47087}   Substance Use/Abuse 3.0 {Substance Use/Abuse:47088} {Total points 2:47089}   Compliance 3.0 {TERSCompliance:47090} {Total points 2:47089}   Health Behaviors 2.5 {TERS Heath Behaviors:47091} {Total Points 3:47092}   Quality of Family/Social Support 2.5 {Quality of Family/Social Support:47093} {Total Points 3:47092}   Prior history of Coping 2.5 {TERS history of coping:47094} {Total Points 3:47092}   Coping with Disease and Treatment 2.5 {TERS Coping with disease:47095} {Total Points 3:47092}   Quality of Affect 1.5 {TERS Quality of Affect:47096} {Total points 4:47097}   Mental Status (past and present) 1.0 {TERS Mental Status:47098} {Total points 5:47099}               TOTAL SCORE:  *** sufficiently able to pay their monthly expenses without difficulty.     MEDICAL COVERAGE:   Mr. Mayhugh has [facility] Medicare Advantage for insurance. He expects this coverage to continue throughout his work up and recovery period.     ACCESS TO TRANSPORTATION:   Mr. Hisle has access to car and no needs were identified in this area.     RELIGIOUS AFFILIATION:   Mr. Tenny said spirituality and religion are not an important part of his life.       HOME HEALTH AGENCY:  Mr. Llanos said he previously had [facility] PT services after his hip surgery in 2016. He is willing to work with the transplant case manager to arrange any needed post-acute services.  PLAN FOR TRANSPLANTATION:  Mr. Bartsch is preparing for transplant and feels hopeful about it.     ASSESSMENT:  Mr. Michela Pitcher feels like, as a family, they are in the best shape they can be prior to transplant. He appears to have the internal and external resources to support him through the transplant process. He has strong family support locally and does not require CCSP at this time.       EDUCATION/PLAN:  The transplant social worker discussed the transplant process, reviewed related resources, including support services and housing options in the [place] area. The transplant social worker will be available as needed throughout the evaluation process and beyond, if [name] is approved for an Allogeneic stem cell transplant.     ______________________________________________    Thomasene Mohair, MSW, LCSW  Bone Marrow Transplant Social Worker  [facility]  Phone Number: [phone]        TERS Scoring    Characteristic Weight Assessment value Total points   Prior Axis I disorder   4.0 1 - NONE 4   Prior Axis II disorder    4.0 1 - NONE 4   Substance Use/Abuse 3.0 1- No history of heavy use/abuse of alcohol or drugs; or true social drinking; or limited drug experimentation 3   Compliance 3.0 1- Appropriately compliant throughout treatment 3   Health Behaviors 2.5 1- Practiced good health behaviors (exercise, no tobacco, diet, etc) prior to illness  2.5   Quality of Family/Social Support 2.5 1- Good-Excellent: friends/family members present and available; willing to focus on patient's needs 2.5   Prior history of Coping 2.5 1- Good-Excellent: Adapts to problems and changes flexibly; has extensive repertoire of coping behaviors 2.5   Coping with Disease and Treatment 2.5 1-Resolution of feelings about diagnosis; considers treatment options with realistic balance of hope and concern for future 2.5   Quality of Affect 1.5 1-Appropriate fears; some anxiety; appropriate sadness 1.5   Mental Status (past and present) 1.0 1-No cognitive impairment or disorder of attention; normal sleep-wake cycle; normal activity level and responsiveness 1               TOTAL SCORE:  26.5

## 2023-04-18 NOTE — Unmapped (Signed)
Premeds given per order. Azacitidine administered per treatment plan. Patient tolerated well and left infusion center ambulatory.

## 2023-04-19 ENCOUNTER — Ambulatory Visit: Admit: 2023-04-19 | Discharge: 2023-04-20 | Payer: MEDICARE

## 2023-04-19 LAB — COMPREHENSIVE METABOLIC PANEL
ALBUMIN: 3.8 g/dL (ref 3.4–5.0)
ALKALINE PHOSPHATASE: 90 U/L (ref 46–116)
ALT (SGPT): 37 U/L (ref 10–49)
ANION GAP: 5 mmol/L (ref 5–14)
AST (SGOT): 38 U/L — ABNORMAL HIGH (ref <=34)
BILIRUBIN TOTAL: 0.8 mg/dL (ref 0.3–1.2)
BLOOD UREA NITROGEN: 21 mg/dL (ref 9–23)
BUN / CREAT RATIO: 24
CALCIUM: 9.2 mg/dL (ref 8.7–10.4)
CHLORIDE: 108 mmol/L — ABNORMAL HIGH (ref 98–107)
CO2: 27 mmol/L (ref 20.0–31.0)
CREATININE: 0.89 mg/dL
EGFR CKD-EPI (2021) MALE: 89 mL/min/{1.73_m2} (ref >=60)
GLUCOSE RANDOM: 118 mg/dL (ref 70–179)
POTASSIUM: 3.9 mmol/L (ref 3.4–4.8)
PROTEIN TOTAL: 6.8 g/dL (ref 5.7–8.2)
SODIUM: 140 mmol/L (ref 135–145)

## 2023-04-19 LAB — CBC W/ AUTO DIFF
BASOPHILS ABSOLUTE COUNT: 0 10*9/L (ref 0.0–0.1)
BASOPHILS RELATIVE PERCENT: 0.3 %
EOSINOPHILS ABSOLUTE COUNT: 0 10*9/L (ref 0.0–0.5)
EOSINOPHILS RELATIVE PERCENT: 0 %
HEMATOCRIT: 36 % — ABNORMAL LOW (ref 39.0–48.0)
HEMOGLOBIN: 12.8 g/dL — ABNORMAL LOW (ref 12.9–16.5)
LYMPHOCYTES ABSOLUTE COUNT: 1.1 10*9/L (ref 1.1–3.6)
LYMPHOCYTES RELATIVE PERCENT: 32.6 %
MEAN CORPUSCULAR HEMOGLOBIN CONC: 35.6 g/dL (ref 32.0–36.0)
MEAN CORPUSCULAR HEMOGLOBIN: 35.9 pg — ABNORMAL HIGH (ref 25.9–32.4)
MEAN CORPUSCULAR VOLUME: 100.9 fL — ABNORMAL HIGH (ref 77.6–95.7)
MEAN PLATELET VOLUME: 7.2 fL (ref 6.8–10.7)
MONOCYTES ABSOLUTE COUNT: 0.5 10*9/L (ref 0.3–0.8)
MONOCYTES RELATIVE PERCENT: 15.7 %
NEUTROPHILS ABSOLUTE COUNT: 1.7 10*9/L — ABNORMAL LOW (ref 1.8–7.8)
NEUTROPHILS RELATIVE PERCENT: 51.4 %
PLATELET COUNT: 153 10*9/L (ref 150–450)
RED BLOOD CELL COUNT: 3.57 10*12/L — ABNORMAL LOW (ref 4.26–5.60)
RED CELL DISTRIBUTION WIDTH: 14.2 % (ref 12.2–15.2)
WBC ADJUSTED: 3.3 10*9/L — ABNORMAL LOW (ref 3.6–11.2)

## 2023-04-19 MED ADMIN — azacitidine (VIDAZA) syringe: 75 mg/m2 | SUBCUTANEOUS | @ 14:00:00 | Stop: 2023-04-19

## 2023-04-19 MED ADMIN — ondansetron (ZOFRAN) tablet 8 mg: 8 mg | ORAL | @ 14:00:00 | Stop: 2023-04-19

## 2023-04-19 NOTE — Unmapped (Signed)
Patient tolerated injections well with NAD noted. Departed to home/self care ambulatory with cane.

## 2023-04-20 ENCOUNTER — Ambulatory Visit: Admit: 2023-04-20 | Discharge: 2023-04-21 | Payer: MEDICARE

## 2023-04-20 LAB — HTLV I/II ANTIBODY: HTLV I/II ANTIBODIES: NEGATIVE

## 2023-04-20 MED ADMIN — ondansetron (ZOFRAN) tablet 8 mg: 8 mg | ORAL | @ 13:00:00 | Stop: 2023-04-20

## 2023-04-20 MED ADMIN — azacitidine (VIDAZA) syringe: 75 mg/m2 | SUBCUTANEOUS | @ 13:00:00 | Stop: 2023-04-20

## 2023-04-20 NOTE — Unmapped (Signed)
Pt in chair 5, here for chemo     Tolerated injection right abd. Denies reaction    AVS declined   Ambulatory for dc home

## 2023-04-20 NOTE — Unmapped (Signed)
No visits with results within 1 Day(s) from this visit.   Latest known visit with results is:   Hospital Outpatient Visit on 04/19/2023   Component Date Value Ref Range Status    Sodium 04/19/2023 140  135 - 145 mmol/L Final    Potassium 04/19/2023 3.9  3.4 - 4.8 mmol/L Final    Chloride 04/19/2023 108 (H)  98 - 107 mmol/L Final    CO2 04/19/2023 27.0  20.0 - 31.0 mmol/L Final    Anion Gap 04/19/2023 5  5 - 14 mmol/L Final    BUN 04/19/2023 21  9 - 23 mg/dL Final    Creatinine 57/84/6962 0.89  0.73 - 1.18 mg/dL Final    BUN/Creatinine Ratio 04/19/2023 24   Final    eGFR CKD-EPI (2021) Male 04/19/2023 89  >=60 mL/min/1.44m2 Final    eGFR calculated with CKD-EPI 2021 equation in accordance with [employer] and [employer] of Nephrology Task Force recommendations.    Glucose 04/19/2023 118  70 - 179 mg/dL Final    Calcium 95/28/4132 9.2  8.7 - 10.4 mg/dL Final    Albumin 44/11/270 3.8  3.4 - 5.0 g/dL Final    Total Protein 04/19/2023 6.8  5.7 - 8.2 g/dL Final    Total Bilirubin 04/19/2023 0.8  0.3 - 1.2 mg/dL Final    AST 53/66/4403 38 (H)  <=34 U/L Final    ALT 04/19/2023 37  10 - 49 U/L Final    Alkaline Phosphatase 04/19/2023 90  46 - 116 U/L Final    WBC 04/19/2023 3.3 (L)  3.6 - 11.2 10*9/L Final    RBC 04/19/2023 3.57 (L)  4.26 - 5.60 10*12/L Final    HGB 04/19/2023 12.8 (L)  12.9 - 16.5 g/dL Final    HCT 47/42/5956 36.0 (L)  39.0 - 48.0 % Final    MCV 04/19/2023 100.9 (H)  77.6 - 95.7 fL Final    MCH 04/19/2023 35.9 (H)  25.9 - 32.4 pg Final    MCHC 04/19/2023 35.6  32.0 - 36.0 g/dL Final    RDW 38/75/6433 14.2  12.2 - 15.2 % Final    MPV 04/19/2023 7.2  6.8 - 10.7 fL Final    Platelet 04/19/2023 153  150 - 450 10*9/L Final    Neutrophils % 04/19/2023 51.4  % Final    Lymphocytes % 04/19/2023 32.6  % Final    Monocytes % 04/19/2023 15.7  % Final    Eosinophils % 04/19/2023 0.0  % Final    Basophils % 04/19/2023 0.3  % Final    Absolute Neutrophils 04/19/2023 1.7 (L)  1.8 - 7.8 10*9/L Final    Absolute Lymphocytes 04/19/2023 1.1  1.1 - 3.6 10*9/L Final    Absolute Monocytes 04/19/2023 0.5  0.3 - 0.8 10*9/L Final    Absolute Eosinophils 04/19/2023 0.0  0.0 - 0.5 10*9/L Final    Absolute Basophils 04/19/2023 0.0  0.0 - 0.1 10*9/L Final    Macrocytosis 04/19/2023 Slight (A)  Not Present Final

## 2023-04-23 ENCOUNTER — Encounter: Admit: 2023-04-23 | Discharge: 2023-04-23 | Attending: Internal Medicine | Primary: Internal Medicine

## 2023-04-23 ENCOUNTER — Encounter: Admit: 2023-04-23 | Discharge: 2023-04-23 | Payer: MEDICARE | Attending: Internal Medicine | Primary: Internal Medicine

## 2023-04-23 DIAGNOSIS — C9200 Acute myeloblastic leukemia, not having achieved remission: Principal | ICD-10-CM

## 2023-04-23 MED ORDER — VENETOCLAX 100 MG TABLET
ORAL_TABLET | Freq: Every day | ORAL | 4 refills | 21 days | Status: CP
Start: 2023-04-23 — End: 2023-05-14
  Filled 2023-04-26: qty 84, 28d supply, fill #0

## 2023-04-24 ENCOUNTER — Encounter: Admit: 2023-04-24 | Discharge: 2023-04-24 | Payer: MEDICARE | Attending: Internal Medicine | Primary: Internal Medicine

## 2023-04-24 ENCOUNTER — Encounter: Admit: 2023-04-24 | Discharge: 2023-04-24 | Attending: Internal Medicine | Primary: Internal Medicine

## 2023-04-25 ENCOUNTER — Encounter: Admit: 2023-04-25 | Discharge: 2023-04-25 | Payer: MEDICARE | Attending: Internal Medicine | Primary: Internal Medicine

## 2023-04-25 ENCOUNTER — Ambulatory Visit
Admit: 2023-04-25 | Discharge: 2023-04-26 | Payer: MEDICARE | Attending: Student in an Organized Health Care Education/Training Program | Primary: Student in an Organized Health Care Education/Training Program

## 2023-04-25 NOTE — Unmapped (Signed)
FUNCTIONAL ASSESSMENT REVIEW    Timothy Porter  77 y.o. 10-Dec-1945   BMT MD/APP:  Wonda Cheng      Referring MD: [name]     Identifying Statement: Timothy Porter is a 77 y.o. 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     History of Present Illness: (taken from Dr. 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 Multiple myeloma, kappa free light chain.  Diagnosed in March 2020, autologous hematopoietic cell transplantation with day 0 on 05/28/2019, followed by lenalidomide maintenance.  Bone marrow biopsy in November 2023 with less than 5% plasma cells, serum free light chain analysis in March 2024 with normal serum kappa free light chains.  Serum protein electrophoresis on 02/08/2023 with no monoclonal protein detected.    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  He was enrolled on a clinical trial on BAML S12 arm (ven x 28 days) and began treatment on 11/22/2022.  After 1 cycle of treatment, he was found to be in a complete remission without minimal residual disease.  He has completed 3 cycles of therapy.  Current treatment is Azacytidine     He presents today for review of his FAV testing. He reports feeling fine without any acute concerns. He feels well informed about transplant given he has already had an auto BMT in 2020. He reports his biggest complaint is fatigue. He does walk at least 20 mins a day with his cane and goes up and down hills daily. He also waters his garden every day. He is slow to get up out of a chair but once up is active. He has had 2 episodes of dizziness in the last few months. He passed out one time and fell, landing on his buttocks. He attributes both episodes to dehydration. He also has chronic constipation / diarrhea which he manages with diet (avoids tomato's) and takes Questran. He is afebrile and denies infectious complaints.     Past Medical History:   Diagnosis Date    Benign prostatic hyperplasia with lower urinary tract symptoms 11/27/2018    BPH (benign prostatic hyperplasia)     Diarrhea     Fatigue     Fractures     left arm    Glucose intolerance     May 2019: HbA1c 6.2.    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 CMV negative 04/07/20.     Pancytopenia (CMS-HCC) 03/14/2023    Prediabetes         Current Outpatient Medications   Medication Sig Dispense Refill    calcium carbonate (TUMS) 200 mg calcium (500 mg) chewable tablet Chew 2 tablets (400  mg of elem calcium total) Three (3) times a day.      cholecalciferol, vitamin D3 25 mcg, 1,000 units,, 1,000 unit (25 mcg) tablet Take 1 tablet (25 mcg total) by mouth.      cholestyramine (QUESTRAN) 4 gram packet Take 1 packet by mouth in the morning and 1 packet in the evening. Take with meals. Mix 4 g (1 packet) into 2-3 ounces of fluid and take twice a day.. 60 packet 5    cyanocobalamin, vitamin B-12, 2,000 mcg Tab Take 2,000 mcg by mouth in the morning. 90 tablet 3    docusate sodium (COLACE) 50 MG capsule Take 1 capsule (50 mg total) by mouth two (2) times a day.      loratadine (CLARITIN) 10 mg tablet Take 1 tablet (10 mg total) by mouth daily.      metoPROLOL succinate (TOPROL-XL) 50 MG 24 hr tablet Take 1 tablet (50 mg total) by mouth daily. 30 tablet 11    multivitamin with minerals tablet Take 1 tablet by mouth daily.      omeprazole (PRILOSEC) 40 MG capsule TAKE 1 CAPSULE(40 MG) BY MOUTH DAILY 90 capsule 0    ondansetron (ZOFRAN) 8 MG tablet Take 1 tablet (8 mg total) by mouth every eight (8) hours as needed for nausea. 30 tablet 2    sildenafiL, pulm.hypertension, (REVATIO) 20 mg tablet Take 1-5 tablets (20-100 mg total) by mouth daily as needed (for erectile function). 90 tablet 3    tamsulosin (FLOMAX) 0.4 mg capsule Take 1 capsule (0.4 mg total) by mouth daily. 90 capsule 0    valACYclovir (VALTREX) 500 MG tablet Take 1 tablet (500 mg total) by mouth daily. 90 tablet 3    venetoclax (VENCLEXTA) 100 mg tablet Take 4 tablets (400 mg total) by mouth daily for 21 days. 84 tablet 4     No current facility-administered medications for this visit.     Facility-Administered Medications Ordered in Other Visits   Medication Dose Route Frequency Provider Last Rate Last Admin    DTaP-hepatitis B recombinant-IPV (PEDIARIX) 10 mcg-25Lf-25 mcg-10Lf/0.5 mL injection             DTaP-hepatitis B recombinant-IPV (PEDIARIX) 10 mcg-25Lf-25 mcg-10Lf/0.5 mL injection             haemophilus B polysac-tetanus toxoid (ActHIB) 10 mcg/0.5 mL injection             influenza vaccine quad (FLUARIX, FLULAVAL, FLUZONE) (6 MOS & UP) 2020-21 ADS Med             pneumococcal conjugate (13-valent) (PREVNAR-13) 0.5 mL vaccine             pneumococcal conjugate (13-valent) (PREVNAR-13) 0.5 mL vaccine             varicella-zoster gE-AS01B (PF) (SHINGRIX) 50 mcg/0.5 mL injection              No Known Allergies    Review of Systems:  Constitutional: Denies fever, chills, sweats, unexplained weight loss   Eyes: Denies vision changes, eye pain, eye redness +wears glasses   ENT: Denies headaches, sore throat, hoarseness, sneezing, vertigo, hearing loss, tinnitus, neck pain, stiffness, dizziness, tooth pain, gum pain, mouth ulcers   Skin: Denies rashes, sores, jaundice, itching, dryness   Cardiovascular: Denies chest pain, shortness of breath (at rest or with exertion), edema +dizziness x2 in the past few months   Pulmonary: Denies chest congestion, cough, SOB  Endocrine: Denies polyuria, polydipsia, heat/cold intolerance   Gastrointestinal/Abdomen: Denies  heartburn, early satiety, nausea, vomiting, abdominal pain, blood per rectum +chronic constipation and diarrhea  Genito-urinary: Denies frequency, urgency, dysuria, hematuria, nocturia   Musculoskeletal/Extremities: Denies myalgias, arthralgias, joint swelling, joint pain   Neurologic:  Denies numbness, tingling, weakness, changes in gait, confusion, slurred speech   Psychology:  Denies change in mood, insomnia   Heme/Lymph: As per HPI/PMH      Physical Exam:  General : No acute distress noted.   ENT: Moist mucous membranes. Oropharhynx without lesions, erythema or exudate.   Cardiovascular: Pulse normal rate, regularity and rhythm. S1 and S2 normal, without any murmur, rub, or gallop.  Lungs: Clear to auscultation bilaterally, without wheezes/crackles/rhonchi. Good air movement.   Skin: Warm, dry, intact. No rash noted.   Psychiatry: Alert and oriented to person, place, and time.   GI : Normoactive bowel sounds, abdomen soft, non-tender   Extremeties: No edema.   Musculo Skeletal: Full range of motion in shoulder, elbow, hip knee, ankle, left hand and feet.  Neurologic: CNII-XII intact. Normal strength and sensation throughout    Assessment and Plan:  Diagnosis: AML  Timothy Porter [id number] 77 y.o. February 08, 1946   BMT MD/APP:  Wood/Kenika Sahm      Disease status: CR (complete remission)     Transplant plan and donor information:  Tx type: Allo Match: TBD   Conditioning: TBD Recipient: Blood Type: A+;CMV Status: positive        Comorbidity Index Score:   aaHCT-CI Score= 2  80, Normal activity with effort; some signs or symptoms of disease (ECOG equivalent 1)   (1) Arrhythmia 1 afib   (1) Cardiac  cad/chf/mi/ef<50   (1) CVA  tia/cva   (1) Diabetes  treatment   (3) Heart Valve     (1) Hepa Mild  bili<1.5/ast<2.5   (3) Hepa Severe  bili>1.5/ast>2.5   (1) Infection  abx s/p day 0   (1) Inflam Bowel Disease     (1) Obesity  BMI > 35   (2) Peptic ulcer  treatment   (1) Psych  treatment   (2) Pulm mod  Dlco and/or FEV1 66-80% or dyspnea on slight activity   (3) Pulm severe  dlco <65 or dyspnea at rest or requiring oxygen   (2) Renal  CR>2   (2) Rheum  SLE/RA   (3) Solid tumor  treat xskin   Other Co-morbid     Age 47 and  Greater 1     Timed Up and Go Test: 12 seconds     6 Minute Walk Test   Distance walked: 328 meters  Oxygen saturation pre-walk/post-walk: 99/100%    Heart rate pre-walk/post-walk: 87-90  Cardiovascular   ECHO: EF 60-65% grade I diastolic dysfunction  EKG: NSR  Cardiology consult: No    Pulmonary   PFTs: FVC-90%; FEV1-93%, DLCOc ([place])-81.55%  CXR: Lungs are clear  Caregiver plan/Concerns   Primary: Wife - Lurena Joiner (retired Education officer, community) Back up: Daughter - Scientist, physiological (lives at home with Lurena Joiner and [place] and is not currently working) Son - Child psychotherapist (Anesthesiologist in [place])    TERS score: 26.5  Social concerns: Psych or CSSP referral - no  Complex case: no     Other concerns/referrals/consults needed: no    Dental: Cleared    Other concerns: Patient walks with a cane and had difficulty getting up from a sitting position but walks 20 mins a day. Walks up and down hills without issues. Has had 2 episodes of dizziness in the last several months, fell once because of it. He  attributed it to dehydration.      Today we reviewed the results of the Echo, PFTs, Caregiver plans, Dental Clearance.     Other co morbidities: NA    Referrals needed: NA    Potential studies: Is the patient eligible for any of the studies below No.. If yes, please contact Madelin Rear or Brice Njobe.    Currently Open Studies - Pre-HCT   Short Name/  PI Title/ Contact Eligibility - Inclusion Exclusions Timing/ Special   ACCESS    Oswald Hillock A Multi-Center, Phase II Trial of HLA-Mismatched Unrelated Donor Hematopoietic Cell Transplantation with Post-Transplantation Cyclophosphamide for Patients with Hematologic Malignancies    Madelin Rear [phone]   Eligible for 1st allo-HCT  Peds MAC BM (stratum 3) ->1 to ?76yo, (9 spots left)  Stratum specific - MDS, AL, CML, CLL, lymphoma  MMUD - down to 4/8 Prior allo (prior auto >10mon is ok)  MF     Stratum 1and 2 closed - all adults   Must check Ab and run ACCESS search strategy report   Optimize    Oswald Hillock    To open mid-May     A Phase II Study of Reduced Dose Post Transplantation Cyclophosphamide as GvHD Prophylaxis in Adult Patients with Hematologic Malignancies Receiving HLA-Mismatched Unrelated Donor Peripheral Blood Stem Cell Transplantation    Madelin Rear [phone]   Stratum 1 = 18-66yo with AML, ALL, AL, MDS - MAC  Stratum 2 = ?76yo with Acute or chronic leukemia, CML, MDS, CLL, CMML, lymphoma - NMA/ RIC   Stratum 3 = ?76yo with MF - NMA/ RIC  MMUD - down to 4/8 Bone Marrow graft  Full match - MRD or MUD Must check Ab and run Optimize search strategy report   TSCAN    [place]   A Controlled Multi-Arm Phase 1 Umbrella Study Evaluating the Safety and Feasibility of T-Cell Receptor Engineered Donor T-Cells Targeting HA-1 (TSC-100) or HA-2 (TSC-101) in HLA-A*02:01 Positive Patients Undergoing Haploidentical Allogeneic Peripheral Blood Stem Cell Transplantation    Madelin Rear [phone]   ?77yo  ?77yo Haplo related donor  AML, MDS, ALL  RIC, PBSC  HLA-A *02:01 TSC-101, but HLA-A*02:07 positive  Prio allo 1st leukapheresis is for study  Must agree to 63yr follow up study  Present/ consent around FAV    Observational      IGHID [id number]    [place]dermann The Gut Microbiome and Antibiotic Resistome in Patients with Hematologic Malignancies    Concha Se [phone]/ [facility] [phone]/ Miguel Aschoff [phone]   ?77yo  Hem malignancy  Allo, auto Prior gastric bypass or gut altering surgery First sample due prior to start of conditioning   NMDP Sample Repository    [place] NMDP Research Sample Repository: National Marrow Donor Program Repository Database  (bank for future research)    BMT and Donor Coordinators Donor and recipient both consent    Recipient - prior to conditioning for HCT  Donor - any time   CIBMTR Data Registry    [place] Participation in the [place] Marrow Donor Program and Center for International Blood and Marrow Transplant Research Registry for Patients Undergoing Matched Related or Unrelated Donor Transplants    BMT and Donor Coordinators Donor and recipient both consent    Data Registry for all transplant patients       Currently Open Studies - Post-HCT  Short Name/  PI Title/ Contact Eligibility - Inclusion Exclusions Timing/ Special    Acute GVHD      Reymundo Poll  A Phase 3, Randomized, Double-Blind, Placebo-Controlled Multicenter Study of Itolizumab in Combination with Corticosteroids for the Initial Treatment of Acute Graft Versus Host Disease (treat new onset aGVHD)    Danella Sensing Njobe [phone]/ Madelin Rear [phone]   post first allo HCT, any conditioning regimen  ?77yo and >40kg    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 [phone]/ Concha Se [phone]       ?77yo  Post Allo - 90 - 180 days  Willing to complete surveys   None   Monthly surveys for 68yr, then every other month for 2 years  Paid $25/month      Other Disease Processes            We discussed that these results will be presented at our BMT/Cell therapy team meeting by the patients transplant physician. The patients BMT coordinator will be in touch for next steps and with information about follow up visit. If questions arise before the next visit please contact the transplant coordinator for assistance.     Royexpressed understanding and agreement of the information provided and discussed today. All of the questions were answered to his satisfaction.     Mitzi Hansen, AGNP  [facility]    I personally spent 60 minutes face-to-face and non-face-to-face in the care of this patient, which includes all pre, intra, and post visit time on the date of service.

## 2023-04-25 NOTE — Unmapped (Signed)
[facility] Refill Coordination Note    Specialty Medication(s) to be Shipped:   Hematology/Oncology: Venclexta 100mg     Other medication(s) to be shipped: No additional medications requested for fill at this time     Timothy Porter, DOB: 27-Nov-1946  Phone: [phone] (home)       All above HIPAA information was verified with patient.     Was a Nurse, learning disability used for this call? No    Completed refill call assessment today to schedule patient's medication shipment from the [facility] [phone]).  All relevant notes have been reviewed.     Specialty medication(s) and dose(s) confirmed: Regimen is correct and unchanged.   Changes to medications: Floid reports no changes at this time.  Changes to insurance: No  New side effects reported not previously addressed with a pharmacist or physician: None reported  Questions for the pharmacist: No    Confirmed patient received a Conservation officer, historic buildings and a Surveyor, mining with first shipment. The patient will receive a drug information handout for each medication shipped and additional FDA Medication Guides as required.       DISEASE/MEDICATION-SPECIFIC INFORMATION        N/A    SPECIALTY MEDICATION ADHERENCE     Medication Adherence    Patient reported X missed doses in the last month: 0  Specialty Medication: Venclexta 100mg   Patient is on additional specialty medications: No  Informant: patient     Were doses missed due to medication being on hold? No    Venclexta 100 mg: 6 days of medicine on hand.        REFERRAL TO PHARMACIST     Referral to the pharmacist: Not needed      [facility]     Shipping address confirmed in Epic.       Delivery Scheduled: Yes, Expected medication delivery date: 04/26/23.     Medication will be delivered via Next Day Courier to the prescription address in Epic [place].    Wyatt Mage M Elisabeth Cara   [facility] Specialty Technician

## 2023-04-26 DIAGNOSIS — Z005 Encounter for examination of potential donor of organ and tissue: Principal | ICD-10-CM

## 2023-04-29 MED ORDER — SILDENAFIL (PULMONARY HYPERTENSION) 20 MG TABLET
ORAL_TABLET | Freq: Every day | ORAL | 3 refills | 18 days | PRN
Start: 2023-04-29 — End: ?

## 2023-04-30 ENCOUNTER — Encounter: Admit: 2023-04-30 | Discharge: 2023-04-30 | Payer: MEDICARE | Attending: Internal Medicine | Primary: Internal Medicine

## 2023-04-30 DIAGNOSIS — Z7682 Awaiting organ transplant status: Principal | ICD-10-CM

## 2023-04-30 DIAGNOSIS — Z005 Encounter for examination of potential donor of organ and tissue: Principal | ICD-10-CM

## 2023-04-30 MED ORDER — SILDENAFIL (PULMONARY HYPERTENSION) 20 MG TABLET
ORAL_TABLET | Freq: Every day | ORAL | 3 refills | 18 days | PRN
Start: 2023-04-30 — End: ?

## 2023-04-30 NOTE — Unmapped (Signed)
Called pt to schedule an appointment for med refill and pt declined. Pt upset that request was sent to our office and he is seeing several Cancer/Specialists doctors at this time. He will reach out to his Oncologist for this request and hung up

## 2023-04-30 NOTE — Unmapped (Signed)
Patient is requesting the following refill  Requested Prescriptions     Pending Prescriptions Disp Refills    sildenafiL, pulm.hypertension, (REVATIO) 20 mg tablet 90 tablet 3     Sig: Take 1-5 tablets (20-100 mg total) by mouth daily as needed (for erectile function).       Recent Visits  No visits were found meeting these conditions.  Showing recent visits within past 365 days with a meds authorizing provider and meeting all other requirements  Future Appointments  No visits were found meeting these conditions.  Showing future appointments within next 365 days with a meds authorizing provider and meeting all other requirements       Labs: Not applicable this refill

## 2023-05-01 MED ORDER — SILDENAFIL (PULMONARY HYPERTENSION) 20 MG TABLET
ORAL_TABLET | Freq: Every day | ORAL | 3 refills | 18.00000 days | Status: CP | PRN
Start: 2023-05-01 — End: ?

## 2023-05-01 NOTE — Unmapped (Signed)
Spoke with patient and let him know that his case was discussed yesterday with the BMT team and they recommend that he be seen by Dr. Landry Dyke to evaluate patient since he had difficulty getting up and uses cane while walking. Patient confirmed understanding.

## 2023-05-02 DIAGNOSIS — N529 Male erectile dysfunction, unspecified: Principal | ICD-10-CM

## 2023-05-02 DIAGNOSIS — Z005 Encounter for examination of potential donor of organ and tissue: Principal | ICD-10-CM

## 2023-05-02 NOTE — Unmapped (Signed)
Physical Medicine and Rehab  Clinic Note          Chief Complaint: Mobility impairment    History of Present Illness: Timothy Porter is a 77 y.o. male with a past medical history of AML s/p ven, azacytidine along with multiple myeloma s/p autoSCT, lenalidomide maintenance presenting with mobility impairment.    Per review of EMR, he has reported ongoing fatigue and uses a SPC. He has had dizziness and one episode of syncope. He has difficulty with transitional movements.    Today he confirms that he has trouble with transitional movements and requires the use of arm rests to be able to transition sit to stand. He is active and is independent with IADLs. He walks 20 minutes per day. He used to cycle regularly but has been unable to for some time and is not interested in stationary biking. He uses a [facility] for community ambulation. He has participated in formal PT in the past but is not interested in returning to formal PT. Instead, he has a local neighbor who is in PT school who works with him on a regular basis.     He has not experienced any further episodes of dizziness. He is drinking water regularly. He does experience fatigue but states it is not as severe as it was in the past as it is now more predictable. He will nap 15-20 minutes and wake up feeling refreshed. He sleeps 6-8 hours per night and feels rested in the mornings. He is eating protein at every meal. He does report some balance issues and has had one fall recently.    He denies other symptoms today.    Cancer History:  Hematology/Oncology History Overview Note   Referring/Local Oncologist:  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      This electronic signature is attestation that the pathologist personally reviewed the submitted material(s) and the final diagnosis reflects that evaluation.          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,XY[7]         Myeloid Mutational Panel:        Variants of Known/Likely Clinical Significance:   Gene Coding Predicted Protein Variant allele fraction   SRSF2 c.284C>G p.(Pro95Arg) 21.6 %       Treatment Timeline:  11/22/2022 Cycle 1:  BAML S12 azacitidine 75mg /m2 x 7 days + venetoclax 400mg  x 28 days  12/13/22: Bmbx - Limited, though no increase in blasts, MRD-neg by flow, poor specimen but no blasts (at <0.2%)   01/04/2023 Cycle 2: aza 75 x 7 + ven 400 x 28 days (2 week delay)  01/23/2023: BMbx: <5% blasts by CD34 immunohistochemistry, flow MRD negative  02/09/2023: Cycle 3: Aza 75 x 7 + ven 400 x 21  03/19/2023: Cycle 4 Aza 75 x 5 + ven 400 x 21 (Aza decreased  due to delayed count recovery)  04/16/2023: Cycle 5: Aza 75 x 5 + ven 400 x 21     Multiple myeloma (CMS-HCC)   02/12/2019 Initial Diagnosis    Multiple myeloma (CMS-HCC)     05/28/2019 - 06/03/2019 Chemotherapy    BMT IP AUTO MELPHALAN  Melphalan 140 mg/m2 or 200 mg/m2 IV Day -1     10/22/2019 - 06/27/2022 Chemotherapy    STUDY [id number] IRB# [id number] LENALIDOMIDE (v. 11/04/18)  A Randomized Study of Daratumumab Plus Lenalidomide Versus Lenalidomide Alone as Maintenance Treatment in Patients with Newly Diagnosed Multiple Myeloma Who Are Minimal Residual Disease Positive After Frontline Autologous Stem Cell Transplant.     History of auto stem cell transplant (CMS-HCC) (Resolved)   08/05/2019 Initial Diagnosis    History of auto stem cell transplant (CMS-HCC)     10/22/2019 - 06/27/2022 Chemotherapy    STUDY [id number] IRB# [id number] LENALIDOMIDE (v. 11/04/18)  A Randomized Study of Daratumumab Plus Lenalidomide Versus Lenalidomide Alone as Maintenance Treatment in Patients with Newly Diagnosed Multiple Myeloma Who Are Minimal Residual Disease Positive After Frontline Autologous Stem Cell Transplant.     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 CMV negative 04/07/20.     Pancytopenia (CMS-HCC) 03/14/2023    Prediabetes      Past Surgical History:   Procedure Laterality Date    FRACTURE SURGERY      HERNIA REPAIR      HIP SURGERY      JOINT REPLACEMENT      PR COLONOSCOPY W/BIOPSY SINGLE/MULTIPLE N/A 01/01/2018    Procedure: COLONOSCOPY, FLEXIBLE, PROXIMAL TO SPLENIC FLEXURE; WITH BIOPSY, SINGLE OR MULTIPLE;  Surgeon: Bronson Curb, MD;  Location: [facility];  Service: Gastroenterology    PR IMPLANT MESH HERNIA REPAIR/DEBRIDEMENT CLOSURE  05/07/2017    Procedure: implantation of mesh;  Surgeon: Judithann Graves, MD;  Location: MAIN OR [facility];  Service: Trauma    PR REPAIR ING HERNIA,5+Y/O,REDUCIBL Left 05/07/2017 Procedure: Repair direct and indirect inguinal hernia with mesh;  Surgeon: Judithann Graves, MD;  Location: MAIN OR [facility];  Service: Trauma    PR TOTAL HIP ARTHROPLASTY Right 10/14/2015    Procedure: ARTHROPLASTY, ACETABULAR & PROXIMAL FEMORAL PROSTHETIC REPLACEMENT (TOTAL HIP), W/WO AUTOGRAFT/ALLOGRAFT;  Surgeon: Malka So, MD;  [facility];  Service: Orthopedics    VARICOSE VEIN SURGERY Right         Social History:   Social History     Tobacco Use    Smoking status:  Never    Smokeless tobacco: Never   Vaping Use    Vaping status: Never Used   Substance Use Topics    Alcohol use: Yes     Comment: 1 drink a month    Drug use: No            Family History: Reviewed and non-contributory to rehab needs  family history includes Dementia in his father; No Known Problems in an other family member.    Allergies:   Patient has no known allergies.    Medications:   Current Outpatient Medications   Medication Instructions    calcium carbonate (TUMS) 200 mg calcium (500 mg) chewable tablet 400 mg elem calcium, Oral, 3 times a day (standard)    cholecalciferol (vitamin D3 25 mcg (1,000 units)) 1,000 Units, Oral    cholestyramine (QUESTRAN) 4 gram packet 1 packet, Oral, 2 times a day with meals, Mix 4 g (1 packet) into 2-3 ounces of fluid and take twice a day.    cyanocobalamin (vitamin B-12) 2,000 mcg, Oral, Daily    docusate sodium (COLACE) 50 mg, Oral, 2 times a day (standard)    loratadine (CLARITIN) 10 mg, Oral, Daily (standard)    metoPROLOL succinate (TOPROL-XL) 50 mg, Oral, Daily (standard)    multivitamin with minerals tablet 1 tablet, Oral, Daily (standard)    omeprazole (PRILOSEC) 40 mg, Oral    ondansetron (ZOFRAN) 8 mg, Oral, Every 8 hours PRN    sildenafiL (pulm.hypertension) (REVATIO) 20-100 mg, Oral, Daily PRN    tamsulosin (FLOMAX) 0.4 mg, Oral, Daily (standard)    valACYclovir (VALTREX) 500 mg, Oral, Daily (standard)    VENCLEXTA 400 mg, Oral, Daily (standard) Review of Systems:    General ROS:   Full 10 systems reviewed and neg, unless noted in HPI  OBJECTIVE:     Vitals:  Vitals:    05/03/23 1338   BP: 157/91   Pulse: 95   Resp: 16   Temp: 36.8 ??C (98.3 ??F)   SpO2: 98%         Physical Exam:    GEN: Sitting in bedside chair in NAD.  HEENT: Atraumatic. Normocephalic. Moist mucous membranes. Trachea midline.  RESP: NWOB on RA.  CV:+ BLE edema  SKIN: no rashes or ecchymoses on exposed skin    NEURO:  Mental Status: A&Ox3, attention full, speech fluid and coherent, follows commands   Motor:     RUE/LUE: shoulder abd 5/5, biceps 5/5, triceps 5/5, hand grasp 5/5    RLE/LLE: hip flexion 4/4, knee extension 5/5, DF 5/5  Balance: impaired  Gait:  Small steps, shuffling  Reflexes: 0 bilateral patellars  Cerebellar: no abnml or extraneous mvmts  PSYCH: mood euthymic, affect appropriate, thought process logical     Labs and Diagnostic Studies: Reviewed   CBC -   No results found for requested labs within last 2 days.     BMP -   No results found for requested labs within last 2 days.     Coagulation -   No results found for requested labs within last 2 days.     Cardiac markers -   No results found for requested labs within last 2 days.     LFT's -   No results found for requested labs within last 2 days.       Radiology Results: Reviewed PET 08/22/22  Impression   1.No definite evidence of hypermetabolic osseous disease within the limitations of this exam as above.   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       ASSESSMENT / RECOMMENDATIONS:     Timothy Porter is a 77 y.o. male with a past medical history of AML s/p ven, azacytidine along with multiple myeloma s/p autoSCT, lenalidomide maintenance presenting with mobility impairment.    Functional Impairment(s) and Rehabilitation Plan of Care    Mobility impairment  Discussed symptoms in depth  Agree with use of DME  Declined formal PT referral  Encouraged increased mobility and walking  Discussed exercises to do including squats and balance exercises  Encouraged protein intake    Follow up in 3 months or sooner if needed    _______________________________________________  Corrin Parker, MD  Director of Cancer Rehabilitation  Department of Physical Medicine and Rehabilitation  [place] [facility] [place]

## 2023-05-03 ENCOUNTER — Ambulatory Visit
Admit: 2023-05-03 | Discharge: 2023-05-04 | Payer: MEDICARE | Attending: Physical Medicine & Rehabilitation | Primary: Physical Medicine & Rehabilitation

## 2023-05-03 DIAGNOSIS — Z7682 Awaiting organ transplant status: Principal | ICD-10-CM

## 2023-05-03 DIAGNOSIS — Z7409 Other reduced mobility: Principal | ICD-10-CM

## 2023-05-04 DIAGNOSIS — Z005 Encounter for examination of potential donor of organ and tissue: Principal | ICD-10-CM

## 2023-05-04 NOTE — Unmapped (Signed)
Timothy Porter was contacted today to schedule follow up appointment with Dr. Landry Dyke, he asked  that I give him a call back in a few months. He would be having surgery and didn't want to plan anything.

## 2023-05-07 ENCOUNTER — Ambulatory Visit: Admit: 2023-05-07 | Discharge: 2023-05-08 | Payer: MEDICARE

## 2023-05-07 LAB — HLA CL I&II, HIGH RES

## 2023-05-11 ENCOUNTER — Ambulatory Visit: Admit: 2023-05-11 | Discharge: 2023-05-12 | Payer: MEDICARE

## 2023-05-11 LAB — HLA CL I&II, HIGH RES

## 2023-05-14 ENCOUNTER — Ambulatory Visit: Admit: 2023-05-14 | Discharge: 2023-05-15 | Payer: MEDICARE

## 2023-05-14 ENCOUNTER — Encounter
Admit: 2023-05-14 | Discharge: 2023-05-15 | Payer: MEDICARE | Attending: Nurse Practitioner | Primary: Nurse Practitioner

## 2023-05-14 ENCOUNTER — Other Ambulatory Visit: Admit: 2023-05-14 | Discharge: 2023-05-15 | Payer: MEDICARE

## 2023-05-14 DIAGNOSIS — C9201 Acute myeloblastic leukemia, in remission: Principal | ICD-10-CM

## 2023-05-14 DIAGNOSIS — C9200 Acute myeloblastic leukemia, not having achieved remission: Principal | ICD-10-CM

## 2023-05-14 DIAGNOSIS — C9000 Multiple myeloma not having achieved remission: Principal | ICD-10-CM

## 2023-05-14 LAB — COMPREHENSIVE METABOLIC PANEL
ALBUMIN: 3.8 g/dL (ref 3.4–5.0)
ALKALINE PHOSPHATASE: 86 U/L (ref 46–116)
ALT (SGPT): 41 U/L (ref 10–49)
ANION GAP: 7 mmol/L (ref 5–14)
AST (SGOT): 28 U/L (ref <=34)
BILIRUBIN TOTAL: 0.7 mg/dL (ref 0.3–1.2)
BLOOD UREA NITROGEN: 18 mg/dL (ref 9–23)
BUN / CREAT RATIO: 23
CALCIUM: 8.5 mg/dL — ABNORMAL LOW (ref 8.7–10.4)
CHLORIDE: 109 mmol/L — ABNORMAL HIGH (ref 98–107)
CO2: 27 mmol/L (ref 20.0–31.0)
CREATININE: 0.8 mg/dL
EGFR CKD-EPI (2021) MALE: >90 mL/min/{1.73_m2} (ref >=60)
GLUCOSE RANDOM: 116 mg/dL (ref 70–179)
POTASSIUM: 4 mmol/L (ref 3.4–4.8)
PROTEIN TOTAL: 6.5 g/dL (ref 5.7–8.2)
SODIUM: 143 mmol/L (ref 135–145)

## 2023-05-14 LAB — HLA CL I&II, HIGH RES

## 2023-05-14 LAB — CBC W/ AUTO DIFF
BASOPHILS ABSOLUTE COUNT: 0 10*9/L (ref 0.0–0.1)
BASOPHILS RELATIVE PERCENT: 0.9 %
EOSINOPHILS ABSOLUTE COUNT: 0 10*9/L (ref 0.0–0.5)
EOSINOPHILS RELATIVE PERCENT: 0 %
HEMATOCRIT: 36.6 % — ABNORMAL LOW (ref 39.0–48.0)
HEMOGLOBIN: 12.8 g/dL — ABNORMAL LOW (ref 12.9–16.5)
LYMPHOCYTES ABSOLUTE COUNT: 1.2 10*9/L (ref 1.1–3.6)
LYMPHOCYTES RELATIVE PERCENT: 46.6 %
MEAN CORPUSCULAR HEMOGLOBIN CONC: 35 g/dL (ref 32.0–36.0)
MEAN CORPUSCULAR HEMOGLOBIN: 35 pg — ABNORMAL HIGH (ref 25.9–32.4)
MEAN CORPUSCULAR VOLUME: 100.1 fL — ABNORMAL HIGH (ref 77.6–95.7)
MEAN PLATELET VOLUME: 7 fL (ref 6.8–10.7)
MONOCYTES ABSOLUTE COUNT: 0.4 10*9/L (ref 0.3–0.8)
MONOCYTES RELATIVE PERCENT: 14.6 %
NEUTROPHILS ABSOLUTE COUNT: 1 10*9/L — ABNORMAL LOW (ref 1.8–7.8)
NEUTROPHILS RELATIVE PERCENT: 37.9 %
PLATELET COUNT: 193 10*9/L (ref 150–450)
RED BLOOD CELL COUNT: 3.66 10*12/L — ABNORMAL LOW (ref 4.26–5.60)
RED CELL DISTRIBUTION WIDTH: 14.7 % (ref 12.2–15.2)
WBC ADJUSTED: 2.7 10*9/L — ABNORMAL LOW (ref 3.6–11.2)

## 2023-05-14 MED ADMIN — ondansetron (ZOFRAN) tablet 8 mg: 8 mg | ORAL | @ 15:00:00 | Stop: 2023-05-14

## 2023-05-14 MED ADMIN — azacitidine (VIDAZA) syringe: 75 mg/m2 | SUBCUTANEOUS | @ 15:00:00 | Stop: 2023-05-14

## 2023-05-14 NOTE — Unmapped (Cosign Needed)
I met with the patient to discuss the updated Informed Consent (version date 03/30/2023) for study [id number]. The updates to the protocol were reviewed, including discussion of risks and benefits, that the treatment involves research (if applicable), review of charges covered/not covered by study, medications/treatments used (if applicable), procedures, confidentiality, time commitments involved, study contact list, the option to withdraw at any time and required use of birth control (if applicable). Alternatives to continued study participation were discussed.     The patient had ample time to consider participation in the study, in the absence of coercion or undue influence. Patient was offered an opportunity to ask questions and these questions were answered to their satisfaction.   Patient verbalized understanding of information presented. The patient has signed and dated the following documents in my presence: Informed Consent (version date 03/30/2023)    Signed and dated copies of all consent documents were given to the patient. Every effort to maintain confidentiality will be employed.

## 2023-05-14 NOTE — Unmapped (Signed)
Lab on 05/14/2023   Component Date Value Ref Range Status    ABO Grouping 05/14/2023 A POS   Final    Antibody Screen 05/14/2023 NEG   Final    Sodium 05/14/2023 143  135 - 145 mmol/L Final    Potassium 05/14/2023 4.0  3.4 - 4.8 mmol/L Final    Chloride 05/14/2023 109 (H)  98 - 107 mmol/L Final    CO2 05/14/2023 27.0  20.0 - 31.0 mmol/L Final    Anion Gap 05/14/2023 7  5 - 14 mmol/L Final    BUN 05/14/2023 18  9 - 23 mg/dL Final    Creatinine 16/08/9603 0.80  0.73 - 1.18 mg/dL Final    BUN/Creatinine Ratio 05/14/2023 23   Final    eGFR CKD-EPI (2021) Male 05/14/2023 >90  >=60 mL/min/1.28m2 Final    eGFR calculated with CKD-EPI 2021 equation in accordance with [employer] and [employer] of Nephrology Task Force recommendations.    Glucose 05/14/2023 116  70 - 179 mg/dL Final    Calcium 54/07/8118 8.5 (L)  8.7 - 10.4 mg/dL Final    Albumin 14/78/2956 3.8  3.4 - 5.0 g/dL Final    Total Protein 05/14/2023 6.5  5.7 - 8.2 g/dL Final    Total Bilirubin 05/14/2023 0.7  0.3 - 1.2 mg/dL Final    AST 21/30/8657 28  <=34 U/L Final    ALT 05/14/2023 41  10 - 49 U/L Final    Alkaline Phosphatase 05/14/2023 86  46 - 116 U/L Final    WBC 05/14/2023 2.7 (L)  3.6 - 11.2 10*9/L Final    RBC 05/14/2023 3.66 (L)  4.26 - 5.60 10*12/L Final    HGB 05/14/2023 12.8 (L)  12.9 - 16.5 g/dL Final    HCT 84/69/6295 36.6 (L)  39.0 - 48.0 % Final    MCV 05/14/2023 100.1 (H)  77.6 - 95.7 fL Final    MCH 05/14/2023 35.0 (H)  25.9 - 32.4 pg Final    MCHC 05/14/2023 35.0  32.0 - 36.0 g/dL Final    RDW 28/41/3244 14.7  12.2 - 15.2 % Final    MPV 05/14/2023 7.0  6.8 - 10.7 fL Final    Platelet 05/14/2023 193  150 - 450 10*9/L Final    Neutrophils % 05/14/2023 37.9  % Final    Lymphocytes % 05/14/2023 46.6  % Final    Monocytes % 05/14/2023 14.6  % Final    Eosinophils % 05/14/2023 0.0  % Final    Basophils % 05/14/2023 0.9  % Final    Absolute Neutrophils 05/14/2023 1.0 (L)  1.8 - 7.8 10*9/L Final    Absolute Lymphocytes 05/14/2023 1.2  1.1 - 3.6 10*9/L Final    Absolute Monocytes 05/14/2023 0.4  0.3 - 0.8 10*9/L Final    Absolute Eosinophils 05/14/2023 0.0  0.0 - 0.5 10*9/L Final    Absolute Basophils 05/14/2023 0.0  0.0 - 0.1 10*9/L Final    Macrocytosis 05/14/2023 Slight (A)  Not Present Final   {

## 2023-05-14 NOTE — Unmapped (Signed)
Pt to clinic for treatment.  Aza administered sub-q to RLQ, pt tolerated well.a  Pt left ambulatory with the assistance of a cane.

## 2023-05-14 NOTE — Unmapped (Signed)
error 

## 2023-05-14 NOTE — Unmapped (Signed)
[facility] Leukemia Clinic Visit Note     Patient Name: Timothy Porter  Patient Age: 77 y.o.  Encounter Date: 05/14/2023    Primary Care Provider:  Pcp, None Per Patient    Referring Physician:  Pcp, None Per Patient  [address] [place],  [place] [address]    Reason for visit:  AML     Assessment:  Timothy Porter is a 77 y.o. 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  He enrolled on BAML S12 arm (ven x 28 days) and began treatment on 11/22/2022. After 1 cycle of treatment he was found to be in CR without MRD.  He has completed 5 cycles of treatment and presents to clinic today for follow up and consideration of cycle 7.     Timothy Porter is tolerating treatment on aza/ven quite well without complications or toxicities.  He did have a fall at home 2 weeks ago after a dizzy spell.  Denies incurring any injury or LOC.  Suspect that when his counts nadir during his cycles he may experience more dizziness.  I have instructed him on changing positions slowly and to be mindful midcycle.  Today on lab review his plts are 193 and ANC 1.0 and are sufficient to proceed with cycle 6 today.  He will receive azacitidine 75mg /m2 x 5 days + venetoclax 400mg  x 21 days.  He should not require any additional lab monitoring between cycles and should continue valtrex for ppx.  [name] has seen [name] with our BMT program and is currently undergoing evaluation.  I will reach out to BMT team to update him on where they are in the process.  Per the [facility] study, Timothy Porter should have a 6 month follow up BMbx, however if he has an upcoming marrow for pretransplant we will plant to use that one for [facility].  He will RTC in 4 weeks for follow up consideration of cycle 7.       Free Kappa Chain MM:  He will continue follow up with MM team/[name].           Diarrhea: Resolved with increasing cholestyramine.  Stools are now formed with decrease in urgency.                                            Plan:   - proceed with cycle 6 azacitidine 75mg /m2 x 5 days + venetoclax 400mg  x 21 days.   - valtrex only for ppx.   - will reach out to BMT re: follow up  - RTC in 4 weeks for follow up and consideration of cycle 7.       [name] was available    [name], AGNP-BC  Leukemia Research Nurse Practitioner  Hematology/Oncology Division  [facility]  05/14/2023    I personally spent 45 minutes face-to-face and non-face-to-face in the care of this patient, which  includes all pre, intra, and post visit time on the date of service.  All documented time was specific to the E/M visit and does not include any procedures that may have been performed.    Hematology/Oncology History Overview Note   Referring/Local Oncologist:  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      This electronic signature is attestation that the pathologist personally reviewed the submitted material(s) and the final diagnosis reflects that evaluation.          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)[3]/46,XY[7]         Myeloid Mutational Panel:        Variants of Known/Likely Clinical Significance:   Gene Coding Predicted Protein Variant allele fraction   SRSF2 c.284C>G p.(Pro95Arg) 21.6 %       Treatment Timeline:  11/22/2022 Cycle 1:  BAML S12 azacitidine 75mg /m2 x 7 days + venetoclax 400mg  x 28 days  12/13/22: Bmbx - Limited, though no increase in blasts, MRD-neg by flow, poor specimen but no blasts (at <0.2%)   01/04/2023 Cycle 2: aza 75 x 7 + ven 400 x 28 days (2 week delay)  01/23/2023: BMbx: <5% blasts by CD34 immunohistochemistry, flow MRD negative  02/09/2023: Cycle 3: Aza 75 x 7 + ven 400 x 21  03/19/2023: Cycle 4 Aza 75 x 5 + ven 400 x 21 (Aza decreased due to delayed count recovery)  04/16/2023: Cycle 5: Aza 75 x 5 + ven 400 x 21  05/14/2023: Cycle 6: Aza 75 x 5 + ven 400 x 21     Multiple myeloma (CMS-HCC)   02/12/2019 Initial Diagnosis    Multiple myeloma (CMS-HCC)     05/28/2019 - 06/03/2019 Chemotherapy    BMT IP AUTO MELPHALAN  Melphalan 140 mg/m2 or 200 mg/m2 IV Day -1     10/22/2019 - 06/27/2022 Chemotherapy    STUDY [id number] IRB# [id number] LENALIDOMIDE (v. 11/04/18)  A Randomized Study of Daratumumab Plus Lenalidomide Versus Lenalidomide Alone as Maintenance Treatment in Patients with Newly Diagnosed Multiple Myeloma Who Are Minimal Residual Disease Positive After Frontline Autologous Stem Cell Transplant.     History of auto stem cell transplant (CMS-HCC) (Resolved)   08/05/2019 Initial Diagnosis    History of auto stem cell transplant (CMS-HCC)     10/22/2019 - 06/27/2022 Chemotherapy    STUDY [id number] IRB# [id number] LENALIDOMIDE (v. 11/04/18)  A Randomized Study of Daratumumab Plus Lenalidomide Versus Lenalidomide Alone as Maintenance Treatment in Patients with Newly Diagnosed Multiple Myeloma Who Are Minimal Residual Disease Positive After Frontline Autologous Stem Cell Transplant.     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 Bromwell states he has been feeling well. He did  have an episode of dizziness 2 weeks ago and states he fell to the floor.  He did not hit his head, but landed on his tailbone and this has been sore since then.  Denies any LOC.  He states that he feels stronger now than he did prior to his diagnosis with MM, but the morning hours from 9-11 are when he tends to feel his worst.  He continues to walk daily and is currently working with PT to increase his leg strength.  I have advised him that during his 2nd-3rd week of treatment are when his counts are probably at their lowest and this may be the time that he experiences dizziness.  I further advised that he change positions slowly to help prevent further dizziness.  He denies any n/v.  Diarrhea is much better controlled now. Denies any new sob/cough.  No new fevers/chills.     Otherwise, he denies new constitutional symptoms such as anorexia, weight loss, fatigue, night sweats or unexplained fevers.  Furthermore, he denies unexplained bleeding or bruising, recurrent or unexplained intercurrent infections, dyspnea on exertion, lightheadedness, palpitations or chest pain.  There have been no new or unexplained pains or self-identified masses, swelling or enlarged lymph nodes.    ROS reviewed and negative except as noted in H and P     ECOG: 1    Allergies:  No Known Allergies    Medications:   Current Outpatient Medications:     calcium carbonate (TUMS) 200 mg calcium (500 mg) chewable tablet, Chew 2 tablets (400 mg of elem calcium total) Three (3) times a day., Disp: , Rfl:     cholecalciferol, vitamin D3 25 mcg, 1,000 units,, 1,000 unit (25 mcg) tablet, Take 1 tablet (25 mcg total) by mouth., Disp: , Rfl:     cholestyramine (QUESTRAN) 4 gram packet, Take 1 packet by mouth in the morning and 1 packet in the evening. Take with meals. Mix 4 g (1 packet) into 2-3 ounces of fluid and take twice a day.., Disp: 60 packet, Rfl: 5    cyanocobalamin, vitamin B-12, 2,000 mcg Tab, Take 2,000 mcg by mouth in the morning., Disp: 90 tablet, Rfl: 3    docusate sodium (COLACE) 50 MG capsule, Take 1 capsule (50 mg total) by mouth two (2) times a day., Disp: , Rfl:     loratadine (CLARITIN) 10 mg tablet, Take 1 tablet (10 mg total) by mouth daily., Disp: , Rfl:     metoPROLOL succinate (TOPROL-XL) 50 MG 24 hr tablet, Take 1 tablet (50 mg total) by mouth daily., Disp: 30 tablet, Rfl: 11    multivitamin with minerals tablet, Take 1 tablet by mouth daily., Disp: , Rfl:     omeprazole (PRILOSEC) 40 MG capsule, TAKE 1 CAPSULE(40 MG) BY MOUTH DAILY, Disp: 90 capsule, Rfl: 0    ondansetron (ZOFRAN) 8 MG tablet, Take 1 tablet (8 mg total) by mouth every eight (8) hours as needed for nausea., Disp: 30 tablet, Rfl: 2    sildenafiL, pulm.hypertension, (REVATIO) 20 mg tablet, Take 1-5 tablets (20-100 mg total) by mouth daily as needed (for erectile function)., Disp: 90 tablet, Rfl: 3    tamsulosin (FLOMAX) 0.4 mg capsule, Take 1 capsule (0.4 mg total) by mouth daily., Disp: 90 capsule, Rfl: 0    valACYclovir (VALTREX) 500 MG tablet, Take 1 tablet (500 mg total) by mouth daily., Disp: 90 tablet, Rfl: 3    venetoclax (VENCLEXTA) 100 mg tablet, Take 4 tablets (400 mg total) by mouth daily  for 21 days., Disp: 84 tablet, Rfl: 4  No current facility-administered medications for this visit.    Facility-Administered Medications Ordered in Other Visits:     DTaP-hepatitis B recombinant-IPV (PEDIARIX) 10 mcg-25Lf-25 mcg-10Lf/0.5 mL injection, , , ,     DTaP-hepatitis B recombinant-IPV (PEDIARIX) 10 mcg-25Lf-25 mcg-10Lf/0.5 mL injection, , , ,     haemophilus B polysac-tetanus toxoid (ActHIB) 10 mcg/0.5 mL injection, , , ,     influenza vaccine quad (FLUARIX, FLULAVAL, FLUZONE) (6 MOS & UP) 2020-21 ADS Med, , , , pneumococcal conjugate (13-valent) (PREVNAR-13) 0.5 mL vaccine, , , ,     pneumococcal conjugate (13-valent) (PREVNAR-13) 0.5 mL vaccine, , , ,     varicella-zoster gE-AS01B (PF) (SHINGRIX) 50 mcg/0.5 mL injection, , , ,     Medical History:  Past Medical History:   Diagnosis Date    Benign prostatic hyperplasia with lower urinary tract symptoms 11/27/2018    BPH (benign prostatic hyperplasia)     Diarrhea     Fatigue     Fractures     left arm    Glucose intolerance     May 2019: HbA1c 6.2.    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 CMV negative 04/07/20.     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  Planning to build more apartments.       Family History:  Family History   Problem Relation Age of Onset    Dementia Father     No Known Problems Other        Objective:   Vitals:    05/14/23 0857   BP: 145/89   Pulse: 84   Resp: 20   Temp: 36.7 ??C (98.1 ??F)   SpO2: 100%         Physical Exam:  General: Resting, in no apparent distress  HEENT:  PERRL. No scleral icterus or conjunctival injection.   Heart:  RRR.  S1, S2.  No murmurs, gallops or rubs. Mild non-pitting edema noted BLE.   Lungs:  Breathing is unlabored, and patient is speaking full sentences with ease.  CTAB. No rales, ronchi or crackles.    Abdomen:  No distention or pain on palpation.  Bowel sounds are present.  No palpable masses.  Skin:  No rashes, petechiae or purpura. Grossly intact.  Musculoskeletal:  strength is equal bilaterally   Psychiatric:  Range of affect is appropriate.    Neurologic:  Alert and oriented x 4.  Steady gait with assistance of cane.    Test Results:  Lab on 05/14/2023   Component Date Value Ref Range Status    ABO Grouping 05/14/2023 A POS   Final    Antibody Screen 05/14/2023 NEG   Final    Sodium 05/14/2023 143  135 - 145 mmol/L Final    Potassium 05/14/2023 4.0  3.4 - 4.8 mmol/L Final  Chloride 05/14/2023 109 (H)  98 - 107 mmol/L Final    CO2 05/14/2023 27.0  20.0 - 31.0 mmol/L Final    Anion Gap 05/14/2023 7  5 - 14 mmol/L Final    BUN 05/14/2023 18  9 - 23 mg/dL Final    Creatinine 91/47/8295 0.80  0.73 - 1.18 mg/dL Final    BUN/Creatinine Ratio 05/14/2023 23   Final    eGFR CKD-EPI (2021) Male 05/14/2023 >90  >=60 mL/min/1.58m2 Final    eGFR calculated with CKD-EPI 2021 equation in accordance with [employer] and [employer] of Nephrology Task Force recommendations.    Glucose 05/14/2023 116  70 - 179 mg/dL Final    Calcium 62/13/0865 8.5 (L)  8.7 - 10.4 mg/dL Final    Albumin 78/46/9629 3.8  3.4 - 5.0 g/dL Final    Total Protein 05/14/2023 6.5  5.7 - 8.2 g/dL Final    Total Bilirubin 05/14/2023 0.7  0.3 - 1.2 mg/dL Final    AST 52/84/1324 28  <=34 U/L Final    ALT 05/14/2023 41  10 - 49 U/L Final    Alkaline Phosphatase 05/14/2023 86  46 - 116 U/L Final    WBC 05/14/2023 2.7 (L)  3.6 - 11.2 10*9/L Final    RBC 05/14/2023 3.66 (L)  4.26 - 5.60 10*12/L Final    HGB 05/14/2023 12.8 (L)  12.9 - 16.5 g/dL Final    HCT 40/08/2724 36.6 (L)  39.0 - 48.0 % Final    MCV 05/14/2023 100.1 (H)  77.6 - 95.7 fL Final    MCH 05/14/2023 35.0 (H)  25.9 - 32.4 pg Final    MCHC 05/14/2023 35.0  32.0 - 36.0 g/dL Final    RDW 36/64/4034 14.7  12.2 - 15.2 % Final    MPV 05/14/2023 7.0  6.8 - 10.7 fL Final    Platelet 05/14/2023 193  150 - 450 10*9/L Final    Neutrophils % 05/14/2023 37.9  % Final    Lymphocytes % 05/14/2023 46.6  % Final    Monocytes % 05/14/2023 14.6  % Final    Eosinophils % 05/14/2023 0.0  % Final    Basophils % 05/14/2023 0.9  % Final    Absolute Neutrophils 05/14/2023 1.0 (L)  1.8 - 7.8 10*9/L Final    Absolute Lymphocytes 05/14/2023 1.2  1.1 - 3.6 10*9/L Final    Absolute Monocytes 05/14/2023 0.4  0.3 - 0.8 10*9/L Final    Absolute Eosinophils 05/14/2023 0.0  0.0 - 0.5 10*9/L Final    Absolute Basophils 05/14/2023 0.0  0.0 - 0.1 10*9/L Final    Macrocytosis 05/14/2023 Slight (A)  Not Present Final

## 2023-05-14 NOTE — Unmapped (Unsigned)
Labs drawn via butterfly & sent for analysis.  Patient sent to next appointment. Care provided by Jennifer Puff, RN.

## 2023-05-15 ENCOUNTER — Ambulatory Visit: Admit: 2023-05-15 | Discharge: 2023-05-16 | Payer: MEDICARE

## 2023-05-15 MED ADMIN — azacitidine (VIDAZA) syringe: 75 mg/m2 | SUBCUTANEOUS | @ 13:00:00 | Stop: 2023-05-15

## 2023-05-15 MED ADMIN — ondansetron (ZOFRAN) tablet 8 mg: 8 mg | ORAL | @ 13:00:00 | Stop: 2023-05-15

## 2023-05-15 NOTE — Unmapped (Signed)
RED ZONE Means: RED ZONE: Take action now!     You need to be seen right away  Symptoms are at a severe level of discomfort    Call 911 or go to your nearest  Hospital for help     - Bleeding that will not stop    - Hard to breathe    - New seizure - Chest pain  - Fall or passing out  -Thoughts of hurting    yourself or others      Call 911 if you are going into the RED ZONE                  YELLOW ZONE Means:     Please call with any new or worsening symptom(s), even if not on this list.  Call [phone]  After hours, weekends, and holidays - you will reach a long recording with specific instructions, If not in an emergency such as above, please listen closely all the way to the end and choose the option that relates to your need.   You can be seen by a provider the same day through our Same Day Acute Care for Patients with Cancer program.      YELLOW ZONE: Take action today     Symptoms are new or worsening  You are not within your goal range for:    - Pain    - Shortness of breath    - Bleeding (nose, urine, stool, wound)    - Feeling sick to your stomach and throwing up    - Mouth sores/pain in your mouth or throat    - Hard stool or very loose stools (increase in       ostomy output)    - No urine for 12 hours    - Feeding tube or other catheter/tube issue    - Redness or pain at previous IV or port/catheter site    - Depressed or anxiety   - Swelling (leg, arm, abdomen,     face, neck)  - Skin rash or skin changes  - Wound issues (redness, drainage,    re-opened)  - Confusion  - Vision changes  - Fever >100.4 F or chills  - Worsening cough with mucus that is    green, yellow, or bloody  - Pain or burning when going to the    bathroom  - Home Infusion Pump Issue- call    [phone]         Call your healthcare provider if you are going into the YELLOW ZONE     GREEN ZONE Means:  Your symptoms are under controls  Continue to take your medicine as ordered  Keep all visits to the provider GREEN ZONE: You are in control  No increase or worsening symptoms  Able to take your medicine  Able to drink and eat    - DO NOT use MyChart messages to report red or yellow symptoms. Allow up to 3    business days for a reply.  -MyChart is for non-urgent medication refills, scheduling requests, or other general questions.         [id number] Rev. 05/26/2022  Approved by Oncology Patient Education Committee         No visits with results within 1 Day(s) from this visit.   Latest known visit with results is:   Lab on 05/14/2023   Component Date Value Ref Range Status    ABO Grouping 05/14/2023 A POS   Final  Antibody Screen 05/14/2023 NEG   Final    Sodium 05/14/2023 143  135 - 145 mmol/L Final    Potassium 05/14/2023 4.0  3.4 - 4.8 mmol/L Final    Chloride 05/14/2023 109 (H)  98 - 107 mmol/L Final    CO2 05/14/2023 27.0  20.0 - 31.0 mmol/L Final    Anion Gap 05/14/2023 7  5 - 14 mmol/L Final    BUN 05/14/2023 18  9 - 23 mg/dL Final    Creatinine 11/29/7251 0.80  0.73 - 1.18 mg/dL Final    BUN/Creatinine Ratio 05/14/2023 23   Final    eGFR CKD-EPI (2021) Male 05/14/2023 >90  >=60 mL/min/1.52m2 Final    eGFR calculated with CKD-EPI 2021 equation in accordance with [employer] and [employer] of Nephrology Task Force recommendations.    Glucose 05/14/2023 116  70 - 179 mg/dL Final    Calcium 66/44/0347 8.5 (L)  8.7 - 10.4 mg/dL Final    Albumin 42/59/5638 3.8  3.4 - 5.0 g/dL Final    Total Protein 05/14/2023 6.5  5.7 - 8.2 g/dL Final    Total Bilirubin 05/14/2023 0.7  0.3 - 1.2 mg/dL Final    AST 75/64/3329 28  <=34 U/L Final    ALT 05/14/2023 41  10 - 49 U/L Final    Alkaline Phosphatase 05/14/2023 86  46 - 116 U/L Final    WBC 05/14/2023 2.7 (L)  3.6 - 11.2 10*9/L Final    RBC 05/14/2023 3.66 (L)  4.26 - 5.60 10*12/L Final    HGB 05/14/2023 12.8 (L)  12.9 - 16.5 g/dL Final    HCT 51/88/4166 36.6 (L)  39.0 - 48.0 % Final    MCV 05/14/2023 100.1 (H)  77.6 - 95.7 fL Final    MCH 05/14/2023 35.0 (H)  25.9 - 32.4 pg Final    MCHC 05/14/2023 35.0  32.0 - 36.0 g/dL Final    RDW 05/26/1600 14.7  12.2 - 15.2 % Final    MPV 05/14/2023 7.0  6.8 - 10.7 fL Final    Platelet 05/14/2023 193  150 - 450 10*9/L Final    Neutrophils % 05/14/2023 37.9  % Final    Lymphocytes % 05/14/2023 46.6  % Final    Monocytes % 05/14/2023 14.6  % Final    Eosinophils % 05/14/2023 0.0  % Final    Basophils % 05/14/2023 0.9  % Final    Absolute Neutrophils 05/14/2023 1.0 (L)  1.8 - 7.8 10*9/L Final    Absolute Lymphocytes 05/14/2023 1.2  1.1 - 3.6 10*9/L Final    Absolute Monocytes 05/14/2023 0.4  0.3 - 0.8 10*9/L Final    Absolute Eosinophils 05/14/2023 0.0  0.0 - 0.5 10*9/L Final    Absolute Basophils 05/14/2023 0.0  0.0 - 0.1 10*9/L Final    Macrocytosis 05/14/2023 Slight (A)  Not Present Final

## 2023-05-15 NOTE — Unmapped (Signed)
Chief concern/reason for visit:  Client here for administration of Azacitidine injection. Client completed injection without complications. Client discharged home in stable condition.     Assessment/Plan:     Client receiving treatment for the following diagnosis:  1. 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

## 2023-05-16 ENCOUNTER — Ambulatory Visit: Admit: 2023-05-16 | Discharge: 2023-05-17 | Payer: MEDICARE

## 2023-05-16 MED ADMIN — azacitidine (VIDAZA) syringe: 75 mg/m2 | SUBCUTANEOUS | @ 12:00:00 | Stop: 2023-05-16

## 2023-05-16 NOTE — Unmapped (Signed)
No visits with results within 1 Day(s) from this visit.   Latest known visit with results is:   Lab on 05/14/2023   Component Date Value Ref Range Status    ABO Grouping 05/14/2023 A POS   Final    Antibody Screen 05/14/2023 NEG   Final    Sodium 05/14/2023 143  135 - 145 mmol/L Final    Potassium 05/14/2023 4.0  3.4 - 4.8 mmol/L Final    Chloride 05/14/2023 109 (H)  98 - 107 mmol/L Final    CO2 05/14/2023 27.0  20.0 - 31.0 mmol/L Final    Anion Gap 05/14/2023 7  5 - 14 mmol/L Final    BUN 05/14/2023 18  9 - 23 mg/dL Final    Creatinine 19/14/7829 0.80  0.73 - 1.18 mg/dL Final    BUN/Creatinine Ratio 05/14/2023 23   Final    eGFR CKD-EPI (2021) Male 05/14/2023 >90  >=60 mL/min/1.82m2 Final    eGFR calculated with CKD-EPI 2021 equation in accordance with [employer] and [employer] of Nephrology Task Force recommendations.    Glucose 05/14/2023 116  70 - 179 mg/dL Final    Calcium 56/21/3086 8.5 (L)  8.7 - 10.4 mg/dL Final    Albumin 57/84/6962 3.8  3.4 - 5.0 g/dL Final    Total Protein 05/14/2023 6.5  5.7 - 8.2 g/dL Final    Total Bilirubin 05/14/2023 0.7  0.3 - 1.2 mg/dL Final    AST 95/28/4132 28  <=34 U/L Final    ALT 05/14/2023 41  10 - 49 U/L Final    Alkaline Phosphatase 05/14/2023 86  46 - 116 U/L Final    WBC 05/14/2023 2.7 (L)  3.6 - 11.2 10*9/L Final    RBC 05/14/2023 3.66 (L)  4.26 - 5.60 10*12/L Final    HGB 05/14/2023 12.8 (L)  12.9 - 16.5 g/dL Final    HCT 44/11/270 36.6 (L)  39.0 - 48.0 % Final    MCV 05/14/2023 100.1 (H)  77.6 - 95.7 fL Final    MCH 05/14/2023 35.0 (H)  25.9 - 32.4 pg Final    MCHC 05/14/2023 35.0  32.0 - 36.0 g/dL Final    RDW 53/66/4403 14.7  12.2 - 15.2 % Final    MPV 05/14/2023 7.0  6.8 - 10.7 fL Final    Platelet 05/14/2023 193  150 - 450 10*9/L Final    Neutrophils % 05/14/2023 37.9  % Final    Lymphocytes % 05/14/2023 46.6  % Final    Monocytes % 05/14/2023 14.6  % Final    Eosinophils % 05/14/2023 0.0  % Final    Basophils % 05/14/2023 0.9  % Final Absolute Neutrophils 05/14/2023 1.0 (L)  1.8 - 7.8 10*9/L Final    Absolute Lymphocytes 05/14/2023 1.2  1.1 - 3.6 10*9/L Final    Absolute Monocytes 05/14/2023 0.4  0.3 - 0.8 10*9/L Final    Absolute Eosinophils 05/14/2023 0.0  0.0 - 0.5 10*9/L Final    Absolute Basophils 05/14/2023 0.0  0.0 - 0.1 10*9/L Final    Macrocytosis 05/14/2023 Slight (A)  Not Present Final

## 2023-05-16 NOTE — Unmapped (Signed)
Patient arrived to chair 5 for C6D3 Aza.  No new complaints noted.  Patient completed and tolerated treatment.  AVS declined and patient discharged to home.

## 2023-05-17 ENCOUNTER — Ambulatory Visit: Admit: 2023-05-17 | Discharge: 2023-05-18 | Payer: MEDICARE

## 2023-05-17 LAB — COMPREHENSIVE METABOLIC PANEL
ALBUMIN: 3.9 g/dL (ref 3.4–5.0)
ALKALINE PHOSPHATASE: 88 U/L (ref 46–116)
ALT (SGPT): 42 U/L (ref 10–49)
ANION GAP: 4 mmol/L — ABNORMAL LOW (ref 5–14)
AST (SGOT): 35 U/L — ABNORMAL HIGH (ref <=34)
BILIRUBIN TOTAL: 0.8 mg/dL (ref 0.3–1.2)
BLOOD UREA NITROGEN: 19 mg/dL (ref 9–23)
BUN / CREAT RATIO: 22
CALCIUM: 8.9 mg/dL (ref 8.7–10.4)
CHLORIDE: 108 mmol/L — ABNORMAL HIGH (ref 98–107)
CO2: 28 mmol/L (ref 20.0–31.0)
CREATININE: 0.88 mg/dL
EGFR CKD-EPI (2021) MALE: 90 mL/min/{1.73_m2} (ref >=60)
GLUCOSE RANDOM: 126 mg/dL (ref 70–179)
POTASSIUM: 4.5 mmol/L (ref 3.4–4.8)
PROTEIN TOTAL: 6.6 g/dL (ref 5.7–8.2)
SODIUM: 140 mmol/L (ref 135–145)

## 2023-05-17 LAB — CBC W/ AUTO DIFF
BASOPHILS ABSOLUTE COUNT: 0 10*9/L (ref 0.0–0.1)
BASOPHILS RELATIVE PERCENT: 0.2 %
EOSINOPHILS ABSOLUTE COUNT: 0 10*9/L (ref 0.0–0.5)
EOSINOPHILS RELATIVE PERCENT: 0 %
HEMATOCRIT: 36.3 % — ABNORMAL LOW (ref 39.0–48.0)
HEMOGLOBIN: 12.4 g/dL — ABNORMAL LOW (ref 12.9–16.5)
LYMPHOCYTES ABSOLUTE COUNT: 1.2 10*9/L (ref 1.1–3.6)
LYMPHOCYTES RELATIVE PERCENT: 46.7 %
MEAN CORPUSCULAR HEMOGLOBIN CONC: 34.1 g/dL (ref 32.0–36.0)
MEAN CORPUSCULAR HEMOGLOBIN: 34.5 pg — ABNORMAL HIGH (ref 25.9–32.4)
MEAN CORPUSCULAR VOLUME: 101.2 fL — ABNORMAL HIGH (ref 77.6–95.7)
MEAN PLATELET VOLUME: 7.1 fL (ref 6.8–10.7)
MONOCYTES ABSOLUTE COUNT: 0.4 10*9/L (ref 0.3–0.8)
MONOCYTES RELATIVE PERCENT: 16.8 %
NEUTROPHILS ABSOLUTE COUNT: 0.9 10*9/L — ABNORMAL LOW (ref 1.8–7.8)
NEUTROPHILS RELATIVE PERCENT: 36.3 %
PLATELET COUNT: 146 10*9/L — ABNORMAL LOW (ref 150–450)
RED BLOOD CELL COUNT: 3.59 10*12/L — ABNORMAL LOW (ref 4.26–5.60)
RED CELL DISTRIBUTION WIDTH: 14.5 % (ref 12.2–15.2)
WBC ADJUSTED: 2.6 10*9/L — ABNORMAL LOW (ref 3.6–11.2)

## 2023-05-17 LAB — HLA CL I&II, HIGH RES

## 2023-05-17 MED ADMIN — azacitidine (VIDAZA) syringe: 75 mg/m2 | SUBCUTANEOUS | @ 13:00:00 | Stop: 2023-05-17

## 2023-05-17 MED ADMIN — ondansetron (ZOFRAN) tablet 8 mg: 8 mg | ORAL | @ 12:00:00 | Stop: 2023-05-17

## 2023-05-17 NOTE — Unmapped (Signed)
Hospital Outpatient Visit on 05/17/2023   Component Date Value Ref Range Status    Sodium 05/17/2023 140  135 - 145 mmol/L Final    Potassium 05/17/2023 4.5  3.4 - 4.8 mmol/L Final    Chloride 05/17/2023 108 (H)  98 - 107 mmol/L Final    CO2 05/17/2023 28.0  20.0 - 31.0 mmol/L Final    Anion Gap 05/17/2023 4 (L)  5 - 14 mmol/L Final    BUN 05/17/2023 19  9 - 23 mg/dL Final    Creatinine 16/08/9603 0.88  0.73 - 1.18 mg/dL Final    BUN/Creatinine Ratio 05/17/2023 22   Final    eGFR CKD-EPI (2021) Male 05/17/2023 90  >=60 mL/min/1.29m2 Final    eGFR calculated with CKD-EPI 2021 equation in accordance with [employer] and [employer] of Nephrology Task Force recommendations.    Glucose 05/17/2023 126  70 - 179 mg/dL Final    Calcium 54/07/8118 8.9  8.7 - 10.4 mg/dL Final    Albumin 14/78/2956 3.9  3.4 - 5.0 g/dL Final    Total Protein 05/17/2023 6.6  5.7 - 8.2 g/dL Final    Total Bilirubin 05/17/2023 0.8  0.3 - 1.2 mg/dL Final    AST 21/30/8657 35 (H)  <=34 U/L Final    ALT 05/17/2023 42  10 - 49 U/L Final    Alkaline Phosphatase 05/17/2023 88  46 - 116 U/L Final    WBC 05/17/2023 2.6 (L)  3.6 - 11.2 10*9/L Final    RBC 05/17/2023 3.59 (L)  4.26 - 5.60 10*12/L Final    HGB 05/17/2023 12.4 (L)  12.9 - 16.5 g/dL Final    HCT 84/69/6295 36.3 (L)  39.0 - 48.0 % Final    MCV 05/17/2023 101.2 (H)  77.6 - 95.7 fL Final    MCH 05/17/2023 34.5 (H)  25.9 - 32.4 pg Final    MCHC 05/17/2023 34.1  32.0 - 36.0 g/dL Final    RDW 28/41/3244 14.5  12.2 - 15.2 % Final    MPV 05/17/2023 7.1  6.8 - 10.7 fL Final    Platelet 05/17/2023 146 (L)  150 - 450 10*9/L Final    Neutrophils % 05/17/2023 36.3  % Final    Lymphocytes % 05/17/2023 46.7  % Final    Monocytes % 05/17/2023 16.8  % Final    Eosinophils % 05/17/2023 0.0  % Final    Basophils % 05/17/2023 0.2  % Final    Absolute Neutrophils 05/17/2023 0.9 (L)  1.8 - 7.8 10*9/L Final    Absolute Lymphocytes 05/17/2023 1.2  1.1 - 3.6 10*9/L Final    Absolute Monocytes 05/17/2023 0.4  0.3 - 0.8 10*9/L Final    Absolute Eosinophils 05/17/2023 0.0  0.0 - 0.5 10*9/L Final    Absolute Basophils 05/17/2023 0.0  0.0 - 0.1 10*9/L Final    Macrocytosis 05/17/2023 Slight (A)  Not Present Final

## 2023-05-17 NOTE — Unmapped (Signed)
Patient tolerated injection. Discharged home.

## 2023-05-18 ENCOUNTER — Ambulatory Visit: Admit: 2023-05-18 | Discharge: 2023-05-19 | Payer: MEDICARE

## 2023-05-18 MED ADMIN — ondansetron (ZOFRAN) tablet 8 mg: 8 mg | ORAL | @ 13:00:00 | Stop: 2023-05-18

## 2023-05-18 MED ADMIN — azacitidine (VIDAZA) syringe: 75 mg/m2 | SUBCUTANEOUS | @ 13:00:00 | Stop: 2023-05-18

## 2023-05-18 NOTE — Unmapped (Signed)
Pt in chair 5, here for chemo  Tolerating injection right abd     AVS declined   Ambulatory fpr dc home

## 2023-05-18 NOTE — Unmapped (Signed)
No visits with results within 1 Day(s) from this visit.   Latest known visit with results is:   Hospital Outpatient Visit on 05/17/2023   Component Date Value Ref Range Status    Sodium 05/17/2023 140  135 - 145 mmol/L Final    Potassium 05/17/2023 4.5  3.4 - 4.8 mmol/L Final    Chloride 05/17/2023 108 (H)  98 - 107 mmol/L Final    CO2 05/17/2023 28.0  20.0 - 31.0 mmol/L Final    Anion Gap 05/17/2023 4 (L)  5 - 14 mmol/L Final    BUN 05/17/2023 19  9 - 23 mg/dL Final    Creatinine 81/19/1478 0.88  0.73 - 1.18 mg/dL Final    BUN/Creatinine Ratio 05/17/2023 22   Final    eGFR CKD-EPI (2021) Male 05/17/2023 90  >=60 mL/min/1.2m2 Final    eGFR calculated with CKD-EPI 2021 equation in accordance with [employer] and [employer] of Nephrology Task Force recommendations.    Glucose 05/17/2023 126  70 - 179 mg/dL Final    Calcium 29/56/2130 8.9  8.7 - 10.4 mg/dL Final    Albumin 86/57/8469 3.9  3.4 - 5.0 g/dL Final    Total Protein 05/17/2023 6.6  5.7 - 8.2 g/dL Final    Total Bilirubin 05/17/2023 0.8  0.3 - 1.2 mg/dL Final    AST 62/95/2841 35 (H)  <=34 U/L Final    ALT 05/17/2023 42  10 - 49 U/L Final    Alkaline Phosphatase 05/17/2023 88  46 - 116 U/L Final    WBC 05/17/2023 2.6 (L)  3.6 - 11.2 10*9/L Final    RBC 05/17/2023 3.59 (L)  4.26 - 5.60 10*12/L Final    HGB 05/17/2023 12.4 (L)  12.9 - 16.5 g/dL Final    HCT 32/44/0102 36.3 (L)  39.0 - 48.0 % Final    MCV 05/17/2023 101.2 (H)  77.6 - 95.7 fL Final    MCH 05/17/2023 34.5 (H)  25.9 - 32.4 pg Final    MCHC 05/17/2023 34.1  32.0 - 36.0 g/dL Final    RDW 72/53/6644 14.5  12.2 - 15.2 % Final    MPV 05/17/2023 7.1  6.8 - 10.7 fL Final    Platelet 05/17/2023 146 (L)  150 - 450 10*9/L Final    Neutrophils % 05/17/2023 36.3  % Final    Lymphocytes % 05/17/2023 46.7  % Final    Monocytes % 05/17/2023 16.8  % Final    Eosinophils % 05/17/2023 0.0  % Final    Basophils % 05/17/2023 0.2  % Final    Absolute Neutrophils 05/17/2023 0.9 (L)  1.8 - 7.8 10*9/L Final    Absolute Lymphocytes 05/17/2023 1.2  1.1 - 3.6 10*9/L Final    Absolute Monocytes 05/17/2023 0.4  0.3 - 0.8 10*9/L Final    Absolute Eosinophils 05/17/2023 0.0  0.0 - 0.5 10*9/L Final    Absolute Basophils 05/17/2023 0.0  0.0 - 0.1 10*9/L Final    Macrocytosis 05/17/2023 Slight (A)  Not Present Final

## 2023-05-18 NOTE — Unmapped (Signed)
[facility] Refill Coordination Note    Specialty Medication(s) to be Shipped:   Hematology/Oncology: Venclexta    Other medication(s) to be shipped: No additional medications requested for fill at this time     Timothy Porter, DOB: 25-May-1946  Phone: [phone] (home)       All above HIPAA information was verified with patient.     Was a Nurse, learning disability used for this call? No    Completed refill call assessment today to schedule patient's medication shipment from the [facility] [phone]).  All relevant notes have been reviewed.     Specialty medication(s) and dose(s) confirmed: Regimen is correct and unchanged.   Changes to medications: [name] reports no changes at this time.  Changes to insurance: No  New side effects reported not previously addressed with a pharmacist or physician: None reported  Questions for the pharmacist: No    Confirmed patient received a Conservation officer, historic buildings and a Surveyor, mining with first shipment. The patient will receive a drug information handout for each medication shipped and additional FDA Medication Guides as required.       DISEASE/MEDICATION-SPECIFIC INFORMATION        N/A    SPECIALTY MEDICATION ADHERENCE     Medication Adherence    Patient reported X missed doses in the last month: 0  Specialty Medication: Venclexta 100mg   Patient is on additional specialty medications: No  Informant: patient              Were doses missed due to medication being on hold? No      VENCLEXTA 100 mg tablet (venetoclax): 10 days of medicine on hand       REFERRAL TO PHARMACIST     Referral to the pharmacist: Not needed      [facility]     Shipping address confirmed in Epic.       Delivery Scheduled: Yes, Expected medication delivery date: 05/23/2023.     Medication will be delivered via Next Day Courier to the prescription address in Epic [place].    Timothy Porter   [facility] Specialty Technician

## 2023-05-21 NOTE — Unmapped (Signed)
Spoke with patient and updated with donor situation - patient was made aware by sister that she is 8/8 and willing to donate. Let patient know that we also have unrelated but 7-8/10 matches was the best we can get. We will get his donor signed off during HLA meeting on Thursday. Patient confirmed understanding.

## 2023-05-22 MED FILL — VENCLEXTA 100 MG TABLET: ORAL | 28 days supply | Qty: 84 | Fill #1

## 2023-05-23 ENCOUNTER — Telehealth: Admit: 2023-05-23 | Discharge: 2023-05-24 | Payer: MEDICARE

## 2023-05-23 DIAGNOSIS — C9001 Multiple myeloma in remission: Principal | ICD-10-CM

## 2023-05-23 DIAGNOSIS — R5381 Other malaise: Principal | ICD-10-CM

## 2023-05-23 NOTE — Unmapped (Signed)
Spoke with patient and let him know that the BMT team is requiring him to get PT and improve his functional status to move forward with transplant. He is agreeable with this. Dr. Michelle Nasuti office will put in referral.

## 2023-05-23 NOTE — Unmapped (Signed)
[facility] Return Visit     REASON FOR VISIT: MM follow up; reviewed CBC, electrolytes and other blood tests.    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 Receiving treatment with aza/ven on BEAT AML trial. MRD-negative CR after first cycle. Will repeat BM biopsy 2/27.    ASSESSMENT: 77 y.o. male with standard-risk kappa free light chain multiple myeloma with best response CR, MRD-negative, on lenalidomide maintenance therapy until discontinuation in 08/2022, now receiving treatment for adverse-risk AML as outlined above, presumably treatment-related secondary to melphalan and lenalidomide. No signs/symptoms to suggest progression of multiple myeloma. Due for repeat restaging studies, which have been ordered. We will plan to defer any further multiple myeloma therapy until relapse. The patient has established care with [name] and attained an MRD-negative CR after his first cycle of aza/ven. He is being considered for a RIC allo transplant. If there is future evidence of multiple myeloma progression requiring treatment, would at that time consider adding daratumumab monotherapy to AML-directed therapy.    PLAN:  AML-directed therapy per leukemia team  Defer multiple myeloma-directed therapy at this time  Repeat multiple myeloma restaging studies    Telephone/Video Visit Information:   The patient reports they are physically located in [place] and is currently: at home. I conducted a audio/video visit. I spent  29m 22s on the video call with the patient. I spent an additional 12 minutes on pre- and post-visit activities on the date of service .     [name], MD  Assistant Professor of Medicine  Division of Hematology, Myeloma Program    Nurse Navigator: [place], [place]; [phone]  Questions and appointments M-F 8am - 5pm: [phone] or [phone]        INTERIM HISTORY:   History of Present Illness  The patient presents via virtual visit for evaluation of multiple myeloma. Also under treatment for AML.    The patient reports a significant improvement in his overall health status, feeling stronger than his previous state. He remains on aza/ven for AML and is being considered for a reduced-intensity allo transplant. He states his younger sister has consented to be the primary donor, while his daughter stands as an alternate half-match donor. The patient expresses satisfaction with his current providers.     HEMATOLOGICAL / ONCOLOGICAL HISTORY:    1. Kappa free light chain multiple myeloma (MM), ISS 1, R-ISS 1, MyPRS low risk, no high-risk CA  A. Presentation: lytic lesion of R femur  B. Initial workup:  SPEP irregularity and decrease in the gamma region (no IFE) (01/03/19)  Serum free kappa 125.2 mg/dL, lambda <2.95 mg/dL, ratio >284 (1/32/44)  WBC 9.8, Hgb 14.3, Plts 327  Cr 1.05, Ca 9.4  Beta-2-microglobulin 2.58  Albumin 4.3  LDH 395 (normal)  PET/CT: diffuse moderate FDG uptake throughout axial and proximal appendicular skeleton with innumerable lytic osseous lesions. Nonspecific focus of FDG avidity surrounding R hip prosthesis.   MRI RLE: abnormal marrow signal, 3.3 cm T2 hyperintense enhancing oval lesion with endosteal scalloping at tip  of femoral stem, without cortical breakthrough.  Bone marrow: 70% cellularity; 40-80% plasmacytosis; cytogenetics: normal; FISH monosomy 13, CCND1/IGH fusion [t(11;14]; MyPRS score 35, low risk  C. XRT (02/13/19): 8Gy in single fraction to symptomatic right femur lesions adjacent to hip implant  D. RVD (C1 = 02/19/19; C2 = 03/12/19; C3 = 04/02/19; C4 = 04/23/19): starting kappa FLC 151 mg/dL; best response = VGPR    BM biopsy 05/13/19: 70% cellularity, 10-20% plasmacytosis  E. ASCT, melphalan 140 mg/m2 (05/29/19)   07/09/2019: kappa FLC 8.58 mg/dL   16/08/9603: serum free kappa 5.46 mg/dL, lambda 5.40 mg/dL, ratio 9.81; SPEP/IFE negative; 24h UPEP kappa FLC TLTQ  F. Lenalidomide on [id number] (10/22/19)    12/16/2019: SPEP/IFE negative; serum free kappa 3.02 mg/dL, lambda 9.56 mg/dL, ratio  2.13    08/65/7846: SPEP/IFE negative; serum free kappa 2.58 mg/dL, lambda 9.62 mg/dL, ratio 9.52    84/13/2440: SPEP/IFE negative; serum free kappa 2.72, lambda 1.68, ratio 1.62   09/20/2020: SPEP/IFE negative serum free kappa 3.01, lambda 2.09, ratio 1.44   11/16/2020: SPEP/IFE negative; serum free kappa 2.72, lambda 2.01, FLC ratio 1.35   02/08/2021: SPEP/IFE negative; serum free kappa 2.42, lambda 1.76, FLC ratio 1.38   05/03/2021: SPEP/IFE negative; serum free kappa 2.37 mg/dL, lambda 1.02 mg/dL, ratio 7.25    36/64/4034: SPEP/IFE negative; serum free kappa 3.59 mg/dL, lambda 7.42 mg/dL, ratio 5.95    63/87/5643: BM biopsy -- slightly hypocellular, no morphologic/immunophenotypic evidence of plasma cell neoplasm      08/22/2022: PET/CT -- negative; 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    ALLERGIES: Patient has no known allergies.    MEDICATIONS:    Current Outpatient Medications:     calcium carbonate (TUMS) 200 mg calcium (500 mg) chewable tablet, Chew 2 tablets (400 mg of elem calcium total) Three (3) times a day., Disp: , Rfl:     cholecalciferol, vitamin D3 25 mcg, 1,000 units,, 1,000 unit (25 mcg) tablet, Take 1 tablet (25 mcg total) by mouth., Disp: , Rfl:     cholestyramine (QUESTRAN) 4 gram packet, Take 1 packet by mouth in the morning and 1 packet in the evening. Take with meals. Mix 4 g (1 packet) into 2-3 ounces of fluid and take twice a day.., Disp: 60 packet, Rfl: 5    cyanocobalamin, vitamin B-12, 2,000 mcg Tab, Take 2,000 mcg by mouth in the morning., Disp: 90 tablet, Rfl: 3    loratadine (CLARITIN) 10 mg tablet, Take 1 tablet (10 mg total) by mouth daily., Disp: , Rfl:     metoPROLOL succinate (TOPROL-XL) 50 MG 24 hr tablet, Take 1 tablet (50 mg total) by mouth daily., Disp: 30 tablet, Rfl: 11    multivitamin with minerals tablet, Take 1 tablet by mouth daily., Disp: , Rfl:     omeprazole (PRILOSEC) 40 MG capsule, TAKE 1 CAPSULE(40 MG) BY MOUTH DAILY, Disp: 90 capsule, Rfl: 0    ondansetron (ZOFRAN) 8 MG tablet, Take 1 tablet (8 mg total) by mouth every eight (8) hours as needed for nausea., Disp: 30 tablet, Rfl: 2    sildenafiL, pulm.hypertension, (REVATIO) 20 mg tablet, Take 1-5 tablets (20-100 mg total) by mouth daily as needed (for erectile function)., Disp: 90 tablet, Rfl: 3    tamsulosin (FLOMAX) 0.4 mg capsule, Take 1 capsule (0.4 mg total) by mouth daily., Disp: 90 capsule, Rfl: 0    valACYclovir (VALTREX) 500 MG tablet, Take 1 tablet (500 mg total) by mouth daily., Disp: 90 tablet, Rfl: 3  venetoclax (VENCLEXTA) 100 mg tablet, Take 4 tablets (400 mg total) by mouth daily for 21 days., Disp: 84 tablet, Rfl: 4    docusate sodium (COLACE) 50 MG capsule, Take 1 capsule (50 mg total) by mouth two (2) times a day., Disp: , Rfl:   No current facility-administered medications for this visit.    Facility-Administered Medications Ordered in Other Visits:     DTaP-hepatitis B recombinant-IPV (PEDIARIX) 10 mcg-25Lf-25 mcg-10Lf/0.5 mL injection, , , ,     DTaP-hepatitis B recombinant-IPV (PEDIARIX) 10 mcg-25Lf-25 mcg-10Lf/0.5 mL injection, , , ,     haemophilus B polysac-tetanus toxoid (ActHIB) 10 mcg/0.5 mL injection, , , ,     influenza vaccine quad (FLUARIX, FLULAVAL, FLUZONE) (6 MOS & UP) 2020-21 ADS Med, , , ,     pneumococcal conjugate (13-valent) (PREVNAR-13) 0.5 mL vaccine, , , ,     pneumococcal conjugate (13-valent) (PREVNAR-13) 0.5 mL vaccine, , , ,     varicella-zoster gE-AS01B (PF) (SHINGRIX) 50 mcg/0.5 mL injection, , , ,       SOCIAL HISTORY: No tobacco, alcohol or drug use. 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 Walking for routine exercise.    FAMILY HISTORY: No blood disorders or cancer, sudden cardiac death or unexplained cardiac disease    LABS:  Lab Results   Component Value Date    WBC 2.6 (L) 05/17/2023    HGB 12.4 (L) 05/17/2023    HCT 36.3 (L) 05/17/2023    PLT 146 (L) 05/17/2023    NEUTROABS 0.9 (L) 05/17/2023    LYMPHSABS 1.2 05/17/2023    MCV 101.2 (H) 05/17/2023    NA 140 05/17/2023    K 4.5 05/17/2023    CL 108 (H) 05/17/2023    CO2 28.0 05/17/2023    BUN 19 05/17/2023    CREATININE 0.88 05/17/2023    GLU 126 05/17/2023    CALCIUM 8.9 05/17/2023    MG 1.7 04/16/2023    PHOS 3.0 04/16/2023    URICACID 3.5 (L) 04/16/2023    BILITOT 0.8 05/17/2023    BILIDIR <0.10 07/03/2019    PROT 6.6 05/17/2023    ALBUMIN 3.9 05/17/2023    ALT 42 05/17/2023    AST 35 (H) 05/17/2023    ALKPHOS 88 05/17/2023    GGT 64 06/28/2019    INR 0.93 04/16/2023    APTT 28.5 04/16/2023    B2MG S 2.32 08/24/2020    LDH 186 04/16/2023    IGG 992 02/08/2023    IGM 9 (L) 02/08/2023    IGA 29.2 (L) 02/08/2023    SPEINTERP  02/08/2023      Comment:      The SPE pattern demonstrates a slight irregularity in the gamma region, which may represent monoclonal protein. See Immunofixation report.      IFE  02/08/2023      Comment:      No monoclonal protein detected.      KAPFS 1.07 02/08/2023    LAMFS 0.64 02/08/2023    RATKL 1.67 (H) 02/08/2023    IFEUR  05/13/2019      Comment:      Monoclonal component typed as free Kappa light chains.               Lab Results   Component Value Date    HEPBSAB Nonreactive 09/03/2019    HEPBCAB Nonreactive 04/16/2023    HEPCAB Nonreactive 04/16/2023

## 2023-05-24 NOTE — Unmapped (Addendum)
-----   Message from Aris Everts, MD sent at 05/23/2023  4:22 PM EDT -----  Thank you! If we agree that his stabilized/improved after 1-2 months of PT, then from my standpoint he is ok to move forward.  ----- Message -----  From: Pixie Casino, MD  Sent: 05/23/2023   9:27 AM EDT  To: Elijah Birk, RN; #    Hi, he may need PT for 1-2 months to demonstrate enough functional improvement. I am happy to put in the referral if he is agreeable  ----- Message -----  From: Elijah Birk, RN  Sent: 05/22/2023   3:49 PM EDT  To: Pixie Casino, MD; #    Hello Dr. Sudie Bailey,    Our BMT team decided to require patient to follow your recommendations to be able to proceed with transplant. We see your recommendations as follows:    a. Agree with use of DME  b. Declined formal PT referral   c. Encouraged increased mobility and walking  d. Discussed exercises to do including squats and balance exercises  e. Encouraged protein intake    Patient will need PT. Will you be able to  assist with this? How long do we need for PT? Let me know and I can update patient with BMT team recs.    Thank you,  Monica Martinez

## 2023-05-28 NOTE — Unmapped (Signed)
Spoke with patient and let him know that he is scheduled with PT on 7/11 at 8:30 am Patient confirmed understanding.

## 2023-06-05 ENCOUNTER — Ambulatory Visit
Admit: 2023-06-05 | Payer: MEDICARE | Attending: Rehabilitative and Restorative Service Providers" | Primary: Rehabilitative and Restorative Service Providers"

## 2023-06-05 ENCOUNTER — Ambulatory Visit
Admit: 2023-06-05 | Discharge: 2023-07-04 | Payer: MEDICARE | Attending: Rehabilitative and Restorative Service Providers" | Primary: Rehabilitative and Restorative Service Providers"

## 2023-06-05 NOTE — Unmapped (Signed)
NEUROLOGICAL OUTPATIENT PHYSICAL THERAPY   EVALUATION AND PLAN OF CARE    Patient Name: Timothy Porter  Date of Birth:1946/07/23  Date: 06/05/2023  Session Number:  1  Therapy Diagnosis:   Encounter Diagnoses   Name Primary?    Physical deconditioning     Impaired functional mobility, balance, and endurance Yes    Multiple myeloma, remission status unspecified (CMS-HCC)        Date of Injury/Onset: Aug 2020  Date of Evaluation: 06/05/2023  Referring Pracitioner: Lindwood Qua*  Certification Dates: 06/06/23-08/28/23        ASSESSMENT:    77 y.o. 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 Patient presents to this clinic to improve balance, strength, and endurance in preparation for a stem cell transplant. The patient completes a 5xSTS in 20.3 sec with UE support. This is above the normative value of 12.6 sec without UE support for community-dwelling adults in his age range which suggests the patient demonstrates decreased functional LE strength. Patient stands with forward flexed posture and ambulates with shortened step length suggestive of decreased hip extensor activation. He demonstrates increased instability in standing when talking or holding a conversation suggesting an increased fall risk when cognitive resources are deviated away from balance. Patient will benefit from skilled Physical Therapy services for balance training, bilateral LE strengthening, endurance, gait training, postural re-training, and education.     Plan for next session: Cognitive TUG, , LE strengthening HEP, eyes closed/open ambulation, dual-task ambulation    Problem List: decreased bilateral LE strength, impaired balance, impaired posture, deconditioning, and impaired functional mobility    Prognosis:  Good   Positive Indicators: behavior, good caregiver/family support, motivation, history of compliance, and prior level of function   Negative Indicators: medial status/condition and impaired balance    Personal Factors/Comorbidities Present: Bilateral THA , HTN, multiple myeloma, R femoral lytic lesion, acute myeloid leukemia (AML)    Examination of Body Systems: balance, ROM, strength, posture    Clinical Decision Making: High Complexity Eval Code:  Data from the patient???s history indicates 3 or more personal factors or comorbidities that will affect the treatment of the current problem. Examination of body systems reveals 4 or more body structures, functions, activity limitations and/or participation restrictions and the clinical presentation is unstable. The results of a standardized functional test or outcome measure indicates the clinical decision making was of high complexity.       Goals    Patient/Family Goals:  floor transfers, balance, preparing for the stem cell transplant    Short Term Goals:  In  12  visits:  Patient will be able to properly demonstrate current HEP independently x1 in clinic to build upon functional gains in PT.   Patient will stand on foam with feet together and eyes closed for 30 sec with minimal sway in open gym space to demonstrate increased confidence in balance.  Patient will complete the Timed Up and Go test without an assistive device in < 13.5 seconds to minimize the risk of falls.  Patient will navigate 8 stairs with unilateral handrail, mod I.   Patient will navigate 6 outdoor curb with assistive device.  Patient will perform floor transfer with external support with SBA.   Patient will stoop to pick up 10 objects off the ground with assistive device.     Long Term Goals:  In 24  visits:  Patient will be able  to properly demonstrate updated HEP independently x1 in clinic to build upon functional gains in PT.   Patient will complete 5xSTS without UE support in <15 seconds to demonstrate improved functional LE strength.  Patient will complete the Cognitive Timed Up and Go test without an assistive device in < 17.5 seconds to minimize the risk of falls with increased cognitive demand.   Patient will navigate 16 stairs with unilateral handrail, mod I.   Patient will increase bilateral hip flexor strengthening to 4+/5.  Patient will perform floor transfer without external support with supervision.  Patient will complete the in >1000 ft with SPC.    PLAN/Recommendations: 3 x a week for 8 weeks  Planned Interventions: Manual Therapy  Gait Training  Therapeutic Activites  Neuromuscular Education  Self-Care  Physical Performance Test  Therapeutic exercise  Balance training  Postural exercises/education  Education  Dry Needling as needed  PT DME/Equipment Recs: none    Home Exercise Program:  -Feet together, eyes open,  3 x hold 30 sec    SUBJECTIVE:  Pt states:   -ended physical therapy due to leukemia diagnosis December 2023  -uses grabber to pick up objects off the floor  -feels unsteady when going down an escalator, going down stairs  -difficulty putting on shoes and socks due to second hip replacement (L THA)  -intermittent fatigue (someone mows the lawn; given away a lot of activities)  -Just completed 21 days of chemotherapy    Reason for Referral/History of Present Condition/Onset of injury/exacerbation:   77 y.o. 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 Referral for conditioning, mobility, BLE strengthening/stretching, balance, core strengthening. Create home exercise program. Patient diagnosed with multiple myeloma in August 2020. He was found to have progressive pancytopenia in Sept 2023 and BMbx was done. 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 He enrolled on BAML S12 arm (ven x 28 days) and began treatment on 11/22/2022. After 1 cycle of treatment he was found to be in complete remission without minimal residual disease. 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 Plans to defer any further multiple myeloma therapy until relapse. He is being considered for a [facility]IC allo stem cell transplant.    Last Fall: June 2024 around pool, needed external support but was able to stand  Precautions: Cancer, Chemotherapy  Red Flags:  none    Prior Functional Status: Working and on feet all the time. Fatigued and then they found the leukemia.    Current Functional Status: Walk 20-minutes per day with cane, half hour watering plants, Tuesdays does laundry. Sometimes uses [employer] for stabilizing. UE and LE resistance bands daily. 7,000 steps/day and 2-3 errands per day.    Currently used Equipment: SPC (only when leaving the house). Has heated pool, treadmill, eliptical  Previous Treatment: PT in 2022-2023 for deconditioning due to multiple myeloma, PT in 2023 for L THA    Social History: Lives with spouse in [place] house with 0 STE, small threshold. 5 steps going into pool unilateral.  Caregiver availability, capability, willingness: Wife retired at home  Independent w/ ADLs?: Yes.   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   -Wife Lurena Joiner) is a retired     Patient???s communication preference: Verbal, Barrister's clerk, and Visual    Barriers to Learning:  n/a  Recent Procedures/Tests/Findings  PET CT Whole Body Scan: 08/22/22    1.No definite evidence of hypermetabolic osseous disease within the limitations of this exam as above.   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          Past Medical History:   Diagnosis Date    Benign prostatic hyperplasia with lower urinary tract symptoms 11/27/2018    BPH (benign prostatic hyperplasia)     Diarrhea     Fatigue     Fractures     left arm    Glucose intolerance     May 2019: HbA1c 6.2.    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 CMV negative 04/07/20.     Pancytopenia (CMS-HCC) 03/14/2023    Prediabetes      Family History   Problem Relation Age of Onset    Dementia Father     No Known Problems Other        Past Surgical History:   Procedure Laterality Date    FRACTURE SURGERY      HERNIA REPAIR      HIP SURGERY      JOINT REPLACEMENT      PR COLONOSCOPY W/BIOPSY SINGLE/MULTIPLE N/A 01/01/2018    Procedure: COLONOSCOPY, FLEXIBLE, PROXIMAL TO SPLENIC FLEXURE; WITH BIOPSY, SINGLE OR MULTIPLE;  Surgeon: Bronson Curb, MD;  Location: [facility];  Service: Gastroenterology    PR IMPLANT MESH HERNIA REPAIR/DEBRIDEMENT CLOSURE  05/07/2017    Procedure: implantation of mesh;  Surgeon: Judithann Graves, MD;  Location: MAIN OR [facility];  Service: Trauma    PR REPAIR ING HERNIA,5+Y/O,REDUCIBL Left 05/07/2017    Procedure: Repair direct and indirect inguinal hernia with mesh;  Surgeon: Judithann Graves, MD;  [facility];  Service: Trauma    PR TOTAL HIP ARTHROPLASTY Right 10/14/2015    Procedure: ARTHROPLASTY, ACETABULAR & PROXIMAL FEMORAL PROSTHETIC REPLACEMENT (TOTAL HIP), W/WO AUTOGRAFT/ALLOGRAFT;  Surgeon: Malka So, MD;  Location: [facility] [place];  Service: Orthopedics    VARICOSE VEIN SURGERY Right       No Known Allergies     Social History     Tobacco Use    Smoking status: Never    Smokeless tobacco: Never   Substance Use Topics    Alcohol use: Yes     Comment: 1 drink a month      Current Outpatient Medications   Medication Sig Dispense Refill    calcium carbonate (TUMS) 200 mg calcium (500 mg) chewable tablet Chew 2 tablets (400 mg of elem calcium total) Three (3) times a day.      cholecalciferol, vitamin D3 25 mcg, 1,000 units,, 1,000 unit (25 mcg) tablet Take 1 tablet (25 mcg total) by mouth.      cholestyramine (QUESTRAN) 4 gram packet Take 1 packet by mouth in the morning and 1 packet in the evening. Take with meals. Mix 4 g (1 packet) into 2-3 ounces of fluid and take twice a day.. 60 packet 5    cyanocobalamin, vitamin B-12, 2,000 mcg Tab Take 2,000 mcg  by mouth in the morning. 90 tablet 3    docusate sodium (COLACE) 50 MG capsule Take 1 capsule (50 mg total) by mouth two (2) times a day.      loratadine (CLARITIN) 10 mg tablet Take 1 tablet (10 mg total) by mouth daily.      metoPROLOL succinate (TOPROL-XL) 50 MG 24 hr tablet Take 1 tablet (50 mg total) by mouth daily. 30 tablet 11    multivitamin with minerals tablet Take 1 tablet by mouth daily.      omeprazole (PRILOSEC) 40 MG capsule TAKE 1 CAPSULE(40 MG) BY MOUTH DAILY 90 capsule 0    ondansetron (ZOFRAN) 8 MG tablet Take 1 tablet (8 mg total) by mouth every eight (8) hours as needed for nausea. 30 tablet 2    sildenafiL, pulm.hypertension, (REVATIO) 20 mg tablet Take 1-5 tablets (20-100 mg total) by mouth daily as needed (for erectile function). 90 tablet 3    tamsulosin (FLOMAX) 0.4 mg capsule Take 1 capsule (0.4 mg total) by mouth daily. 90 capsule 0    valACYclovir (VALTREX) 500 MG tablet Take 1 tablet (500 mg total) by mouth daily. 90 tablet 3    venetoclax (VENCLEXTA) 100 mg tablet Take 4 tablets (400 mg total) by mouth daily for 21 days. 84 tablet 4     No current facility-administered medications for this visit.     Facility-Administered Medications Ordered in Other Visits   Medication Dose Route Frequency Provider Last Rate Last Admin    DTaP-hepatitis B recombinant-IPV (PEDIARIX) 10 mcg-25Lf-25 mcg-10Lf/0.5 mL injection             DTaP-hepatitis B recombinant-IPV (PEDIARIX) 10 mcg-25Lf-25 mcg-10Lf/0.5 mL injection             haemophilus B polysac-tetanus toxoid (ActHIB) 10 mcg/0.5 mL injection             influenza vaccine quad (FLUARIX, FLULAVAL, FLUZONE) (6 MOS & UP) 2020-21 [facility]S Med             pneumococcal conjugate (13-valent) (PREVNAR-13) 0.5 mL vaccine             pneumococcal conjugate (13-valent) (PREVNAR-13) 0.5 mL vaccine             varicella-zoster gE-AS01B (PF) (SHINGRIX) 50 mcg/0.5 mL injection                     OBJECTIVE :  Posture:    Sitting: rounded shoulders and foward head   Standing: foward flexed posture, rounded shoulders, foward heard, increased thoracic kyphosis, and genu valgum  Skin assessment/Edema: n/a    Pain  Current:  0/10     Vitals:  BP: 124/68 mmHg  SpO2: 96%  HR: 96 bpm    Sensation:  Numbness and tingling on 5th digit on L hand     Vision:  Wears glasses    Vestibular:    Denies dizziness    Range of Motion:   UE WNL    Strength/MMT:  UE MMT   UE MMT Left Right   Shoulder flex: 5/5 5/5   Shoulder abd: (C5) 5/5 5/5   Elbow flex:  (C6) 5/5 5/5   Elbow ext: (C7) 5/5 5/5   Wrist ext: (C6) 5/5 5/5   Finger Flex/Grasp:  5/5 5/5         LE MMT   LE MMT Left Right   Hip flex:  (L2) 3/5 3/5   Knee ext:  (L3) 5/5 5/5   Knee  flex: (S2) 4/5 4/5   Ankle DF:  (L4) 5/5 5/5        Motor Function/Coordination:   Normal Rapid alternating movement   Toe taps: intact    Transfers:    Sit<>Stand: indep with UE support     Balance:  Assess with modified CTSIB    Gait Analysis:   Shortened bilateral step length  Decreased bilateral hip extension  Forward flexed posture  Decreased gait speed       Functional Tests/Outcome Measures:      5xSTS:  20.3 sec, UE support  Unable to complete without UEs    mCTSIB:  mCTSIB:  Firm surface:              Feet together, eyes open: 30 seconds with min sway              Feet together, eyes closed: 30 seconds with mod sway     Foam surface:              Feet together, eyes open: 30 seconds with mod sway              Feet together, eyes closed: 30 seconds with significant sway       Patient Education: Throughout evaluation patient educated regarding the following: Role of PT in Rehabilitation, HEP, and treatment plan. Patient demonstrated and verbalized agreement and understanding.        Communication/consultation with other professionals:  medicare Cert/POC sent to referring practitioner    Total Time: 45 min   PT Evaluation: 40 min   Treatment Rendered:    Therapeutic Exercise: 5 mins - Home Exercise Program  -Feet together, eyes open,  3 x hold 30 sec    I attest that I have reviewed the above information.  Signed: Ellouise Newer, SPT  06/05/2023 1:56 PM    The care for this patient was completed by Eulas Post, PT:  A student was present and participated in the care. Licensed/Credentialed therapist was physically present and immediately available to direct and supervise tasks that were related to patient management. The direction and supervision was continuous throughout the time these tasks were performed.    Eulas Post, PT

## 2023-06-06 NOTE — Unmapped (Signed)
NEUROLOGICAL OUTPATIENT PHYSICAL THERAPY   DAILY NOTE    Patient Name: Timothy Porter  Date of Birth:1946-04-13  Date: 06/06/2023  Session Number:  2 (1/10 to reassessment)  Therapy Diagnosis:   Encounter Diagnoses   Name Primary?    Multiple myeloma, remission status unspecified (CMS-HCC) Yes    Impaired functional mobility, balance, and endurance     Physical deconditioning     Muscle weakness          Date of Injury/Onset: Aug 2020  Date of Evaluation: 06/05/2023  Referring Pracitioner: Lindwood Qua*  Certification Dates: 06/06/23-08/28/23      ASSESSMENT:    77 y.o. 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 Patient presents to this clinic to improve balance, strength, and endurance in preparation for a stem cell transplant. The patient completes a 5xSTS in 20.3 sec with UE support. This is above the normative value of 12.6 sec without UE support for community-dwelling adults in his age range which suggests the patient demonstrates decreased functional LE strength. Patient stands with forward flexed posture and ambulates with shortened step length suggestive of decreased hip extensor activation. He demonstrates increased instability in standing when talking or holding a conversation suggesting an increased fall risk when cognitive resources are deviated away from balance.    Today's session focused on further assessment of endurance and dual-tasking with ambulation. Patient ambulates 997 ft on the with a SPC today. This patient last completed a on 04/16/23 with pulmonary rehhaband ambulated 1076 ft with a rolling walker. The patient demonstrates improvement in terms of using a lesser restrictive assistive device.The patient has remained relatively consistent with his endurance considering he ambulated with a lesser restrictive assistive device on this date. The patient completes the timed up and go test in 14.5 sec without an assistive device and did not demonstrate a significant change in performance when provided a simultaneous cognitive task. This suggests that increasing cognitive demand does not significantly affect motor performance. The patient was provided with an updated HEP LE with a focus on minimizing bilateral genu valgum during functional mobility and LE strengthening activities. The patient will continue to benefit from skilled Physical Therapy services for balance training, bilateral LE strengthening, endurance, gait training, postural re-training, and education.    Prior Objective Testing  Tests Eval (06/05/23) 06/06/23   5xSTS 20.3 sec, UE support -   - 997 ft  With SPC   TUG and Cognitive TUG - Without AD:  TUG: 14.5 sec  Cognitive TUG:   15.4 sec         Plan for next session:  HEP (side-lying hip abduction, bridging, prone extensions, banded seated marching), eyes closed/open ambulation, stair navigation (descending is primary concern),     Problem List: decreased bilateral LE strength, impaired balance, impaired posture, deconditioning, and impaired functional mobility    Personal Factors/Comorbidities Present: Bilateral THA , HTN, multiple myeloma, R femoral lytic lesion, acute myeloid leukemia (AML)      Goals    Patient/Family Goals:  floor transfers, balance, preparing for the stem cell transplant    Short Term Goals:  In  12  visits:  Patient will be able to properly demonstrate current HEP independently x1 in clinic to build upon functional gains in PT.   Patient will stand on foam with feet together and eyes closed for 30 sec with minimal sway in open gym  space to demonstrate increased confidence in balance.  Patient will complete the Timed Up and Go test without an assistive device in < 13.5 seconds to minimize the risk of falls.  Patient will navigate 8 stairs with unilateral handrail, mod I.   Patient will navigate 6 outdoor curb with assistive device.  Patient will perform floor transfer with external support with SBA.   Patient will stoop to pick up 10 objects off the ground with assistive device.     Long Term Goals:  In 24  visits:  Patient will be able to properly demonstrate updated HEP independently x1 in clinic to build upon functional gains in PT.   Patient will complete 5xSTS without UE support in <15 seconds to demonstrate improved functional LE strength.  Patient will complete the Cognitive Timed Up and Go test without an assistive device in < 17.5 seconds to minimize the risk of falls with increased cognitive demand.   Patient will navigate 16 stairs with unilateral handrail, mod I.   Patient will increase bilateral hip flexor strengthening to 4+/5.  Patient will perform floor transfer without external support with supervision.  Patient will complete the in >1000 ft with SPC.    PLAN/Recommendations: 3 x a week for 8 weeks  Planned Interventions: Manual Therapy  Gait Training  Therapeutic Activites  Neuromuscular Education  Self-Care  Physical Performance Test  Therapeutic exercise  Balance training  Postural exercises/education  Education  Dry Needling as needed  PT DME/Equipment Recs: none    Home Exercise Program:  -Feet together, eyes open,  3 x hold 30 sec  -standing against wall with scapular retraction for posture and standing endurance, 3 x 1 min  -Mini squats with green band around knees, 3 x 10    SUBJECTIVE:  Pt states:   -20-minutes walk everyday with cane, walked this morning    Reason for Referral/History of Present Condition/Onset of injury/exacerbation:   77 y.o. 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 Referral for conditioning, mobility, BLE strengthening/stretching, balance, core strengthening. Create home exercise program. Patient diagnosed with multiple myeloma in August 2020. He was found to have progressive pancytopenia in Sept 2023 and BMbx was done. 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 He enrolled on BAML S12 arm (ven x 28 days) and began treatment on 11/22/2022. After 1 cycle of treatment he was found to be in complete remission without minimal residual disease. 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 Plans to defer any further multiple myeloma therapy until relapse. He is being considered for a RIC allo stem cell transplant.    Last Fall: June 2024 around pool, needed external support but was able to stand  Precautions: Cancer, Chemotherapy  Red Flags:  none    Prior Functional Status: Working and on feet all the time. Fatigued and then they found the leukemia.    Current Functional Status: Walk 20-minutes per day with cane, half hour watering plants, Tuesdays does laundry. Sometimes uses [employer] for stabilizing. UE and LE resistance bands daily. 7,000 steps/day and 2-3 errands per day.    Currently used Equipment: SPC (only when leaving the house). Has heated pool, treadmill, eliptical  Previous Treatment: PT in 2022-2023 for deconditioning due to multiple myeloma, PT in 2023 for L THA    Social History: Lives with [place]  in [place] house with 0 STE, small threshold. 5 steps going into pool unilateral.  Caregiver availability, capability, willingness: Wife retired at home  Independent w/ ADLs?: Yes.   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   -Wife Lurena Joiner) is a retired     OBJECTIVE :  Pain  Current:  0/10     Falls: None    Vitals: Pre-session:  BP: 123/66 mmHg  SpO2: 98%  HR: 90 bpm    PPTM x 30 min:  7  6 Minute Walk Test ( ):  Distance: 997 feet  Assistive Device: SPC  RPE: 7/10    Normative Reference Values by Age:  Age Distance (in feet) -   Male Distance (in feet) -   Male   60 - 69 years  1877 ft 1765 ft   70 - 79 years  1729 ft 1545 ft   80 - 99 years  1368 ft 1286 ft   *small meaningful change: 66 feet  *substantial meaningful change: 164 feet    Timed Up and Go (TUG): 14.5 seconds (without AD).  >13.5 sec indicates increased risk of falling in community-dwelling older adults.      Normative Reference Values by Age  Age Time (in seconds)   60 - 69 years  8.1 (7.1 - 9.0)    70 - 79 years  9.2 (8.2 - 10.2)    80 - 99 years  11.3 (10.0 - 12.7)        Cognitive TUG: 15.4 sec (without assistive device)      There Ex x 15 min:  Updated HEP:  -Standing endurance and postural alignment, 1 min x 2   -Stand against wall with scapular retraction   -Attempted chin tucks in standing with demonstration and verbal cuing. Patient tends to compensate with shoulder elevation. Chin tucks discontinued.   -Patient challenged with standing endurance. Requires seated rest breaks.  -Mini squats with green band around proximal knees, x 10   -To address bilateral knee valgum with sit-to-stand transfers and to strengthen hip abductors and external rotators   -Requires repeated verbal and tactile cuing and will benefit from further practice to engage hip abductors and external rotators.    Patient Education: Throughout evaluation patient educated regarding the following: Role of PT in Rehabilitation, HEP, and treatment plan. Patient demonstrated and verbalized agreement and understanding.        Total Time: 45 min   Treatment Rendered:    See above    I attest that I have reviewed the above information.  SignedEllouise Newer, SPT  06/06/2023 8:36 AM    The care for this patient was completed by Eulas Post, PT:  A student was present and participated in the care. Licensed/Credentialed therapist was physically present and immediately available to direct and supervise tasks that were related to patient management. The direction and supervision was continuous throughout the time these tasks were performed.    Eulas Post, PT

## 2023-06-07 DIAGNOSIS — N400 Enlarged prostate without lower urinary tract symptoms: Principal | ICD-10-CM

## 2023-06-07 MED ORDER — TAMSULOSIN 0.4 MG CAPSULE
ORAL_CAPSULE | 0 refills | 0 days
Start: 2023-06-07 — End: ?

## 2023-06-08 DIAGNOSIS — N400 Enlarged prostate without lower urinary tract symptoms: Principal | ICD-10-CM

## 2023-06-08 MED ORDER — TAMSULOSIN 0.4 MG CAPSULE
ORAL_CAPSULE | Freq: Every day | ORAL | 0 refills | 90.00000 days | Status: CP
Start: 2023-06-08 — End: 2023-06-08

## 2023-06-08 NOTE — Unmapped (Signed)
NEUROLOGICAL OUTPATIENT PHYSICAL THERAPY   DAILY NOTE    Patient Name: Timothy Porter  Date of Birth:Jan 07, 1946  Date: 06/08/2023  Session Number:  3 (2/10 to reassessment)  Therapy Diagnosis:   Encounter Diagnoses   Name Primary?    Multiple myeloma, remission status unspecified (CMS-HCC) Yes    Impaired functional mobility, balance, and endurance     Physical deconditioning     Muscle weakness            Date of Injury/Onset: Aug 2020  Date of Evaluation: 06/05/2023  Referring Pracitioner: Lindwood Qua*  Certification Dates: 06/06/23-08/28/23      ASSESSMENT:    77 y.o. 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 Patient presents to this clinic to improve balance, strength, and endurance in preparation for a stem cell transplant.     Today's session focused on HEP review/progression. The patient continues to require cues to maintain optimal knee alignment when performing closed-chain strengthening exercises. Standing BP did not indicate orthostatic hypotension, but the patient was educated about conservative measures given report of dizziness with prolonged standing as well as hx of venous stripping.     The patient will continue to benefit from skilled Physical Therapy services for balance training, bilateral LE strengthening, endurance, gait training, postural re-training, and education.    Prior Objective Testing  Tests Eval (06/05/23) 06/06/23   5xSTS 20.3 sec, UE support -   - 997 ft  With SPC   TUG and Cognitive TUG - Without AD:  TUG: 14.5 sec  Cognitive TUG:   15.4 sec         Plan for next session:  sit to stand technique, eyes closed/open ambulation, stair navigation (descending is primary concern),     Problem List: decreased bilateral LE strength, impaired balance, impaired posture, deconditioning, and impaired functional mobility    Personal Factors/Comorbidities Present: Bilateral THA , HTN, multiple myeloma, R femoral lytic lesion, acute myeloid leukemia (AML)      Goals    Patient/Family Goals:  floor transfers, balance, preparing for the stem cell transplant    Short Term Goals:  In  12  visits:  Patient will be able to properly demonstrate current HEP independently x1 in clinic to build upon functional gains in PT.   Patient will stand on foam with feet together and eyes closed for 30 sec with minimal sway in open gym space to demonstrate increased confidence in balance.  Patient will complete the Timed Up and Go test without an assistive device in < 13.5 seconds to minimize the risk of falls.  Patient will navigate 8 stairs with unilateral handrail, mod I.   Patient will navigate 6 outdoor curb with assistive device.  Patient will perform floor transfer with external support with SBA.   Patient will stoop to pick up 10 objects off the ground with assistive device.     Long Term Goals:  In 24  visits:  Patient will be able to properly demonstrate updated HEP independently x1 in clinic to build upon functional gains in PT.   Patient will complete 5xSTS without UE support in <15 seconds to demonstrate improved functional LE strength.  Patient will complete the Cognitive Timed Up and Go test without an assistive device in < 17.5 seconds to minimize the risk of falls with increased cognitive demand.   Patient will navigate 16 stairs with unilateral handrail, mod  I.   Patient will increase bilateral hip flexor strengthening to 4+/5.  Patient will perform floor transfer without external support with supervision.  Patient will complete the in >1000 ft with SPC.    PLAN/Recommendations: 3 x a week for 8 weeks  Planned Interventions: Manual Therapy  Gait Training  Therapeutic Activites  Neuromuscular Education  Self-Care  Physical Performance Test  Therapeutic exercise  Balance training  Postural exercises/education  Education  Dry Needling as needed  PT DME/Equipment Recs: none    Home Exercise Program:  -Feet together, eyes open,  3 x hold 30 sec  -standing against wall with scapular retraction for posture and standing endurance, 3 x 1 min  -Mini squats with green band around knees, 3 x 10  - Bridge 3 x10  - side-lying hip abduction 3 x10  - banded seated marching 3 x10    SUBJECTIVE:  Pt states:   -His knees were a little sore from the extra activity, but nothing significant.     Reason for Referral/History of Present Condition/Onset of injury/exacerbation:   77 y.o. 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 Referral for conditioning, mobility, BLE strengthening/stretching, balance, core strengthening. Create home exercise program. Patient diagnosed with multiple myeloma in August 2020. He was found to have progressive pancytopenia in Sept 2023 and BMbx was done. 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 He enrolled on BAML S12 arm (ven x 28 days) and began treatment on 11/22/2022. After 1 cycle of treatment he was found to be in complete remission without minimal residual disease. 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 Plans to defer any further multiple myeloma therapy until relapse. He is being considered for a RIC allo stem cell transplant.    Last Fall: June 2024 around pool, needed external support but was able to stand  Precautions: Cancer, Chemotherapy  Red Flags:  none    Prior Functional Status: Working and on feet all the time. Fatigued and then they found the leukemia.    Current Functional Status: Walk 20-minutes per day with cane, half hour watering plants, Tuesdays does laundry. Sometimes uses [employer] for stabilizing. UE and LE resistance bands daily. 7,000 steps/day and 2-3 errands per day.    Currently used Equipment: SPC (only when leaving the house). Has heated pool, treadmill, eliptical  Previous Treatment: PT in 2022-2023 for deconditioning due to multiple myeloma, PT in 2023 for L THA    Social History: Lives with spouse in [place] house with 0 STE, small threshold. 5 steps going into pool unilateral.  Caregiver availability, capability, willingness: Wife retired at home  Independent w/ ADLs?: Yes.   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   -Wife Lurena Joiner) is a retired     OBJECTIVE :  Pain  Current:  2-3/10 knee pain    Falls: None    Vitals: Pre-session:  BP: 121/69 mmHg  HR: 85 bpm    There Act: 8 min    Standing vitals (after 2 minutes, for orthostatic hypotension testing):  BP: 119/62 mmHg  HR: 93 bpm    Discussed conservative measures for orthostatic hypotension given hx of venous stripping as well as dizziness with prolonged standing.       There Ex x 32 min:    Pt instructed in  HEP, with modeling and VC for technique:   Bridge 3 x10  side-lying hip abduction 3 x10  banded seated marching 3 x10, while maintaining knee alignment    HEP review:  -Mini squats with green band around proximal knees, x 10   -To address bilateral knee valgum with sit-to-stand transfers and to strengthen hip abductors and external rotators   -Requires repeated verbal and tactile cuing and will benefit from further practice to engage hip abductors and external rotators.    The patient was provided with an updated written home exercise program, which was reviewed with the patient. The patient verbalized understanding of all exercises and instructions.        Patient Education: Throughout evaluation patient educated regarding the following: Role of PT in Rehabilitation, HEP, and treatment plan. Patient demonstrated and verbalized agreement and understanding.        Total Time: 40 min   Treatment Rendered:    See above    I attest that I have reviewed the above information.  Signed: Eulas Post, PT  06/08/2023 10:39 AM

## 2023-06-08 NOTE — Unmapped (Signed)
Please refill if appropriate.     Most recent clinic visit: 05/14/2023  Next clinic visit: 06/11/2023

## 2023-06-11 ENCOUNTER — Ambulatory Visit: Admit: 2023-06-11 | Discharge: 2023-06-12 | Payer: MEDICARE

## 2023-06-11 ENCOUNTER — Other Ambulatory Visit: Admit: 2023-06-11 | Discharge: 2023-06-12 | Payer: MEDICARE

## 2023-06-11 ENCOUNTER — Encounter
Admit: 2023-06-11 | Discharge: 2023-06-12 | Payer: MEDICARE | Attending: Nurse Practitioner | Primary: Nurse Practitioner

## 2023-06-11 DIAGNOSIS — C9200 Acute myeloblastic leukemia, not having achieved remission: Principal | ICD-10-CM

## 2023-06-11 DIAGNOSIS — C9201 Acute myeloblastic leukemia, in remission: Principal | ICD-10-CM

## 2023-06-11 DIAGNOSIS — C9000 Multiple myeloma not having achieved remission: Principal | ICD-10-CM

## 2023-06-11 DIAGNOSIS — C9001 Multiple myeloma in remission: Principal | ICD-10-CM

## 2023-06-11 LAB — CBC W/ AUTO DIFF
BASOPHILS ABSOLUTE COUNT: 0 10*9/L (ref 0.0–0.1)
BASOPHILS RELATIVE PERCENT: 0.9 %
EOSINOPHILS ABSOLUTE COUNT: 0 10*9/L (ref 0.0–0.5)
EOSINOPHILS RELATIVE PERCENT: 0 %
HEMATOCRIT: 37.1 % — ABNORMAL LOW (ref 39.0–48.0)
HEMOGLOBIN: 13.1 g/dL (ref 12.9–16.5)
LYMPHOCYTES ABSOLUTE COUNT: 1.3 10*9/L (ref 1.1–3.6)
LYMPHOCYTES RELATIVE PERCENT: 49.6 %
MEAN CORPUSCULAR HEMOGLOBIN CONC: 35.3 g/dL (ref 32.0–36.0)
MEAN CORPUSCULAR HEMOGLOBIN: 35.2 pg — ABNORMAL HIGH (ref 25.9–32.4)
MEAN CORPUSCULAR VOLUME: 99.8 fL — ABNORMAL HIGH (ref 77.6–95.7)
MEAN PLATELET VOLUME: 7.2 fL (ref 6.8–10.7)
MONOCYTES ABSOLUTE COUNT: 0.4 10*9/L (ref 0.3–0.8)
MONOCYTES RELATIVE PERCENT: 14.1 %
NEUTROPHILS ABSOLUTE COUNT: 0.9 10*9/L — ABNORMAL LOW (ref 1.8–7.8)
NEUTROPHILS RELATIVE PERCENT: 35.4 %
PLATELET COUNT: 243 10*9/L (ref 150–450)
RED BLOOD CELL COUNT: 3.72 10*12/L — ABNORMAL LOW (ref 4.26–5.60)
RED CELL DISTRIBUTION WIDTH: 15.4 % — ABNORMAL HIGH (ref 12.2–15.2)
WBC ADJUSTED: 2.6 10*9/L — ABNORMAL LOW (ref 3.6–11.2)

## 2023-06-11 LAB — MONOCLONAL GAMMOPATHY CHEMISTRIES
GAMMAGLOBULIN; IGA: 20 mg/dL — ABNORMAL LOW (ref 70.0–400.0)
GAMMAGLOBULIN; IGG: 857 mg/dL (ref 650–1600)
PROTEIN TOTAL (SPECIAL CHEM): 6.4 g/dL (ref 5.7–8.2)

## 2023-06-11 LAB — COMPREHENSIVE METABOLIC PANEL
ALBUMIN: 3.8 g/dL (ref 3.4–5.0)
ALKALINE PHOSPHATASE: 85 U/L (ref 46–116)
ALT (SGPT): 44 U/L (ref 10–49)
ANION GAP: 6 mmol/L (ref 5–14)
AST (SGOT): 35 U/L — ABNORMAL HIGH (ref <=34)
BILIRUBIN TOTAL: 0.8 mg/dL (ref 0.3–1.2)
BLOOD UREA NITROGEN: 19 mg/dL (ref 9–23)
BUN / CREAT RATIO: 21
CALCIUM: 8.8 mg/dL (ref 8.7–10.4)
CHLORIDE: 107 mmol/L (ref 98–107)
CO2: 30 mmol/L (ref 20.0–31.0)
CREATININE: 0.91 mg/dL
EGFR CKD-EPI (2021) MALE: 88 mL/min/{1.73_m2} (ref >=60)
GLUCOSE RANDOM: 119 mg/dL (ref 70–179)
POTASSIUM: 4.9 mmol/L (ref 3.5–5.1)
PROTEIN TOTAL: 6.4 g/dL (ref 5.7–8.2)
SODIUM: 143 mmol/L (ref 135–145)

## 2023-06-11 LAB — SERUM FREE LIGHT CHAINS
K/L FLC RATIO: 1.4 (ref 0.26–1.65)
KAPPA FREE,SERUM: 0.84 mg/dL (ref 0.33–1.94)
LAMBDA FREE, SER: 0.6 mg/dL (ref 0.57–2.63)

## 2023-06-11 LAB — LACTATE DEHYDROGENASE: LACTATE DEHYDROGENASE: 249 U/L — ABNORMAL HIGH (ref 120–246)

## 2023-06-11 LAB — URIC ACID: URIC ACID: 3.6 mg/dL — ABNORMAL LOW

## 2023-06-11 LAB — MAGNESIUM: MAGNESIUM: 1.9 mg/dL (ref 1.6–2.6)

## 2023-06-11 MED ADMIN — azacitidine (VIDAZA) syringe: 75 mg/m2 | SUBCUTANEOUS | @ 14:00:00 | Stop: 2023-06-11 | NDC 72606055801

## 2023-06-11 MED ADMIN — ondansetron (ZOFRAN) tablet 8 mg: 8 mg | ORAL | @ 14:00:00 | Stop: 2023-06-11 | NDC 71930001852

## 2023-06-11 NOTE — Unmapped (Signed)
[facility] Leukemia Clinic Visit Note     Patient Name: Timothy Porter  Patient Age: 77 y.o.  Encounter Date: 06/11/2023    Primary Care Provider:  Pcp, None Per Patient    Referring Physician:  Virgil Benedict, AGNP  [address]  [id number]  [place],  [place] [address]    Reason for visit:  AML     Assessment:  Timothy Porter is a 77 y.o. 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  He enrolled on BAML S12 arm (ven x 28 days) and began treatment on 11/22/2022. After 1 cycle of treatment he was found to be in CR without MRD.  He completed the study portion of 2 cycles and has been continued on [facility] aza/ven since  then. He has completed 6 cycles of treatment and presents to clinic today for follow up and consideration of cycle 7.     Timothy Porter is tolerating treatment on aza/ven quite well without complications or toxicities.  Today on lab review his plts are 243 and ANC 0.9 and are sufficient to proceed with cycle 7 today.  He will receive azacitidine 75mg /m2 x 5 days + venetoclax 400mg  x 21 days.  He should not require any additional lab monitoring between cycles and should continue valtrex for ppx.  [name] has been seen by [name] with our BMT program and is currently undergoing evaluation.  He will continue working with PT to increase his strength in effort to be able to proceed with transplant.  He will RTC in 4 weeks for follow up consideration of cycle 8.       Free Kappa Chain MM:  No evidence of progression at this time.  He will continue follow up with MM team/[name].           Diarrhea: Resolved with increasing cholestyramine.  Stools are now formed with decrease in urgency.                                              Plan:   - proceed with cycle 7 azacitidine 75mg /m2 x 5 days + venetoclax 400mg  x 21 days.   - valtrex only for ppx.   - continue work with PT  - RTC in 4 weeks for follow up and consideration of cycle 8.       [name] was available    [name], AGNP-BC  Leukemia Research Nurse Practitioner  Hematology/Oncology Division  [facility]  06/11/2023    I personally spent 45 minutes face-to-face and non-face-to-face in the care of this patient, which includes all pre, intra, and post visit time on the date of service.  All documented time was specific to the E/M visit and does not include any procedures that may have been performed.    Hematology/Oncology History Overview Note   Referring/Local Oncologist:  [name]     Diagnosis:   Diagnosis  Date Value Ref Range Status   10/26/2022   Final    Bone marrow, left iliac, aspiration and biopsy  -  Hypercellular bone marrow (50%) involved by acute myeloid leukemia with MECOM rearrangement (23% blasts by manual aspirate differential and 10-20% CD34-positive blasts by immunohistochemistry)   -  Mild to focally moderate marrow fibrosis (MF-1 to 2)   -  Less than 5% plasma cells by manual aspirate differential count (see Comment)  -  See linked reports for associated Ancillary Studies.      This electronic signature is attestation that the pathologist personally reviewed the submitted material(s) and the final diagnosis reflects that evaluation.          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,XY[7]         Myeloid Mutational Panel:        Variants of Known/Likely Clinical Significance:   Gene Coding Predicted Protein Variant allele fraction SRSF2 c.284C>G p.(Pro95Arg) 21.6 %       Treatment Timeline:  11/22/2022 Cycle 1:  BAML S12 azacitidine 75mg /m2 x 7 days + venetoclax 400mg  x 28 days  12/13/22: Bmbx - Limited, though no increase in blasts, MRD-neg by flow, poor specimen but no blasts (at <0.2%)   01/04/2023 Cycle 2: aza 75 x 7 + ven 400 x 28 days (2 week delay)  01/23/2023: BMbx: <5% blasts by CD34 immunohistochemistry, flow MRD negative  02/09/2023: Cycle 3: Aza 75 x 7 + ven 400 x 21  03/19/2023: Cycle 4 Aza 75 x 5 + ven 400 x 21 (Aza decreased due to delayed count recovery)  04/16/2023: Cycle 5: Aza 75 x 5 + ven 400 x 21  05/14/2023: Cycle 6: Aza 75 x 5 + ven 400 x 21     Multiple myeloma (CMS-HCC)   02/12/2019 Initial Diagnosis    Multiple myeloma (CMS-HCC)     05/28/2019 - 06/03/2019 Chemotherapy    BMT IP AUTO MELPHALAN  Melphalan 140 mg/m2 or 200 mg/m2 IV Day -1     10/22/2019 - 06/27/2022 Chemotherapy    STUDY [id number] IRB# [id number] LENALIDOMIDE (v. 11/04/18)  A Randomized Study of Daratumumab Plus Lenalidomide Versus Lenalidomide Alone as Maintenance Treatment in Patients with Newly Diagnosed Multiple Myeloma Who Are Minimal Residual Disease Positive After Frontline Autologous Stem Cell Transplant.     History of auto stem cell transplant (CMS-HCC) (Resolved)   08/05/2019 Initial Diagnosis    History of auto stem cell transplant (CMS-HCC)     10/22/2019 - 06/27/2022 Chemotherapy    STUDY [id number] IRB# [id number] LENALIDOMIDE (v. 11/04/18)  A Randomized Study of Daratumumab Plus Lenalidomide Versus Lenalidomide Alone as Maintenance Treatment in Patients with Newly Diagnosed Multiple Myeloma Who Are Minimal Residual Disease Positive After Frontline Autologous Stem Cell Transplant.     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 states he has been feeling well.  He has been working with PT to improve his leg strength in preparation for transplant.  He feels like he is stronger now than he was prior to his MM diagnoses years ago. Denies any n/v/d.  He has not experienced any dizzy spells or near syncope.  Nor has he had any more falls.  No new sob/cough.  No new fevers/chills.  No new complaints today.     Otherwise, he denies new constitutional symptoms such as anorexia, weight loss, fatigue, night sweats or unexplained fevers.  Furthermore, he denies unexplained bleeding or bruising, recurrent or unexplained intercurrent infections, dyspnea on exertion, lightheadedness, palpitations or chest pain.  There have been no new or unexplained pains or self-identified masses, swelling or enlarged lymph nodes.    ROS reviewed and negative except as noted in H and P     ECOG: 1    Allergies:  No Known Allergies    Medications:   Current Outpatient Medications:     calcium carbonate (TUMS) 200 mg calcium (500 mg) chewable tablet, Chew 2 tablets (400 mg of elem calcium total) Three (3) times a day., Disp: , Rfl:     cholecalciferol, vitamin D3 25 mcg, 1,000 units,, 1,000 unit (25 mcg) tablet, Take 1 tablet (25 mcg total) by mouth., Disp: , Rfl:     cholestyramine (QUESTRAN) 4 gram packet, Take 1 packet by mouth in the morning and 1 packet in the evening. Take with meals. Mix 4 g (1 packet) into 2-3 ounces of fluid and take twice a day.., Disp: 60 packet, Rfl: 5    cyanocobalamin, vitamin B-12, 2,000 mcg Tab, Take 2,000 mcg by mouth in the morning., Disp: 90 tablet, Rfl: 3    docusate sodium (COLACE) 50 MG capsule, Take 1 capsule (50 mg total) by mouth two (2) times a day., Disp: , Rfl:     loratadine (CLARITIN) 10 mg tablet, Take 1 tablet (10 mg total) by mouth daily., Disp: , Rfl:     metoPROLOL succinate (TOPROL-XL) 50 MG 24 hr tablet, Take 1 tablet (50 mg total) by mouth daily., Disp: 30 tablet, Rfl: 11    multivitamin with minerals tablet, Take 1 tablet by mouth daily., Disp: , Rfl: omeprazole (PRILOSEC) 40 MG capsule, TAKE 1 CAPSULE(40 MG) BY MOUTH DAILY, Disp: 90 capsule, Rfl: 0    ondansetron (ZOFRAN) 8 MG tablet, Take 1 tablet (8 mg total) by mouth every eight (8) hours as needed for nausea., Disp: 30 tablet, Rfl: 2    sildenafiL, pulm.hypertension, (REVATIO) 20 mg tablet, Take 1-5 tablets (20-100 mg total) by mouth daily as needed (for erectile function)., Disp: 90 tablet, Rfl: 3    tamsulosin (FLOMAX) 0.4 mg capsule, Take 1 capsule (0.4 mg total) by mouth daily., Disp: 90 capsule, Rfl: 0    valACYclovir (VALTREX) 500 MG tablet, Take 1 tablet (500 mg total) by mouth daily., Disp: 90 tablet, Rfl: 3    venetoclax (VENCLEXTA) 100 mg tablet, Take 4 tablets (400 mg total) by mouth daily for 21 days., Disp: 84 tablet, Rfl: 4  No current facility-administered medications for this visit.    Facility-Administered Medications Ordered in Other Visits:     dexAMETHasone (DECADRON) 4 mg/mL injection 20 mg, 20 mg, Intravenous, Once PRN, Doreatha Lew, MD    diphenhydrAMINE (BENADRYL) injection 25 mg, 25 mg, Intravenous, Once PRN, Doreatha Lew, MD    DTaP-hepatitis B recombinant-IPV (PEDIARIX) 10 mcg-25Lf-25 mcg-10Lf/0.5 mL injection, , , ,     DTaP-hepatitis B recombinant-IPV (PEDIARIX) 10 mcg-25Lf-25 mcg-10Lf/0.5 mL injection, , , ,     EPINEPHrine (EPIPEN) injection 0.3 mg, 0.3 mg, Intramuscular, Once PRN, Doreatha Lew, MD    famotidine (PF) (PEPCID) injection 20 mg, 20 mg, Intravenous, Once PRN, Doreatha Lew, MD    haemophilus B polysac-tetanus toxoid (ActHIB) 10 mcg/0.5 mL injection, , , ,  influenza vaccine quad (FLUARIX, FLULAVAL, FLUZONE) (6 MOS & UP) 2020-21 ADS Med, , , ,     methylPREDNISolone sodium succinate (PF) (SOLU-Medrol) injection 125 mg, 125 mg, Intravenous, Once PRN, Doreatha Lew, MD    OKAY TO SEND MEDICATION/CHEMOTHERAPY TO OUTPATIENT UNIT, , Other, Once, Doreatha Lew, MD    ondansetron [facility]) injection 8 mg, 8 mg, Intravenous, Once PRN, Doreatha Lew, MD    pneumococcal conjugate (13-valent) (PREVNAR-13) 0.5 mL vaccine, , , ,     pneumococcal conjugate (13-valent) (PREVNAR-13) 0.5 mL vaccine, , , ,     sodium chloride (NS) 0.9 % infusion, 20 mL/hr, Intravenous, Continuous PRN, Doreatha Lew, MD    sodium chloride 0.9% (NS) bolus 1,000 mL, 1,000 mL, Intravenous, Once PRN, Doreatha Lew, MD    varicella-zoster [id number] (PF) [facility]) 50 mcg/0.5 mL injection, , , ,     Medical History:  Past Medical History:   Diagnosis Date    Benign prostatic hyperplasia with lower urinary tract symptoms 11/27/2018    BPH (benign prostatic hyperplasia)     Diarrhea     Fatigue     Fractures     left arm    Glucose intolerance     May 2019: HbA1c 6.2.    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 CMV negative 04/07/20.     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  Planning to build more apartments.       Family History:  Family History   Problem Relation Age of Onset    Dementia Father     No Known Problems Other        Objective:   Vitals:    06/11/23 0825   BP: 169/98   Pulse: 77   Resp: 16   Temp: 36.4 ??C (97.6 ??F)   SpO2: 99%       Physical Exam:  General: Resting, in no apparent distress  HEENT:  PERRL. No scleral icterus or conjunctival injection.   Heart:  RRR.  S1, S2.  No murmurs, gallops or rubs. Mild non-pitting edema noted BLE.   Lungs:  Breathing is unlabored, and patient is speaking full sentences with ease.  CTAB. No rales, ronchi or crackles.    Abdomen:  No distention or pain on palpation.  Bowel sounds are present.  No palpable masses.  Skin:  No rashes, petechiae or purpura. Grossly intact.  Musculoskeletal:  strength is equal bilaterally   Psychiatric:  Range of affect is appropriate.    Neurologic:  Alert and oriented x 4.  Steady gait with assistance of cane.    Test Results:  Lab on 06/11/2023   Component Date Value Ref Range Status    Sodium 06/11/2023 143  135 - 145 mmol/L Final    Potassium 06/11/2023 4.9  3.5 - 5.1 mmol/L Final    Chloride 06/11/2023  107  98 - 107 mmol/L Final    CO2 06/11/2023 30.0  20.0 - 31.0 mmol/L Final    Anion Gap 06/11/2023 6  5 - 14 mmol/L Final    BUN 06/11/2023 19  9 - 23 mg/dL Final    Creatinine 16/08/9603 0.91  0.73 - 1.18 mg/dL Final    BUN/Creatinine Ratio 06/11/2023 21   Final    eGFR CKD-EPI (2021) Male 06/11/2023 88  >=60 mL/min/1.45m2 Final    eGFR calculated with CKD-EPI 2021 equation in accordance with [employer] and [employer] of Nephrology Task Force recommendations.    Glucose 06/11/2023 119  70 - 179 mg/dL Final    Calcium 54/07/8118 8.8  8.7 - 10.4 mg/dL Final    Albumin 14/78/2956 3.8  3.4 - 5.0 g/dL Final    Total Protein 06/11/2023 6.4  5.7 - 8.2 g/dL Final    Total Bilirubin 06/11/2023 0.8  0.3 - 1.2 mg/dL Final    AST 21/30/8657 35 (H)  <=34 U/L Final    ALT 06/11/2023 44  10 - 49 U/L Final    Alkaline Phosphatase 06/11/2023 85  46 - 116 U/L Final    ABO Grouping 06/11/2023 A POS   Final    Antibody Screen 06/11/2023 NEG   Final    LDH 06/11/2023 249 (H)  120 - 246 U/L Final    Uric Acid 06/11/2023 3.6 (L)  3.7 - 9.2 mg/dL Final    Magnesium 84/69/6295 1.9  1.6 - 2.6 mg/dL Final    WBC 28/41/3244 2.6 (L)  3.6 - 11.2 10*9/L Final    RBC 06/11/2023 3.72 (L)  4.26 - 5.60 10*12/L Final    HGB 06/11/2023 13.1  12.9 - 16.5 g/dL Final    HCT 11/29/7251 37.1 (L)  39.0 - 48.0 % Final    MCV 06/11/2023 99.8 (H)  77.6 - 95.7 fL Final    MCH 06/11/2023 35.2 (H)  25.9 - 32.4 pg Final    MCHC 06/11/2023 35.3  32.0 - 36.0 g/dL Final    RDW 66/44/0347 15.4 (H)  12.2 - 15.2 % Final    MPV 06/11/2023 7.2  6.8 - 10.7 fL Final    Platelet 06/11/2023 243  150 - 450 10*9/L Final    Neutrophils % 06/11/2023 35.4  % Final    Lymphocytes % 06/11/2023 49.6  % Final    Monocytes % 06/11/2023 14.1  % Final    Eosinophils % 06/11/2023 0.0  % Final    Basophils % 06/11/2023 0.9  % Final    Absolute Neutrophils 06/11/2023 0.9 (L)  1.8 - 7.8 10*9/L Final    Absolute Lymphocytes 06/11/2023 1.3  1.1 - 3.6 10*9/L Final    Absolute Monocytes 06/11/2023 0.4  0.3 - 0.8 10*9/L Final    Absolute Eosinophils 06/11/2023 0.0  0.0 - 0.5 10*9/L Final    Absolute Basophils 06/11/2023 0.0  0.0 - 0.1 10*9/L Final    Total IgG 06/11/2023 857  650 - 1,600 mg/dL Preliminary    IgM 42/59/5638    Preliminary    Pending.    IgA 06/11/2023 20.0 (L)  70.0 - 400.0 mg/dL Preliminary    Total Protein 06/11/2023 6.4  5.7 - 8.2 g/dL Preliminary    Kappa Free, Serum 06/11/2023 0.84  0.33 - 1.94 mg/dL Final    Lambda Free, Serum 06/11/2023 0.60  0.57 - 2.63 mg/dL Final    K/L FLC Ratio 06/11/2023 1.40  0.26 - 1.65 Final

## 2023-06-11 NOTE — Unmapped (Signed)
Patient educated on various topics, clinical references attached to patient's MyChart and AVS. Time spent 3 minutes.

## 2023-06-11 NOTE — Unmapped (Signed)
Lab on 06/11/2023   Component Date Value Ref Range Status    Sodium 06/11/2023 143  135 - 145 mmol/L Final    Potassium 06/11/2023 4.9  3.5 - 5.1 mmol/L Final    Chloride 06/11/2023 107  98 - 107 mmol/L Final    CO2 06/11/2023 30.0  20.0 - 31.0 mmol/L Final    Anion Gap 06/11/2023 6  5 - 14 mmol/L Final    BUN 06/11/2023 19  9 - 23 mg/dL Final    Creatinine 16/08/9603 0.91  0.73 - 1.18 mg/dL Final    BUN/Creatinine Ratio 06/11/2023 21   Final    eGFR CKD-EPI (2021) Male 06/11/2023 88  >=60 mL/min/1.34m2 Final    eGFR calculated with CKD-EPI 2021 equation in accordance with [employer] and [employer] of Nephrology Task Force recommendations.    Glucose 06/11/2023 119  70 - 179 mg/dL Final    Calcium 54/07/8118 8.8  8.7 - 10.4 mg/dL Final    Albumin 14/78/2956 3.8  3.4 - 5.0 g/dL Final    Total Protein 06/11/2023 6.4  5.7 - 8.2 g/dL Final    Total Bilirubin 06/11/2023 0.8  0.3 - 1.2 mg/dL Final    AST 21/30/8657 35 (H)  <=34 U/L Final    ALT 06/11/2023 44  10 - 49 U/L Final    Alkaline Phosphatase 06/11/2023 85  46 - 116 U/L Final    ABO Grouping 06/11/2023 A POS   Final    Antibody Screen 06/11/2023 NEG   Final    LDH 06/11/2023 249 (H)  120 - 246 U/L Final    Uric Acid 06/11/2023 3.6 (L)  3.7 - 9.2 mg/dL Final    Magnesium 84/69/6295 1.9  1.6 - 2.6 mg/dL Final    WBC 28/41/3244 2.6 (L)  3.6 - 11.2 10*9/L Final    RBC 06/11/2023 3.72 (L)  4.26 - 5.60 10*12/L Final    HGB 06/11/2023 13.1  12.9 - 16.5 g/dL Final    HCT 11/29/7251 37.1 (L)  39.0 - 48.0 % Final    MCV 06/11/2023 99.8 (H)  77.6 - 95.7 fL Final    MCH 06/11/2023 35.2 (H)  25.9 - 32.4 pg Final    MCHC 06/11/2023 35.3  32.0 - 36.0 g/dL Final    RDW 66/44/0347 15.4 (H)  12.2 - 15.2 % Final    MPV 06/11/2023 7.2  6.8 - 10.7 fL Final    Platelet 06/11/2023 243  150 - 450 10*9/L Final    Neutrophils % 06/11/2023 35.4  % Final    Lymphocytes % 06/11/2023 49.6  % Final    Monocytes % 06/11/2023 14.1  % Final    Eosinophils % 06/11/2023 0.0  % Final    Basophils % 06/11/2023 0.9  % Final    Absolute Neutrophils 06/11/2023 0.9 (L)  1.8 - 7.8 10*9/L Final    Absolute Lymphocytes 06/11/2023 1.3  1.1 - 3.6 10*9/L Final    Absolute Monocytes 06/11/2023 0.4  0.3 - 0.8 10*9/L Final    Absolute Eosinophils 06/11/2023 0.0  0.0 - 0.5 10*9/L Final    Absolute Basophils 06/11/2023 0.0  0.0 - 0.1 10*9/L Final    Kappa Free, Serum 06/11/2023 0.84  0.33 - 1.94 mg/dL Final    Lambda Free, Serum 06/11/2023 0.60  0.57 - 2.63 mg/dL Final    K/L FLC Ratio 06/11/2023 1.40  0.26 - 1.65 Final

## 2023-06-11 NOTE — Unmapped (Signed)
Pt in chair 5, here for chemo injection  Labs within range, no need for transfusion    Tolerated injection right abd   AVS declined   Ambulatory for dc home

## 2023-06-12 ENCOUNTER — Ambulatory Visit: Admit: 2023-06-12 | Discharge: 2023-06-13 | Payer: MEDICARE

## 2023-06-12 LAB — MONOCLONAL GAMMOPATHY WORKUP, SERUM
ALBUMIN (SPE): 4 g/dL (ref 3.5–5.0)
ALPHA-1 GLOBULIN: 0.2 g/dL (ref 0.2–0.5)
ALPHA-2 GLOBULIN: 0.7 g/dL (ref 0.5–1.1)
BETA-1 GLOBULIN: 0.4 g/dL (ref 0.3–0.6)
BETA-2 GLOBULIN: 0.3 g/dL (ref 0.2–0.6)
GAMMAGLOBULIN: 0.8 g/dL (ref 0.5–1.5)
PROTEIN TOTAL (SPECIAL CHEM): 6.4 g/dL

## 2023-06-12 LAB — MONOCLONAL GAMMOPATHY CHEMISTRIES
GAMMAGLOBULIN; IGA: 20 mg/dL — ABNORMAL LOW (ref 70.0–400.0)
GAMMAGLOBULIN; IGG: 857 mg/dL (ref 650–1600)
GAMMAGLOBULIN; IGM: 8 mg/dL — ABNORMAL LOW (ref 40–230)
PROTEIN TOTAL (SPECIAL CHEM): 6.4 g/dL (ref 5.7–8.2)

## 2023-06-12 MED ADMIN — ondansetron (ZOFRAN) tablet 8 mg: 8 mg | ORAL | @ 21:00:00 | Stop: 2023-06-12 | NDC 71930001852

## 2023-06-12 MED ADMIN — azacitidine (VIDAZA) syringe: 75 mg/m2 | SUBCUTANEOUS | @ 21:00:00 | Stop: 2023-06-12 | NDC 72606055801

## 2023-06-12 NOTE — Unmapped (Signed)
NEUROLOGICAL OUTPATIENT PHYSICAL THERAPY   DAILY NOTE    Patient Name: Timothy Porter  Date of Birth:13-Jun-1946  Date: 06/12/2023  Session Number:  4 (3/10 to reassessment)  Therapy Diagnosis:   Encounter Diagnoses   Name Primary?    Multiple myeloma, remission status unspecified (CMS-HCC) Yes    Impaired functional mobility, balance, and endurance     Physical deconditioning     Muscle weakness     Chronic left hip pain              Date of Injury/Onset: Aug 2020  Date of Evaluation: 06/05/2023  Referring Pracitioner: Lindwood Qua*  Certification Dates: 06/06/23-08/28/23      ASSESSMENT:    77 y.o. 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 Patient presents to this clinic to improve balance, strength, and endurance in preparation for a stem cell transplant.     Today's session focused on sit to stand transfer training as well as endurance training. The patient responded well to cueing and modeling to improve sit to stand technique. By the end of the session, he was able to perform the transfer from a standard-height chair with good form. He will benefit from reinforcement.    The patient will continue to benefit from skilled Physical Therapy services for balance training, bilateral LE strengthening, endurance, gait training, postural re-training, and education.    Prior Objective Testing  Tests Eval (06/05/23) 06/06/23   5xSTS 20.3 sec, UE support -   - 997 ft  With SPC   TUG and Cognitive TUG - Without AD:  TUG: 14.5 sec  Cognitive TUG:   15.4 sec         Plan for next session:  sit to stand technique carry-over, eyes closed/open ambulation, stair navigation (descending is primary concern),     Problem List: decreased bilateral LE strength, impaired balance, impaired posture, deconditioning, and impaired functional mobility    Personal Factors/Comorbidities Present: Bilateral THA , HTN, multiple myeloma, R femoral lytic lesion, acute myeloid leukemia (AML)      Goals    Patient/Family Goals:  floor transfers, balance, preparing for the stem cell transplant    Short Term Goals:  In  12  visits:  Patient will be able to properly demonstrate current HEP independently x1 in clinic to build upon functional gains in PT.   Patient will stand on foam with feet together and eyes closed for 30 sec with minimal sway in open gym space to demonstrate increased confidence in balance.  Patient will complete the Timed Up and Go test without an assistive device in < 13.5 seconds to minimize the risk of falls.  Patient will navigate 8 stairs with unilateral handrail, mod I.   Patient will navigate 6 outdoor curb with assistive device.  Patient will perform floor transfer with external support with SBA.   Patient will stoop to pick up 10 objects off the ground with assistive device.     Long Term Goals:  In 24  visits:  Patient will be able to properly demonstrate updated HEP independently x1 in clinic to build upon functional gains in PT.   Patient will complete 5xSTS without UE support in <15 seconds to demonstrate improved functional LE strength.  Patient will complete the Cognitive Timed Up and Go test without an assistive device in < 17.5 seconds to minimize the risk of falls with increased cognitive  demand.   Patient will navigate 16 stairs with unilateral handrail, mod I.   Patient will increase bilateral hip flexor strengthening to 4+/5.  Patient will perform floor transfer without external support with supervision.  Patient will complete the in >1000 ft with SPC.    PLAN/Recommendations: 3 x a week for 8 weeks  Planned Interventions: Manual Therapy  Gait Training  Therapeutic Activites  Neuromuscular Education  Self-Care  Physical Performance Test  Therapeutic exercise  Balance training  Postural exercises/education  Education  Dry Needling as needed  PT DME/Equipment Recs: none    Home Exercise Program:  -Feet together, eyes open,  3 x hold 30 sec  -standing against wall with scapular retraction for posture and standing endurance, 3 x 1 min  -Mini squats with green band around knees, 3 x 10  - Bridge 3 x10  - side-lying hip abduction 3 x10  - banded seated marching 3 x10    SUBJECTIVE:  Pt states:   -He had chemo yesterday. He is a little tired today but was still able to take his morning walk.    Reason for Referral/History of Present Condition/Onset of injury/exacerbation:   77 y.o. 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 with impaired endurance, strength, and balance in preparation for stem cell transplant. Referral for conditioning, mobility, BLE strengthening/stretching, balance, core strengthening. Create home exercise program. Patient diagnosed with multiple myeloma in August 2020. He was found to have progressive pancytopenia in Sept 2023 and BMbx was done. 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 He enrolled on BAML S12 arm (ven x 28 days) and began treatment on 11/22/2022. After 1 cycle of treatment he was found to be in complete remission without minimal residual disease. 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 Plans to defer any further multiple myeloma therapy until relapse. He is being considered for a [facility]IC allo stem cell transplant.    Last Fall: June 2024 around pool, needed external support but was able to stand  Precautions: Cancer, Chemotherapy  Red Flags:  none    Prior Functional Status: Working and on feet all the time. Fatigued and then they found the leukemia.    Current Functional Status: Walk 20-minutes per day with cane, half hour watering plants, Tuesdays does laundry. Sometimes uses [employer] [facility] for stabilizing. UE and LE resistance bands daily. 7,000 steps/day and 2-3 errands per day.    Currently used Equipment: SPC (only when leaving the house). Has heated pool, treadmill, eliptical  Previous Treatment: PT in 2022-2023 for deconditioning due to multiple myeloma, PT in 2023 for L THA    Social History: Lives with spouse in [place] house with 0 STE, small threshold. 5 steps going into pool unilateral.  Caregiver availability, capability, willingness: Wife retired at home  Independent w/ ADLs?: Yes.   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   -Wife Lurena Joiner) is a retired     OBJECTIVE :  Pain  Current:  0/10 knee pain    Vitals: Pre-session:  BP: 118/62 mmHg  HR: 91 bpm    Therapeutic Activity: 15 min  Sit to stand transfer training, with focus on forward trunk flexion when sitting and standing  - Started on elevated mat table, and progressively lowered mat table to lowest height. Then repeated with pt sitting in standard-height chair.   - Pt able to perform  with initial SBA, progressing to mod indep, even from lowest height    There Ex x 25 min:    NuStep, L5, profile 2, bilat UE and LE, goal SPM >60 for strength/endurance training   - 3-4/10 RPE    Forward and backward walking 6 x20' each direction with intermittent UE support from counter    Verbal HEP review.      Patient Education: Throughout evaluation patient educated regarding the following: Role of PT in Rehabilitation, HEP, and treatment plan. Patient demonstrated and verbalized agreement and understanding.        Total Time: 40 min   Treatment Rendered:    See above    I attest that I have reviewed the above information.  Signed: Eulas Post, PT  06/12/2023 10:30 AM

## 2023-06-13 ENCOUNTER — Ambulatory Visit: Admit: 2023-06-13 | Discharge: 2023-06-14 | Payer: MEDICARE

## 2023-06-13 MED ADMIN — azacitidine (VIDAZA) syringe: 75 mg/m2 | SUBCUTANEOUS | @ 21:00:00 | Stop: 2023-06-13 | NDC 72606055801

## 2023-06-13 MED ADMIN — ondansetron (ZOFRAN) tablet 8 mg: 8 mg | ORAL | @ 21:00:00 | Stop: 2023-06-13 | NDC 71930001852

## 2023-06-13 NOTE — Unmapped (Signed)
No visits with results within 1 Day(s) from this visit.   Latest known visit with results is:   Lab on 06/11/2023   Component Date Value Ref Range Status    Sodium 06/11/2023 143  135 - 145 mmol/L Final    Potassium 06/11/2023 4.9  3.5 - 5.1 mmol/L Final    Chloride 06/11/2023 107  98 - 107 mmol/L Final    CO2 06/11/2023 30.0  20.0 - 31.0 mmol/L Final    Anion Gap 06/11/2023 6  5 - 14 mmol/L Final    BUN 06/11/2023 19  9 - 23 mg/dL Final    Creatinine 16/08/9603 0.91  0.73 - 1.18 mg/dL Final    BUN/Creatinine Ratio 06/11/2023 21   Final    eGFR CKD-EPI (2021) Male 06/11/2023 88  >=60 mL/min/1.15m2 Final    eGFR calculated with CKD-EPI 2021 equation in accordance with [employer] and [employer] of Nephrology Task Force recommendations.    Glucose 06/11/2023 119  70 - 179 mg/dL Final    Calcium 54/07/8118 8.8  8.7 - 10.4 mg/dL Final    Albumin 14/78/2956 3.8  3.4 - 5.0 g/dL Final    Total Protein 06/11/2023 6.4  5.7 - 8.2 g/dL Final    Total Bilirubin 06/11/2023 0.8  0.3 - 1.2 mg/dL Final    AST 21/30/8657 35 (H)  <=34 U/L Final    ALT 06/11/2023 44  10 - 49 U/L Final    Alkaline Phosphatase 06/11/2023 85  46 - 116 U/L Final    ABO Grouping 06/11/2023 A POS   Final    Antibody Screen 06/11/2023 NEG   Final    LDH 06/11/2023 249 (H)  120 - 246 U/L Final    Uric Acid 06/11/2023 3.6 (L)  3.7 - 9.2 mg/dL Final    Magnesium 84/69/6295 1.9  1.6 - 2.6 mg/dL Final    WBC 28/41/3244 2.6 (L)  3.6 - 11.2 10*9/L Final    RBC 06/11/2023 3.72 (L)  4.26 - 5.60 10*12/L Final    HGB 06/11/2023 13.1  12.9 - 16.5 g/dL Final    HCT 11/29/7251 37.1 (L)  39.0 - 48.0 % Final    MCV 06/11/2023 99.8 (H)  77.6 - 95.7 fL Final    MCH 06/11/2023 35.2 (H)  25.9 - 32.4 pg Final    MCHC 06/11/2023 35.3  32.0 - 36.0 g/dL Final    RDW 66/44/0347 15.4 (H)  12.2 - 15.2 % Final    MPV 06/11/2023 7.2  6.8 - 10.7 fL Final    Platelet 06/11/2023 243  150 - 450 10*9/L Final    Neutrophils % 06/11/2023 35.4  % Final    Lymphocytes % 06/11/2023 49.6  % Final    Monocytes % 06/11/2023 14.1  % Final    Eosinophils % 06/11/2023 0.0  % Final    Basophils % 06/11/2023 0.9  % Final    Absolute Neutrophils 06/11/2023 0.9 (L)  1.8 - 7.8 10*9/L Final    Absolute Lymphocytes 06/11/2023 1.3  1.1 - 3.6 10*9/L Final    Absolute Monocytes 06/11/2023 0.4  0.3 - 0.8 10*9/L Final    Absolute Eosinophils 06/11/2023 0.0  0.0 - 0.5 10*9/L Final    Absolute Basophils 06/11/2023 0.0  0.0 - 0.1 10*9/L Final    T Albumin 06/11/2023 4.0  3.5 - 5.0 g/dL Final    Alpha-1 Globulin 06/11/2023 0.2  0.2 - 0.5 g/dL Final    Alpha-2 Globulin 06/11/2023 0.7  0.5 - 1.1 g/dL  Final    Beta-1 Globulin 06/11/2023 0.4  0.3 - 0.6 g/dL Final    Beta-2 Globulin 06/11/2023 0.3  0.2 - 0.6 g/dL Final    Gammaglobulin 06/11/2023 0.8  0.5 - 1.5 g/dL Final    SPE Interpretation 06/11/2023    Final    The SPE pattern demonstrates a slight irregularity in the gamma region, which may represent monoclonal protein. See Immunofixation report.    Immunofixation Electrophoresis, Se* 06/11/2023    Final    No monoclonal protein detected.    Total Protein 06/11/2023 6.4  g/dL Final    Total IgG 09/81/1914 857  650 - 1,600 mg/dL Final    IgM 78/29/5621 8 (L)  40 - 230 mg/dL Final    IgA 30/86/5784 20.0 (L)  70.0 - 400.0 mg/dL Final    Total Protein 06/11/2023 6.4  5.7 - 8.2 g/dL Final    Kappa Free, Serum 06/11/2023 0.84  0.33 - 1.94 mg/dL Final    Lambda Free, Serum 06/11/2023 0.60  0.57 - 2.63 mg/dL Final    K/L FLC Ratio 06/11/2023 1.40  0.26 - 1.65 Final

## 2023-06-13 NOTE — Unmapped (Signed)
Pt in chair 14 came in for his scheduled chemo injections which were tolerated well.    Pt was premedicated; labs were reviewed.    AVS was declined, went home ambulatory.

## 2023-06-14 ENCOUNTER — Ambulatory Visit: Admit: 2023-06-14 | Discharge: 2023-06-15 | Payer: MEDICARE

## 2023-06-14 LAB — COMPREHENSIVE METABOLIC PANEL
ALBUMIN: 3.8 g/dL (ref 3.4–5.0)
ALKALINE PHOSPHATASE: 80 U/L (ref 46–116)
ALT (SGPT): 34 U/L (ref 10–49)
ANION GAP: 7 mmol/L (ref 5–14)
AST (SGOT): 29 U/L (ref <=34)
BILIRUBIN TOTAL: 0.9 mg/dL (ref 0.3–1.2)
BLOOD UREA NITROGEN: 22 mg/dL (ref 9–23)
BUN / CREAT RATIO: 22
CALCIUM: 9.3 mg/dL (ref 8.7–10.4)
CHLORIDE: 105 mmol/L (ref 98–107)
CO2: 26 mmol/L (ref 20.0–31.0)
CREATININE: 0.98 mg/dL
EGFR CKD-EPI (2021) MALE: 80 mL/min/{1.73_m2} (ref >=60)
GLUCOSE RANDOM: 127 mg/dL (ref 70–179)
POTASSIUM: 4.3 mmol/L (ref 3.4–4.8)
PROTEIN TOTAL: 6.6 g/dL (ref 5.7–8.2)
SODIUM: 138 mmol/L (ref 135–145)

## 2023-06-14 LAB — CBC W/ AUTO DIFF
BASOPHILS ABSOLUTE COUNT: 0 10*9/L (ref 0.0–0.1)
BASOPHILS RELATIVE PERCENT: 0.3 %
EOSINOPHILS ABSOLUTE COUNT: 0 10*9/L (ref 0.0–0.5)
EOSINOPHILS RELATIVE PERCENT: 0.1 %
HEMATOCRIT: 36.2 % — ABNORMAL LOW (ref 39.0–48.0)
HEMOGLOBIN: 12.2 g/dL — ABNORMAL LOW (ref 12.9–16.5)
LYMPHOCYTES ABSOLUTE COUNT: 1.1 10*9/L (ref 1.1–3.6)
LYMPHOCYTES RELATIVE PERCENT: 33.6 %
MEAN CORPUSCULAR HEMOGLOBIN CONC: 33.8 g/dL (ref 32.0–36.0)
MEAN CORPUSCULAR HEMOGLOBIN: 34 pg — ABNORMAL HIGH (ref 25.9–32.4)
MEAN CORPUSCULAR VOLUME: 100.6 fL — ABNORMAL HIGH (ref 77.6–95.7)
MEAN PLATELET VOLUME: 7 fL (ref 6.8–10.7)
MONOCYTES ABSOLUTE COUNT: 0.6 10*9/L (ref 0.3–0.8)
MONOCYTES RELATIVE PERCENT: 20.4 %
NEUTROPHILS ABSOLUTE COUNT: 1.4 10*9/L — ABNORMAL LOW (ref 1.8–7.8)
NEUTROPHILS RELATIVE PERCENT: 45.6 %
PLATELET COUNT: 171 10*9/L (ref 150–450)
RED BLOOD CELL COUNT: 3.6 10*12/L — ABNORMAL LOW (ref 4.26–5.60)
RED CELL DISTRIBUTION WIDTH: 15.5 % — ABNORMAL HIGH (ref 12.2–15.2)
WBC ADJUSTED: 3.2 10*9/L — ABNORMAL LOW (ref 3.6–11.2)

## 2023-06-14 MED ADMIN — azacitidine (VIDAZA) syringe: 75 mg/m2 | SUBCUTANEOUS | @ 12:00:00 | Stop: 2023-06-14 | NDC 72606055801

## 2023-06-14 MED ADMIN — ondansetron (ZOFRAN) tablet 8 mg: 8 mg | ORAL | @ 12:00:00 | Stop: 2023-06-14 | NDC 71930001852

## 2023-06-14 NOTE — Unmapped (Signed)
Patient presented for an azacitidine injection and lab check. Patient denied any pain, shortness of breath or nausea during treatment; no s/s of reaction noted. Patient declined waiting for lab results, stated he felt great and had not needed any blood products in months. Patient tolerated treatment well and was stable at time of discharge; self-ambulated to lobby.

## 2023-06-14 NOTE — Unmapped (Signed)
RED ZONE Means: RED ZONE: Take action now!     You need to be seen right away  Symptoms are at a severe level of discomfort    Call 911 or go to your nearest  Hospital for help     - Bleeding that will not stop    - Hard to breathe    - New seizure - Chest pain  - Fall or passing out  -Thoughts of hurting    yourself or others      Call 911 if you are going into the RED ZONE                  YELLOW ZONE Means:     Please call with any new or worsening symptom(s), even if not on this list.  Call [phone]  After hours, weekends, and holidays - you will reach a long recording with specific instructions, If not in an emergency such as above, please listen closely all the way to the end and choose the option that relates to your need.   You can be seen by a provider the same day through our Same Day Acute Care for Patients with Cancer program.      YELLOW ZONE: Take action today     Symptoms are new or worsening  You are not within your goal range for:    - Pain    - Shortness of breath    - Bleeding (nose, urine, stool, wound)    - Feeling sick to your stomach and throwing up    - Mouth sores/pain in your mouth or throat    - Hard stool or very loose stools (increase in       ostomy output)    - No urine for 12 hours    - Feeding tube or other catheter/tube issue    - Redness or pain at previous IV or port/catheter site    - Depressed or anxiety   - Swelling (leg, arm, abdomen,     face, neck)  - Skin rash or skin changes  - Wound issues (redness, drainage,    re-opened)  - Confusion  - Vision changes  - Fever >100.4 F or chills  - Worsening cough with mucus that is    green, yellow, or bloody  - Pain or burning when going to the    bathroom  - Home Infusion Pump Issue- call    [phone]         Call your healthcare provider if you are going into the YELLOW ZONE     GREEN ZONE Means:  Your symptoms are under controls  Continue to take your medicine as ordered  Keep all visits to the provider GREEN ZONE: You are in control  No increase or worsening symptoms  Able to take your medicine  Able to drink and eat    - DO NOT use MyChart messages to report red or yellow symptoms. Allow up to 3    business days for a reply.  -MyChart is for non-urgent medication refills, scheduling requests, or other general questions.         [id number] Rev. 05/26/2022  Approved by Oncology Patient Education Committee

## 2023-06-14 NOTE — Unmapped (Signed)
Timothy Porter received his azacitidine injection without immediate reaction. Previous injection sites are red, but patient denies discomfort and skin is in tact. He left alert and ambulatory.

## 2023-06-14 NOTE — Unmapped (Signed)
RED ZONE Means: RED ZONE: Take action now!     You need to be seen right away  Symptoms are at a severe level of discomfort    Call 911 or go to your nearest  Hospital for help     - Bleeding that will not stop    - Hard to breathe    - New seizure - Chest pain  - Fall or passing out  -Thoughts of hurting    yourself or others      Call 911 if you are going into the RED ZONE                  YELLOW ZONE Means:     Please call with any new or worsening symptom(s), even if not on this list.  Call [phone]  After hours, weekends, and holidays - you will reach a long recording with specific instructions, If not in an emergency such as above, please listen closely all the way to the end and choose the option that relates to your need.   You can be seen by a provider the same day through our Same Day Acute Care for Patients with Cancer program.      YELLOW ZONE: Take action today     Symptoms are new or worsening  You are not within your goal range for:    - Pain    - Shortness of breath    - Bleeding (nose, urine, stool, wound)    - Feeling sick to your stomach and throwing up    - Mouth sores/pain in your mouth or throat    - Hard stool or very loose stools (increase in       ostomy output)    - No urine for 12 hours    - Feeding tube or other catheter/tube issue    - Redness or pain at previous IV or port/catheter site    - Depressed or anxiety   - Swelling (leg, arm, abdomen,     face, neck)  - Skin rash or skin changes  - Wound issues (redness, drainage,    re-opened)  - Confusion  - Vision changes  - Fever >100.4 F or chills  - Worsening cough with mucus that is    green, yellow, or bloody  - Pain or burning when going to the    bathroom  - Home Infusion Pump Issue- call    [phone]         Call your healthcare provider if you are going into the YELLOW ZONE     GREEN ZONE Means:  Your symptoms are under controls  Continue to take your medicine as ordered  Keep all visits to the provider GREEN ZONE: You are in control  No increase or worsening symptoms  Able to take your medicine  Able to drink and eat    - DO NOT use MyChart messages to report red or yellow symptoms. Allow up to 3    business days for a reply.  -MyChart is for non-urgent medication refills, scheduling requests, or other general questions.         [id number] Rev. 05/26/2022  Approved by Oncology Patient Education Committee        Hospital Outpatient Visit on 06/14/2023   Component Date Value Ref Range Status    WBC 06/14/2023 3.2 (L)  3.6 - 11.2 10*9/L Final    RBC 06/14/2023 3.60 (L)  4.26 - 5.60 10*12/L Final    HGB 06/14/2023  12.2 (L)  12.9 - 16.5 g/dL Final    HCT 47/82/9562 36.2 (L)  39.0 - 48.0 % Final    MCV 06/14/2023 100.6 (H)  77.6 - 95.7 fL Final    MCH 06/14/2023 34.0 (H)  25.9 - 32.4 pg Final    MCHC 06/14/2023 33.8  32.0 - 36.0 g/dL Final    RDW 13/06/6577 15.5 (H)  12.2 - 15.2 % Final    MPV 06/14/2023 7.0  6.8 - 10.7 fL Final    Platelet 06/14/2023 171  150 - 450 10*9/L Final    Neutrophils % 06/14/2023 45.6  % Final    Lymphocytes % 06/14/2023 33.6  % Final    Monocytes % 06/14/2023 20.4  % Final    Eosinophils % 06/14/2023 0.1  % Final    Basophils % 06/14/2023 0.3  % Final    Absolute Neutrophils 06/14/2023 1.4 (L)  1.8 - 7.8 10*9/L Final    Absolute Lymphocytes 06/14/2023 1.1  1.1 - 3.6 10*9/L Final    Absolute Monocytes 06/14/2023 0.6  0.3 - 0.8 10*9/L Final    Absolute Eosinophils 06/14/2023 0.0  0.0 - 0.5 10*9/L Final    Absolute Basophils 06/14/2023 0.0  0.0 - 0.1 10*9/L Final    Macrocytosis 06/14/2023 Slight (A)  Not Present Final

## 2023-06-15 ENCOUNTER — Ambulatory Visit: Admit: 2023-06-15 | Discharge: 2023-06-16 | Payer: MEDICARE

## 2023-06-15 MED ADMIN — ondansetron (ZOFRAN) tablet 8 mg: 8 mg | ORAL | @ 20:00:00 | Stop: 2023-06-15 | NDC 71930001852

## 2023-06-15 MED ADMIN — azacitidine (VIDAZA) syringe: 75 mg/m2 | SUBCUTANEOUS | @ 20:00:00 | Stop: 2023-06-15 | NDC 72606055801

## 2023-06-15 NOTE — Unmapped (Signed)
1550 PT arrived for scheduled injection. Pt tolerated injection and discharged home from clinic.

## 2023-06-18 DIAGNOSIS — C9000 Multiple myeloma not having achieved remission: Principal | ICD-10-CM

## 2023-06-18 DIAGNOSIS — K219 Gastro-esophageal reflux disease without esophagitis: Principal | ICD-10-CM

## 2023-06-18 MED ORDER — OMEPRAZOLE 40 MG CAPSULE,DELAYED RELEASE
ORAL_CAPSULE | ORAL | 0 refills | 0 days
Start: 2023-06-18 — End: ?

## 2023-06-18 NOTE — Unmapped (Signed)
[facility] Refill Coordination Note    Timothy Porter, DOB: 02/10/1946  Phone: [phone] (home)       All above HIPAA information was verified with patient.         06/18/2023     9:50 AM   Specialty Rx Medication Refill Questionnaire   Which Medications would you like refilled and shipped? Venclexta , 8 fays   Please list all current allergies: None   Have you missed any doses in the last 30 days? No   Have you had any changes to your medication(s) since your last refill? No   How many days remaining of each medication do you have at home? 8 days   If receiving an injectable medication, next injection date is 07/08/2026   Have you experienced any side effects in the last 30 days? No   Please enter the full address (street address, city, state, zip code) where you would like your medication(s) to be delivered to. [address], [place] [address]   Please specify on which day you would like your medication(s) to arrive. Note: if you need your medication(s) within 3 days, please call the pharmacy to schedule your order at [phone]  06/24/2023   Has your insurance changed since your last refill? No   Would you like a pharmacist to call you to discuss your medication(s)? No   Do you require a signature for your package? (Note: if we are billing Medicare Part B or your order contains a controlled substance, we will require a signature) No     Spoke to  pt (via phone ) and  he  is  okay  with  delivery  of   06/22/23     Completed refill call assessment today to schedule patient's medication shipment from the [facility] [phone]).  All relevant notes have been reviewed.       Confirmed patient received a Conservation officer, historic buildings and a Surveyor, mining with first shipment. The patient will receive a drug information handout for each medication shipped and additional FDA Medication Guides as required.         REFERRAL TO PHARMACIST     Referral to the pharmacist: Not needed      [facility]     Shipping address confirmed in Epic.     Delivery Scheduled: Yes, Expected medication delivery date: 06/22/23 .     Medication will be delivered via Same Day Courier to the prescription address in Epic WAM.    Ricci Barker   [facility] Specialty Technician

## 2023-06-18 NOTE — Unmapped (Signed)
Pt is requesting refill    Most recent clinic visit: 06/11/2023  Next clinic visit:  07/09/2023

## 2023-06-19 MED ORDER — OMEPRAZOLE 40 MG CAPSULE,DELAYED RELEASE
ORAL_CAPSULE | ORAL | 0 refills | 0 days | Status: CP
Start: 2023-06-19 — End: ?

## 2023-06-19 NOTE — Unmapped (Signed)
NEUROLOGICAL OUTPATIENT PHYSICAL THERAPY   DAILY NOTE    Patient Name: Timothy Porter  Date of Birth:1946-05-16  Date: 06/19/2023  Session Number:  5 (4/10 to reassessment)  Therapy Diagnosis:   Encounter Diagnoses   Name Primary?    Multiple myeloma, remission status unspecified (CMS-HCC) Yes    Impaired functional mobility, balance, and endurance     Physical deconditioning     Muscle weakness          Date of Injury/Onset: Aug 2020  Date of Evaluation: 06/05/2023  Referring Pracitioner: Lindwood Qua*  Certification Dates: 06/06/23-08/28/23      ASSESSMENT:    77 y.o. 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 Patient presents to this clinic to improve balance, strength, and endurance in preparation for a stem cell transplant.     Today's session focused on strength and endurance training. The patient tolerated exercises well, especially considering that he arrived reporting increased fatigue following chemotherapy. He continues to benefit from cues to improve LE alignment with NuStep and sit to stand exercises.    The patient will continue to benefit from skilled Physical Therapy services for balance training, bilateral LE strengthening, endurance, gait training, postural re-training, and education.    Prior Objective Testing  Tests Eval (06/05/23) 06/06/23   5xSTS 20.3 sec, UE support -   - 997 ft  With SPC   TUG and Cognitive TUG - Without AD:  TUG: 14.5 sec  Cognitive TUG:   15.4 sec         Plan for next session: eyes closed/open ambulation, stair navigation (descending is primary concern),     Problem List: decreased bilateral LE strength, impaired balance, impaired posture, deconditioning, and impaired functional mobility    Personal Factors/Comorbidities Present: Bilateral THA , HTN, multiple myeloma, R femoral lytic lesion, acute myeloid leukemia (AML)      Goals    Patient/Family Goals:  floor transfers, balance, preparing for the stem cell transplant    Short Term Goals:  In  12  visits:  Patient will be able to properly demonstrate current HEP independently x1 in clinic to build upon functional gains in PT.   Patient will stand on foam with feet together and eyes closed for 30 sec with minimal sway in open gym space to demonstrate increased confidence in balance.  Patient will complete the Timed Up and Go test without an assistive device in < 13.5 seconds to minimize the risk of falls.  Patient will navigate 8 stairs with unilateral handrail, mod I.   Patient will navigate 6 outdoor curb with assistive device.  Patient will perform floor transfer with external support with SBA.   Patient will stoop to pick up 10 objects off the ground with assistive device.     Long Term Goals:  In 24  visits:  Patient will be able to properly demonstrate updated HEP independently x1 in clinic to build upon functional gains in PT.   Patient will complete 5xSTS without UE support in <15 seconds to demonstrate improved functional LE strength.  Patient will complete the Cognitive Timed Up and Go test without an assistive device in < 17.5 seconds to minimize the risk of falls with increased cognitive demand.   Patient will navigate 16 stairs with unilateral handrail, mod I.   Patient will increase bilateral hip flexor strengthening to 4+/5.  Patient will perform floor transfer without  external support with supervision.  Patient will complete the in >1000 ft with SPC.    PLAN/Recommendations: 3 x a week for 8 weeks  Planned Interventions: Manual Therapy  Gait Training  Therapeutic Activites  Neuromuscular Education  Self-Care  Physical Performance Test  Therapeutic exercise  Balance training  Postural exercises/education  Education  Dry Needling as needed  PT DME/Equipment Recs: none    Home Exercise Program:  -Feet together, eyes open,  3 x hold 30 sec  -standing against wall with scapular retraction for posture and standing endurance, 3 x 1 min  -Mini squats with green band around knees, 3 x 10  - Bridge 3 x10  - side-lying hip abduction 3 x10  - banded seated marching 3 x10    SUBJECTIVE:  Pt states:   -He took a much longer walk in Sunday, and he did much more in the pool. His legs are tired.  -He is in the 7th day of the chemo shots, which is when he feels the weakest.   -He already did some exercise this morning.   -Sit to stands are going well.     Reason for Referral/History of Present Condition/Onset of injury/exacerbation:   77 y.o. 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 Referral for conditioning, mobility, BLE strengthening/stretching, balance, core strengthening. Create home exercise program. Patient diagnosed with multiple myeloma in August 2020. He was found to have progressive pancytopenia in Sept 2023 and BMbx was done. 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 He enrolled on BAML S12 arm (ven x 28 days) and began treatment on 11/22/2022. After 1 cycle of treatment he was found to be in complete remission without minimal residual disease. 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 Plans to defer any further multiple myeloma therapy until relapse. He is being considered for a RIC allo stem cell transplant.    Last Fall: June 2024 around pool, needed external support but was able to stand  Precautions: Cancer, Chemotherapy  Red Flags:  none    Prior Functional Status: Working and on feet all the time. Fatigued and then they found the leukemia.    Current Functional Status: Walk 20-minutes per day with cane, half hour watering plants, Tuesdays does laundry. Sometimes uses [employer] for stabilizing. UE and LE resistance bands daily. 7,000 steps/day and 2-3 errands per day.    Currently used Equipment: SPC (only when leaving the house). Has heated pool, treadmill, eliptical  Previous Treatment: PT in 2022-2023 for deconditioning due to multiple myeloma, PT in 2023 for L THA    Social History: Lives with spouse in [place] house with 0 STE, small threshold. 5 steps going into pool unilateral.  Caregiver availability, capability, willingness: Wife retired at home  Independent w/ ADLs?: Yes.   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   -Wife Lurena Joiner) is a retired     OBJECTIVE :  Pain  Current:  0/10 knee pain    Vitals: Pre-session:  BP: 138/79 mmHg  HR: 98 bpm    There Ex x 40 min:    NuStep, L5, bilat UE and LE, goal SPM >60 for strength/endurance training, 15 min    Sit to stand for LE strengthening, with focus on forward trunk flexion when sitting and standing x5  Seated  clamshell with x-heavy band around thighs 3 x20, VC to slow speed  Seated march with x-heavy band around thighs 3 x20  Sit to stand with x-heavy band around thighs, focus on maintaining hip ER to improve knee alignment x3 with SBA      Patient Education: Throughout evaluation patient educated regarding the following: Role of PT in Rehabilitation, HEP, and treatment plan. Patient demonstrated and verbalized agreement and understanding.        Total Time: 40 min   Treatment Rendered:    See above    I attest that I have reviewed the above information.  Signed: Eulas Post, PT  06/19/2023 10:30 AM

## 2023-06-21 NOTE — Unmapped (Signed)
NEUROLOGICAL OUTPATIENT PHYSICAL THERAPY   DAILY NOTE    Patient Name: Timothy Porter  Date of Birth:1946-04-30  Date: 06/21/2023  Session Number:  6 (5/10 to reassessment)  Therapy Diagnosis:   Encounter Diagnoses   Name Primary?    Multiple myeloma, remission status unspecified (CMS-HCC) Yes    Impaired functional mobility, balance, and endurance     Physical deconditioning     Muscle weakness          Date of Injury/Onset: Aug 2020  Date of Evaluation: 06/05/2023  Referring Pracitioner: Lindwood Qua*  Certification Dates: 06/06/23-08/28/23      ASSESSMENT:    77 y.o. 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 Patient presents to this clinic to improve balance, strength, and endurance in preparation for a stem cell transplant.     Today's session focused on strength and endurance training. The patient tolerated exercises well, especially considering that he arrived reporting increased fatigue following chemotherapy injections last week. He is demonstrating improved posture when walking compared to evaluation.     The patient will continue to benefit from skilled Physical Therapy services for balance training, bilateral LE strengthening, endurance, gait training, postural re-training, and education.    Prior Objective Testing  Tests Eval (06/05/23) 06/06/23   5xSTS 20.3 sec, UE support -   - 997 ft  With SPC   TUG and Cognitive TUG - Without AD:  TUG: 14.5 sec  Cognitive TUG:   15.4 sec         Plan for next session: eyes closed/open ambulation, stair navigation (descending is primary concern),     Problem List: decreased bilateral LE strength, impaired balance, impaired posture, deconditioning, and impaired functional mobility    Personal Factors/Comorbidities Present: Bilateral THA , HTN, multiple myeloma, R femoral lytic lesion, acute myeloid leukemia (AML)      Goals    Patient/Family Goals:  floor transfers, balance, preparing for the stem cell transplant    Short Term Goals:  In  12  visits:  Patient will be able to properly demonstrate current HEP independently x1 in clinic to build upon functional gains in PT.   Patient will stand on foam with feet together and eyes closed for 30 sec with minimal sway in open gym space to demonstrate increased confidence in balance.  Patient will complete the Timed Up and Go test without an assistive device in < 13.5 seconds to minimize the risk of falls.  Patient will navigate 8 stairs with unilateral handrail, mod I.   Patient will navigate 6 outdoor curb with assistive device.  Patient will perform floor transfer with external support with SBA.   Patient will stoop to pick up 10 objects off the ground with assistive device.     Long Term Goals:  In 24  visits:  Patient will be able to properly demonstrate updated HEP independently x1 in clinic to build upon functional gains in PT.   Patient will complete 5xSTS without UE support in <15 seconds to demonstrate improved functional LE strength.  Patient will complete the Cognitive Timed Up and Go test without an assistive device in < 17.5 seconds to minimize the risk of falls with increased cognitive demand.   Patient will navigate 16 stairs with unilateral handrail, mod I.   Patient will increase bilateral hip flexor strengthening to 4+/5.  Patient will perform floor transfer without external support with  supervision.  Patient will complete the in >1000 ft with SPC.    PLAN/Recommendations: 3 x a week for 8 weeks  Planned Interventions: Manual Therapy  Gait Training  Therapeutic Activites  Neuromuscular Education  Self-Care  Physical Performance Test  Therapeutic exercise  Balance training  Postural exercises/education  Education  Dry Needling as needed  PT DME/Equipment Recs: none    Home Exercise Program:  -Feet together, eyes open,  3 x hold 30 sec  -standing against wall with scapular retraction for posture and standing endurance, 3 x 1 min  -Mini squats with green band around knees, 3 x 10  - Bridge 3 x10  - side-lying hip abduction 3 x10  - banded seated marching 3 x10    SUBJECTIVE:  Pt states:   - Inside of the thigh was a little sore after the last session.  - He is still feeling weak/tired after a week of chemo.     Reason for Referral/History of Present Condition/Onset of injury/exacerbation:   76 y.o. 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 Referral for conditioning, mobility, BLE strengthening/stretching, balance, core strengthening. Create home exercise program. Patient diagnosed with multiple myeloma in August 2020. He was found to have progressive pancytopenia in Sept 2023 and BMbx was done. 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 He enrolled on BAML S12 arm (ven x 28 days) and began treatment on 11/22/2022. After 1 cycle of treatment he was found to be in complete remission without minimal residual disease. 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 Plans to defer any further multiple myeloma therapy until relapse. He is being considered for a RIC allo stem cell transplant.    Last Fall: June 2024 around pool, needed external support but was able to stand  Precautions: Cancer, Chemotherapy  Red Flags:  none    Prior Functional Status: Working and on feet all the time. Fatigued and then they found the leukemia.    Current Functional Status: Walk 20-minutes per day with cane, half hour watering plants, Tuesdays does laundry. Sometimes uses [employer] for stabilizing. UE and LE resistance bands daily. 7,000 steps/day and 2-3 errands per day.    Currently used Equipment: SPC (only when leaving the house). Has heated pool, treadmill, eliptical  Previous Treatment: PT in 2022-2023 for deconditioning due to multiple myeloma, PT in 2023 for L THA    Social History: Lives with spouse in [place] house with 0 STE, small threshold. 5 steps going into pool unilateral.  Caregiver availability, capability, willingness: Wife retired at home  Independent w/ ADLs?: Yes.   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   -Wife Lurena Joiner) is a retired     OBJECTIVE :  Pain  Current:  0/10 knee pain    Vitals: Pre-session:  BP: 144/70 mmHg  HR: 86 bpm    There Ex x 40 min:    NuStep, L5, profile 3, bilat UE and LE, goal SPM >60 for strength/endurance training, 10 min    Multi-directional stepping exercises for balance/strength training with intermittent UE support and SBA-CGA:   - lateral stepping 8 x30' bilat   - forward/backward walking 5 x30' each way    Ambulation up/down 3, 4 and 2, 6 stairs with 1-2 rails and SBA, multiple reps for strength training.  Pt demonstrated increased UE reliance when walking down stairs. VC for foot position.     Lateral step-ups onto 4 stairs with UE support, 2 x10 bilat      Patient Education: Throughout evaluation patient educated regarding the following: Role of PT in Rehabilitation, HEP, and treatment plan. Patient demonstrated and verbalized agreement and understanding.        Total Time: 40 min   Treatment Rendered:    See above    I attest that I have reviewed the above information.  Signed: Eulas Post, PT  06/21/2023 9:47 AM

## 2023-06-22 MED FILL — VENCLEXTA 100 MG TABLET: ORAL | 28 days supply | Qty: 84 | Fill #2

## 2023-06-26 NOTE — Unmapped (Signed)
NEUROLOGICAL OUTPATIENT PHYSICAL THERAPY   DAILY NOTE    Patient Name: Timothy Porter  Date of Birth:12/28/1945  Date: 06/26/2023  Session Number:  7 (6/10 to reassessment)  Therapy Diagnosis:   Encounter Diagnoses   Name Primary?    Multiple myeloma, remission status unspecified (CMS-HCC) Yes    Impaired functional mobility, balance, and endurance     Physical deconditioning     Muscle weakness          Date of Injury/Onset: Aug 2020  Date of Evaluation: 06/05/2023  Referring Pracitioner: Lindwood Qua*  Certification Dates: 06/06/23-08/28/23      ASSESSMENT:    77 y.o. 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 Patient presents to this clinic to improve balance, strength, and endurance in preparation for a stem cell transplant.     Today's session focused on mobility training. The patient reported intermittent dizziness when performing exercises with head turns or multidirectional movement, but dizziness resolved with a brief rest break. He demonstrated improved tolerance for standing activities today.     The patient will continue to benefit from skilled Physical Therapy services for balance training, bilateral LE strengthening, endurance, gait training, postural re-training, and education.    Prior Objective Testing  Tests Eval (06/05/23) 06/06/23   5xSTS 20.3 sec, UE support -   - 997 ft  With SPC   TUG and Cognitive TUG - Without AD:  TUG: 14.5 sec  Cognitive TUG:   15.4 sec         Plan for next session: eyes closed/open ambulation, stair navigation (descending is primary concern),     Problem List: decreased bilateral LE strength, impaired balance, impaired posture, deconditioning, and impaired functional mobility    Personal Factors/Comorbidities Present: Bilateral THA , HTN, multiple myeloma, R femoral lytic lesion, acute myeloid leukemia (AML)      Goals    Patient/Family Goals:  floor transfers, balance, preparing for the stem cell transplant    Short Term Goals:  In  12  visits:  Patient will be able to properly demonstrate current HEP independently x1 in clinic to build upon functional gains in PT.   Patient will stand on foam with feet together and eyes closed for 30 sec with minimal sway in open gym space to demonstrate increased confidence in balance.  Patient will complete the Timed Up and Go test without an assistive device in < 13.5 seconds to minimize the risk of falls.  Patient will navigate 8 stairs with unilateral handrail, mod I.   Patient will navigate 6 outdoor curb with assistive device.  Patient will perform floor transfer with external support with SBA.   Patient will stoop to pick up 10 objects off the ground with assistive device.     Long Term Goals:  In 24  visits:  Patient will be able to properly demonstrate updated HEP independently x1 in clinic to build upon functional gains in PT.   Patient will complete 5xSTS without UE support in <15 seconds to demonstrate improved functional LE strength.  Patient will complete the Cognitive Timed Up and Go test without an assistive device in < 17.5 seconds to minimize the risk of falls with increased cognitive demand.   Patient will navigate 16 stairs with unilateral handrail, mod I.   Patient will increase bilateral hip flexor strengthening to 4+/5.  Patient will perform floor transfer without external support with  supervision.  Patient will complete the in >1000 ft with SPC.    PLAN/Recommendations: 3 x a week for 8 weeks  Planned Interventions: Manual Therapy  Gait Training  Therapeutic Activites  Neuromuscular Education  Self-Care  Physical Performance Test  Therapeutic exercise  Balance training  Postural exercises/education  Education  Dry Needling as needed  PT DME/Equipment Recs: none    Home Exercise Program:  -Feet together, eyes open,  3 x hold 30 sec  -standing against wall with scapular retraction for posture and standing endurance, 3 x 1 min  -Mini squats with green band around knees, 3 x 10  - Bridge 3 x10  - side-lying hip abduction 3 x10  - banded seated marching 3 x10  - Standing with feet apart on foam pad, horizontal head turns 3 x5 turns bilat    SUBJECTIVE:  Pt states:   - Did some exercise over the weekend. Fatigue is about the same.     Reason for Referral/History of Present Condition/Onset of injury/exacerbation:   77 y.o. 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 Referral for conditioning, mobility, BLE strengthening/stretching, balance, core strengthening. Create home exercise program. Patient diagnosed with multiple myeloma in August 2020. He was found to have progressive pancytopenia in Sept 2023 and BMbx was done. 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 He enrolled on BAML S12 arm (ven x 28 days) and began treatment on 11/22/2022. After 1 cycle of treatment he was found to be in complete remission without minimal residual disease. 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 Plans to defer any further multiple myeloma therapy until relapse. He is being considered for a RIC allo stem cell transplant.    Last Fall: June 2024 around pool, needed external support but was able to stand  Precautions: Cancer, Chemotherapy  Red Flags:  none    Prior Functional Status: Working and on feet all the time. Fatigued and then they found the leukemia.    Current Functional Status: Walk 20-minutes per day with cane, half hour watering plants, Tuesdays does laundry. Sometimes uses [employer] for stabilizing. UE and LE resistance bands daily. 7,000 steps/day and 2-3 errands per day.    Currently used Equipment: SPC (only when leaving the house). Has heated pool, treadmill, eliptical  Previous Treatment: PT in 2022-2023 for deconditioning due to multiple myeloma, PT in 2023 for L THA    Social History: Lives with spouse in [place] house with 0 STE, small threshold. 5 steps going into pool unilateral.  Caregiver availability, capability, willingness: Wife retired at home  Independent w/ ADLs?: Yes.   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   -Wife Lurena Joiner) is a retired     OBJECTIVE :  Pain  Current:  0/10 knee pain    Vitals: Pre-session:  BP: 144/70 mmHg  HR: 86 bpm    There Act x 40 min for mobility training:    Shuttle walks to cones at 10 feet and 20 feet, SBA, no device, 5 rounds    Multi-directional stepping to targets in 2 x3 pattern with CGA:   - random order   - random order with cognitive challenge   - stepping in clockwise and counterclockwise pattern     Seated rest break due to dizziness  Standing with feet apart on foam pad:   - static standing x30 sec   - horizontal head turns 2 x5 turns bilat (added to HEP)    Standing ankle DF/PF x15 with UE support    The patient was provided with an updated written home exercise program, which was reviewed with the patient. The patient verbalized understanding of all exercises and instructions.      Patient Education: Throughout evaluation patient educated regarding the following: Role of PT in Rehabilitation, HEP, and treatment plan. Patient demonstrated and verbalized agreement and understanding.        Total Time: 40 min   Treatment Rendered:    See above    I attest that I have reviewed the above information.  Signed: Eulas Post, PT  06/26/2023 11:01 AM

## 2023-06-29 NOTE — Unmapped (Signed)
NEUROLOGICAL OUTPATIENT PHYSICAL THERAPY   PROGRESS NOTE    Patient Name: Timothy Porter  Date of Birth:[birth year]  Date: 06/29/2023  Session Number:  8 (7/10 to reassessment)  Therapy Diagnosis:   Encounter Diagnoses   Name Primary?    Multiple myeloma, remission status unspecified (CMS-HCC) Yes    Impaired functional mobility, balance, and endurance     Physical deconditioning     Muscle weakness     Chronic left hip pain            Date of Injury/Onset: Aug 2020  Date of Evaluation: 06/05/2023  Referring Pracitioner: Lindwood Qua*  Certification Dates: 06/06/23-08/28/23      ASSESSMENT:  Reporting period: 06/05/23-06/29/2023  77 y.o. 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 Patient presents to this clinic to improve balance, strength, and endurance in preparation for a stem cell transplant.     Today's session focused on assessing progress toward PT goals. The patient has been seen for 8 sessions of PT. He has met his goal for the Timed Up and Go Test, which measures mobility, and is now under the cut-off score for predicting increased falls risk. He is making progress toward his goals for hip flexor strength and and for functional LE strength as measured by the 5 Times Sit to Stand test. He also met his goal for the 6 Minute Walk Test, as he was able to ambulate 1000 feet with the SPC. Long-term PT goals remain appropriate.     The patient will continue to benefit from skilled Physical Therapy services for balance training, bilateral LE strengthening, endurance, gait training, postural re-training, and education.    Prior Objective Testing  Tests Eval (06/05/23) 06/06/23    5xSTS 20.3 sec, UE support - 15.3 sec, UE support   - 997 ft  With SPC 1000 ft with SPC   TUG and Cognitive TUG - Without AD:  TUG: 14.5 sec  Cognitive TUG:   15.4 sec TUG: 12.6 sec, no device  TUG Cognitive: 16.2, no device, no errors         Plan for next session: eyes closed/open ambulation, stair navigation (descending is primary concern),     Problem List: decreased bilateral LE strength, impaired balance, impaired posture, deconditioning, and impaired functional mobility    Personal Factors/Comorbidities Present: Bilateral THA , HTN, multiple myeloma, R femoral lytic lesion, acute myeloid leukemia (AML)      Goals    Patient/Family Goals:  floor transfers, balance, preparing for the stem cell transplant    Short Term Goals:  In  12  visits:  Patient will be able to properly demonstrate current HEP independently x1 in clinic to build upon functional gains in PT.  ONGOING 06/29/2023  Patient will stand on foam with feet together and eyes closed for 30 sec with minimal sway in open gym space to demonstrate increased confidence in balance.  Patient will complete the Timed Up and Go test without an assistive device in < 13.5 seconds to minimize the risk of falls. MET 06/29/2023 12.6 sec, no device  Patient will navigate 8 stairs with unilateral handrail, mod I.   Patient will navigate 6 outdoor curb with assistive device.  Patient will perform floor transfer with external support with SBA.   Patient will stoop to pick up 10 objects off the ground with assistive device. MET 06/29/2023    Long Term  Goals:  In 24  visits:  Patient will be able to properly demonstrate updated HEP independently x1 in clinic to build upon functional gains in PT.   Patient will complete 5xSTS without UE support in <15 seconds to demonstrate improved functional LE strength. PROGRESS 06/29/2023 (15.3 sec, UE support)  Patient will complete the Cognitive Timed Up and Go test without an assistive device in < 17.5 seconds to minimize the risk of falls with increased cognitive demand.   Patient will navigate 16 stairs with unilateral handrail, mod I.   Patient will increase bilateral hip flexor strengthening to 4+/5. PROGRESS 06/29/2023 (4+/5 on L, 4/5 on R)  Patient will perform floor transfer without external support with supervision.  Patient will complete the in >1000 ft with SPC. MET 06/29/2023    PLAN/Recommendations: 3 x a week for 8 weeks from eval  Planned Interventions: Manual Therapy  Gait Training  Therapeutic Activites  Neuromuscular Education  Self-Care  Physical Performance Test  Therapeutic exercise  Balance training  Postural exercises/education  Education  Dry Needling as needed  PT DME/Equipment Recs: none    Home Exercise Program:  -Feet together, eyes open,  3 x hold 30 sec  -standing against wall with scapular retraction for posture and standing endurance, 3 x 1 min  -Mini squats with green band around knees, 3 x 10  - Bridge 3 x10  - side-lying hip abduction 3 x10  - banded seated marching 3 x10  - Standing with feet apart on foam pad, horizontal head turns 3 x5 turns bilat    SUBJECTIVE:  Pt states:   - He is feeling well today. He is follow up with transplant next week.     Reason for Referral/History of Present Condition/Onset of injury/exacerbation:   77 y.o. 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 Referral for conditioning, mobility, BLE strengthening/stretching, balance, core strengthening. Create home exercise program. Patient diagnosed with multiple myeloma in August 2020. He was found to have progressive pancytopenia in Sept 2023 and BMbx was done. 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 He enrolled on BAML S12 arm (ven x 28 days) and began treatment on 11/22/2022. After 1 cycle of treatment he was found to be in complete remission without minimal residual disease. 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 Plans to defer any further multiple myeloma therapy until relapse. He is being considered for a RIC allo stem cell transplant.    Last Fall: June 2024 around pool, needed external support but was able to stand  Precautions: Cancer, Chemotherapy  Red Flags:  none    Prior Functional Status: Working and on feet all the time. Fatigued and then they found the leukemia.    Current Functional Status: Walk 20-minutes per day with cane, half hour watering plants, Tuesdays does laundry. Sometimes uses [employer] for stabilizing. UE and LE resistance bands daily. 7,000 steps/day and 2-3 errands per day.    Currently used Equipment: SPC (only when leaving the house). Has heated pool, treadmill, eliptical  Previous Treatment: PT in 2022-2023 for deconditioning due to multiple myeloma, PT in 2023 for L THA    Social History: Lives with spouse in [place] house with 0 STE, small threshold. 5 steps going into pool unilateral.  Caregiver availability, capability, willingness: Wife retired at home  Independent w/  ADLs?: Yes.   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   -Wife Lurena Joiner) is a retired     OBJECTIVE :  Pain  Current:  0/10 knee pain    Vitals: Pre-session:  BP: 125/71 mmHg  HR: 95 bpm    Physical Performance Test: 40 min    TUG: 12.6 sec, no device  TUG Cognitive: 16.2, no device, no errors    5XSTS test with UE support: 15.3 seconds   Pt unable to perform sit to stand from standard height chair without UE support    : 1,000 feet with SPC  Borg effort: 7/10    Hip flexion MMT: 4+/5 on L, 4/5 on R    Pt able to pick up 10, 6 cones from the ground with SPC and CGA    FTEC on foam in open space: 3 sec      Patient Education: Throughout evaluation patient educated regarding the following: Role of PT in Rehabilitation, HEP, and treatment plan. Patient demonstrated and verbalized agreement and understanding.        Total Time: 40 min   Treatment Rendered:    See above    I attest that I have reviewed the above information.  Signed: Eulas Post, PT  06/29/2023 10:48 AM

## 2023-06-29 NOTE — Unmapped (Deleted)
NEUROLOGICAL OUTPATIENT PHYSICAL THERAPY   EVALUATION AND PLAN OF CARE    Patient Name: Timothy Porter  Date of Birth:[birth year]  Date: 06/29/2023  Session Number:  1  Therapy Diagnosis:   Encounter Diagnoses   Name Primary?    Multiple myeloma, remission status unspecified (CMS-HCC) Yes    Impaired functional mobility, balance, and endurance     Physical deconditioning     Muscle weakness     Chronic left hip pain        Date of Injury/Onset: Aug 2020  Date of Evaluation: 06/05/2023  Referring Pracitioner: Lindwood Qua*  Certification Dates: 06/06/23-08/28/23        ASSESSMENT:    77 y.o. 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 Patient presents to this clinic to improve balance, strength, and endurance in preparation for a stem cell transplant. The patient completes a 5xSTS in 20.3 sec with UE support. This is above the normative value of 12.6 sec without UE support for community-dwelling adults in his age range which suggests the patient demonstrates decreased functional LE strength. Patient stands with forward flexed posture and ambulates with shortened step length suggestive of decreased hip extensor activation. He demonstrates increased instability in standing when talking or holding a conversation suggesting an increased fall risk when cognitive resources are deviated away from balance. Patient will benefit from skilled Physical Therapy services for balance training, bilateral LE strengthening, endurance, gait training, postural re-training, and education.     Plan for next session: Cognitive TUG, , LE strengthening HEP, eyes closed/open ambulation, dual-task ambulation    Problem List: decreased bilateral LE strength, impaired balance, impaired posture, deconditioning, and impaired functional mobility    Prognosis:  Good   Positive Indicators: behavior, good caregiver/family support, motivation, history of compliance, and prior level of function   Negative Indicators: medial status/condition and impaired balance    Personal Factors/Comorbidities Present: Bilateral THA , HTN, multiple myeloma, R femoral lytic lesion, acute myeloid leukemia (AML)    Examination of Body Systems: balance, ROM, strength, posture    Clinical Decision Making: High Complexity Eval Code:  Data from the patient???s history indicates 3 or more personal factors or comorbidities that will affect the treatment of the current problem. Examination of body systems reveals 4 or more body structures, functions, activity limitations and/or participation restrictions and the clinical presentation is unstable. The results of a standardized functional test or outcome measure indicates the clinical decision making was of high complexity.       Goals    Patient/Family Goals:  floor transfers, balance, preparing for the stem cell transplant    Short Term Goals:  In  12  visits:  Patient will be able to properly demonstrate current HEP independently x1 in clinic to build upon functional gains in PT.   Patient will stand on foam with feet together and eyes closed for 30 sec with minimal sway in open gym space to demonstrate increased confidence in balance.  Patient will complete the Timed Up and Go test without an assistive device in < 13.5 seconds to minimize the risk of falls.  Patient will navigate 8 stairs with unilateral handrail, mod I.   Patient will navigate 6 outdoor curb with assistive device.  Patient will perform floor transfer with external support with SBA.   Patient will stoop to pick up 10 objects off the ground with assistive device.  Long Term Goals:  In 24  visits:  Patient will be able to properly demonstrate updated HEP independently x1 in clinic to build upon functional gains in PT.   Patient will complete 5xSTS without UE support in <15 seconds to demonstrate improved functional LE strength.  Patient will complete the Cognitive Timed Up and Go test without an assistive device in < 17.5 seconds to minimize the risk of falls with increased cognitive demand.   Patient will navigate 16 stairs with unilateral handrail, mod I.   Patient will increase bilateral hip flexor strengthening to 4+/5.  Patient will perform floor transfer without external support with supervision.  Patient will complete the in >1000 ft with SPC.    PLAN/Recommendations: 3 x a week for 8 weeks  Planned Interventions: Manual Therapy  Gait Training  Therapeutic Activites  Neuromuscular Education  Self-Care  Physical Performance Test  Therapeutic exercise  Balance training  Postural exercises/education  Education  Dry Needling as needed  PT DME/Equipment Recs: none    Home Exercise Program:  -Feet together, eyes open,  3 x hold 30 sec    SUBJECTIVE:  Pt states:   -ended physical therapy due to leukemia diagnosis December 2023  -uses grabber to pick up objects off the floor  -feels unsteady when going down an escalator, going down stairs  -difficulty putting on shoes and socks due to second hip replacement (L THA)  -intermittent fatigue (someone mows the lawn; given away a lot of activities)  -Just completed 21 days of chemotherapy    Reason for Referral/History of Present Condition/Onset of injury/exacerbation:   77 y.o. 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 Referral for conditioning, mobility, BLE strengthening/stretching, balance, core strengthening. Create home exercise program. Patient diagnosed with multiple myeloma in August 2020. He was found to have progressive pancytopenia in Sept 2023 and BMbx was done. 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 He enrolled on BAML S12 arm (ven x 28 days) and began treatment on 11/22/2022. After 1 cycle of treatment he was found to be in complete remission without minimal residual disease. 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 Plans to defer any further multiple myeloma therapy until relapse. He is being considered for a RIC allo stem cell transplant.    Last Fall: June 2024 around pool, needed external support but was able to stand  Precautions: Cancer, Chemotherapy  Red Flags:  none    Prior Functional Status: Working and on feet all the time. Fatigued and then they found the leukemia.    Current Functional Status: Walk 20-minutes per day with cane, half hour watering plants, Tuesdays does laundry. Sometimes uses [employer] for stabilizing. UE and LE resistance bands daily. 7,000 steps/day and 2-3 errands per day.    Currently used Equipment: SPC (only when leaving the house). Has heated pool, treadmill, eliptical  Previous Treatment: PT in 2022-2023 for deconditioning due to multiple myeloma, PT in 2023 for L THA    Social History: Lives with spouse in [place] house with 0 STE, small threshold. 5 steps going into pool unilateral.  Caregiver availability, capability, willingness: Wife retired at home  Independent w/ ADLs?: Yes.   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   -Wife Lurena Joiner) is a retired     Conservator, museum/gallery  preference: Verbal, Written, and Visual    Barriers to Learning:  n/a    Recent Procedures/Tests/Findings  PET CT Whole Body Scan: 08/22/22    1.No definite evidence of hypermetabolic osseous disease within the limitations of this exam as above.   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          Past Medical History:   Diagnosis Date    Benign prostatic hyperplasia with lower urinary tract symptoms 11/27/2018    BPH (benign prostatic hyperplasia)     Diarrhea     Fatigue     Fractures     left arm    Glucose intolerance     May 2019: HbA1c 6.2.    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 CMV negative 04/07/20.     Pancytopenia (CMS-HCC) 03/14/2023    Prediabetes      Family History   Problem Relation Age of Onset    Dementia Father     No Known Problems Other        Past Surgical History:   Procedure Laterality Date    FRACTURE SURGERY      HERNIA REPAIR      HIP SURGERY      JOINT REPLACEMENT      PR COLONOSCOPY W/BIOPSY SINGLE/MULTIPLE N/A 01/01/2018    Procedure: COLONOSCOPY, FLEXIBLE, PROXIMAL TO SPLENIC FLEXURE; WITH BIOPSY, SINGLE OR MULTIPLE;  Surgeon: Bronson Curb, MD;  Location: [facility]ED[facility];  Service: Gastroenterology    PR IMPLANT MESH HERNIA REPAIR/DEBRIDEMENT CLOSURE  05/07/2017    Procedure: implantation of mesh;  Surgeon: Judithann Graves, MD;  Location: MAIN OR [facility];  Service: Trauma    PR REPAIR ING HERNIA,5+Y/O,REDUCIBL Left 05/07/2017    Procedure: Repair direct and indirect inguinal hernia with mesh;  Surgeon: Judithann Graves, MD;  Location: MAIN OR [facility];  Service: Trauma    PR TOTAL HIP ARTHROPLASTY Right 10/14/2015    Procedure: ARTHROPLASTY, ACETABULAR & PROXIMAL FEMORAL PROSTHETIC REPLACEMENT (TOTAL HIP), W/WO AUTOGRAFT/ALLOGRAFT;  Surgeon: Malka So, MD;  Location: [facility] OR [facility];  Service: Orthopedics    VARICOSE VEIN SURGERY Right       No Known Allergies     Social History     Tobacco Use    Smoking status: Never    Smokeless tobacco: Never   Substance Use Topics    Alcohol use: Yes     Comment: 1 drink a month      Current Outpatient Medications   Medication Sig Dispense Refill    calcium carbonate (TUMS) 200 mg calcium (500 mg) chewable tablet Chew 2 tablets (400 mg of elem calcium total) Three (3) times a day.      cholecalciferol, vitamin D3 25 mcg, 1,000 units,, 1,000 unit (25 mcg) tablet Take 1 tablet (25 mcg total) by mouth.      cholestyramine (QUESTRAN) 4 gram packet Take 1 packet by mouth in the morning and 1 packet in the evening. Take with meals. Mix 4 g (1 packet) into 2-3 ounces of fluid and take twice a  day.. 60 packet 5    cyanocobalamin, vitamin B-12, 2,000 mcg Tab Take 2,000 mcg by mouth in the morning. 90 tablet 3    docusate sodium (COLACE) 50 MG capsule Take 1 capsule (50 mg total) by mouth two (2) times a day.      loratadine (CLARITIN) 10 mg tablet Take 1 tablet (10 mg total) by mouth daily.      metoPROLOL succinate (TOPROL-XL) 50 MG 24 hr tablet Take 1 tablet (50 mg total) by mouth daily. 30 tablet 11    multivitamin with minerals tablet Take 1 tablet by mouth daily.      omeprazole (PRILOSEC) 40 MG capsule TAKE 1 CAPSULE(40 MG) BY MOUTH DAILY 90 capsule 0    ondansetron (ZOFRAN) 8 MG tablet Take 1 tablet (8 mg total) by mouth every eight (8) hours as needed for nausea. 30 tablet 2    sildenafiL, pulm.hypertension, (REVATIO) 20 mg tablet Take 1-5 tablets (20-100 mg total) by mouth daily as needed (for erectile function). 90 tablet 3    tamsulosin (FLOMAX) 0.4 mg capsule Take 1 capsule (0.4 mg total) by mouth daily. 90 capsule 0    valACYclovir (VALTREX) 500 MG tablet Take 1 tablet (500 mg total) by mouth daily. 90 tablet 3    venetoclax (VENCLEXTA) 100 mg tablet Take 4 tablets (400 mg total) by mouth daily for 21 days. 84 tablet 4     No current facility-administered medications for this visit.     Facility-Administered Medications Ordered in Other Visits   Medication Dose Route Frequency Provider Last Rate Last Admin    DTaP-hepatitis B recombinant-IPV (PEDIARIX) 10 mcg-25Lf-25 mcg-10Lf/0.5 mL injection             DTaP-hepatitis B recombinant-IPV (PEDIARIX) 10 mcg-25Lf-25 mcg-10Lf/0.5 mL injection             haemophilus B polysac-tetanus toxoid (ActHIB) 10 mcg/0.5 mL injection             influenza vaccine quad (FLUARIX, FLULAVAL, FLUZONE) (6 MOS & UP) 2020-21 ADS Med             pneumococcal conjugate (13-valent) (PREVNAR-13) 0.5 mL vaccine             pneumococcal conjugate (13-valent) (PREVNAR-13) 0.5 mL vaccine             varicella-zoster gE-AS01B (PF) (SHINGRIX) 50 mcg/0.5 mL injection                     OBJECTIVE :  Posture:    Sitting: rounded shoulders and foward head   Standing: foward flexed posture, rounded shoulders, foward heard, increased thoracic kyphosis, and genu valgum  Skin assessment/Edema: n/a    Pain  Current:  0/10     Vitals:  BP: 124/68 mmHg  SpO2: 96%  HR: 96 bpm    Sensation:  Numbness and tingling on 5th digit on L hand     Vision:  Wears glasses    Vestibular:    Denies dizziness    Range of Motion:   UE WNL    Strength/MMT:  UE MMT   UE MMT Left Right   Shoulder flex: 5/5 5/5   Shoulder abd: (C5) 5/5 5/5   Elbow flex:  (C6) 5/5 5/5   Elbow ext: (C7) 5/5 5/5   Wrist ext: (C6) 5/5 5/5   Finger Flex/Grasp:  5/5 5/5         LE MMT   LE MMT Left Right   Hip  flex:  (L2) 3/5 3/5   Knee ext:  (L3) 5/5 5/5   Knee flex: (S2) 4/5 4/5   Ankle DF:  (L4) 5/5 5/5        Motor Function/Coordination:   Normal Rapid alternating movement   Toe taps: intact    Transfers:    Sit<>Stand: indep with UE support     Balance:  Assess with modified CTSIB    Gait Analysis:   Shortened bilateral step length  Decreased bilateral hip extension  Forward flexed posture  Decreased gait speed       Functional Tests/Outcome Measures:      5xSTS:  20.3 sec, UE support  Unable to complete without UEs    mCTSIB:  mCTSIB:  Firm surface:              Feet together, eyes open: 30 seconds with min sway              Feet together, eyes closed: 30 seconds with mod sway     Foam surface:              Feet together, eyes open: 30 seconds with mod sway Feet together, eyes closed: 30 seconds with significant sway       Patient Education: Throughout evaluation patient educated regarding the following: Role of PT in Rehabilitation, HEP, and treatment plan. Patient demonstrated and verbalized agreement and understanding.        Communication/consultation with other professionals:  medicare Cert/POC sent to referring practitioner    Total Time: 45 min   PT Evaluation: 40 min   Treatment Rendered:    Therapeutic Exercise: 5 mins - Home Exercise Program  -Feet together, eyes open,  3 x hold 30 sec    I attest that I have reviewed the above information.  Signed: Eulas Post, PT, SPT  06/29/2023 10:57 AM    The care for this patient was completed by Eulas Post, PT:  A student was present and participated in the care. Licensed/Credentialed therapist was physically present and immediately available to direct and supervise tasks that were related to patient management. The direction and supervision was continuous throughout the time these tasks were performed.    Eulas Post, PT

## 2023-07-02 ENCOUNTER — Ambulatory Visit
Admit: 2023-07-02 | Discharge: 2023-07-03 | Payer: MEDICARE | Attending: Physical Medicine & Rehabilitation | Primary: Physical Medicine & Rehabilitation

## 2023-07-02 DIAGNOSIS — Z7409 Other reduced mobility: Principal | ICD-10-CM

## 2023-07-02 DIAGNOSIS — R5381 Other malaise: Principal | ICD-10-CM

## 2023-07-02 NOTE — Unmapped (Unsigned)
Physical Medicine and Rehab  Clinic Note          Chief Complaint: Mobility impairment    History of Present Illness: Timothy Porter is a 77 y.o. male with a past medical history of AML s/p ven, azacytidine along with multiple myeloma s/p autoSCT, lenalidomide maintenance presenting with mobility impairment.    He was last seen on 05/03/23. At that time he was counseled about exercise and protein and ultimately went to PT. Per discussion with his PT, he has had improved strength and mobility.    Today he states that he feels much stronger since participating in PT. He has a HEP that he does diligently and wants to get a SCT.     He denies other symptoms today.    Cancer History:  Hematology/Oncology History Overview Note   Referring/Local Oncologist:  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      This electronic signature is attestation that the pathologist personally reviewed the submitted material(s) and the final diagnosis reflects that evaluation.          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,XY[7]         Myeloid Mutational Panel:        Variants of Known/Likely Clinical Significance:   Gene Coding Predicted Protein Variant allele fraction   SRSF2 c.284C>G p.(Pro95Arg) 21.6 %       Treatment Timeline:  11/22/2022 Cycle 1:  BAML S12 azacitidine 75mg /m2 x 7 days + venetoclax 400mg  x 28 days  12/13/22: Bmbx - Limited, though no increase in blasts, MRD-neg by flow, poor specimen but no blasts (at <0.2%)   01/04/2023 Cycle 2: aza 75 x 7 + ven 400 x 28 days (2 week delay)  01/23/2023: BMbx: <5% blasts by CD34 immunohistochemistry, flow MRD negative  02/09/2023: Cycle 3: Aza 75 x 7 + ven 400 x 21  03/19/2023: Cycle 4 Aza 75 x 5 + ven 400 x 21 (Aza decreased due to delayed count recovery)  04/16/2023: Cycle 5: Aza 75 x 5 + ven 400 x 21  05/14/2023: Cycle 6: Aza 75 x 5 + ven 400 x 21  06/11/2023: Cycle 7: Aza 75 x 5 + ven 400 x 21     Multiple myeloma (CMS-HCC)   02/12/2019 Initial Diagnosis    Multiple myeloma (CMS-HCC)     05/28/2019 - 06/03/2019 Chemotherapy    BMT IP AUTO MELPHALAN  Melphalan 140 mg/m2 or 200 mg/m2 IV Day -1     10/22/2019 - 06/27/2022 Chemotherapy    STUDY [id number] IRB# [id number] LENALIDOMIDE (v. 11/04/18)  A Randomized Study of Daratumumab Plus Lenalidomide Versus Lenalidomide Alone as Maintenance Treatment in Patients with Newly Diagnosed Multiple Myeloma Who Are Minimal Residual Disease Positive After Frontline Autologous Stem Cell Transplant.     History of auto stem cell transplant (CMS-HCC) (Resolved)   08/05/2019  Initial Diagnosis    History of auto stem cell transplant (CMS-HCC)     10/22/2019 - 06/27/2022 Chemotherapy    STUDY [id number] IRB# [id number] LENALIDOMIDE (v. 11/04/18)  A Randomized Study of Daratumumab Plus Lenalidomide Versus Lenalidomide Alone as Maintenance Treatment in Patients with Newly Diagnosed Multiple Myeloma Who Are Minimal Residual Disease Positive After Frontline Autologous Stem Cell Transplant.     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 CMV negative 04/07/20.     Pancytopenia (CMS-HCC) 03/14/2023    Prediabetes      Past Surgical History:   Procedure Laterality Date    FRACTURE SURGERY      HERNIA REPAIR      HIP SURGERY      JOINT REPLACEMENT      PR COLONOSCOPY W/BIOPSY SINGLE/MULTIPLE N/A 01/01/2018    Procedure: COLONOSCOPY, FLEXIBLE, PROXIMAL TO SPLENIC FLEXURE; WITH BIOPSY, SINGLE OR MULTIPLE;  Surgeon: Bronson Curb, MD;  Location: [facility];  Service: Gastroenterology    PR IMPLANT MESH HERNIA REPAIR/DEBRIDEMENT CLOSURE  05/07/2017    Procedure: implantation of mesh;  Surgeon: Judithann Graves, MD;  Location: MAIN OR [facility];  Service: Trauma    PR REPAIR ING HERNIA,5+Y/O,REDUCIBL Left 05/07/2017    Procedure: Repair direct and indirect inguinal hernia with mesh;  Surgeon: Judithann Graves, MD;  Location: MAIN OR [facility];  Service: Trauma    PR TOTAL HIP ARTHROPLASTY Right 10/14/2015    Procedure: ARTHROPLASTY, ACETABULAR & PROXIMAL FEMORAL PROSTHETIC REPLACEMENT (TOTAL HIP), W/WO AUTOGRAFT/ALLOGRAFT;  Surgeon: Malka So, MD;  Location: [facility] OR [facility];  Service: Orthopedics    VARICOSE VEIN SURGERY Right         Social History:   Social History     Tobacco Use    Smoking status: Never    Smokeless tobacco: Never   Vaping Use    Vaping status: Never Used   Substance Use Topics    Alcohol use: Yes     Comment: 1 drink a month    Drug use: No            Family History: Reviewed and non-contributory to rehab needs  family history includes Dementia in his father; No Known Problems in an other family member.    Allergies:   Patient has no known allergies.    Medications:   Current Outpatient Medications   Medication Instructions    calcium carbonate (TUMS) 200 mg calcium (500 mg) chewable tablet 400 mg elem calcium, Oral, 3 times a  day (standard)    cholecalciferol (vitamin D3 25 mcg (1,000 units)) 1,000 Units, Oral    cholestyramine (QUESTRAN) 4 gram packet 1 packet, Oral, 2 times a day with meals, Mix 4 g (1 packet) into 2-3 ounces of fluid and take twice a day.    cyanocobalamin (vitamin B-12) 2,000 mcg, Oral, Daily    docusate sodium (COLACE) 50 mg, Oral, 2 times a day (standard)    loratadine (CLARITIN) 10 mg, Oral, Daily (standard)    metoPROLOL succinate (TOPROL-XL) 50 mg, Oral, Daily (standard)    multivitamin with minerals tablet 1 tablet, Oral, Daily (standard)    omeprazole (PRILOSEC) 40 mg, Oral    ondansetron (ZOFRAN) 8 mg, Oral, Every 8 hours PRN    sildenafiL (pulm.hypertension) (REVATIO) 20-100 mg, Oral, Daily PRN    tamsulosin (FLOMAX) 0.4 mg, Oral, Daily (standard)    valACYclovir (VALTREX) 500 mg, Oral, Daily (standard)    VENCLEXTA 400 mg, Oral, Daily (standard)         Review of Systems:    General ROS:   Full 10 systems reviewed and neg, unless noted in HPI  OBJECTIVE:     Vitals:  Vitals:    07/02/23 0812   BP: 166/78   Pulse: 81   Resp: 18   Temp: 36.6 ??C (97.9 ??F)   SpO2: 98%           Physical Exam:    GEN: Sitting in bedside chair in NAD.  HEENT: Atraumatic. Normocephalic. Moist mucous membranes. Trachea midline.  RESP: NWOB on RA.  SKIN: no rashes or ecchymoses on exposed skin    NEURO:  Mental Status: A&Ox3, attention full, speech fluid and coherent, follows commands   Motor:     RUE/LUE: shoulder abd 5/5, biceps 5/5    RLE/LLE: hip flexion 4/4, knee extension 5/5  Cerebellar: no abnml or extraneous mvmts  PSYCH: mood euthymic, affect appropriate, thought process logical     Labs and Diagnostic Studies: Reviewed     06/14/23  12.2/36.2    CBC -   No results found for requested labs within last 2 days.     BMP -   No results found for requested labs within last 2 days.     Coagulation -   No results found for requested labs within last 2 days.     Cardiac markers -   No results found for requested labs within last 2 days.     LFT's -   No results found for requested labs within last 2 days.       Radiology Results: Reviewed PET 08/22/22  Impression   1.No definite evidence of hypermetabolic osseous disease within the limitations of this exam as above.   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       ASSESSMENT / RECOMMENDATIONS:     Timothy Porter is a 78 y.o. male with a past medical history of AML s/p ven, azacytidine along with multiple myeloma s/p autoSCT, lenalidomide maintenance presenting with mobility impairment.    Functional Impairment(s) and Rehabilitation Plan of Care    Mobility impairment with deconditioning  Discussed symptoms in depth; much improved  Continue HEP from PT  Encouraged increased mobility and walking  Discussed exercises to do including squats and balance exercises  Encouraged protein intake  Messaged oncology team about progress    Follow up in 3 months or sooner if needed    _______________________________________________  Corrin Parker, MD  Director of  Cancer Rehabilitation  Department of Physical Medicine and Rehabilitation  [facility] [place] needed    _______________________________________________  Corrin Parker, MD  Director of Cancer Rehabilitation  Department of Physical Medicine and Rehabilitation  [facility] [place]

## 2023-07-03 DIAGNOSIS — Z7682 Awaiting organ transplant status: Principal | ICD-10-CM

## 2023-07-03 NOTE — Unmapped (Signed)
NEUROLOGICAL OUTPATIENT PHYSICAL THERAPY   DAILY NOTE    Patient Name: Timothy Porter  Date of Birth:23-Dec-1945  Date: 07/03/2023  Session Number:  9 (1/10 to reassessment)  Therapy Diagnosis:   Encounter Diagnoses   Name Primary?    Multiple myeloma, remission status unspecified (CMS-HCC) Yes    Impaired functional mobility, balance, and endurance     Physical deconditioning     Muscle weakness              Date of Injury/Onset: Aug 2020  Date of Evaluation: 06/05/2023  Referring Pracitioner: Lindwood Qua*  Certification Dates: 06/06/23-08/28/23      ASSESSMENT:  Reporting period: 06/05/23-06/29/2023  77 y.o. 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 Patient presents to this clinic to improve balance, strength, and endurance in preparation for a stem cell transplant.     Today's session focused on functional strengthening. With cueing, the patient demonstrated good knee alignment and control when squatting to the floor to pick up cones. He has a tendency to catch his foot when stepping on/off the Airex pad, but this improved with cueing.     The patient will continue to benefit from skilled Physical Therapy services for balance training, bilateral LE strengthening, endurance, gait training, postural re-training, and education.    Prior Objective Testing  Tests Eval (06/05/23) 06/06/23    5xSTS 20.3 sec, UE support - 15.3 sec, UE support   - 997 ft  With SPC 1000 ft with SPC   TUG and Cognitive TUG - Without AD:  TUG: 14.5 sec  Cognitive TUG:   15.4 sec TUG: 12.6 sec, no device  TUG Cognitive: 16.2, no device, no errors         Plan for next session: eyes closed/open ambulation, stair navigation (descending is primary concern), step/return with cane    Problem List: decreased bilateral LE strength, impaired balance, impaired posture, deconditioning, and impaired functional mobility    Personal Factors/Comorbidities Present: Bilateral THA , HTN, multiple myeloma, R femoral lytic lesion, acute myeloid leukemia (AML)      Goals    Patient/Family Goals:  floor transfers, balance, preparing for the stem cell transplant    Short Term Goals:  In  12  visits:  Patient will be able to properly demonstrate current HEP independently x1 in clinic to build upon functional gains in PT.  ONGOING 06/29/2023  Patient will stand on foam with feet together and eyes closed for 30 sec with minimal sway in open gym space to demonstrate increased confidence in balance.  Patient will complete the Timed Up and Go test without an assistive device in < 13.5 seconds to minimize the risk of falls. MET 06/29/2023 12.6 sec, no device  Patient will navigate 8 stairs with unilateral handrail, mod I.   Patient will navigate 6 outdoor curb with assistive device.  Patient will perform floor transfer with external support with SBA.   Patient will stoop to pick up 10 objects off the ground with assistive device. MET 06/29/2023    Long Term Goals:  In 24  visits:  Patient will be able to properly demonstrate updated HEP independently x1 in clinic to build upon functional gains in PT.   Patient will complete 5xSTS without UE support in <15 seconds to demonstrate improved functional LE strength. PROGRESS 06/29/2023 (15.3 sec, UE support)  Patient will complete the Cognitive Timed Up and  Go test without an assistive device in < 17.5 seconds to minimize the risk of falls with increased cognitive demand.   Patient will navigate 16 stairs with unilateral handrail, mod I.   Patient will increase bilateral hip flexor strengthening to 4+/5. PROGRESS 06/29/2023 (4+/5 on L, 4/5 on R)  Patient will perform floor transfer without external support with supervision.  Patient will complete the in >1000 ft with SPC. MET 06/29/2023    PLAN/Recommendations: 3 x a week for 8 weeks from eval  Planned Interventions: Manual Therapy  Gait Training  Therapeutic Activites  Neuromuscular Education  Self-Care  Physical Performance Test  Therapeutic exercise  Balance training  Postural exercises/education  Education  Dry Needling as needed  PT DME/Equipment Recs: none    Home Exercise Program:  -Feet together, eyes open,  3 x hold 30 sec  -standing against wall with scapular retraction for posture and standing endurance, 3 x 1 min  -Mini squats with green band around knees, 3 x 10  - Bridge 3 x10  - side-lying hip abduction 3 x10  - banded seated marching 3 x10  - Standing with feet apart on foam pad, horizontal head turns 3 x5 turns bilat  - Build up lunge with aerobic step on 2 risers and Airex pad, plus bilat UE support, 2 x5-10    SUBJECTIVE:  Pt states:   - He got the green light for transplant. He is waiting to find out when that will happen. His sister is the donor. Feels a little tired today.     Reason for Referral/History of Present Condition/Onset of injury/exacerbation:   77 y.o. male with standard-risk kappa free light chain multiple myeloma presents to the [facility] with impaired endurance, strength, and balance in preparation for stem cell transplant. Referral for conditioning, mobility, BLE strengthening/stretching, balance, core strengthening. Create home exercise program. Patient diagnosed with multiple myeloma in August 2020. He was found to have progressive pancytopenia in Sept 2023 and BMbx was done. 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 He enrolled on BAML S12 arm (ven x 28 days) and began treatment on 11/22/2022. After 1 cycle of treatment he was found to be in complete remission without minimal residual disease. 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 Plans to defer any further multiple myeloma therapy until relapse. He is being considered for a [facility] allo stem cell transplant.    Last Fall: June 2024 around pool, needed external support but was able to stand  Precautions: Cancer, Chemotherapy  Red Flags:  none    Prior Functional Status: Working and on feet all the time. Fatigued and then they found the leukemia.    Current Functional Status: Walk 20-minutes per day with cane, half hour watering plants, Tuesdays does laundry. Sometimes uses [employer] for stabilizing. UE and LE resistance bands daily. 7,000 steps/day and 2-3 errands per day.    Currently used Equipment: SPC (only when leaving the house). Has heated pool, treadmill, eliptical  Previous Treatment: PT in 2022-2023 for deconditioning due to multiple myeloma, PT in 2023 for L THA    Social History: Lives with spouse in [place] house with 0 STE, small threshold. 5 steps going into pool unilateral.  Caregiver availability, capability, willingness: Wife retired at home  Independent w/ ADLs?: Yes.   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   -Wife Lurena Joiner) is a retired  OBJECTIVE :  Pain  Current:  0/10 knee pain    Therex: 25 min for strength training    Build up lunge with aerobic step on 2 risers and Airex pad, plus bilat UE support:   - set 1: x10 bilat   - set 2: x7 on L, x9 on R  Added to HEP    Picking up 8, 6 cones from the ground with SPC and CGA, 2 sets with VC for base of support and knee alignment (for functional strengthening)    NM Re-Ed: 15 min for balance training    Exercises with Airex pad, with SBA-CGA:   - feet apart, eyes open: horizontal and vertical head turns x30 sec bilat   - feet apart, eyes closed 2 x30 sec intervals   - forward step-ups with single UE support x10 bilat   - lateral step up and overs with bilat UE support x10 each way      Patient Education: Throughout evaluation patient educated regarding the following: Role of PT in Rehabilitation, HEP, and treatment plan. Patient demonstrated and verbalized agreement and understanding.        Total Time: 40 min   Treatment Rendered:    See above    I attest that I have reviewed the above information.  Signed: Eulas Post, PT  07/03/2023 10:48 AM

## 2023-07-04 DIAGNOSIS — Z7682 Awaiting organ transplant status: Principal | ICD-10-CM

## 2023-07-04 NOTE — Unmapped (Signed)
Spoke with Timothy Porter today who is getting ready for his stem cell replacement. He has a lot going on and would like a call back in a few months.

## 2023-07-09 ENCOUNTER — Ambulatory Visit: Admit: 2023-07-09 | Discharge: 2023-07-10 | Payer: MEDICARE

## 2023-07-09 ENCOUNTER — Other Ambulatory Visit: Admit: 2023-07-09 | Discharge: 2023-07-10 | Payer: MEDICARE

## 2023-07-09 ENCOUNTER — Encounter
Admit: 2023-07-09 | Discharge: 2023-07-10 | Payer: MEDICARE | Attending: Nurse Practitioner | Primary: Nurse Practitioner

## 2023-07-09 DIAGNOSIS — C9000 Multiple myeloma not having achieved remission: Principal | ICD-10-CM

## 2023-07-09 DIAGNOSIS — C9200 Acute myeloblastic leukemia, not having achieved remission: Principal | ICD-10-CM

## 2023-07-09 DIAGNOSIS — C9201 Acute myeloblastic leukemia, in remission: Principal | ICD-10-CM

## 2023-07-09 LAB — CBC W/ AUTO DIFF
BASOPHILS ABSOLUTE COUNT: 0.1 10*9/L (ref 0.0–0.1)
BASOPHILS RELATIVE PERCENT: 1.8 %
EOSINOPHILS ABSOLUTE COUNT: 0 10*9/L (ref 0.0–0.5)
EOSINOPHILS RELATIVE PERCENT: 0 %
HEMATOCRIT: 38.4 % — ABNORMAL LOW (ref 39.0–48.0)
HEMOGLOBIN: 13.2 g/dL (ref 12.9–16.5)
LYMPHOCYTES ABSOLUTE COUNT: 1.7 10*9/L (ref 1.1–3.6)
LYMPHOCYTES RELATIVE PERCENT: 48.8 %
MEAN CORPUSCULAR HEMOGLOBIN CONC: 34.3 g/dL (ref 32.0–36.0)
MEAN CORPUSCULAR HEMOGLOBIN: 34.3 pg — ABNORMAL HIGH (ref 25.9–32.4)
MEAN CORPUSCULAR VOLUME: 100 fL — ABNORMAL HIGH (ref 77.6–95.7)
MEAN PLATELET VOLUME: 6.9 fL (ref 6.8–10.7)
MONOCYTES ABSOLUTE COUNT: 0.4 10*9/L (ref 0.3–0.8)
MONOCYTES RELATIVE PERCENT: 12.8 %
NEUTROPHILS ABSOLUTE COUNT: 1.2 10*9/L — ABNORMAL LOW (ref 1.8–7.8)
NEUTROPHILS RELATIVE PERCENT: 36.6 %
PLATELET COUNT: 216 10*9/L (ref 150–450)
RED BLOOD CELL COUNT: 3.84 10*12/L — ABNORMAL LOW (ref 4.26–5.60)
RED CELL DISTRIBUTION WIDTH: 15.4 % — ABNORMAL HIGH (ref 12.2–15.2)
WBC ADJUSTED: 3.4 10*9/L — ABNORMAL LOW (ref 3.6–11.2)

## 2023-07-09 LAB — COMPREHENSIVE METABOLIC PANEL
ALBUMIN: 3.8 g/dL (ref 3.4–5.0)
ALKALINE PHOSPHATASE: 81 U/L (ref 46–116)
ALT (SGPT): 41 U/L (ref 10–49)
ANION GAP: 8 mmol/L (ref 5–14)
AST (SGOT): 30 U/L (ref <=34)
BILIRUBIN TOTAL: 0.8 mg/dL (ref 0.3–1.2)
BLOOD UREA NITROGEN: 16 mg/dL (ref 9–23)
BUN / CREAT RATIO: 19
CALCIUM: 8.8 mg/dL (ref 8.7–10.4)
CHLORIDE: 108 mmol/L — ABNORMAL HIGH (ref 98–107)
CO2: 28 mmol/L (ref 20.0–31.0)
CREATININE: 0.86 mg/dL
EGFR CKD-EPI (2021) MALE: 90 mL/min/{1.73_m2} (ref >=60)
GLUCOSE RANDOM: 113 mg/dL (ref 70–179)
POTASSIUM: 4.1 mmol/L (ref 3.4–4.8)
PROTEIN TOTAL: 6.4 g/dL (ref 5.7–8.2)
SODIUM: 144 mmol/L (ref 135–145)

## 2023-07-09 LAB — URIC ACID: URIC ACID: 3.2 mg/dL — ABNORMAL LOW

## 2023-07-09 LAB — MAGNESIUM: MAGNESIUM: 1.7 mg/dL (ref 1.6–2.6)

## 2023-07-09 LAB — LACTATE DEHYDROGENASE: LACTATE DEHYDROGENASE: 172 U/L (ref 120–246)

## 2023-07-09 MED ADMIN — azacitidine (VIDAZA) syringe: 75 mg/m2 | SUBCUTANEOUS | @ 14:00:00 | Stop: 2023-07-09

## 2023-07-09 MED ADMIN — ondansetron (ZOFRAN) tablet 8 mg: 8 mg | ORAL | @ 14:00:00 | Stop: 2023-07-09

## 2023-07-09 NOTE — Unmapped (Signed)
0865 Pt arrived for scheduled injection. Pt tolerated injection and discharged to home from clinic.

## 2023-07-09 NOTE — Unmapped (Signed)
I spoke with patient Timothy Porter to confirm appointments on the following date(s):     08/09/23 labs and [employer]

## 2023-07-09 NOTE — Unmapped (Signed)
[facility] Leukemia Clinic Visit Note     Patient Name: Timothy Porter  Patient Age: 77 y.o.  Encounter Date: 07/09/2023    Primary Care Provider:  Pcp, None Per Patient    Referring Physician:  Virgil Benedict, AGNP  [address]  [id number]  [place],  [place] [address]    Reason for visit:  AML     Assessment:  Timothy Porter is a 77 y.o. 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  He enrolled on BAML S12 arm (ven x 28 days) and began treatment on 11/22/2022. After 1 cycle of treatment he was found to be in CR without MRD.  He completed the study portion of 2 cycles and has been continued on [facility] aza/ven since  then. He has completed 7 cycles of treatment and presents to clinic today for follow up and consideration of cycle 8.     Timothy Porter is tolerating treatment on aza/ven quite well without complications or toxicities.  Today on lab review his plts are 216 and ANC 1.2 and are sufficient to proceed with cycle 8 today.  He will receive azacitidine 75mg /m2 x 5 days + venetoclax 400mg  x 21 days.  He should not require any additional lab monitoring between cycles and should continue valtrex for ppx.  Timothy Porter has been cleared by PT to proceed with transplant.  He is currently scheduled to proceed early part of Sept with repeat BMbx scheduled 9/5.  If all goes accordingly this will be his last cycle of treatment prior to transplant. I did explain that after transplant we will likely get him plugged back into our clinic for continues surveillance.   I will schedule him to RTC to follow up with [name] in ~4wks    Free Kappa Chain MM:  No evidence of progression at this time.  He will continue follow up with MM team/[name].           Diarrhea: Resolved with increasing cholestyramine.  Stools are now formed with decrease in urgency.                                              Plan:   - proceed with cycle 8 azacitidine 75mg /m2 x 5 days + venetoclax 400mg  x 21 days.   - valtrex only for ppx.   - BMbx 9/5 per BMT  - RTC 9/12 for follow up with [name] prior to transplant      [name] was available    [name], AGNP-BC  Leukemia Research Nurse Practitioner  Hematology/Oncology Division  [facility]  07/09/2023    I personally spent 45 minutes face-to-face and non-face-to-face in the care of this patient, which includes all pre, intra, and post visit time on the date of service.  All documented time was specific to the E/M visit and does not include any procedures that may have been performed.  Hematology/Oncology History Overview Note   Referring/Local Oncologist:  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      This electronic signature is attestation that the pathologist personally reviewed the submitted material(s) and the final diagnosis reflects that evaluation.          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,XY[7]         Myeloid Mutational Panel:        Variants of Known/Likely Clinical Significance:   Gene Coding Predicted Protein Variant allele fraction   SRSF2 c.284C>G p.(Pro95Arg) 21.6 %       Treatment Timeline:  11/22/2022 Cycle 1:  BAML S12 azacitidine 75mg /m2 x 7 days + venetoclax 400mg  x 28 days  12/13/22: Bmbx - Limited, though no increase in blasts, MRD-neg by flow, poor specimen but no blasts (at <0.2%)   01/04/2023 Cycle 2: aza 75 x 7 + ven 400 x 28 days (2 week delay)  01/23/2023: BMbx: <5% blasts by CD34 immunohistochemistry, flow MRD negative  02/09/2023: Cycle 3: Aza 75 x 7 + ven 400 x 21  03/19/2023: Cycle 4 Aza 75 x 5 + ven 400 x 21 (Aza decreased due to delayed count recovery)  04/16/2023: Cycle 5: Aza 75 x 5 + ven 400 x 21  05/14/2023: Cycle 6: Aza 75 x 5 + ven 400 x 21  06/11/2023: Cycle 7: Aza 75 x 5 + ven 400 x 21     Multiple myeloma (CMS-HCC)   02/12/2019 Initial Diagnosis    Multiple myeloma (CMS-HCC)     05/28/2019 - 06/03/2019 Chemotherapy    BMT IP AUTO MELPHALAN  Melphalan 140 mg/m2 or 200 mg/m2 IV Day -1     10/22/2019 - 06/27/2022 Chemotherapy    STUDY [id number] IRB# [id number] LENALIDOMIDE (v. 11/04/18)  A Randomized Study of Daratumumab Plus Lenalidomide Versus Lenalidomide Alone as Maintenance Treatment in Patients with Newly Diagnosed Multiple Myeloma Who Are Minimal Residual Disease Positive After Frontline Autologous Stem Cell Transplant.     History of auto stem cell transplant (CMS-HCC) (Resolved)   08/05/2019 Initial Diagnosis    History of auto stem cell transplant (CMS-HCC)     10/22/2019 - 06/27/2022 Chemotherapy    STUDY [id number] IRB# [id number] LENALIDOMIDE (v. 11/04/18)  A Randomized Study of Daratumumab Plus Lenalidomide Versus Lenalidomide Alone as Maintenance Treatment in Patients with Newly Diagnosed Multiple Myeloma Who Are Minimal Residual Disease Positive After Frontline Autologous Stem Cell Transplant.     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  He has been working with PT to improve his leg strength in preparation for transplant and has now been cleared.  He reports  good energy and appetite.  He denies any n/v/d.  No new cough/sob.  No new complaints today.     Otherwise, he denies new constitutional symptoms such as anorexia, weight loss, fatigue, night sweats or unexplained fevers.  Furthermore, he denies unexplained bleeding or bruising, recurrent or unexplained intercurrent infections, dyspnea on exertion, lightheadedness, palpitations or chest pain.  There have been no new or unexplained pains or self-identified masses, swelling or enlarged lymph nodes.    ROS reviewed and negative except as noted in H and P     ECOG: 1    Allergies:  No Known Allergies    Medications:   Current Outpatient Medications:     calcium carbonate (TUMS) 200 mg calcium (500 mg) chewable tablet, Chew 2 tablets (400 mg of elem calcium total) Three (3) times a day., Disp: , Rfl:     cholecalciferol, vitamin D3 25 mcg, 1,000 units,, 1,000 unit (25 mcg) tablet, Take 1 tablet (25 mcg total) by mouth., Disp: , Rfl:     cholestyramine (QUESTRAN) 4 gram packet, Take 1 packet by mouth in the morning and 1 packet in the evening. Take with meals. Mix 4 g (1 packet) into 2-3 ounces of fluid and take twice a day.., Disp: 60 packet, Rfl: 5    cyanocobalamin, vitamin B-12, 2,000 mcg Tab, Take 2,000 mcg by mouth in the morning., Disp: 90 tablet, Rfl: 3    docusate sodium (COLACE) 50 MG capsule, Take 1 capsule (50 mg total) by mouth two (2) times a day., Disp: , Rfl:     loratadine (CLARITIN) 10 mg tablet, Take 1 tablet (10 mg total) by mouth daily., Disp: , Rfl:     metoPROLOL succinate (TOPROL-XL) 50 MG 24 hr tablet, Take 1 tablet (50 mg total) by mouth daily., Disp: 30 tablet, Rfl: 11    multivitamin with minerals tablet, Take 1 tablet by mouth daily., Disp: , Rfl:     omeprazole (PRILOSEC) 40 MG capsule, TAKE 1 CAPSULE(40 MG) BY MOUTH DAILY, Disp: 90 capsule, Rfl: 0    ondansetron (ZOFRAN) 8 MG tablet, Take 1 tablet (8 mg total) by mouth every eight (8) hours as needed for nausea., Disp: 30 tablet, Rfl: 2    sildenafiL, pulm.hypertension, (REVATIO) 20 mg tablet, Take 1-5 tablets (20-100 mg total) by mouth daily as needed (for erectile function)., Disp: 90 tablet, Rfl: 3    tamsulosin (FLOMAX) 0.4 mg capsule, Take 1 capsule (0.4 mg total) by mouth daily., Disp: 90 capsule, Rfl: 0    valACYclovir (VALTREX) 500 MG tablet, Take 1 tablet (500 mg total) by mouth daily., Disp: 90 tablet, Rfl: 3    venetoclax (VENCLEXTA) 100 mg tablet, Take 4 tablets (400 mg total) by mouth daily for 21 days., Disp: 84 tablet, Rfl: 4  No current facility-administered medications for this visit.    Facility-Administered Medications Ordered in Other Visits:     azacitidine (VIDAZA) syringe, 75 mg/m2 (Treatment Plan Recorded), Subcutaneous, Once, Doreatha Lew, MD    dexAMETHasone (DECADRON) 4 mg/mL injection 20 mg, 20 mg, Intravenous, Once PRN, Doreatha Lew, MD    diphenhydrAMINE (BENADRYL) injection 25 mg, 25 mg, Intravenous, Once PRN, Doreatha Lew, MD    DTaP-hepatitis B recombinant-IPV (PEDIARIX) 10 mcg-25Lf-25 mcg-10Lf/0.5 mL injection, , , ,     DTaP-hepatitis B recombinant-IPV (PEDIARIX) 10 mcg-25Lf-25 mcg-10Lf/0.5 mL injection, , , ,     EPINEPHrine (EPIPEN) injection 0.3 mg, 0.3 mg, Intramuscular, Once PRN, Doreatha Lew, MD    famotidine (PF) (  PEPCID) injection 20 mg, 20 mg, Intravenous, Once PRN, Doreatha Lew, MD    haemophilus B polysac-tetanus toxoid (ActHIB) 10 mcg/0.5 mL injection, , , ,     influenza vaccine quad (FLUARIX, FLULAVAL, FLUZONE) (6 MOS & UP) 2020-21 ADS Med, , , ,     methylPREDNISolone sodium succinate (PF) (SOLU-Medrol) injection 125 mg, 125 mg, Intravenous, Once PRN, Doreatha Lew, MD    OKAY TO SEND MEDICATION/CHEMOTHERAPY TO OUTPATIENT UNIT, , Other, Once, Doreatha Lew, MD    ondansetron [facility]) injection 8 mg, 8 mg, Intravenous, Once PRN, Doreatha Lew, MD    pneumococcal conjugate (13-valent) (PREVNAR-13) 0.5 mL vaccine, , , ,     pneumococcal conjugate (13-valent) (PREVNAR-13) 0.5 mL vaccine, , , ,     sodium chloride (NS) 0.9 % infusion, 20 mL/hr, Intravenous, Continuous PRN, Doreatha Lew, MD    sodium chloride 0.9% (NS) bolus 1,000 mL, 1,000 mL, Intravenous, Once PRN, Doreatha Lew, MD    varicella-zoster [id number] (PF) [facility]) 50 mcg/0.5 mL injection, , , ,     Medical History:  Past Medical History:   Diagnosis Date    Benign prostatic hyperplasia with lower urinary tract symptoms 11/27/2018    BPH (benign prostatic hyperplasia)     Diarrhea     Fatigue     Fractures     left arm    Glucose intolerance     May 2019: HbA1c 6.2.    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 CMV negative 04/07/20.     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  Planning to build more apartments.       Family History:  Family History   Problem Relation Age of Onset    Dementia Father     No Known Problems Other        Objective:   Vitals:    07/09/23 0820   BP: 148/81   Pulse: 85   Resp: 17   Temp: 36.8 ??C (98.3 ??F)   SpO2: 97%       Physical Exam:  General: Resting, in no apparent distress  HEENT:  PERRL. No scleral icterus or conjunctival injection.   Heart:  RRR.  S1, S2.  No murmurs, gallops or rubs. Mild non-pitting edema noted BLE.   Lungs:  Breathing is unlabored, and patient is speaking full sentences with ease.  CTAB. No rales, ronchi or crackles.    Abdomen:  No distention or pain on palpation.  Bowel sounds are present.  No palpable masses.  Skin:  No rashes, petechiae or purpura. Grossly intact.  Musculoskeletal:  strength is equal bilaterally   Psychiatric:  Range of affect is appropriate.    Neurologic:  Alert and oriented x 4.  Steady gait with assistance of cane.    Test Results:  Lab on 07/09/2023   Component Date Value  Ref Range Status    ABO Grouping 07/09/2023 A POS   Final    LDH 07/09/2023 172  120 - 246 U/L Final    Uric Acid 07/09/2023 3.2 (L)  3.7 - 9.2 mg/dL Final    Magnesium 16/08/9603 1.7  1.6 - 2.6 mg/dL Final    Sodium 54/07/8118 144  135 - 145 mmol/L Final    Potassium 07/09/2023 4.1  3.4 - 4.8 mmol/L Final    Chloride 07/09/2023 108 (H)  98 - 107 mmol/L Final    CO2 07/09/2023 28.0  20.0 - 31.0 mmol/L Final    Anion Gap 07/09/2023 8  5 - 14 mmol/L Final    BUN 07/09/2023 16  9 - 23 mg/dL Final    Creatinine 14/78/2956 0.86  0.73 - 1.18 mg/dL Final    BUN/Creatinine Ratio 07/09/2023 19   Final    eGFR CKD-EPI (2021) Male 07/09/2023 90  >=60 mL/min/1.48m2 Final    eGFR calculated with CKD-EPI 2021 equation in accordance with [employer] and [employer] of Nephrology Task Force recommendations.    Glucose 07/09/2023 113  70 - 179 mg/dL Final    Calcium 21/30/8657 8.8  8.7 - 10.4 mg/dL Final    Albumin 84/69/6295 3.8  3.4 - 5.0 g/dL Final    Total Protein 07/09/2023 6.4  5.7 - 8.2 g/dL Final    Total Bilirubin 07/09/2023 0.8  0.3 - 1.2 mg/dL Final    AST 28/41/3244 30  <=34 U/L Final    ALT 07/09/2023 41  10 - 49 U/L Final    Alkaline Phosphatase 07/09/2023 81  46 - 116 U/L Final    WBC 07/09/2023 3.4 (L)  3.6 - 11.2 10*9/L Final RBC 07/09/2023 3.84 (L)  4.26 - 5.60 10*12/L Final    HGB 07/09/2023 13.2  12.9 - 16.5 g/dL Final    HCT 11/29/7251 38.4 (L)  39.0 - 48.0 % Final    MCV 07/09/2023 100.0 (H)  77.6 - 95.7 fL Final    MCH 07/09/2023 34.3 (H)  25.9 - 32.4 pg Final    MCHC 07/09/2023 34.3  32.0 - 36.0 g/dL Final    RDW 66/44/0347 15.4 (H)  12.2 - 15.2 % Final    MPV 07/09/2023 6.9  6.8 - 10.7 fL Final    Platelet 07/09/2023 216  150 - 450 10*9/L Final    Neutrophils % 07/09/2023 36.6  % Final    Lymphocytes % 07/09/2023 48.8  % Final    Monocytes % 07/09/2023 12.8  % Final    Eosinophils % 07/09/2023 0.0  % Final    Basophils % 07/09/2023 1.8  % Final    Absolute Neutrophils 07/09/2023 1.2 (L)  1.8 - 7.8 10*9/L Final    Absolute Lymphocytes 07/09/2023 1.7  1.1 - 3.6 10*9/L Final    Absolute Monocytes 07/09/2023 0.4  0.3 - 0.8 10*9/L Final    Absolute Eosinophils 07/09/2023 0.0  0.0 - 0.5 10*9/L Final    Absolute Basophils 07/09/2023 0.1  0.0 - 0.1 10*9/L Final

## 2023-07-09 NOTE — Unmapped (Signed)
Lab on 07/09/2023   Component Date Value Ref Range Status    ABO Grouping 07/09/2023 A POS   Final    Antibody Screen 07/09/2023 NEG   Final    LDH 07/09/2023 172  120 - 246 U/L Final    Uric Acid 07/09/2023 3.2 (L)  3.7 - 9.2 mg/dL Final    Magnesium 16/08/9603 1.7  1.6 - 2.6 mg/dL Final    Sodium 54/07/8118 144  135 - 145 mmol/L Final    Potassium 07/09/2023 4.1  3.4 - 4.8 mmol/L Final    Chloride 07/09/2023 108 (H)  98 - 107 mmol/L Final    CO2 07/09/2023 28.0  20.0 - 31.0 mmol/L Final    Anion Gap 07/09/2023 8  5 - 14 mmol/L Final    BUN 07/09/2023 16  9 - 23 mg/dL Final    Creatinine 14/78/2956 0.86  0.73 - 1.18 mg/dL Final    BUN/Creatinine Ratio 07/09/2023 19   Final    eGFR CKD-EPI (2021) Male 07/09/2023 90  >=60 mL/min/1.94m2 Final    eGFR calculated with CKD-EPI 2021 equation in accordance with [employer] and [employer] of Nephrology Task Force recommendations.    Glucose 07/09/2023 113  70 - 179 mg/dL Final    Calcium 21/30/8657 8.8  8.7 - 10.4 mg/dL Final    Albumin 84/69/6295 3.8  3.4 - 5.0 g/dL Final    Total Protein 07/09/2023 6.4  5.7 - 8.2 g/dL Final    Total Bilirubin 07/09/2023 0.8  0.3 - 1.2 mg/dL Final    AST 28/41/3244 30  <=34 U/L Final    ALT 07/09/2023 41  10 - 49 U/L Final    Alkaline Phosphatase 07/09/2023 81  46 - 116 U/L Final    WBC 07/09/2023 3.4 (L)  3.6 - 11.2 10*9/L Final    RBC 07/09/2023 3.84 (L)  4.26 - 5.60 10*12/L Final    HGB 07/09/2023 13.2  12.9 - 16.5 g/dL Final    HCT 11/29/7251 38.4 (L)  39.0 - 48.0 % Final    MCV 07/09/2023 100.0 (H)  77.6 - 95.7 fL Final    MCH 07/09/2023 34.3 (H)  25.9 - 32.4 pg Final    MCHC 07/09/2023 34.3  32.0 - 36.0 g/dL Final    RDW 66/44/0347 15.4 (H)  12.2 - 15.2 % Final    MPV 07/09/2023 6.9  6.8 - 10.7 fL Final    Platelet 07/09/2023 216  150 - 450 10*9/L Final    Neutrophils % 07/09/2023 36.6  % Final    Lymphocytes % 07/09/2023 48.8  % Final    Monocytes % 07/09/2023 12.8  % Final    Eosinophils % 07/09/2023 0.0 % Final    Basophils % 07/09/2023 1.8  % Final    Absolute Neutrophils 07/09/2023 1.2 (L)  1.8 - 7.8 10*9/L Final    Absolute Lymphocytes 07/09/2023 1.7  1.1 - 3.6 10*9/L Final    Absolute Monocytes 07/09/2023 0.4  0.3 - 0.8 10*9/L Final    Absolute Eosinophils 07/09/2023 0.0  0.0 - 0.5 10*9/L Final    Absolute Basophils 07/09/2023 0.1  0.0 - 0.1 10*9/L Final

## 2023-07-10 ENCOUNTER — Ambulatory Visit: Admit: 2023-07-10 | Discharge: 2023-07-11 | Payer: MEDICARE

## 2023-07-10 ENCOUNTER — Ambulatory Visit
Admit: 2023-07-10 | Payer: MEDICARE | Attending: Rehabilitative and Restorative Service Providers" | Primary: Rehabilitative and Restorative Service Providers"

## 2023-07-10 ENCOUNTER — Ambulatory Visit
Admit: 2023-07-10 | Discharge: 2023-08-03 | Payer: MEDICARE | Attending: Rehabilitative and Restorative Service Providers" | Primary: Rehabilitative and Restorative Service Providers"

## 2023-07-10 DIAGNOSIS — Z7682 Awaiting organ transplant status: Principal | ICD-10-CM

## 2023-07-10 MED ADMIN — azacitidine (VIDAZA) syringe: 75 mg/m2 | SUBCUTANEOUS | @ 21:00:00 | Stop: 2023-07-10

## 2023-07-10 MED ADMIN — ondansetron (ZOFRAN) tablet 8 mg: 8 mg | ORAL | @ 20:00:00 | Stop: 2023-07-10

## 2023-07-10 NOTE — Unmapped (Signed)
NEUROLOGICAL OUTPATIENT PHYSICAL THERAPY   DAILY NOTE    Patient Name: Timothy Porter  Date of Birth:10/20/1946  Date: 07/10/2023  Session Number:  10 (2/10 to reassessment)  Therapy Diagnosis:   Encounter Diagnoses   Name Primary?    Multiple myeloma, remission status unspecified (CMS-HCC) Yes    Physical deconditioning     Impaired functional mobility, balance, and endurance     Muscle weakness     Chronic left hip pain          Date of Injury/Onset: Aug 2020  Date of Evaluation: 06/05/2023  Referring Pracitioner: Lindwood Qua*  Certification Dates: 06/06/23-08/28/23      ASSESSMENT:  Reporting period: 06/05/23-06/29/2023  77 y.o. 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 Patient presents to this clinic to improve balance, strength, and endurance in preparation for a stem cell transplant.     Today's session focused on home exercise program development as well as functional strengthening. The patient was able to perform more reps of build-up lunges today, although he continues to require cues to correct knee alignment during the exercise.    The patient will continue to benefit from skilled Physical Therapy services for balance training, bilateral LE strengthening, endurance, gait training, postural re-training, and education.    Prior Objective Testing  Tests Eval (06/05/23) 06/06/23    5xSTS 20.3 sec, UE support - 15.3 sec, UE support   - 997 ft  With SPC 1000 ft with SPC   TUG and Cognitive TUG - Without AD:  TUG: 14.5 sec  Cognitive TUG:   15.4 sec TUG: 12.6 sec, no device  TUG Cognitive: 16.2, no device, no errors         Plan for next session: stair navigation (descending is primary concern), step/return with cane, squats to the floor    Problem List: decreased bilateral LE strength, impaired balance, impaired posture, deconditioning, and impaired functional mobility    Personal Factors/Comorbidities Present: Bilateral THA , HTN, multiple myeloma, R femoral lytic lesion, acute myeloid leukemia (AML)      Goals    Patient/Family Goals:  floor transfers, balance, preparing for the stem cell transplant    Short Term Goals:  In  12  visits:  Patient will be able to properly demonstrate current HEP independently x1 in clinic to build upon functional gains in PT.  ONGOING 06/29/2023  Patient will stand on foam with feet together and eyes closed for 30 sec with minimal sway in open gym space to demonstrate increased confidence in balance.  Patient will complete the Timed Up and Go test without an assistive device in < 13.5 seconds to minimize the risk of falls. MET 06/29/2023 12.6 sec, no device  Patient will navigate 8 stairs with unilateral handrail, mod I.   Patient will navigate 6 outdoor curb with assistive device.  Patient will perform floor transfer with external support with SBA.   Patient will stoop to pick up 10 objects off the ground with assistive device. MET 06/29/2023    Long Term Goals:  In 24  visits:  Patient will be able to properly demonstrate updated HEP independently x1 in clinic to build upon functional gains in PT.   Patient will complete 5xSTS without UE support in <15 seconds to demonstrate improved functional LE strength. PROGRESS 06/29/2023 (15.3 sec, UE support)  Patient will complete the Cognitive Timed Up and Go test  without an assistive device in < 17.5 seconds to minimize the risk of falls with increased cognitive demand.   Patient will navigate 16 stairs with unilateral handrail, mod I.   Patient will increase bilateral hip flexor strengthening to 4+/5. PROGRESS 06/29/2023 (4+/5 on L, 4/5 on R)  Patient will perform floor transfer without external support with supervision.  Patient will complete the in >1000 ft with SPC. MET 06/29/2023    PLAN/Recommendations: 3 x a week for 8 weeks from eval  Planned Interventions: Manual Therapy  Gait Training  Therapeutic Activites  Neuromuscular Education  Self-Care  Physical Performance Test  Therapeutic exercise  Balance training  Postural exercises/education  Education  Dry Needling as needed  PT DME/Equipment Recs: none    Home Exercise Program:  -Feet together, eyes open,  3 x hold 30 sec  -standing against wall with scapular retraction for posture and standing endurance, 3 x 1 min  -Mini squats with green band around knees, 3 x 10  - Bridge 3 x10  - side-lying hip abduction 3 x10  - banded seated marching 3 x10  - Standing with feet apart on foam pad, horizontal head turns 3 x5 turns bilat  - Build up lunge with aerobic step on 2 risers and Airex pad, plus bilat UE support, 2 x5-10  - supine posterior pelvic tilt with TrA contraction 2 x10  - supine march with TrA contraction 2 x10  - side-lying clamshell (or supine clamshell with band) 2 x10    SUBJECTIVE:  Pt states:   - He will get a bone marrow transplant in mid-September. Wonders what exercises he can do when he is in the hospital, other than walking. He already has elastic band exercises for arms/shoulders. He will be in the hospital for about 3 weeks.     Reason for Referral/History of Present Condition/Onset of injury/exacerbation:   77 y.o. 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 Referral for conditioning, mobility, BLE strengthening/stretching, balance, core strengthening. Create home exercise program. Patient diagnosed with multiple myeloma in August 2020. He was found to have progressive pancytopenia in Sept 2023 and BMbx was done. 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 He enrolled on BAML S12 arm (ven x 28 days) and began treatment on 11/22/2022. After 1 cycle of treatment he was found to be in complete remission without minimal residual disease. 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 Plans to defer any further multiple myeloma therapy until relapse. He is being considered for a RIC allo stem cell transplant.    Last Fall: June 2024 around pool, needed external support but was able to stand  Precautions: Cancer, Chemotherapy  Red Flags:  none    Prior Functional Status: Working and on feet all the time. Fatigued and then they found the leukemia.    Current Functional Status: Walk 20-minutes per day with cane, half hour watering plants, Tuesdays does laundry. Sometimes uses [employer] [facility] for stabilizing. UE and LE resistance bands daily. 7,000 steps/day and 2-3 errands per day.    Currently used Equipment: SPC (only when leaving the house). Has heated pool, treadmill, eliptical  Previous Treatment: PT in 2022-2023 for deconditioning due to multiple myeloma, PT in 2023 for L THA    Social History: Lives with spouse in [place] house with 0 STE, small threshold.  5 steps going into pool unilateral.  Caregiver availability, capability, willingness: Wife retired at home  Independent w/ ADLs?: Yes.   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   -Wife Lurena Joiner) is a retired     OBJECTIVE :  Pain  Current:  0/10 knee pain    Therex: 30 min for strength training    Pt instructed in exercises for HEP, that he can perform when hospitalized for transplant. Pt cued for technique, x10 each bilat:  - bridge  - supine posterior pelvic tilt with TrA contraction  - supine march with TrA contraction  - side-lying hip abduction  - side-lying clamshell (or supine clamshell with band)    Build up lunge with aerobic step on 2 risers and Airex pad, plus bilat UE support:   - set 1: x10 bilat   - set 2: x10 bilat  HEP review    The patient was provided with an updated written home exercise program, which was reviewed with the patient. The patient verbalized understanding of all exercises and instructions.      NM Re-Ed: 10 min for balance training    Walking with eyes open and eyes closed, with SBA-CGA and unilat UE support from 1/2 wall, progressively increasing number of steps with eyes closed (up to 1 step eyes open, 3 steps eyes closed)      Patient Education: Throughout evaluation patient educated regarding the following: Role of PT in Rehabilitation, HEP, and treatment plan. Patient demonstrated and verbalized agreement and understanding.        Total Time: 40 min   Treatment Rendered:    See above    I attest that I have reviewed the above information.  Signed: Eulas Post, PT  07/10/2023 10:41 AM

## 2023-07-10 NOTE — Unmapped (Signed)
Pt in triage, here for injection     Tolerated injection left abd     AVS declined   Ambulatory for dc home

## 2023-07-10 NOTE — Unmapped (Signed)
No visits with results within 1 Day(s) from this visit.   Latest known visit with results is:   Lab on 07/09/2023   Component Date Value Ref Range Status    ABO Grouping 07/09/2023 A POS   Final    Antibody Screen 07/09/2023 NEG   Final    LDH 07/09/2023 172  120 - 246 U/L Final    Uric Acid 07/09/2023 3.2 (L)  3.7 - 9.2 mg/dL Final    Magnesium 40/08/2724 1.7  1.6 - 2.6 mg/dL Final    Sodium 36/64/4034 144  135 - 145 mmol/L Final    Potassium 07/09/2023 4.1  3.4 - 4.8 mmol/L Final    Chloride 07/09/2023 108 (H)  98 - 107 mmol/L Final    CO2 07/09/2023 28.0  20.0 - 31.0 mmol/L Final    Anion Gap 07/09/2023 8  5 - 14 mmol/L Final    BUN 07/09/2023 16  9 - 23 mg/dL Final    Creatinine 74/25/9563 0.86  0.73 - 1.18 mg/dL Final    BUN/Creatinine Ratio 07/09/2023 19   Final    eGFR CKD-EPI (2021) Male 07/09/2023 90  >=60 mL/min/1.28m2 Final    eGFR calculated with CKD-EPI 2021 equation in accordance with [employer] and [employer] of Nephrology Task Force recommendations.    Glucose 07/09/2023 113  70 - 179 mg/dL Final    Calcium 87/56/4332 8.8  8.7 - 10.4 mg/dL Final    Albumin 95/18/8416 3.8  3.4 - 5.0 g/dL Final    Total Protein 07/09/2023 6.4  5.7 - 8.2 g/dL Final    Total Bilirubin 07/09/2023 0.8  0.3 - 1.2 mg/dL Final    AST 60/63/0160 30  <=34 U/L Final    ALT 07/09/2023 41  10 - 49 U/L Final    Alkaline Phosphatase 07/09/2023 81  46 - 116 U/L Final    WBC 07/09/2023 3.4 (L)  3.6 - 11.2 10*9/L Final    RBC 07/09/2023 3.84 (L)  4.26 - 5.60 10*12/L Final    HGB 07/09/2023 13.2  12.9 - 16.5 g/dL Final    HCT 10/93/2355 38.4 (L)  39.0 - 48.0 % Final    MCV 07/09/2023 100.0 (H)  77.6 - 95.7 fL Final    MCH 07/09/2023 34.3 (H)  25.9 - 32.4 pg Final    MCHC 07/09/2023 34.3  32.0 - 36.0 g/dL Final    RDW 73/22/0254 15.4 (H)  12.2 - 15.2 % Final    MPV 07/09/2023 6.9  6.8 - 10.7 fL Final    Platelet 07/09/2023 216  150 - 450 10*9/L Final    Neutrophils % 07/09/2023 36.6  % Final    Lymphocytes % 07/09/2023 48.8  % Final    Monocytes % 07/09/2023 12.8  % Final    Eosinophils % 07/09/2023 0.0  % Final    Basophils % 07/09/2023 1.8  % Final    Absolute Neutrophils 07/09/2023 1.2 (L)  1.8 - 7.8 10*9/L Final    Absolute Lymphocytes 07/09/2023 1.7  1.1 - 3.6 10*9/L Final    Absolute Monocytes 07/09/2023 0.4  0.3 - 0.8 10*9/L Final    Absolute Eosinophils 07/09/2023 0.0  0.0 - 0.5 10*9/L Final    Absolute Basophils 07/09/2023 0.1  0.0 - 0.1 10*9/L Final

## 2023-07-11 ENCOUNTER — Ambulatory Visit: Admit: 2023-07-11 | Discharge: 2023-07-12 | Payer: MEDICARE

## 2023-07-11 MED ADMIN — azacitidine (VIDAZA) syringe: 75 mg/m2 | SUBCUTANEOUS | @ 21:00:00 | Stop: 2023-07-11

## 2023-07-11 MED ADMIN — ondansetron (ZOFRAN) tablet 8 mg: 8 mg | ORAL | @ 20:00:00 | Stop: 2023-07-11

## 2023-07-11 NOTE — Unmapped (Signed)
No visits with results within 1 Day(s) from this visit.   Latest known visit with results is:   Lab on 07/09/2023   Component Date Value Ref Range Status    ABO Grouping 07/09/2023 A POS   Final    Antibody Screen 07/09/2023 NEG   Final    LDH 07/09/2023 172  120 - 246 U/L Final    Uric Acid 07/09/2023 3.2 (L)  3.7 - 9.2 mg/dL Final    Magnesium 40/08/2724 1.7  1.6 - 2.6 mg/dL Final    Sodium 36/64/4034 144  135 - 145 mmol/L Final    Potassium 07/09/2023 4.1  3.4 - 4.8 mmol/L Final    Chloride 07/09/2023 108 (H)  98 - 107 mmol/L Final    CO2 07/09/2023 28.0  20.0 - 31.0 mmol/L Final    Anion Gap 07/09/2023 8  5 - 14 mmol/L Final    BUN 07/09/2023 16  9 - 23 mg/dL Final    Creatinine 74/25/9563 0.86  0.73 - 1.18 mg/dL Final    BUN/Creatinine Ratio 07/09/2023 19   Final    eGFR CKD-EPI (2021) Male 07/09/2023 90  >=60 mL/min/1.28m2 Final    eGFR calculated with CKD-EPI 2021 equation in accordance with [employer] and [employer] of Nephrology Task Force recommendations.    Glucose 07/09/2023 113  70 - 179 mg/dL Final    Calcium 87/56/4332 8.8  8.7 - 10.4 mg/dL Final    Albumin 95/18/8416 3.8  3.4 - 5.0 g/dL Final    Total Protein 07/09/2023 6.4  5.7 - 8.2 g/dL Final    Total Bilirubin 07/09/2023 0.8  0.3 - 1.2 mg/dL Final    AST 60/63/0160 30  <=34 U/L Final    ALT 07/09/2023 41  10 - 49 U/L Final    Alkaline Phosphatase 07/09/2023 81  46 - 116 U/L Final    WBC 07/09/2023 3.4 (L)  3.6 - 11.2 10*9/L Final    RBC 07/09/2023 3.84 (L)  4.26 - 5.60 10*12/L Final    HGB 07/09/2023 13.2  12.9 - 16.5 g/dL Final    HCT 10/93/2355 38.4 (L)  39.0 - 48.0 % Final    MCV 07/09/2023 100.0 (H)  77.6 - 95.7 fL Final    MCH 07/09/2023 34.3 (H)  25.9 - 32.4 pg Final    MCHC 07/09/2023 34.3  32.0 - 36.0 g/dL Final    RDW 73/22/0254 15.4 (H)  12.2 - 15.2 % Final    MPV 07/09/2023 6.9  6.8 - 10.7 fL Final    Platelet 07/09/2023 216  150 - 450 10*9/L Final    Neutrophils % 07/09/2023 36.6  % Final    Lymphocytes % 07/09/2023 48.8  % Final    Monocytes % 07/09/2023 12.8  % Final    Eosinophils % 07/09/2023 0.0  % Final    Basophils % 07/09/2023 1.8  % Final    Absolute Neutrophils 07/09/2023 1.2 (L)  1.8 - 7.8 10*9/L Final    Absolute Lymphocytes 07/09/2023 1.7  1.1 - 3.6 10*9/L Final    Absolute Monocytes 07/09/2023 0.4  0.3 - 0.8 10*9/L Final    Absolute Eosinophils 07/09/2023 0.0  0.0 - 0.5 10*9/L Final    Absolute Basophils 07/09/2023 0.1  0.0 - 0.1 10*9/L Final

## 2023-07-11 NOTE — Unmapped (Signed)
Patient arrived to chair 13 for C8 D3 of azacitadine injection. Treatment perimeters reviewed and premedicated. Tolerated treatment well, AVS declined and discharged home.

## 2023-07-11 NOTE — Unmapped (Signed)
Per patient: Current cycle starting 07/09/23 will be his last one prior to BMT.  He only needs an additional #23 tablets to complete the 21 day course.    [facility] Clinical Assessment & Refill Coordination Note    Timothy Porter, DOB: 1946/06/01  Phone: [phone] (home)     All above HIPAA information was verified with patient.     Was a Nurse, learning disability used for this call? No    Specialty Medication(s):   Hematology/Oncology: Venclexta     Current Outpatient Medications   Medication Sig Dispense Refill    calcium carbonate (TUMS) 200 mg calcium (500 mg) chewable tablet Chew 2 tablets (400 mg of elem calcium total) Three (3) times a day.      cholecalciferol, vitamin D3 25 mcg, 1,000 units,, 1,000 unit (25 mcg) tablet Take 1 tablet (25 mcg total) by mouth.      cholestyramine (QUESTRAN) 4 gram packet Take 1 packet by mouth in the morning and 1 packet in the evening. Take with meals. Mix 4 g (1 packet) into 2-3 ounces of fluid and take twice a day.. 60 packet 5    cyanocobalamin, vitamin B-12, 2,000 mcg Tab Take 2,000 mcg by mouth in the morning. 90 tablet 3    docusate sodium (COLACE) 50 MG capsule Take 1 capsule (50 mg total) by mouth two (2) times a day.      loratadine (CLARITIN) 10 mg tablet Take 1 tablet (10 mg total) by mouth daily.      metoPROLOL succinate (TOPROL-XL) 50 MG 24 hr tablet Take 1 tablet (50 mg total) by mouth daily. 30 tablet 11    multivitamin with minerals tablet Take 1 tablet by mouth daily.      omeprazole (PRILOSEC) 40 MG capsule TAKE 1 CAPSULE(40 MG) BY MOUTH DAILY 90 capsule 0    ondansetron (ZOFRAN) 8 MG tablet Take 1 tablet (8 mg total) by mouth every eight (8) hours as needed for nausea. 30 tablet 2    sildenafiL, pulm.hypertension, (REVATIO) 20 mg tablet Take 1-5 tablets (20-100 mg total) by mouth daily as needed (for erectile function). 90 tablet 3    tamsulosin (FLOMAX) 0.4 mg capsule Take 1 capsule (0.4 mg total) by mouth daily. 90 capsule 0 valACYclovir (VALTREX) 500 MG tablet Take 1 tablet (500 mg total) by mouth daily. 90 tablet 3    venetoclax (VENCLEXTA) 100 mg tablet Take 4 tablets (400 mg total) by mouth daily for 21 days. 84 tablet 4     No current facility-administered medications for this visit.     Facility-Administered Medications Ordered in Other Visits   Medication Dose Route Frequency Provider Last Rate Last Admin    DTaP-hepatitis B recombinant-IPV (PEDIARIX) 10 mcg-25Lf-25 mcg-10Lf/0.5 mL injection             DTaP-hepatitis B recombinant-IPV (PEDIARIX) 10 mcg-25Lf-25 mcg-10Lf/0.5 mL injection             haemophilus B polysac-tetanus toxoid (ActHIB) 10 mcg/0.5 mL injection             influenza vaccine quad (FLUARIX, FLULAVAL, FLUZONE) (6 MOS & UP) 2020-21 ADS Med             pneumococcal conjugate (13-valent) (PREVNAR-13) 0.5 mL vaccine             pneumococcal conjugate (13-valent) (PREVNAR-13) 0.5 mL vaccine             varicella-zoster gE-AS01B (PF) (SHINGRIX) 50 mcg/0.5 mL injection  Changes to medications: Timothy Porter reports no changes at this time.    No Known Allergies    Changes to allergies: No    SPECIALTY MEDICATION ADHERENCE     Venetoclax 100 mg: 53 tablets of medicine on hand     Are there any concerns with adherence? No    Adherence counseling provided? Not needed    Patient-Reported Symptoms Tracker for Cancer Patients on Oral Chemotherapy     Oral chemotherapy medication name(s): venetoclax  Dose and frequency: 400 mg once daily x 21 days  Oral Chemotherapy Start Date: 07/09/2023  Baseline? No  Clinic(s) visited: Hematology    Symptom Grouping Question Patient Response   Digestion and Eating Have you felt sick to your stomach? Denies    Had diarrhea? Denies    Constipated? Denies    Not wanting to eat? Denies    Comments      Sleep and Pain Felt very tired even after you rest? Denies    Pain due to cancer medication or cancer? Denies    Comments     Other Side Effects Numbness or tingling in hands and/or feet? Denies    Felt short of breath? Denies    Mouth or throat Sores? Denies    Rash? Denies    Palmar-plantar erythrodysesthesia syndrome?      Rash - acneiform?      Rash - maculo-papular?      How many days over the past month did your cancer medication or cancer keep you from your normal activities?  Write in number of days, 0-30:  0    Other side effects or things you would like to discuss?      Comments?     Adherence  In the last 30 days, on how many days did you miss at least one dose of any of your [drug name]? Write in number of days, 0-30:  0    What reasons are you having trouble taking your medication [pharmacist: check all that apply]? Specify chemotherapy cycle:        No problems identified    Comments:        Comments       Optional Symptom Tracking Comments: Patient requested refill for  #23 tablets to finish current/last cycle before bone marrow transplant    CLINICAL MANAGEMENT AND INTERVENTION      Clinical Benefit Assessment:    Do you feel the medicine is effective or helping your condition? Yes    Clinical Benefit counseling provided? Not needed    Acute Infection Status:    Acute infections noted within Epic:  No active infections    Patient reported infection: None    Therapy Appropriateness:    Is therapy appropriate based on current medication list, adverse reactions, adherence, clinical benefit and progress toward achieving therapeutic goals?  Yes, therapy is appropriate and should be continued    DISEASE/MEDICATION-SPECIFIC INFORMATION      N/A    Is the patient receiving adequate infection prevention treatment? Not applicable    Does the patient have adequate nutritional support? Not applicable    PATIENT SPECIFIC NEEDS     Does the patient have any physical, cognitive, or cultural barriers? No    Is the patient high risk? Yes, patient is taking oral chemotherapy. Appropriateness of therapy as been assessed    Did the patient require a clinical intervention? No    Does the patient require physician intervention or other additional services (i.e., nutrition, smoking cessation, social  work)? No    SOCIAL DETERMINANTS OF HEALTH     At the [facility], we have learned that life circumstances - like trouble affording food, housing, utilities, or transportation can affect the health of many of our patients.   That is why we wanted to ask: are you currently experiencing any life circumstances that are negatively impacting your health and/or quality of life? Patient declined to answer    Social Determinants of Health     Financial Resource Strain: Low Risk  (11/02/2022)    Overall Financial Resource Strain (CARDIA)     Difficulty of Paying Living Expenses: Not hard at all   Internet Connectivity: No Internet connectivity concern identified (11/02/2022)    Internet Connectivity     Do you have access to internet services: Yes     How do you connect to the internet: Personal Device at home     Is your internet connection strong enough for you to watch video on your device without major problems?: Yes     Do you have enough data to get through the month?: Yes     Does at least one of the devices have a camera that you can use for video chat?: Yes   Food Insecurity: No Food Insecurity (03/04/2021)    Hunger Vital Sign     Worried About Running Out of Food in the Last Year: Never true     Ran Out of Food in the Last Year: Never true   Tobacco Use: Low Risk  (07/10/2023)    Patient History     Smoking Tobacco Use: Never     Smokeless Tobacco Use: Never     Passive Exposure: Not on file   Housing/Utilities: Low Risk  (11/02/2022)    Housing/Utilities     Within the past 12 months, have you ever stayed: outside, in a car, in a tent, in an overnight shelter, or temporarily in someone else's home (i.e. couch-surfing)?: No     Are you worried about losing your housing?: No     Within the past 12 months, have you been unable to get utilities (heat, electricity) when it was really needed?: No   Alcohol Use: Not At Risk (10/17/2019)    Alcohol Use     How often do you have a drink containing alcohol?: Monthly or less     How many drinks containing alcohol do you have on a typical day when you are drinking?: 1 - 2     How often do you have 5 or more drinks on one occasion?: Never   Transportation Needs: No Transportation Needs (09/03/2020)    PRAPARE - Transportation     Lack of Transportation (Medical): No     Lack of Transportation (Non-Medical): No   Substance Use: Not on file   Health Literacy: Low Risk  (03/04/2021)    Health Literacy     : Never   Physical Activity: Insufficiently Active (05/28/2019)    Exercise Vital Sign     Days of Exercise per Week: 7 days     Minutes of Exercise per Session: 20 min   Interpersonal Safety: Unknown (07/11/2023)    Interpersonal Safety     Unsafe Where You Currently Live: Not on file     Physically Hurt by Anyone: Not on file     Abused by Anyone: Not on file   Stress: No Stress Concern Present (05/28/2019)    [employer] of Occupational Health - Occupational Stress Questionnaire  Feeling of Stress : Not at all   Intimate Partner Violence: Not At Risk (03/04/2021)    Humiliation, Afraid, Rape, and Kick questionnaire     Fear of Current or Ex-Partner: No     Emotionally Abused: No     Physically Abused: No     Sexually Abused: No   Depression: Not at risk (05/03/2023)    PHQ-2     PHQ-2 Score: 0   Social Connections: Unknown (05/28/2019)    Social Connection and Isolation Panel [NHANES]     Frequency of Communication with Friends and Family: More than three times a week     Frequency of Social Gatherings with Friends and Family: Twice a week     Attends Religious Services: Patient declined     Database administrator or Organizations: Yes     Attends Banker Meetings: Not on file     Marital Status: Married       Would you be willing to receive help with any of the needs that you have identified today? Not applicable       SHIPPING     Specialty Medication(s) to be Shipped: Hematology/Oncology: Venclexta    Other medication(s) to be shipped: No additional medications requested for fill at this time     Changes to insurance: No    Delivery Scheduled: Yes, Expected medication delivery date: 07/18/23.     Medication will be delivered via Next Day Courier to the confirmed prescription address in [facility].    The patient will receive a drug information handout for each medication shipped and additional FDA Medication Guides as required.  Verified that patient has previously received a Conservation officer, historic buildings and a Surveyor, mining.    The patient or caregiver noted above participated in the development of this care plan and knows that they can request review of or adjustments to the care plan at any time.      All of the patient's questions and concerns have been addressed.    [name], [facility]   [facility] Specialty Pharmacist

## 2023-07-12 ENCOUNTER — Ambulatory Visit: Admit: 2023-07-12 | Discharge: 2023-07-13 | Payer: MEDICARE

## 2023-07-12 LAB — CBC W/ AUTO DIFF
BASOPHILS ABSOLUTE COUNT: 0 10*9/L (ref 0.0–0.1)
BASOPHILS RELATIVE PERCENT: 0.5 %
EOSINOPHILS ABSOLUTE COUNT: 0 10*9/L (ref 0.0–0.5)
EOSINOPHILS RELATIVE PERCENT: 0 %
HEMATOCRIT: 34.8 % — ABNORMAL LOW (ref 39.0–48.0)
HEMOGLOBIN: 11.9 g/dL — ABNORMAL LOW (ref 12.9–16.5)
LYMPHOCYTES ABSOLUTE COUNT: 1.2 10*9/L (ref 1.1–3.6)
LYMPHOCYTES RELATIVE PERCENT: 43.5 %
MEAN CORPUSCULAR HEMOGLOBIN CONC: 34.2 g/dL (ref 32.0–36.0)
MEAN CORPUSCULAR HEMOGLOBIN: 34.6 pg — ABNORMAL HIGH (ref 25.9–32.4)
MEAN CORPUSCULAR VOLUME: 101 fL — ABNORMAL HIGH (ref 77.6–95.7)
MEAN PLATELET VOLUME: 7.1 fL (ref 6.8–10.7)
MONOCYTES ABSOLUTE COUNT: 0.6 10*9/L (ref 0.3–0.8)
MONOCYTES RELATIVE PERCENT: 21.5 %
NEUTROPHILS ABSOLUTE COUNT: 1 10*9/L — ABNORMAL LOW (ref 1.8–7.8)
NEUTROPHILS RELATIVE PERCENT: 34.5 %
PLATELET COUNT: 158 10*9/L (ref 150–450)
RED BLOOD CELL COUNT: 3.44 10*12/L — ABNORMAL LOW (ref 4.26–5.60)
RED CELL DISTRIBUTION WIDTH: 15.6 % — ABNORMAL HIGH (ref 12.2–15.2)
WBC ADJUSTED: 2.8 10*9/L — ABNORMAL LOW (ref 3.6–11.2)

## 2023-07-12 LAB — COMPREHENSIVE METABOLIC PANEL
ALBUMIN: 3.5 g/dL (ref 3.4–5.0)
ALKALINE PHOSPHATASE: 77 U/L (ref 46–116)
ALT (SGPT): 36 U/L (ref 10–49)
ANION GAP: 4 mmol/L — ABNORMAL LOW (ref 5–14)
AST (SGOT): 32 U/L (ref <=34)
BILIRUBIN TOTAL: 0.4 mg/dL (ref 0.3–1.2)
BLOOD UREA NITROGEN: 24 mg/dL — ABNORMAL HIGH (ref 9–23)
BUN / CREAT RATIO: 24
CALCIUM: 8.9 mg/dL (ref 8.7–10.4)
CHLORIDE: 106 mmol/L (ref 98–107)
CO2: 26 mmol/L (ref 20.0–31.0)
CREATININE: 1.02 mg/dL
EGFR CKD-EPI (2021) MALE: 76 mL/min/{1.73_m2} (ref >=60)
GLUCOSE RANDOM: 155 mg/dL (ref 70–179)
POTASSIUM: 4 mmol/L (ref 3.4–4.8)
PROTEIN TOTAL: 6.4 g/dL (ref 5.7–8.2)
SODIUM: 136 mmol/L (ref 135–145)

## 2023-07-12 MED ADMIN — azacitidine (VIDAZA) syringe: 75 mg/m2 | SUBCUTANEOUS | @ 21:00:00 | Stop: 2023-07-12

## 2023-07-12 MED ADMIN — ondansetron (ZOFRAN) tablet 8 mg: 8 mg | ORAL | @ 21:00:00 | Stop: 2023-07-12

## 2023-07-12 NOTE — Unmapped (Signed)
1614: Patient arrived for treatment.

## 2023-07-13 ENCOUNTER — Ambulatory Visit: Admit: 2023-07-13 | Discharge: 2023-07-14 | Payer: MEDICARE

## 2023-07-13 MED ADMIN — ondansetron (ZOFRAN) tablet 8 mg: 8 mg | ORAL | @ 20:00:00 | Stop: 2023-07-13

## 2023-07-13 MED ADMIN — azacitidine (VIDAZA) syringe: 75 mg/m2 | SUBCUTANEOUS | @ 21:00:00 | Stop: 2023-07-13

## 2023-07-13 NOTE — Unmapped (Signed)
No visits with results within 1 Day(s) from this visit.   Latest known visit with results is:   Hospital Outpatient Visit on 07/12/2023   Component Date Value Ref Range Status    Sodium 07/12/2023 136  135 - 145 mmol/L Final    Potassium 07/12/2023 4.0  3.4 - 4.8 mmol/L Final    Chloride 07/12/2023 106  98 - 107 mmol/L Final    CO2 07/12/2023 26.0  20.0 - 31.0 mmol/L Final    Anion Gap 07/12/2023 4 (L)  5 - 14 mmol/L Final    BUN 07/12/2023 24 (H)  9 - 23 mg/dL Final    Creatinine 57/84/6962 1.02  0.73 - 1.18 mg/dL Final    BUN/Creatinine Ratio 07/12/2023 24   Final    eGFR CKD-EPI (2021) Male 07/12/2023 76  >=60 mL/min/1.65m2 Final    eGFR calculated with CKD-EPI 2021 equation in accordance with [employer] and [employer] of Nephrology Task Force recommendations.    Glucose 07/12/2023 155  70 - 179 mg/dL Final    Calcium 95/28/4132 8.9  8.7 - 10.4 mg/dL Final    Albumin 44/11/270 3.5  3.4 - 5.0 g/dL Final    Total Protein 07/12/2023 6.4  5.7 - 8.2 g/dL Final    Total Bilirubin 07/12/2023 0.4  0.3 - 1.2 mg/dL Final    AST 53/66/4403 32  <=34 U/L Final    ALT 07/12/2023 36  10 - 49 U/L Final    Alkaline Phosphatase 07/12/2023 77  46 - 116 U/L Final    WBC 07/12/2023 2.8 (L)  3.6 - 11.2 10*9/L Final    RBC 07/12/2023 3.44 (L)  4.26 - 5.60 10*12/L Final    HGB 07/12/2023 11.9 (L)  12.9 - 16.5 g/dL Final    HCT 47/42/5956 34.8 (L)  39.0 - 48.0 % Final    MCV 07/12/2023 101.0 (H)  77.6 - 95.7 fL Final    MCH 07/12/2023 34.6 (H)  25.9 - 32.4 pg Final    MCHC 07/12/2023 34.2  32.0 - 36.0 g/dL Final    RDW 38/75/6433 15.6 (H)  12.2 - 15.2 % Final    MPV 07/12/2023 7.1  6.8 - 10.7 fL Final    Platelet 07/12/2023 158  150 - 450 10*9/L Final    Neutrophils % 07/12/2023 34.5  % Final    Lymphocytes % 07/12/2023 43.5  % Final    Monocytes % 07/12/2023 21.5  % Final    Eosinophils % 07/12/2023 0.0  % Final    Basophils % 07/12/2023 0.5  % Final    Absolute Neutrophils 07/12/2023 1.0 (L)  1.8 - 7.8 10*9/L Final    Absolute Lymphocytes 07/12/2023 1.2  1.1 - 3.6 10*9/L Final    Absolute Monocytes 07/12/2023 0.6  0.3 - 0.8 10*9/L Final    Absolute Eosinophils 07/12/2023 0.0  0.0 - 0.5 10*9/L Final    Absolute Basophils 07/12/2023 0.0  0.0 - 0.1 10*9/L Final    Macrocytosis 07/12/2023 Slight (A)  Not Present Final

## 2023-07-13 NOTE — Unmapped (Signed)
1715: Pt tolerated treatment/injection W/O difficulty.   Pt left infusion center ambulatory. NAD, no questions nor complaints voiced at D/C. Pt aware of follow up.

## 2023-07-13 NOTE — Unmapped (Signed)
Pt in bed 10 came in for his scheduled chemo injections which were tolerated well.    Labs were reviewed; pre meds were given.    AVS was declined; went home ambulatory.

## 2023-07-17 MED FILL — VENCLEXTA 100 MG TABLET: ORAL | 7 days supply | Qty: 28 | Fill #3

## 2023-07-19 NOTE — Unmapped (Signed)
NEUROLOGICAL OUTPATIENT PHYSICAL THERAPY   DAILY NOTE    Patient Name: Timothy Porter  Date of Birth:[birth year]  Date: 07/19/2023  Session Number:  11 (3/10 to reassessment)  Therapy Diagnosis:   Encounter Diagnoses   Name Primary?    Multiple myeloma, remission status unspecified (CMS-HCC) Yes    Physical deconditioning     Impaired functional mobility, balance, and endurance     Muscle weakness          Date of Injury/Onset: Aug 2020  Date of Evaluation: 06/05/2023  Referring Pracitioner: Lindwood Qua*  Certification Dates: 06/06/23-08/28/23      ASSESSMENT:  Reporting period: 06/05/23-06/29/2023  77 y.o. 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 Patient presents to this clinic to improve balance, strength, and endurance in preparation for a stem cell transplant.     Today's session focused on standing strengthening exercises. The patient was more fatigued today after getting chemo injections for the last week. He required more frequent and longer seated breaks between exercises. Despite this, he was motivated to complete a full session.    The patient will continue to benefit from skilled Physical Therapy services for balance training, bilateral LE strengthening, endurance, gait training, postural re-training, and education.    Prior Objective Testing  Tests Eval (06/05/23) 06/06/23    5xSTS 20.3 sec, UE support - 15.3 sec, UE support   - 997 ft  With SPC 1000 ft with SPC   TUG and Cognitive TUG - Without AD:  TUG: 14.5 sec  Cognitive TUG:   15.4 sec TUG: 12.6 sec, no device  TUG Cognitive: 16.2, no device, no errors         Plan for next session: stair navigation (descending is primary concern), step/return with cane, squats to the floor    Problem List: decreased bilateral LE strength, impaired balance, impaired posture, deconditioning, and impaired functional mobility    Personal Factors/Comorbidities Present: Bilateral THA , HTN, multiple myeloma, R femoral lytic lesion, acute myeloid leukemia (AML)      Goals    Patient/Family Goals:  floor transfers, balance, preparing for the stem cell transplant    Short Term Goals:  In  12  visits:  Patient will be able to properly demonstrate current HEP independently x1 in clinic to build upon functional gains in PT.  ONGOING 06/29/2023  Patient will stand on foam with feet together and eyes closed for 30 sec with minimal sway in open gym space to demonstrate increased confidence in balance.  Patient will complete the Timed Up and Go test without an assistive device in < 13.5 seconds to minimize the risk of falls. MET 06/29/2023 12.6 sec, no device  Patient will navigate 8 stairs with unilateral handrail, mod I.   Patient will navigate 6 outdoor curb with assistive device.  Patient will perform floor transfer with external support with SBA.   Patient will stoop to pick up 10 objects off the ground with assistive device. MET 06/29/2023    Long Term Goals:  In 24  visits:  Patient will be able to properly demonstrate updated HEP independently x1 in clinic to build upon functional gains in PT.   Patient will complete 5xSTS without UE support in <15 seconds to demonstrate improved functional LE strength. PROGRESS 06/29/2023 (15.3 sec, UE support)  Patient will complete the Cognitive Timed Up and Go test without an assistive device in <  17.5 seconds to minimize the risk of falls with increased cognitive demand.   Patient will navigate 16 stairs with unilateral handrail, mod I.   Patient will increase bilateral hip flexor strengthening to 4+/5. PROGRESS 06/29/2023 (4+/5 on L, 4/5 on R)  Patient will perform floor transfer without external support with supervision.  Patient will complete the in >1000 ft with SPC. MET 06/29/2023    PLAN/Recommendations: 3 x a week for 8 weeks from eval  Planned Interventions: Manual Therapy  Gait Training  Therapeutic Activites  Neuromuscular Education  Self-Care  Physical Performance Test  Therapeutic exercise  Balance training  Postural exercises/education  Education  Dry Needling as needed  PT DME/Equipment Recs: none    Home Exercise Program:  -Feet together, eyes open,  3 x hold 30 sec  -standing against wall with scapular retraction for posture and standing endurance, 3 x 1 min  -Mini squats with green band around knees, 3 x 10  - Bridge 3 x10  - side-lying hip abduction 3 x10  - banded seated marching 3 x10  - Standing with feet apart on foam pad, horizontal head turns 3 x5 turns bilat  - Build up lunge with aerobic step on 2 risers and Airex pad, plus bilat UE support, 2 x5-10  - supine posterior pelvic tilt with TrA contraction 2 x10  - supine march with TrA contraction 2 x10  - side-lying clamshell (or supine clamshell with band) 2 x10    SUBJECTIVE:  Pt states:   - He has been more tired because he got shots all last week. He has been working on cleaning things out.   - He tried the lunges at home, and they went pretty well.   - The plan is still to get a bone marrow transplant in mid-September.     Reason for Referral/History of Present Condition/Onset of injury/exacerbation:   77 y.o. 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 Referral for conditioning, mobility, BLE strengthening/stretching, balance, core strengthening. Create home exercise program. Patient diagnosed with multiple myeloma in August 2020. He was found to have progressive pancytopenia in Sept 2023 and BMbx was done. 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 He enrolled on BAML S12 arm (ven x 28 days) and began treatment on 11/22/2022. After 1 cycle of treatment he was found to be in complete remission without minimal residual disease. 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 Plans to defer any further multiple myeloma therapy until relapse. He is being considered for a RIC allo stem cell transplant.    Last Fall: June 2024 around pool, needed external support but was able to stand  Precautions: Cancer, Chemotherapy  Red Flags:  none    Prior Functional Status: Working and on feet all the time. Fatigued and then they found the leukemia.    Current Functional Status: Walk 20-minutes per day with cane, half hour watering plants, Tuesdays does laundry. Sometimes uses [employer] for stabilizing. UE and LE resistance bands daily. 7,000 steps/day and 2-3 errands per day.    Currently used Equipment: SPC (only when leaving the house). Has heated pool, treadmill, eliptical  Previous Treatment: PT in 2022-2023 for deconditioning due to multiple myeloma, PT in 2023 for L THA    Social History: Lives with spouse in [place] house with 0 STE, small  threshold. 5 steps going into pool unilateral.  Caregiver availability, capability, willingness: Wife retired at home  Independent w/ ADLs?: Yes.   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   -Wife Lurena Joiner) is a retired     OBJECTIVE :  Pain  Current:  0/10 knee pain    Therex: 38 min for strength training    Step-return over cane on ground (focus on hip flexion), with UE support from half wall as needed, plus CGA-SBA:   - forward 2 x20   - lateral 2 x20    Build up lunge with aerobic step on 2 risers and Airex pad, plus bilat UE support:   - set 1: x10 bilat   - set 2: x9 bilat    Pt required seated breaks between all sets today due to fatigue.     Verbally reviewed HEP.      Patient Education: Throughout evaluation patient educated regarding the following: Role of PT in Rehabilitation, HEP, and treatment plan. Patient demonstrated and verbalized agreement and understanding.        Total Time: 38 min   Treatment Rendered:    See above    I attest that I have reviewed the above information.  Signed: Eulas Post, PT  07/19/2023 9:37 AM

## 2023-07-26 DIAGNOSIS — Z7682 Awaiting organ transplant status: Principal | ICD-10-CM

## 2023-07-31 DIAGNOSIS — C9200 Acute myeloblastic leukemia, not having achieved remission: Principal | ICD-10-CM

## 2023-08-02 ENCOUNTER — Ambulatory Visit: Admit: 2023-08-02 | Discharge: 2023-08-03 | Payer: MEDICARE

## 2023-08-02 ENCOUNTER — Other Ambulatory Visit: Admit: 2023-08-02 | Discharge: 2023-08-03 | Payer: MEDICARE

## 2023-08-02 ENCOUNTER — Institutional Professional Consult (permissible substitution): Admit: 2023-08-02 | Discharge: 2023-08-03 | Payer: MEDICARE

## 2023-08-02 ENCOUNTER — Encounter: Admit: 2023-08-02 | Discharge: 2023-08-03 | Payer: MEDICARE | Attending: Internal Medicine | Primary: Internal Medicine

## 2023-08-02 ENCOUNTER — Ambulatory Visit
Admit: 2023-08-02 | Discharge: 2023-08-03 | Payer: MEDICARE | Attending: Student in an Organized Health Care Education/Training Program | Primary: Student in an Organized Health Care Education/Training Program

## 2023-08-02 ENCOUNTER — Encounter
Admit: 2023-08-02 | Discharge: 2023-08-03 | Payer: MEDICARE | Attending: Student in an Organized Health Care Education/Training Program | Primary: Student in an Organized Health Care Education/Training Program

## 2023-08-02 DIAGNOSIS — Z7682 Awaiting organ transplant status: Principal | ICD-10-CM

## 2023-08-02 LAB — URINALYSIS WITH MICROSCOPY WITH CULTURE REFLEX PERFORMABLE
BACTERIA: NONE SEEN /HPF
BILIRUBIN UA: NEGATIVE
BLOOD UA: NEGATIVE
GLUCOSE UA: NEGATIVE
KETONES UA: NEGATIVE
LEUKOCYTE ESTERASE UA: NEGATIVE
NITRITE UA: NEGATIVE
PH UA: 6 (ref 5.0–9.0)
PROTEIN UA: NEGATIVE
RBC UA: 1 /HPF (ref <=3)
SPECIFIC GRAVITY UA: 1.02 (ref 1.003–1.030)
SQUAMOUS EPITHELIAL: <1 /HPF (ref 0–5)
UROBILINOGEN UA: <2
WBC UA: 1 /HPF (ref <=2)

## 2023-08-02 LAB — PHOSPHORUS: PHOSPHORUS: 3.3 mg/dL (ref 2.4–5.1)

## 2023-08-02 LAB — URIC ACID: URIC ACID: 4.1 mg/dL

## 2023-08-02 LAB — COMPREHENSIVE METABOLIC PANEL
ALBUMIN: 4.2 g/dL (ref 3.4–5.0)
ALKALINE PHOSPHATASE: 85 U/L (ref 46–116)
ALT (SGPT): 40 U/L (ref 10–49)
ANION GAP: 8 mmol/L (ref 5–14)
AST (SGOT): 28 U/L (ref <=34)
BILIRUBIN TOTAL: 0.8 mg/dL (ref 0.3–1.2)
BLOOD UREA NITROGEN: 19 mg/dL (ref 9–23)
BUN / CREAT RATIO: 21
CALCIUM: 9.1 mg/dL (ref 8.7–10.4)
CHLORIDE: 106 mmol/L (ref 98–107)
CO2: 27 mmol/L (ref 20.0–31.0)
CREATININE: 0.92 mg/dL
EGFR CKD-EPI (2021) MALE: 86 mL/min/{1.73_m2} (ref >=60)
GLUCOSE RANDOM: 116 mg/dL (ref 70–179)
POTASSIUM: 3.9 mmol/L (ref 3.4–4.8)
PROTEIN TOTAL: 6.7 g/dL (ref 5.7–8.2)
SODIUM: 141 mmol/L (ref 135–145)

## 2023-08-02 LAB — CBC W/ AUTO DIFF
BASOPHILS ABSOLUTE COUNT: 0 10*9/L (ref 0.0–0.1)
BASOPHILS RELATIVE PERCENT: 1 %
EOSINOPHILS ABSOLUTE COUNT: 0 10*9/L (ref 0.0–0.5)
EOSINOPHILS RELATIVE PERCENT: 0 %
HEMATOCRIT: 37.2 % — ABNORMAL LOW (ref 39.0–48.0)
HEMOGLOBIN: 12.9 g/dL (ref 12.9–16.5)
LYMPHOCYTES ABSOLUTE COUNT: 1 10*9/L — ABNORMAL LOW (ref 1.1–3.6)
LYMPHOCYTES RELATIVE PERCENT: 41.4 %
MEAN CORPUSCULAR HEMOGLOBIN CONC: 34.7 g/dL (ref 32.0–36.0)
MEAN CORPUSCULAR HEMOGLOBIN: 34.6 pg — ABNORMAL HIGH (ref 25.9–32.4)
MEAN CORPUSCULAR VOLUME: 99.5 fL — ABNORMAL HIGH (ref 77.6–95.7)
MEAN PLATELET VOLUME: 7.2 fL (ref 6.8–10.7)
MONOCYTES ABSOLUTE COUNT: 0.2 10*9/L — ABNORMAL LOW (ref 0.3–0.8)
MONOCYTES RELATIVE PERCENT: 6.3 %
NEUTROPHILS ABSOLUTE COUNT: 1.2 10*9/L — ABNORMAL LOW (ref 1.8–7.8)
NEUTROPHILS RELATIVE PERCENT: 51.3 %
PLATELET COUNT: 198 10*9/L (ref 150–450)
RED BLOOD CELL COUNT: 3.73 10*12/L — ABNORMAL LOW (ref 4.26–5.60)
RED CELL DISTRIBUTION WIDTH: 15.4 % — ABNORMAL HIGH (ref 12.2–15.2)
WBC ADJUSTED: 2.4 10*9/L — ABNORMAL LOW (ref 3.6–11.2)

## 2023-08-02 LAB — EPSTEIN-BARR VIRUS ANTIBODY PANEL
EPSTEIN-BARR NUCLEAR ANTIGEN AB: POSITIVE — AB
EPSTEIN-BARR VCA IGG ANTIBODY: POSITIVE — AB
EPSTEIN-BARR VCA IGM ANTIBODY: NEGATIVE

## 2023-08-02 LAB — APTT
APTT: 27.1 s (ref 24.8–38.4)
HEPARIN CORRELATION: 0.2

## 2023-08-02 LAB — MAGNESIUM: MAGNESIUM: 1.8 mg/dL (ref 1.6–2.6)

## 2023-08-02 LAB — CMV IGG: CMV IGG: POSITIVE — AB

## 2023-08-02 LAB — PROTIME-INR
INR: 0.94
PROTIME: 10.6 s (ref 9.9–12.6)

## 2023-08-02 LAB — HSV ANTIBODIES, IGG
HERPES SIMPLEX VIRUS 1 IGG: POSITIVE — AB
HERPES SIMPLEX VIRUS 2 IGG: NEGATIVE
HSV 1 IGG OD: 29.7

## 2023-08-02 LAB — LACTATE DEHYDROGENASE: LACTATE DEHYDROGENASE: 185 U/L (ref 120–246)

## 2023-08-02 LAB — VARICELLA ZOSTER ANTIBODY, IGG: VARICELLA ZOSTER IGG: POSITIVE

## 2023-08-02 LAB — SMEAR - BONE MARROW PATIENT

## 2023-08-02 NOTE — Unmapped (Signed)
Encounter addended by: Hosie Poisson, RN on: 08/02/2023 1:29 PM   Actions taken: Flowsheet accepted, Clinical Note Signed, Charge Capture section accepted

## 2023-08-02 NOTE — Unmapped (Signed)
Patient arrived to room 11 for bone marrow biopsy. Patient consented for procedure by NP, all questions/concerns addressed. Timeout performed. Procedure completed w/o complication, patient discharged to home by NP.

## 2023-08-02 NOTE — Unmapped (Signed)
Bone Marrow Biopsy and Aspiration    Name: Bervin Kroninger   MRNO: [id number]     Indications   Indications : Pre-transplant sample     Oncologic Diagnosis: MM and AML    Lab Results   Component Value Date    WBC 2.4 (L) 08/02/2023    HGB 12.9 08/02/2023    HCT 37.2 (L) 08/02/2023    PLT 198 08/02/2023     I have reviewed lab results which are normal or stable for the scheduled procedure.     There were no vitals filed for this visit.    Procedure:  Time Out: Performed immediately prior to the procedure: yes      The procedure risks, benefits and alternatives of the procedure were explained to the patient.  All questions were answered. Consent was obtained (yes ). RoyPiscitello was made aware that he is not to drive for the next 24 hours if receiving any IV medications associated with this procedure. He verbalized understanding and signed informed consent. After a time-out in which his patient identifiers were checked by 2 providers, the patient was laid in the prone position on the table. The right posterior superior iliac spine and iliac crest were cleaned, prepped and draped in the usual sterile fashion.     Site: right side posterior iliac crest    Aseptic preparation, draping, and technique was used.     Anesthesia: 2% plain lidocaine    Periprocedural medications:  NA.    Core Biopsy and aspirate were obtained and sent for routine histopathologic stains and sectioning, flow cytometry, cytogenetics and molecular analysis..     Pressure applied and hemostasis achieved. Sterile bandage applied.     Complications: None     Additional post procedure instructions:  The patient was given verbal instructions for wound care, such as to keep the biopsy site dry and covered for 24 hours, and to call the provider with including but not limited to: bleeding from the site, redness and/or puss drainage from the site and fever.      Mitzi Hansen, AGNP  [facility] Bone Marrow Transplant [facility] Therapy [facility]

## 2023-08-02 NOTE — Unmapped (Signed)
UA and VRE collected and sent to core lab. Rationale for collecting these samples explained. Written material given. Patient verbalized understanding. Time spent 5 minutes.

## 2023-08-02 NOTE — Unmapped (Signed)
-

## 2023-08-03 LAB — HTLV I/II ANTIBODY: HTLV I/II ANTIBODIES: NEGATIVE

## 2023-08-03 LAB — TOXOPLASMA GONDII ANTIBODY, IGG: TOXOPLASMA GONDII IGG: NEGATIVE

## 2023-08-03 LAB — SYPHILIS SCREEN: SYPHILIS RPR SCREEN: NONREACTIVE

## 2023-08-03 NOTE — Unmapped (Signed)
NEUROLOGICAL OUTPATIENT PHYSICAL THERAPY   DAILY NOTE    Patient Name: Timothy Porter  Date of Birth:09-10-1946  Date: 08/03/2023  Session Number:  12 (4/10 to reassessment)  Therapy Diagnosis:   Encounter Diagnoses   Name Primary?    Multiple myeloma, remission status unspecified (CMS-HCC) Yes    Physical deconditioning     Impaired functional mobility, balance, and endurance     Muscle weakness     Chronic left hip pain            Date of Injury/Onset: Aug 2020  Date of Evaluation: 06/05/2023  Referring Pracitioner: Lindwood Qua*  Certification Dates: 06/06/23-08/28/23      ASSESSMENT:  Reporting period: 06/05/23-06/29/2023  77 y.o. 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 Patient presents to this clinic to improve balance, strength, and endurance in preparation for a stem cell transplant.     Today's session focused on standing strengthening exercises. The patient responded well to cueing to extend fully through his hip and knee when performing forward and lateral step-ups. He demonstrated good balance with forward and lateral cane step-overs.     The patient will continue to benefit from skilled Physical Therapy services for balance training, bilateral LE strengthening, endurance, gait training, postural re-training, and education.    Prior Objective Testing  Tests Eval (06/05/23) 06/06/23    5xSTS 20.3 sec, UE support - 15.3 sec, UE support   - 997 ft  With SPC 1000 ft with SPC   TUG and Cognitive TUG - Without AD:  TUG: 14.5 sec  Cognitive TUG:   15.4 sec TUG: 12.6 sec, no device  TUG Cognitive: 16.2, no device, no errors         Plan for next session: stair navigation (descending is primary concern), step/return with cane, squats to the floor    Problem List: decreased bilateral LE strength, impaired balance, impaired posture, deconditioning, and impaired functional mobility    Personal Factors/Comorbidities Present: Bilateral THA , HTN, multiple myeloma, R femoral lytic lesion, acute myeloid leukemia (AML)      Goals    Patient/Family Goals:  floor transfers, balance, preparing for the stem cell transplant    Short Term Goals:  In  12  visits:  Patient will be able to properly demonstrate current HEP independently x1 in clinic to build upon functional gains in PT.  ONGOING 06/29/2023  Patient will stand on foam with feet together and eyes closed for 30 sec with minimal sway in open gym space to demonstrate increased confidence in balance.  Patient will complete the Timed Up and Go test without an assistive device in < 13.5 seconds to minimize the risk of falls. MET 06/29/2023 12.6 sec, no device  Patient will navigate 8 stairs with unilateral handrail, mod I.   Patient will navigate 6 outdoor curb with assistive device.  Patient will perform floor transfer with external support with SBA.   Patient will stoop to pick up 10 objects off the ground with assistive device. MET 06/29/2023    Long Term Goals:  In 24  visits:  Patient will be able to properly demonstrate updated HEP independently x1 in clinic to build upon functional gains in PT.   Patient will complete 5xSTS without UE support in <15 seconds to demonstrate improved functional LE strength. PROGRESS 06/29/2023 (15.3 sec, UE support)  Patient will complete the Cognitive Timed Up and  Go test without an assistive device in < 17.5 seconds to minimize the risk of falls with increased cognitive demand.   Patient will navigate 16 stairs with unilateral handrail, mod I.   Patient will increase bilateral hip flexor strengthening to 4+/5. PROGRESS 06/29/2023 (4+/5 on L, 4/5 on R)  Patient will perform floor transfer without external support with supervision.  Patient will complete the in >1000 ft with SPC. MET 06/29/2023    PLAN/Recommendations: 3 x a week for 8 weeks from eval  Planned Interventions: Manual Therapy  Gait Training  Therapeutic Activites  Neuromuscular Education  Self-Care  Physical Performance Test  Therapeutic exercise  Balance training  Postural exercises/education  Education  Dry Needling as needed  PT DME/Equipment Recs: none    Home Exercise Program:  -Feet together, eyes open,  3 x hold 30 sec  -standing against wall with scapular retraction for posture and standing endurance, 3 x 1 min  -Mini squats with green band around knees, 3 x 10  - Bridge 3 x10  - side-lying hip abduction 3 x10  - banded seated marching 3 x10  - Standing with feet apart on foam pad, horizontal head turns 3 x5 turns bilat  - Build up lunge with aerobic step on 2 risers and Airex pad, plus bilat UE support, 2 x5-10  - supine posterior pelvic tilt with TrA contraction 2 x10  - supine march with TrA contraction 2 x10  - side-lying clamshell (or supine clamshell with band) 2 x10    SUBJECTIVE:  Pt states:   - He had 7 appointments yesterday, which wore him out. He is getting th bone marrow transplant on Sept 19 and will likely be hospitalized for 1 month.    Reason for Referral/History of Present Condition/Onset of injury/exacerbation:   77 y.o. 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 Referral for conditioning, mobility, BLE strengthening/stretching, balance, core strengthening. Create home exercise program. Patient diagnosed with multiple myeloma in August 2020. He was found to have progressive pancytopenia in Sept 2023 and BMbx was done. 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 He enrolled on BAML S12 arm (ven x 28 days) and began treatment on 11/22/2022. After 1 cycle of treatment he was found to be in complete remission without minimal residual disease. 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 Plans to defer any further multiple myeloma therapy until relapse. He is being considered for a RIC allo stem cell transplant.    Last Fall: June 2024 around pool, needed external support but was able to stand  Precautions: Cancer, Chemotherapy  Red Flags:  none    Prior Functional Status: Working and on feet all the time. Fatigued and then they found the leukemia.    Current Functional Status: Walk 20-minutes per day with cane, half hour watering plants, Tuesdays does laundry. Sometimes uses [employer] for stabilizing. UE and LE resistance bands daily. 7,000 steps/day and 2-3 errands per day.    Currently used Equipment: SPC (only when leaving the house). Has heated pool, treadmill, eliptical  Previous Treatment: PT in 2022-2023 for deconditioning due to multiple myeloma, PT in 2023 for L THA    Social History: Lives with spouse in [place] house with 0 STE, small threshold. 5 steps going into pool unilateral.  Caregiver availability, capability, willingness: Wife retired at  home  Independent w/ ADLs?: Yes.   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   -Wife Lurena Joiner) is a retired     OBJECTIVE :  Pain  Current:  0/10 knee pain    Therex: 40 min for strength/endurance training    Walking up/down stairs with rail and SBA, 1 rep  Forward step-ups with bilat UE support x12 bilat, SBA  Lateral step-ups with bilat UE support 2 x12 bilat, SBA   - VC for full knee/hip extension    Step-return over cane on ground (focus on hip flexion), with UE support from half wall as needed, plus CGA-SBA:   - forward 2 x20   - lateral 2 x20    Walking without cane, with SBA, 50' x6.    Pt required seated breaks between activities due to LE fatigue.    Verbally reviewed HEP and discussed PT POC.       Patient Education: Throughout evaluation patient educated regarding the following: Role of PT in Rehabilitation, HEP, and treatment plan. Patient demonstrated and verbalized agreement and understanding.        Total Time: 40 min   Treatment Rendered:    See above    I attest that I have reviewed the above information.  Signed: Eulas Post, PT  08/03/2023 10:28 AM

## 2023-08-06 LAB — HEPATITIS B SURFACE ANTIGEN: HEPATITIS B SURFACE ANTIGEN: NONREACTIVE

## 2023-08-06 LAB — HEPATITIS C ANTIBODY: HEPATITIS C ANTIBODY: NONREACTIVE

## 2023-08-06 LAB — HIV ANTIGEN/ANTIBODY COMBO: HIV ANTIGEN/ANTIBODY COMBO: NONREACTIVE

## 2023-08-06 LAB — HEPATITIS B CORE ANTIBODY, TOTAL: HEPATITIS B CORE TOTAL ANTIBODY: NONREACTIVE

## 2023-08-07 ENCOUNTER — Telehealth: Admit: 2023-08-07 | Discharge: 2023-08-07 | Payer: MEDICARE

## 2023-08-07 ENCOUNTER — Ambulatory Visit: Admit: 2023-08-07 | Discharge: 2023-08-07 | Payer: MEDICARE | Attending: Registered" | Primary: Registered"

## 2023-08-07 ENCOUNTER — Ambulatory Visit: Admit: 2023-08-07 | Discharge: 2023-08-07 | Payer: MEDICARE | Attending: Internal Medicine | Primary: Internal Medicine

## 2023-08-07 ENCOUNTER — Institutional Professional Consult (permissible substitution): Admit: 2023-08-07 | Discharge: 2023-08-07 | Payer: MEDICARE

## 2023-08-07 DIAGNOSIS — Z7682 Awaiting organ transplant status: Principal | ICD-10-CM

## 2023-08-07 DIAGNOSIS — C9001 Multiple myeloma in remission: Principal | ICD-10-CM

## 2023-08-07 DIAGNOSIS — C9201 Acute myeloblastic leukemia, in remission: Principal | ICD-10-CM

## 2023-08-07 NOTE — Unmapped (Signed)
Patient has met all recipient suitability/eligibility criteria and was cleared to proceed with an allogeneic stem cell transplant by [name] on 9/102024.     Consent for procedure was signed on 08/07/2023.    Family Conference was performed with caregiver/patient present.  The following information was reviewed:     Conditioning Regimen Overview was provided by [name] where the chemotherapy  treatments used in the protocol were discussed.  Potential side effects, therapy related toxicities, risks and benefits were included.  Alternatives to transplant therapy were discussed.  A consent for bone marrow transplant was reviewed and signed.  Applicable data submission consent forms were reviewed and signed.       Additional topics reviewed and discussed included Advance Directives, Pain Management, Isolation Procedures, Visitors Guidelines, Room Assignments, BMT Unit policies, Role of the Caregiver, Housing Arrangements, Arts administrator offered.     Patient/caregiver signed the [facility] Bone Marrow and Stem Cell Transplant Program???s Patient Care Conference form which concur that the above issues were discussed, that they have had the opportunity to ask questions and that their questions were answered to their satisfaction.    Future appointments were reviewed in detail. Patient confirmed understanding.

## 2023-08-07 NOTE — Unmapped (Signed)
[facility] Patient Visit    Patient Name: Timothy Porter   Patient DOB: [birth year] PCP: Timothy Porter, None Per Patient   Primary Oncologist: Margretta Ditty       REASON FOR VISIT: 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    Diagnoses:  Multiple myeloma, kappa free light chain.  Diagnosed in March 2020, autologous hematopoietic cell transplantation with day 0 on 05/28/2019, followed by lenalidomide maintenance.  Bone marrow biopsy in November 2023 with less than 5% plasma cells, serum free light chain analysis in July 2024 with normal serum kappa free light chains.  Serum protein electrophoresis on 06/11/2023 with no monoclonal protein detected.  Acute myeloid leukemia.  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  He was enrolled on a clinical trial on BAML S12 arm (ven x 28 days) and began treatment on 11/22/2022.  After 1 cycle of treatment, he was found to be in a complete remission without minimal residual disease.  He has completed 8 cycles of therapy with most recent cycle - C8D1 07/09/23. He remains in an MRD negative CR on most recent disease assessment BMBx 08/02/23.    Other Problems:  Hypertension.  Benign prostatic hypertrophy.  Glucose intolerance.  History of CMV viremia.  History of atrial fibrillation.    ASSESSMENT:    In summary, this is a 77 year old gentleman with history of multiple myeloma, status post autologous hematopoietic cell transplantation in 2020, now with acute myeloid leukemia on treatment, with both diseases and complete remission, presenting for consent to receive reduced intensity conditioning allogenic hematopoietic cell transplantation.    The patient is familiar with the concept of transplant from his previous experience, but today we again reviewed the procedures involved with allogenic transplant and the risk profile associated with this.  We discussed that he is at the upper age limit of candidacy for allogenic transplant, but he remains interested in moving forward. He has underwent full pre-transplant evaluation including functional assessment visit. His functional status has improved and is able to ambulate independently with only occasional use of a cane for support while walking.     Today we reviewed his CBC, CMP, and other laboratory indices which were not yield any concerning findings. We reviewed his normal EKG, normal TTE, and PFTs with mildly reduced DLCO, but safely above our threshold for transplant. We reviewed is infectious disease markers (IDMs) including CMV and EBV seropositivity which we will continue to monitor throughout his transplant course.     He wishes to proceed with transplantation. His donor (sister) is presenting to our clinic on Thursday (08/09/23) for clearance. He will return next Wednesday (9/18) for his pre-transplant visit including central venous catheter placement and admission the following day to begin allogeneic stem cell transplantation. We will proceed with reduced intensity conditioning with fludarabine/busulfan and post-transplantation cyclophosphamide for GVHD prophylaxis per Bola??os-Meade (NEJM 2023)    HCT-CI Score (08/07/23): 2  [email or web address] restore code: [id number]    PLAN/RECOMMENDATIONS:    [name] will proceed with allogeneic matched related donor (sister) transplantation with reduced intensity conditioning (RIC) using fludarabine/busulfan with post-transplant cyclophosphamide  RTC next Wednesday 08/15/23 for pre-transplant visit    I personally spent 45 minutes face-to-face and non-face-to-face in the care of this patient, which includes all pre, intra, and post visit time on the date of service.  All documented time was specific to the E/M visit and does not include any procedures that may have been performed.    Patient seen and discussed with Dr. Sabas Sous    Malva Cogan, MD  PGY-6, Hematology/Oncology Fellow  [facility]      I saw and evaluated the patient, participating in the key portions of the service.  I reviewed the fellow???s note.  I agree with the fellow???s findings and plan. [name] has a history of MM and AML and now presents for his consent visit in advance of planned RIC MRD alloHCT with bu/flu conditioning. He has adequate disease control, improved functional status, and no uncontrolled comorbidities that would preclude transplantation. He understands the risks and benefits, and consents signed today.     Sabas Sous, MD, MPH  [facility]         HISTORY OF PRESENT ILLNESS:    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 male who comes to the [facility] for consent visit ahead of allogeneic transplant for a diagnosis of acute myeloid leukemia.    [name] is doing well overall. He presents to clinic with his wife, Timothy Porter. He does note some ongoing fatigue, but not new or worsening. He denies any constitutional fever/chills/night sweats. He has good appetite without nausea/vomiting. He does describe GI distress including alternating constipation/diarrhea with fecal urgency, though this is not a new issue and has been ongoing since his auto transplant now 4+ years ago. He has no infectious symptoms and denies sinus congestion, sore throat, cough, rash, dysuria/hematuria.     He is able to walk and exercise every day maybe 20-30 mins per morning. No SOB/DOE. Able to complete all ADLs independently. HEMATOLOGICAL / ONCOLOGICAL HISTORY:  Hematology/Oncology History Overview Note   Referring/Local Oncologist:  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      This electronic signature is attestation that the pathologist personally reviewed the submitted material(s) and the final diagnosis reflects that evaluation.          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,XY[7]         Myeloid Mutational Panel:        Variants of Known/Likely Clinical Significance:   Gene Coding Predicted Protein Variant allele fraction   SRSF2 c.284C>G p.(Pro95Arg) 21.6 %       Treatment Timeline:  11/22/2022 Cycle 1:  BAML S12 azacitidine 75mg /m2 x 7 days + venetoclax 400mg  x 28 days  12/13/22: Bmbx - Limited, though no increase in blasts, MRD-neg by  flow, poor specimen but no blasts (at <0.2%)   01/04/2023 Cycle 2: aza 75 x 7 + ven 400 x 28 days (2 week delay)  01/23/2023: BMbx: <5% blasts by CD34 immunohistochemistry, flow MRD negative  02/09/2023: Cycle 3: Aza 75 x 7 + ven 400 x 21  03/19/2023: Cycle 4 Aza 75 x 5 + ven 400 x 21 (Aza decreased due to delayed count recovery)  04/16/2023: Cycle 5: Aza 75 x 5 + ven 400 x 21  05/14/2023: Cycle 6: Aza 75 x 5 + ven 400 x 21  06/11/2023: Cycle 7: Aza 75 x 5 + ven 400 x 21  07/09/2023:  Cycle 8: Aza 75 x 5 + ven 400 x 21    08/02/23 BMBx:  Bone marrow, right iliac, aspiration and biopsy  -  Hypercellular bone marrow (~50%) with erythroid-predominant trilineage hematopoiesis (<1% blasts and <5% plasma cells by manual aspirate differential)  Cytogenetics: pending  MRD: Flow negative <0.02%        Multiple myeloma (CMS-HCC)   02/12/2019 Initial Diagnosis    Multiple myeloma (CMS-HCC)     05/28/2019 - 06/03/2019 Chemotherapy    BMT IP AUTO MELPHALAN  Melphalan 140 mg/m2 or 200 mg/m2 IV Day -1     10/22/2019 - 06/27/2022 Chemotherapy    STUDY [id number] IRB# [id number] LENALIDOMIDE (v. 11/04/18)  A Randomized Study of Daratumumab Plus Lenalidomide Versus Lenalidomide Alone as Maintenance Treatment in Patients with Newly Diagnosed Multiple Myeloma Who Are Minimal Residual Disease Positive After Frontline Autologous Stem Cell Transplant.     History of auto stem cell transplant (CMS-HCC) (Resolved)   08/05/2019 Initial Diagnosis    History of auto stem cell transplant (CMS-HCC)     10/22/2019 - 06/27/2022 Chemotherapy    STUDY [id number] IRB# [id number] LENALIDOMIDE (v. 11/04/18)  A Randomized Study of Daratumumab Plus Lenalidomide Versus Lenalidomide Alone as Maintenance Treatment in Patients with Newly Diagnosed Multiple Myeloma Who Are Minimal Residual Disease Positive After Frontline Autologous Stem Cell Transplant.     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)         OTHER PAST MEDICAL HISTORY:   Past Medical History:   Diagnosis Date    Benign prostatic hyperplasia with lower urinary tract symptoms 11/27/2018    BPH (benign prostatic hyperplasia)     Diarrhea     Fatigue     Fractures     left arm    Glucose intolerance     May 2019: HbA1c 6.2.    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 CMV negative 04/07/20.     Pancytopenia (CMS-HCC) 03/14/2023    Prediabetes        ALLERGIES: Patient has no known allergies.    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  Planning to build more apartments.     Social Determinants of Health     Financial Resource Strain: Low Risk  (11/02/2022)    Overall Financial Resource Strain (CARDIA)     Difficulty of Paying Living Expenses: Not hard at all   Food Insecurity: No Food Insecurity (03/04/2021)    Hunger Vital Sign     Worried About Running Out of Food in the Last Year: Never true     Ran Out of Food in the Last Year: Never true   Transportation Needs: No Transportation Needs (09/03/2020)    PRAPARE - Therapist, art (Medical): No     Lack of Transportation (Non-Medical): No   Physical Activity: Insufficiently Active (05/28/2019)    Exercise Vital Sign     Days of Exercise per Week: 7 days     Minutes of Exercise per Session: 20 min   Stress: No Stress Concern Present (05/28/2019)    [employer] of Occupational Health - Occupational Stress Questionnaire     Feeling of Stress : Not at all   Social Connections: Unknown (05/28/2019)    Social Connection and Isolation Panel [NHANES]     Frequency of Communication with Friends and Family: More than three times a week     Frequency of Social Gatherings with Friends and Family: Twice a week     Attends Religious Services: Patient declined     Database administrator or Organizations: Yes     Marital Status: Married       FAMILY HISTORY:   Family History   Problem Relation Age of Onset    Dementia Father     No Known Problems Other        REVIEW OF SYSTEMS:  The detailed 12 systems review is as per the HPI and otherwise noncontributory.        MEDICATIONS:   Current Outpatient Medications:     calcium carbonate (TUMS) 200 mg calcium (500 mg) chewable tablet, Chew 2 tablets (400 mg of elem calcium total) Three (3) times a day., Disp: , Rfl:     cholecalciferol, vitamin D3 25 mcg, 1,000 units,, 1,000 unit (25 mcg) tablet, Take 1 tablet (25 mcg total) by mouth., Disp: , Rfl:     cholestyramine (QUESTRAN) 4 gram packet, Take 1 packet by mouth in the morning and 1 packet in the evening. Take with meals. Mix 4 g (1 packet) into 2-3 ounces of fluid and take twice a day.., Disp: 60 packet, Rfl: 5    cyanocobalamin, vitamin B-12, 2,000 mcg Tab, Take 2,000 mcg by mouth in the morning., Disp: 90 tablet, Rfl: 3    docusate sodium (COLACE) 50 MG capsule, Take 1 capsule (50 mg total) by mouth two (2) times a day., Disp: , Rfl:     loratadine (CLARITIN) 10 mg tablet, Take 1 tablet (10 mg total) by mouth daily., Disp: , Rfl:     metoPROLOL succinate (TOPROL-XL) 50 MG 24 hr tablet, Take 1 tablet (50 mg total) by mouth daily., Disp: 30 tablet, Rfl: 11    multivitamin with minerals tablet, Take 1 tablet by mouth daily., Disp: ,  Rfl:     omeprazole (PRILOSEC) 40 MG capsule, TAKE 1 CAPSULE(40 MG) BY MOUTH DAILY, Disp: 90 capsule, Rfl: 0    ondansetron (ZOFRAN) 8 MG tablet, Take 1 tablet (8 mg total) by mouth every eight (8) hours as needed for nausea., Disp: 30 tablet, Rfl: 2    sildenafiL, pulm.hypertension, (REVATIO) 20 mg tablet, Take 1-5 tablets (20-100 mg total) by mouth daily as needed (for erectile function)., Disp: 90 tablet, Rfl: 3    tamsulosin (FLOMAX) 0.4 mg capsule, Take 1 capsule (0.4 mg total) by mouth daily., Disp: 90 capsule, Rfl: 0 valACYclovir (VALTREX) 500 MG tablet, Take 1 tablet (500 mg total) by mouth daily., Disp: 90 tablet, Rfl: 3  No current facility-administered medications for this visit.    Facility-Administered Medications Ordered in Other Visits:     DTaP-hepatitis B recombinant-IPV (PEDIARIX) 10 mcg-25Lf-25 mcg-10Lf/0.5 mL injection, , , ,     DTaP-hepatitis B recombinant-IPV (PEDIARIX) 10 mcg-25Lf-25 mcg-10Lf/0.5 mL injection, , , ,     haemophilus B polysac-tetanus toxoid (ActHIB) 10 mcg/0.5 mL injection, , , ,     influenza vaccine quad (FLUARIX, FLULAVAL, FLUZONE) (6 MOS & UP) 2020-21 ADS Med, , , ,     pneumococcal conjugate (13-valent) (PREVNAR-13) 0.5 mL vaccine, , , ,     pneumococcal conjugate (13-valent) (PREVNAR-13) 0.5 mL vaccine, , , ,     varicella-zoster gE-AS01B (PF) (SHINGRIX) 50 mcg/0.5 mL injection, , , ,      VITAL SIGNS:   BSA: 2.66 meters squared  BP 141/88  - Pulse 97  - Temp 36.1 ??C (97 ??F) (Tympanic)  - Resp 18  - Wt (!) 126.7 kg (279 lb 4.8 oz)  - SpO2 97%  - BMI 31.46 kg/m??     ECOG PERFORMANCE STATUS: 1    PHYSICAL EXAM:  GENERAL: Patient appears well and in no obvious distress.  HEENT: EOMI, no scleral icterus.  CV: Regular rate and rhythm with no murmurs. No edema.  PULM: Clear to auscultation bilaterally.  GI: Abdomen nondistended, nontender with normoactive bowel sounds. There is no organomegaly.  MUSCSK: No palpable abnormalities. No spinal/paraspinal tenderness.  LYMPH: No palpable cervical, supraclavicular, axillary, epitrochlear lymphadenopathy.  NEURO: A&Ox4  SKIN: no rashes or lesions     LABS/IMAGING/OTHER TESTS:  Lab Results   Component Value Date    WBC 2.4 (L) 08/02/2023    HGB 12.9 08/02/2023    HCT 37.2 (L) 08/02/2023    PLT 198 08/02/2023    NEUTROABS 1.2 (L) 08/02/2023    LYMPHSABS 1.0 (L) 08/02/2023    MCV 99.5 (H) 08/02/2023    NA 141 08/02/2023    K 3.9 08/02/2023    CL 106 08/02/2023    CO2 27.0 08/02/2023    BUN 19 08/02/2023    CREATININE 0.92 08/02/2023    GLU 116 08/02/2023 CALCIUM 9.1 08/02/2023    MG 1.8 08/02/2023    PHOS 3.3 08/02/2023    URICACID 4.1 08/02/2023    BILITOT 0.8 08/02/2023    BILIDIR <0.10 07/03/2019    PROT 6.7 08/02/2023    ALBUMIN 4.2 08/02/2023    ALT 40 08/02/2023    AST 28 08/02/2023    ALKPHOS 85 08/02/2023    GGT 64 06/28/2019    INR 0.94 08/02/2023    APTT 27.1 08/02/2023    B2MG S 2.32 08/24/2020    LDH 185 08/02/2023    IGG 857 06/11/2023    IGM 8 (L) 06/11/2023    IGA 20.0 (L) 06/11/2023  SPEINTERP  06/11/2023      Comment:      The SPE pattern demonstrates a slight irregularity in the gamma region, which may represent monoclonal protein. See Immunofixation report.    IFE  06/11/2023      Comment:      No monoclonal protein detected.    KAPFS 0.84 06/11/2023    LAMFS 0.60 06/11/2023    RATKL 1.40 06/11/2023    IFEUR  05/13/2019      Comment:      Monoclonal component typed as free Kappa light chains.

## 2023-08-07 NOTE — Unmapped (Signed)
Brief Nutrition Note    Reason for referral: immunosuppressed diet instruction    Pt with h/o multiple myeloma, autologous stem cell transplant 2020, pre diabetes and acute myeloid leukemia. Pending allogeneic stem sell transplant noted.    Anthropometrics:  Ht: 200.7 cm  Wt: 126.7 (kg) 08/07/23  BMI: 31.46 kg/m2  Ideal Body Weight:100 (kg)   Usual body Weight- 122.7 (kg) per patient    Pt reports decreased appetite. He noted prior to decrease in appetite eating multiple portions of food at one meal. Has been trying to snack in between meals or split larger meals into 2. Diet recall below. Noted recent difficulty with constipation but overall more control and improvement in bowel movements since last year. Taking Questran.  Denied any episodes of vomiting or oral issues such as difficulty chewing or swallowing. Taking multivitamin, Tums for calcium, B12 and Vitamin D3.     Diet Recall:   Breakfast: daily eats 2 scrambled eggs with sharp cheddar cheese and salsa added, 1 English muffin with orange jam, various fruits and deli ham.  Lunch- typically eats ~11am and ~1pm. May snack on pretzels at the 11am time slot. Yestarday ate lefotver [place] food from Sunday sprinkled on nachos.   Dinner- typically eats ~5/5:30pm and ~7:30pm-roasted potatoes with cajun seasoning and chicken thighs    Beverages: Regular coke 2-3 daily, water- city water    Intervention:  1.  Nutrition education provided on Allogeneic SCT--Food Safety.  USDA-FDA Food Safety for Transplant Recipients handout referenced in education--pt has a copy in his BMT patient information binder.     Pt's questions and comments indicated good understanding.   =======================================================  Reminded pt to call if any questions arise.  Will follow up as needed.    Time spent 20 minutes     The patient reports they are physically located in [place] and is currently: not at home. I conducted a audio/video visit. I spent  70m 47s on the video call with the patient. I spent an additional 30 minutes on pre- and post-visit activities on the date of service .         I am located on-site and the patient is located off-site for this visit.     Melynda Keller MS, RD, CSO, LDN  Outpatient Nutrition Services, [facility]  p [phone]

## 2023-08-09 NOTE — Unmapped (Signed)
Outpatient BMT SW Note: SW received a call from pt. Pt reports his donor is flying in next week and he wanted to know if her flight expenses and other costs might be covered. SW offered blameless apology, but stated she was unaware if donor's costs were covered. SW encouraged pt to reach out to his RN Coordinator for assistance with this.    SW offered to provide pt with her contact information.     Pt was appreciative, but stated he had contact information for nurse.    SW encouraged him to call back if she could further assist in any way.

## 2023-08-10 LAB — HLA DS POST TRANSPLANT
ANTI-DONOR HLA-A #1 MFI: 10 MFI
ANTI-DONOR HLA-A #2 MFI: 10 MFI
ANTI-DONOR HLA-B #1 MFI: 19 MFI
ANTI-DONOR HLA-B #2 MFI: 36 MFI
ANTI-DONOR HLA-C #1 MFI: 3 MFI
ANTI-DONOR HLA-C #2 MFI: 0 MFI
ANTI-DONOR HLA-DQB #1 MFI: 35 MFI
ANTI-DONOR HLA-DQB #2 MFI: 28 MFI
ANTI-DONOR HLA-DR #1 MFI: 34 MFI
ANTI-DONOR HLA-DR #2 MFI: 87 MFI
DSA PT DONOR ID: 4253043

## 2023-08-10 LAB — FSAB CLASS 1 ANTIBODY SPECIFICITY: HLA CLASS 1 ANTIBODY RESULT: NEGATIVE

## 2023-08-10 LAB — FSAB CLASS 2 ANTIBODY SPECIFICITY: HLA CL2 AB RESULT: NEGATIVE

## 2023-08-10 NOTE — Unmapped (Signed)
Specialty Medication(s): venetoclax    Timothy Porter has been dis-enrolled from the [facility] specialty pharmacy services due to  undergoing Bone Marrow Transplant .    Additional information provided to the patient: NA    [name], [name]  [name] Specialty Pharmacist

## 2023-08-13 DIAGNOSIS — C9000 Multiple myeloma not having achieved remission: Principal | ICD-10-CM

## 2023-08-15 ENCOUNTER — Other Ambulatory Visit: Admit: 2023-08-15 | Discharge: 2023-08-15 | Payer: MEDICARE

## 2023-08-15 ENCOUNTER — Ambulatory Visit: Admit: 2023-08-15 | Discharge: 2023-08-15 | Payer: MEDICARE

## 2023-08-15 ENCOUNTER — Encounter: Admit: 2023-08-15 | Discharge: 2023-08-15 | Payer: MEDICARE | Attending: Internal Medicine | Primary: Internal Medicine

## 2023-08-15 ENCOUNTER — Ambulatory Visit
Admit: 2023-08-15 | Discharge: 2023-08-15 | Payer: MEDICARE | Attending: Nurse Practitioner | Primary: Nurse Practitioner

## 2023-08-15 DIAGNOSIS — Z7682 Awaiting organ transplant status: Principal | ICD-10-CM

## 2023-08-15 DIAGNOSIS — Z9484 Stem cells transplant status: Principal | ICD-10-CM

## 2023-08-15 DIAGNOSIS — C9201 Acute myeloblastic leukemia, in remission: Principal | ICD-10-CM

## 2023-08-15 DIAGNOSIS — C9200 Acute myeloblastic leukemia, not having achieved remission: Principal | ICD-10-CM

## 2023-08-16 ENCOUNTER — Ambulatory Visit: Admit: 2023-08-16 | Discharge: 2023-09-10 | Disposition: A | Discharge status: Home / Self Care | Payer: MEDICARE

## 2023-08-16 ENCOUNTER — Encounter: Admit: 2023-08-16 | Discharge: 2023-09-10 | Disposition: A | Discharge status: Home / Self Care | Payer: MEDICARE

## 2023-08-16 ENCOUNTER — Encounter: Admit: 2023-08-16 | Payer: MEDICARE

## 2023-08-16 ENCOUNTER — Encounter: Admit: 2023-08-16 | Payer: MEDICARE | Attending: Family

## 2023-08-16 ENCOUNTER — Ambulatory Visit: Admit: 2023-08-16 | Discharge: 2023-09-10 | Payer: MEDICARE

## 2023-08-16 MED ORDER — TACROLIMUS 0.5 MG CAPSULE, IMMEDIATE-RELEASE
ORAL_CAPSULE | Freq: Two times a day (BID) | ORAL | 0 refills | 30 days
Start: 2023-08-16 — End: 2023-08-16

## 2023-08-16 MED ORDER — LETERMOVIR 480 MG TABLET
ORAL_TABLET | Freq: Every day | ORAL | 0 refills | 28 days
Start: 2023-08-16 — End: 2023-08-16

## 2023-08-16 MED ORDER — MYCOPHENOLATE MOFETIL 500 MG TABLET
ORAL_TABLET | Freq: Three times a day (TID) | ORAL | 0 refills | 30 days
Start: 2023-08-16 — End: 2023-08-16

## 2023-08-17 MED ORDER — LETERMOVIR 480 MG TABLET
ORAL_TABLET | Freq: Every day | ORAL | 6 refills | 28.00000 days | Status: CP
Start: 2023-08-17 — End: ?
  Filled 2023-09-10: qty 28, 28d supply, fill #0

## 2023-08-20 DIAGNOSIS — Z9484 Stem cells transplant status: Principal | ICD-10-CM

## 2023-08-26 LAB — CBC W/ AUTO DIFF
BASOPHILS ABSOLUTE COUNT: 0 10*9/L (ref 0.0–0.1)
BASOPHILS RELATIVE PERCENT: 0.2 %
EOSINOPHILS ABSOLUTE COUNT: 0 10*9/L (ref 0.0–0.5)
EOSINOPHILS RELATIVE PERCENT: 0 %
HEMATOCRIT: 28.3 % — ABNORMAL LOW (ref 39.0–48.0)
HEMOGLOBIN: 10 g/dL — ABNORMAL LOW (ref 12.9–16.5)
LYMPHOCYTES ABSOLUTE COUNT: 0.2 10*9/L — ABNORMAL LOW (ref 1.1–3.6)
LYMPHOCYTES RELATIVE PERCENT: 10.8 %
MEAN CORPUSCULAR HEMOGLOBIN CONC: 35.2 g/dL (ref 32.0–36.0)
MEAN CORPUSCULAR HEMOGLOBIN: 35.1 pg — ABNORMAL HIGH (ref 25.9–32.4)
MEAN CORPUSCULAR VOLUME: 99.6 fL — ABNORMAL HIGH (ref 77.6–95.7)
MEAN PLATELET VOLUME: 6.8 fL (ref 6.8–10.7)
MONOCYTES ABSOLUTE COUNT: 0.1 10*9/L — ABNORMAL LOW (ref 0.3–0.8)
MONOCYTES RELATIVE PERCENT: 2.9 %
NEUTROPHILS ABSOLUTE COUNT: 1.7 10*9/L — ABNORMAL LOW (ref 1.8–7.8)
NEUTROPHILS RELATIVE PERCENT: 86.1 %
PLATELET COUNT: 61 10*9/L — ABNORMAL LOW (ref 150–450)
RED BLOOD CELL COUNT: 2.84 10*12/L — ABNORMAL LOW (ref 4.26–5.60)
RED CELL DISTRIBUTION WIDTH: 14.7 % (ref 12.2–15.2)
WBC ADJUSTED: 2 10*9/L — ABNORMAL LOW (ref 3.6–11.2)

## 2023-08-26 LAB — BASIC METABOLIC PANEL
ANION GAP: 2 mmol/L — ABNORMAL LOW (ref 5–14)
BLOOD UREA NITROGEN: 21 mg/dL (ref 9–23)
BUN / CREAT RATIO: 24
CALCIUM: 7.9 mg/dL — ABNORMAL LOW (ref 8.7–10.4)
CHLORIDE: 110 mmol/L — ABNORMAL HIGH (ref 98–107)
CO2: 27 mmol/L (ref 20.0–31.0)
CREATININE: 0.87 mg/dL
EGFR CKD-EPI (2021) MALE: 89 mL/min/{1.73_m2} (ref >=60)
GLUCOSE RANDOM: 113 mg/dL (ref 70–179)
POTASSIUM: 4 mmol/L (ref 3.4–4.8)
SODIUM: 139 mmol/L (ref 135–145)

## 2023-08-26 LAB — URINALYSIS WITH MICROSCOPY
BACTERIA: NONE SEEN /HPF
BILIRUBIN UA: NEGATIVE
BLOOD UA: NEGATIVE
GLUCOSE UA: NEGATIVE
KETONES UA: 60 — AB
LEUKOCYTE ESTERASE UA: NEGATIVE
NITRITE UA: NEGATIVE
PH UA: 6 (ref 5.0–9.0)
PROTEIN UA: NEGATIVE
RBC UA: 1 /HPF (ref <=3)
SPECIFIC GRAVITY UA: 1.012 (ref 1.003–1.030)
SQUAMOUS EPITHELIAL: <1 /HPF (ref 0–5)
UROBILINOGEN UA: <2
WBC UA: <1 /HPF (ref <=2)

## 2023-08-26 LAB — MAGNESIUM: MAGNESIUM: 1.9 mg/dL (ref 1.6–2.6)

## 2023-08-26 MED ADMIN — sodium chloride (NS) 0.9 % infusion: 100 mL/h | INTRAVENOUS | @ 21:00:00 | Stop: 2023-08-29

## 2023-08-26 MED ADMIN — sodium chloride 0.9% (NS) bolus 500 mL: 500 mL | INTRAVENOUS | @ 12:00:00 | Stop: 2023-08-26

## 2023-08-26 MED ADMIN — cholecalciferol (vitamin D3 25 mcg (1,000 units)) tablet 25 mcg: 25 ug | ORAL | @ 13:00:00

## 2023-08-26 MED ADMIN — sodium chloride (NS) 0.9 % infusion: 100 mL/h | INTRAVENOUS | @ 04:00:00 | Stop: 2023-08-29

## 2023-08-26 MED ADMIN — amlodipine (NORVASC) tablet 5 mg: 5 mg | ORAL | @ 13:00:00

## 2023-08-26 MED ADMIN — cholestyramine (QUESTRAN) 4 gram packet 0.5 packet: .5 | ORAL | @ 21:00:00

## 2023-08-26 MED ADMIN — ondansetron (ZOFRAN) tablet 24 mg: 24 mg | ORAL | @ 14:00:00 | Stop: 2023-08-26

## 2023-08-26 MED ADMIN — mesna (MESNEX) 4,430 mg in sodium chloride (NS) 0.9 % 1,000 mL IVPB: 50 mg/kg | INTRAVENOUS | @ 14:00:00 | Stop: 2023-08-27

## 2023-08-26 MED ADMIN — fluticasone propionate (FLONASE) 50 mcg/actuation nasal spray 2 spray: 2 | NASAL | @ 13:00:00

## 2023-08-26 MED ADMIN — levoFLOXacin (LEVAQUIN) tablet 500 mg: 500 mg | ORAL | @ 13:00:00 | Stop: 2023-09-05

## 2023-08-26 MED ADMIN — calcium carbonate (TUMS) chewable tablet 400 mg elem calcium: 400 mg | ORAL | @ 18:00:00

## 2023-08-26 MED ADMIN — fluconazole (DIFLUCAN) tablet 400 mg: 400 mg | ORAL | @ 13:00:00 | Stop: 2023-09-21

## 2023-08-26 MED ADMIN — loratadine (CLARITIN) tablet 10 mg: 10 mg | ORAL | @ 13:00:00

## 2023-08-26 MED ADMIN — cyanocobalamin (vitamin B-12) tablet 2,000 mcg: 2000 ug | ORAL | @ 13:00:00

## 2023-08-26 MED ADMIN — valACYclovir (VALTREX) tablet 500 mg: 500 mg | ORAL | @ 13:00:00

## 2023-08-26 MED ADMIN — magnesium sulfate 2gm/50mL IVPB: 2 g | INTRAVENOUS | @ 08:00:00

## 2023-08-26 MED ADMIN — cycloPHOSphamide (CYTOXAN) IVPB 4,430 mg: 50 mg/kg | INTRAVENOUS | @ 14:00:00 | Stop: 2023-08-26

## 2023-08-26 MED ADMIN — calcium carbonate (TUMS) chewable tablet 400 mg elem calcium: 400 mg | ORAL | @ 10:00:00

## 2023-08-26 MED ADMIN — cholestyramine (QUESTRAN) 4 gram packet 0.5 packet: .5 | ORAL | @ 12:00:00

## 2023-08-26 MED ADMIN — metoPROLOL succinate (Toprol-XL) 24 hr tablet 50 mg: 50 mg | ORAL | @ 13:00:00

## 2023-08-26 MED ADMIN — pantoprazole (Protonix) EC tablet 40 mg: 40 mg | ORAL | @ 13:00:00

## 2023-08-26 NOTE — Unmapped (Signed)
Bone Marrow Transplant and Cellular Therapy Program History and Physical    Patient Name: Timothy Porter  MRN: [id number]  Encounter Date: 08/26/23    [facility] Myeloma Physician: Dr Vivien Presto Leukemia Physician: Dr. [name]  Primary Care Provider: Pcp, None Per Patient  BMT Attending MD: 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  Type of Transplant: RIC MRD Allo  Graft Source: Fresh PBSCs  Transplant Day: 1550 (Autologous PBSC Unknown Phase - Planned 05/29/2019), 4 (Allogeneic PBSC Unknown Phase - Planned 08/22/2023)     HPI:  Timothy Porter is a 77 y.o. male with a diagnosis of AML. Developed progressive pancytopenia in Sept 2023. Marrow showed AML with 23% blasts and FISH w/MECOM/RUNX1 by [facility] and SRFS2 mutations. He was enrolled on a clinical trial on BAML S12 arm (ven x 28 days) and began treatment on 11/22/2022.  After 1 cycle of treatment, he was found to be in a complete remission without minimal residual disease.  He has completed 8 cycles of therapy with most recent cycle - C8D1 07/09/23. He remains in an MRD negative CR on most recent disease assessment BMBx 08/02/23.     His other co-morbidities include a history of a-fib. His functional status has been his biggest issue in regards to recovery from his chemotherapy treatments. He is followed by Dr. Sudie Bailey with physical medicine and rehab. He has been working with PT and diligent in his home exercise program. Now is walking with a cane most of the time. He will need PT/OT consults while admitted.     Interval History:  - No acute events overnight  - Denies N/V/D. Good PO intake about 2L yesterday. Eating as tolerated.  - Weight change: 1.769 kg (3 lb 14.4 oz). No edema or fluids overload noted. If weight continues to increase tomorrow, will consider lasix  - Chronic cough improved with Flonase and PRN robitussin  - AST/ ALT stable  - Afebrile and VSS  - Plan to continue with PTCy today and F/U daily UA    Review of Systems:  A comprehensive ROS performed and is negative except for pertinent positives as listed above in interval history.     Objective:  Wt Readings from Last 1 Encounters:   08/25/23 (!) 126.7 kg (279 lb 4.8 oz)     Temp Readings from Last 1 Encounters:   08/26/23 36.4 ??C (97.5 ??F) (Oral)     BP Readings from Last 1 Encounters:   08/26/23 149/83     Pulse Readings from Last 1 Encounters:   08/26/23 88     SpO2 Readings from Last 1 Encounters:   08/26/23 98%     80, Normal activity with effort; some signs or symptoms of disease (ECOG equivalent 1)    Physical Exam: No change from 08/23/23  General: Sitting up in chair, no acute distress noted.   Central venous access: Line clean, dry, intact. No erythema or drainage noted.   ENT: Moist mucous membranes. Oropharhynx without lesions, erythema or exudate.   Cardiovascular: Pulse normal rate, regularity and rhythm. S1 and S2 normal, without any murmur, rub, or gallop.  Lungs: Clear to auscultation bilaterally, without wheezes/crackles/rhonchi. Good air movement.   Skin: Warm, dry, intact. No rash noted.    Psychiatry: Alert and oriented to person, place, and time.   Gastrointestinal/Abdomen: Normoactive bowel sounds, abdomen soft, non-tender   Musculoskeletal/Extremities: FROM throughout. No edema  Neurologic: CNII-XII intact.  Normal strength and sensation throughout    Test Results:   I reviewed all labs from today in Epic. See EMR for lab results.    Assessment/Plan:    BMT: AML with prior history of MM. 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  RIC Flu/Bu Conditioning. Regimen Consists of:  1. Fludarabine 30 mg/m2 days -6, -5, -4, -3, -2  2. Busulfan 3.2 mg/kg days -5, -4    Donor: 8/8, ABO O+, CMV positive  Recipient A+ and CMV+  Engraftment: G-CSF starting Day + 5 through WBC recovery (as defined as ANC 1.0 x 2 days or 3.0 x 1 day)  -Date of last G-CSF injection: TBD    GVHD prophylaxis:   1. Cyclophosphamide 50 mg/kg IV daily on days +3 and +4 with mesna  2. Tacrolimus to goal trough (5-10 ng/mL) starting day +5  3. Mycophenolate mofetil 15 mg/kg PO TID (max 1g PO TID) starting day +5 through day +35     Hem:   Transfusion criteria: Transfuse 1 unit of PRBCs for hemoglobin <7 and 1 unit of platelets for Plt <10K or bleeding. Timothy Porter. Timothy Porter does not have a history of transfusion reactions. Consent was obtained and documented on 08/15/23.    ID:   Prophylaxis:  -Antiviral: Valtrex 500 mg po, increased to BID on admit and continue through 2 years post transplant                   Letermovir 480 mg po to start at day +15 or hospital discharge and continue through day +200  -Antifungal: Fluconazole 400 mg po daily, continue through day +75  -Antibacterial: Levaquin 500 mg po daily until no longer neutropenic  -PJP: Bactrim DS once daily MWF upon platelet engraftment.     GI:   GERD prophylaxis:  -Home med: Prilosec 40 mg daily. Will switch to pantoprazole while inpatient    Nausea:  -Anti-emetics per BMT protocol.     Hx of IBS with chronic alternating diarrhea/constipation  -Has managed with changing doses of Questran; increased to 1 packet BID in 02/2023 with improvement of diarrhea; self tapered to 0.5 packets BID  -Reports predominantly constipation concerns but would have occasional accidents; says he hasn't soiled himself in months with current dose  -Uses docusate only if no BM 3-4 days  -Meticulously self tracks and titrates medication  -Continue Questran dosing on admission with prn docusate; likely will need to hold if he develops diarrhea    Diarrhea:  -Antidiarrheals-see above    Renal: Creatinine normal. No issues.   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 SOS prophylaxis not indicated for RIC regimen.   - 08/23/23: Slight increase in AST/ALT, will repeat LFTs this evening  - 08/24/23: AST/ALT stable    CV:   -Hx of A-fib with transplant in 2020; required diltiazem+metoprolol; resolved post acute episode     * Continues on metoprolol mainly for BP.      * Plan for cardiac electrolytes    -HTN: has discontinued diltiazem prior to the start of venetoclax (for DDI) currently managed with metoprolol monotherapy; decreased to 50 mg/day ~1-2 months ago  -9/20: Adding norvasc 5mg  daily for persistent HTN  Pulm: No current issues.    ENT:  - Chronic cough when supine from PND? Try flonase or saline spray    GU:   Hx of BPH, previously on flomax but per patient not taking.   Hold sildenafil (for ED)    Endocrine:  Hx of steroid induced hyperglycemia not currently on medications    Neuro/Pain: No issues     Psych: CCSP consult prn.     Deconditioning:  - PT/OT consulted  - Uses a cane for walking.     Consults:  While inpatient, consider consults to music therapy, integrative medicine and massage therapy.     Tia Masker  Bone Marrow Transplant and Cellular Therapy Program

## 2023-08-26 NOTE — Unmapped (Signed)
Patient is day +4 from his Allo MRD SCT. Patient is A&O x 4. Vital signs stable and patient remained afebrile throughout shift. PRNs given throughout the shift included magnesium per protocol. No acute events throughout the shift. Plan of care reviewed with patient. Safety measures present throughout shift.     Problem: Adult Inpatient Plan of Care  Goal: Plan of Care Review  Outcome: Ongoing - Unchanged  Goal: Patient-Specific Goal (Individualized)  Outcome: Ongoing - Unchanged  Goal: Absence of Hospital-Acquired Illness or Injury  Outcome: Ongoing - Unchanged  Intervention: Identify and Manage Fall Risk  Recent Flowsheet Documentation  Taken 08/25/2023 2030 by Concha Se, RN  Safety Interventions:   bleeding precautions   infection management   isolation precautions   lighting adjusted for tasks/safety   low bed   neutropenic precautions   nonskid shoes/slippers when out of bed  Intervention: Prevent Skin Injury  Recent Flowsheet Documentation  Taken 08/25/2023 2030 by Concha Se, RN  Positioning for Skin: Supine/Back  Device Skin Pressure Protection:   adhesive use limited   tubing/devices free from skin contact  Skin Protection:   adhesive use limited   transparent dressing maintained   tubing/devices free from skin contact  Intervention: Prevent and Manage VTE (Venous Thromboembolism) Risk  Recent Flowsheet Documentation  Taken 08/25/2023 2030 by Concha Se, RN  VTE Prevention/Management:   ambulation promoted   fluids promoted   intravenous hydration  Intervention: Prevent Infection  Recent Flowsheet Documentation  Taken 08/25/2023 2030 by Concha Se, RN  Infection Prevention:   environmental surveillance performed   equipment surfaces disinfected   hand hygiene promoted   personal protective equipment utilized   rest/sleep promoted   single patient room provided  Goal: Optimal Comfort and Wellbeing  Outcome: Ongoing - Unchanged  Goal: Readiness for Transition of Care  Outcome: Ongoing - Unchanged  Goal: Rounds/Family Conference  Outcome: Ongoing - Unchanged     Problem: Infection  Goal: Absence of Infection Signs and Symptoms  Outcome: Ongoing - Unchanged  Intervention: Prevent or Manage Infection  Recent Flowsheet Documentation  Taken 08/25/2023 2030 by Concha Se, RN  Infection Management: aseptic technique maintained  Isolation Precautions: protective precautions maintained     Problem: Fall Injury Risk  Goal: Absence of Fall and Fall-Related Injury  Outcome: Ongoing - Unchanged  Intervention: Promote Injury-Free Environment  Recent Flowsheet Documentation  Taken 08/25/2023 2030 by Concha Se, RN  Safety Interventions:   bleeding precautions   infection management   isolation precautions   lighting adjusted for tasks/safety   low bed   neutropenic precautions   nonskid shoes/slippers when out of bed     Problem: Fall Injury Risk  Goal: Absence of Fall and Fall-Related Injury  Outcome: Ongoing - Unchanged  Intervention: Promote Injury-Free Environment  Recent Flowsheet Documentation  Taken 08/25/2023 2030 by Concha Se, RN  Safety Interventions:   bleeding precautions   infection management   isolation precautions   lighting adjusted for tasks/safety   low bed   neutropenic precautions   nonskid shoes/slippers when out of bed     Problem: Comorbidity Management  Goal: Blood Pressure in Desired Range  Outcome: Ongoing - Unchanged

## 2023-08-27 LAB — BASIC METABOLIC PANEL
ANION GAP: 2 mmol/L — ABNORMAL LOW (ref 5–14)
BLOOD UREA NITROGEN: 16 mg/dL (ref 9–23)
BUN / CREAT RATIO: 19
CALCIUM: 8 mg/dL — ABNORMAL LOW (ref 8.7–10.4)
CHLORIDE: 111 mmol/L — ABNORMAL HIGH (ref 98–107)
CO2: 26 mmol/L (ref 20.0–31.0)
CREATININE: 0.84 mg/dL
EGFR CKD-EPI (2021) MALE: 90 mL/min/{1.73_m2} (ref >=60)
GLUCOSE RANDOM: 114 mg/dL (ref 70–179)
POTASSIUM: 3.7 mmol/L (ref 3.4–4.8)
SODIUM: 139 mmol/L (ref 135–145)

## 2023-08-27 LAB — CBC W/ AUTO DIFF
BASOPHILS ABSOLUTE COUNT: 0 10*9/L (ref 0.0–0.1)
BASOPHILS ABSOLUTE COUNT: 0 10*9/L (ref 0.0–0.1)
BASOPHILS RELATIVE PERCENT: 0.3 %
BASOPHILS RELATIVE PERCENT: 0.1 %
EOSINOPHILS ABSOLUTE COUNT: 0 10*9/L (ref 0.0–0.5)
EOSINOPHILS ABSOLUTE COUNT: 0 10*9/L (ref 0.0–0.5)
EOSINOPHILS RELATIVE PERCENT: 0 %
EOSINOPHILS RELATIVE PERCENT: 0 %
HEMATOCRIT: 28 % — ABNORMAL LOW (ref 39.0–48.0)
HEMATOCRIT: 26.7 % — ABNORMAL LOW (ref 39.0–48.0)
HEMOGLOBIN: 9.2 g/dL — ABNORMAL LOW (ref 12.9–16.5)
HEMOGLOBIN: 9.8 g/dL — ABNORMAL LOW (ref 12.9–16.5)
LYMPHOCYTES ABSOLUTE COUNT: 0.2 10*9/L — ABNORMAL LOW (ref 1.1–3.6)
LYMPHOCYTES ABSOLUTE COUNT: 0.1 10*9/L — ABNORMAL LOW (ref 1.1–3.6)
LYMPHOCYTES RELATIVE PERCENT: 7.7 %
LYMPHOCYTES RELATIVE PERCENT: 4.6 %
MEAN CORPUSCULAR HEMOGLOBIN CONC: 33 g/dL (ref 32.0–36.0)
MEAN CORPUSCULAR HEMOGLOBIN CONC: 36.5 g/dL — ABNORMAL HIGH (ref 32.0–36.0)
MEAN CORPUSCULAR HEMOGLOBIN: 34.3 pg — ABNORMAL HIGH (ref 25.9–32.4)
MEAN CORPUSCULAR HEMOGLOBIN: 35.8 pg — ABNORMAL HIGH (ref 25.9–32.4)
MEAN CORPUSCULAR VOLUME: 103.9 fL — ABNORMAL HIGH (ref 77.6–95.7)
MEAN CORPUSCULAR VOLUME: 98.2 fL — ABNORMAL HIGH (ref 77.6–95.7)
MEAN PLATELET VOLUME: 7.7 fL (ref 6.8–10.7)
MEAN PLATELET VOLUME: 7 fL (ref 6.8–10.7)
MONOCYTES ABSOLUTE COUNT: 0.1 10*9/L — ABNORMAL LOW (ref 0.3–0.8)
MONOCYTES ABSOLUTE COUNT: 0.1 10*9/L — ABNORMAL LOW (ref 0.3–0.8)
MONOCYTES RELATIVE PERCENT: 3.2 %
MONOCYTES RELATIVE PERCENT: 2 %
NEUTROPHILS ABSOLUTE COUNT: 1.8 10*9/L (ref 1.8–7.8)
NEUTROPHILS ABSOLUTE COUNT: 2.5 10*9/L (ref 1.8–7.8)
NEUTROPHILS RELATIVE PERCENT: 88.8 %
NEUTROPHILS RELATIVE PERCENT: 93.3 %
PLATELET COUNT: 48 10*9/L — ABNORMAL LOW (ref 150–450)
PLATELET COUNT: 30 10*9/L — ABNORMAL LOW (ref 150–450)
RED BLOOD CELL COUNT: 2.69 10*12/L — ABNORMAL LOW (ref 4.26–5.60)
RED BLOOD CELL COUNT: 2.72 10*12/L — ABNORMAL LOW (ref 4.26–5.60)
RED CELL DISTRIBUTION WIDTH: 14 % (ref 12.2–15.2)
RED CELL DISTRIBUTION WIDTH: 14.3 % (ref 12.2–15.2)
WBC ADJUSTED: 2.1 10*9/L — ABNORMAL LOW (ref 3.6–11.2)
WBC ADJUSTED: 2.7 10*9/L — ABNORMAL LOW (ref 3.6–11.2)

## 2023-08-27 LAB — HEPATIC FUNCTION PANEL
ALBUMIN: 3 g/dL — ABNORMAL LOW (ref 3.4–5.0)
ALKALINE PHOSPHATASE: 80 U/L (ref 46–116)
ALT (SGPT): 61 U/L — ABNORMAL HIGH (ref 10–49)
AST (SGOT): 30 U/L (ref <=34)
BILIRUBIN DIRECT: 0.3 mg/dL (ref 0.00–0.30)
BILIRUBIN TOTAL: 0.6 mg/dL (ref 0.3–1.2)
PROTEIN TOTAL: 5.3 g/dL — ABNORMAL LOW (ref 5.7–8.2)

## 2023-08-27 LAB — MAGNESIUM: MAGNESIUM: 1.9 mg/dL (ref 1.6–2.6)

## 2023-08-27 LAB — URINALYSIS WITH MICROSCOPY
BACTERIA: NONE SEEN /HPF
BILIRUBIN UA: NEGATIVE
BLOOD UA: NEGATIVE
GLUCOSE UA: NEGATIVE
KETONES UA: 60 — AB
LEUKOCYTE ESTERASE UA: NEGATIVE
NITRITE UA: NEGATIVE
PH UA: 6.5 (ref 5.0–9.0)
PROTEIN UA: NEGATIVE
RBC UA: 1 /HPF (ref <=3)
SPECIFIC GRAVITY UA: 1.01 (ref 1.003–1.030)
SQUAMOUS EPITHELIAL: <1 /HPF (ref 0–5)
UROBILINOGEN UA: <2
WBC UA: <1 /HPF (ref <=2)

## 2023-08-27 LAB — LACTATE DEHYDROGENASE: LACTATE DEHYDROGENASE: 174 U/L (ref 120–246)

## 2023-08-27 LAB — IONIZED CALCIUM VENOUS: CALCIUM IONIZED VENOUS (MG/DL): 4.54 mg/dL (ref 4.40–5.40)

## 2023-08-27 LAB — APTT
APTT: 32.2 s (ref 24.8–38.4)
HEPARIN CORRELATION: 0.2

## 2023-08-27 LAB — PROTIME-INR
INR: 0.94
PROTIME: 10.6 s (ref 9.9–12.6)

## 2023-08-27 LAB — EBV QUANTITATIVE PCR, BLOOD: EBV VIRAL LOAD RESULT: NOT DETECTED

## 2023-08-27 LAB — PHOSPHORUS: PHOSPHORUS: 3.1 mg/dL (ref 2.4–5.1)

## 2023-08-27 LAB — CMV DNA, QUANTITATIVE, PCR: CMV VIRAL LD: NOT DETECTED

## 2023-08-27 MED ADMIN — cholecalciferol (vitamin D3 25 mcg (1,000 units)) tablet 25 mcg: 25 ug | ORAL | @ 13:00:00

## 2023-08-27 MED ADMIN — mycophenolate (CELLCEPT) tablet 1,000 mg: 1000 mg | ORAL | @ 13:00:00

## 2023-08-27 MED ADMIN — amlodipine (NORVASC) tablet 5 mg: 5 mg | ORAL | @ 13:00:00

## 2023-08-27 MED ADMIN — fluconazole (DIFLUCAN) tablet 400 mg: 400 mg | ORAL | @ 13:00:00 | Stop: 2023-09-21

## 2023-08-27 MED ADMIN — pantoprazole (Protonix) EC tablet 40 mg: 40 mg | ORAL | @ 13:00:00

## 2023-08-27 MED ADMIN — magnesium sulfate 2gm/50mL IVPB: 2 g | INTRAVENOUS | @ 06:00:00

## 2023-08-27 MED ADMIN — calcium carbonate (TUMS) chewable tablet 400 mg elem calcium: 400 mg | ORAL | @ 01:00:00

## 2023-08-27 MED ADMIN — cyanocobalamin (vitamin B-12) tablet 2,000 mcg: 2000 ug | ORAL | @ 13:00:00

## 2023-08-27 MED ADMIN — levoFLOXacin (LEVAQUIN) tablet 500 mg: 500 mg | ORAL | @ 13:00:00 | Stop: 2023-09-05

## 2023-08-27 MED ADMIN — furosemide (LASIX) injection 20 mg: 20 mg | INTRAVENOUS | @ 15:00:00 | Stop: 2023-08-27

## 2023-08-27 MED ADMIN — guaiFENesin (ROBITUSSIN) oral syrup: 200 mg | ORAL | @ 01:00:00

## 2023-08-27 MED ADMIN — potassium chloride 20 mEq in 100 mL IVPB Premix: 20 meq | INTRAVENOUS | @ 10:00:00 | Stop: 2024-08-15

## 2023-08-27 MED ADMIN — valACYclovir (VALTREX) tablet 500 mg: 500 mg | ORAL | @ 13:00:00

## 2023-08-27 MED ADMIN — loratadine (CLARITIN) tablet 10 mg: 10 mg | ORAL | @ 13:00:00

## 2023-08-27 MED ADMIN — heparin, porcine (PF) 100 unit/mL injection 2 mL: 2 mL | INTRAVENOUS | @ 04:00:00

## 2023-08-27 MED ADMIN — calcium carbonate (TUMS) chewable tablet 400 mg elem calcium: 400 mg | ORAL | @ 19:00:00

## 2023-08-27 MED ADMIN — calcium carbonate (TUMS) chewable tablet 400 mg elem calcium: 400 mg | ORAL | @ 10:00:00

## 2023-08-27 MED ADMIN — mycophenolate (CELLCEPT) tablet 1,000 mg: 1000 mg | ORAL | @ 19:00:00

## 2023-08-27 MED ADMIN — potassium chloride 20 mEq in 100 mL IVPB Premix: 20 meq | INTRAVENOUS | @ 08:00:00 | Stop: 2024-08-15

## 2023-08-27 MED ADMIN — sodium chloride (NS) 0.9 % infusion: 100 mL/h | INTRAVENOUS | @ 08:00:00 | Stop: 2023-08-29

## 2023-08-27 MED ADMIN — metoPROLOL succinate (Toprol-XL) 24 hr tablet 50 mg: 50 mg | ORAL | @ 13:00:00

## 2023-08-27 MED ADMIN — valACYclovir (VALTREX) tablet 500 mg: 500 mg | ORAL | @ 01:00:00

## 2023-08-27 MED ADMIN — tacrolimus (PROGRAF) capsule 5.5 mg: .045 mg/kg | ORAL | @ 13:00:00 | Stop: 2023-08-29

## 2023-08-27 NOTE — Unmapped (Signed)
Bone Marrow Transplant and Cellular Therapy Program  Inpatient Progress Note    Patient Name: Timothy Porter  MRN: [id number]  Encounter Date: 08/27/23    [facility] Myeloma Physician: Dr Vivien Presto Leukemia Physician: Dr. [name]  BMT Attending MD: 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  Type of Transplant: RIC MRD Allo  Graft Source: Fresh PBSCs  Transplant Day: 1551 (Autologous PBSC Unknown Phase - Planned 05/29/2019), 5 (Allogeneic PBSC Unknown Phase - Planned 08/22/2023)       Interval History:  - No acute events overnight  - Denies N/V Good PO intake 1.5L yesterday. Eating as tolerated.  - BM remain loose 2 this AM which is normal for him  - Weight change: 2.041 kg (4 lb 8 oz) Trace dependent edema. Given increase in weight will give 20 mg of lasix today.   - Chronic cough improved with Flonase and PRN robitussin  - AST/ ALT stable and continue to improve  - Afebrile and VSS  - Completed PTCy yest will continue IVF for 48 hours and F/U daily UA    Review of Systems:  A comprehensive ROS performed and is negative except for pertinent positives as listed above in interval history.     Objective:  Wt Readings from Last 1 Encounters:   08/26/23 (!) 128.7 kg (283 lb 12.8 oz)     Temp Readings from Last 1 Encounters:   08/27/23 36.4 ??C (97.5 ??F) (Oral)     BP Readings from Last 1 Encounters:   08/27/23 140/81     Pulse Readings from Last 1 Encounters:   08/27/23 94     SpO2 Readings from Last 1 Encounters:   08/27/23 98%     80, Normal activity with effort; some signs or symptoms of disease (ECOG equivalent 1)    Physical Exam:   General: Sitting up in chair, no acute distress noted.   Central venous access: Line clean, dry, intact. No erythema or drainage noted.   ENT: Moist mucous membranes. Oropharhynx without lesions, erythema or exudate.   Cardiovascular: Pulse normal rate, regularity and rhythm. S1 and S2 normal, without any murmur, rub, or gallop.  Lungs: Clear to auscultation bilaterally, without wheezes/crackles/rhonchi. Good air movement.   Skin: Warm, dry, intact. No rash noted.    Psychiatry: Alert and oriented to person, place, and time.   Gastrointestinal/Abdomen: Normoactive bowel sounds, abdomen soft, non-tender   Musculoskeletal/Extremities: FROM throughout. +1 BLE edema  Neurologic: CNII-XII intact. Normal strength and sensation throughout    Test Results:   I reviewed all labs from today in Epic. See EMR for lab results.    Assessment/Plan:    BMT: AML with prior history of MM. 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  RIC Flu/Bu Conditioning. Regimen Consists of:  1. Fludarabine 30 mg/m2 days -6, -5, -4, -3, -2  2. Busulfan 3.2 mg/kg days -5, -4    Donor: 8/8, ABO O+, CMV positive  Recipient A+ and CMV+  Engraftment: G-CSF starting Day + 5 through WBC recovery (as defined as ANC 1.0 x 2 days or 3.0 x 1 day)  -Date of last G-CSF injection: TBD    GVHD prophylaxis:   1. Cyclophosphamide 50 mg/kg IV daily on days +3 and +4 with mesna  2. Tacrolimus to goal trough (5-10 ng/mL) starting day +5  3. Mycophenolate mofetil 15 mg/kg  PO TID (max 1g PO TID) starting day +5 through day +35     Hem:   Transfusion criteria: Transfuse 1 unit of PRBCs for hemoglobin <7 and 1 unit of platelets for Plt <10K or bleeding. [name]. Kruschke does not have a history of transfusion reactions. Consent was obtained and documented on 08/15/23.    ID:   Prophylaxis:  -Antiviral: Valtrex 500 mg po, increased to BID on admit and continue through 2 years post transplant                   Letermovir 480 mg po to start at day +15 or hospital discharge and continue through day +200  -Antifungal: Fluconazole 400 mg po daily, continue through day +75  -Antibacterial: Levaquin 500 mg po daily until no longer neutropenic  -PJP: Bactrim DS once daily MWF upon platelet engraftment.     GI:   GERD prophylaxis:  -Home med: Prilosec 40 mg daily. Will switch to pantoprazole while inpatient    Nausea:  -Anti-emetics per BMT protocol.     Hx of IBS with chronic alternating diarrhea/constipation  -Has managed with changing doses of Questran; increased to 1 packet BID in 02/2023 with improvement of diarrhea; self tapered to 0.5 packets BID  -Reports predominantly constipation concerns but would have occasional accidents; says he hasn't soiled himself in months with current dose  -Uses docusate only if no BM 3-4 days  -Meticulously self tracks and titrates medication  -Continue Questran dosing on admission with prn docusate; likely will need to hold if he develops diarrhea    Diarrhea:  -Antidiarrheals-see above    Renal: Creatinine normal. No issues.   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 SOS prophylaxis not indicated for RIC regimen.     CV:   -Hx of A-fib with transplant in 2020; required diltiazem+metoprolol; resolved post acute episode     * Continues on metoprolol mainly for BP.      * Plan for cardiac electrolytes    -HTN: has discontinued diltiazem prior to the start of venetoclax (for DDI) currently managed with metoprolol monotherapy; decreased to 50 mg/day ~1-2 months ago  -9/20: Adding norvasc 5mg  daily for persistent HTN    Pulm: No current issues.    ENT:  - Chronic cough when supine from PND? Try flonase or saline spray    GU:   Hx of BPH, previously on flomax but per patient not taking.   Hold sildenafil (for ED)    Endocrine:  Hx of steroid induced hyperglycemia not currently on medications    Neuro/Pain: No issues     Psych: CCSP consult prn.     Deconditioning:  - PT/OT consulted  - Uses a cane for walking.       Azalee Course Monicia Tse, FNP  Bone Marrow Transplant and Cellular Therapy Program

## 2023-08-27 NOTE — Unmapped (Signed)
Patient day +5 from allo MUD SCT. Mesna completed and taken down, NS fluids running continuous per orders. 20 mg IV lasix given. No report of pain or nausea this shift.  Problem: Adult Inpatient Plan of Care  Goal: Plan of Care Review  Outcome: Progressing  Goal: Patient-Specific Goal (Individualized)  Outcome: Progressing  Goal: Absence of Hospital-Acquired Illness or Injury  Outcome: Progressing  Intervention: Identify and Manage Fall Risk  Recent Flowsheet Documentation  Taken 08/27/2023 0845 by Janyth Pupa, RN  Safety Interventions:   bleeding precautions   fall reduction program maintained   assistive device   infection management   lighting adjusted for tasks/safety   low bed   neutropenic precautions   nonskid shoes/slippers when out of bed  Intervention: Prevent Skin Injury  Recent Flowsheet Documentation  Taken 08/27/2023 0845 by Janyth Pupa, RN  Positioning for Skin: Sitting in Chair  Device Skin Pressure Protection: adhesive use limited  Skin Protection: adhesive use limited  Intervention: Prevent and Manage VTE (Venous Thromboembolism) Risk  Recent Flowsheet Documentation  Taken 08/27/2023 0930 by Janyth Pupa, RN  VTE Prevention/Management:   ambulation promoted   bleeding precautions maintained   bleeding risk factors identified   fluids promoted  Anti-Embolism Intervention: Off  Intervention: Prevent Infection  Recent Flowsheet Documentation  Taken 08/27/2023 0845 by Janyth Pupa, RN  Infection Prevention:   cohorting utilized   environmental surveillance performed   equipment surfaces disinfected   hand hygiene promoted   personal protective equipment utilized   rest/sleep promoted   single patient room provided   visitors restricted/screened  Goal: Optimal Comfort and Wellbeing  Outcome: Progressing  Goal: Readiness for Transition of Care  Outcome: Progressing  Goal: Rounds/Family Conference  Outcome: Progressing     Problem: Infection  Goal: Absence of Infection Signs and Symptoms  Outcome: Progressing  Intervention: Prevent or Manage Infection  Recent Flowsheet Documentation  Taken 08/27/2023 0845 by Janyth Pupa, RN  Infection Management: aseptic technique maintained  Isolation Precautions: protective precautions maintained     Problem: Fall Injury Risk  Goal: Absence of Fall and Fall-Related Injury  Outcome: Progressing  Intervention: Identify and Manage Contributors  Recent Flowsheet Documentation  Taken 08/27/2023 0930 by Janyth Pupa, RN  Self-Care Promotion: independence encouraged  Intervention: Promote Injury-Free Environment  Recent Flowsheet Documentation  Taken 08/27/2023 0845 by Janyth Pupa, RN  Safety Interventions:   bleeding precautions   fall reduction program maintained   assistive device   infection management   lighting adjusted for tasks/safety   low bed   neutropenic precautions   nonskid shoes/slippers when out of bed     Problem: Fall Injury Risk  Goal: Absence of Fall and Fall-Related Injury  Outcome: Progressing  Intervention: Identify and Manage Contributors  Recent Flowsheet Documentation  Taken 08/27/2023 0930 by Janyth Pupa, RN  Self-Care Promotion: independence encouraged  Intervention: Promote Injury-Free Environment  Recent Flowsheet Documentation  Taken 08/27/2023 0845 by Janyth Pupa, RN  Safety Interventions:   bleeding precautions   fall reduction program maintained   assistive device   infection management   lighting adjusted for tasks/safety   low bed   neutropenic precautions   nonskid shoes/slippers when out of bed     Problem: Comorbidity Management  Goal: Blood Pressure in Desired Range  Outcome: Progressing

## 2023-08-27 NOTE — Unmapped (Signed)
Patient is day +5 from Allo MRD SCT. Patient is A&O x 4. Vital signs stable and patient remained afebrile throughout shift. PRNs given throughout the shift included robitussin for coughing and potassium and magnesium per protocol. No acute events throughout the shift. Plan of care reviewed with patient. Safety measures present throughout shift.     Problem: Adult Inpatient Plan of Care  Goal: Plan of Care Review  Outcome: Ongoing - Unchanged  Goal: Patient-Specific Goal (Individualized)  Outcome: Ongoing - Unchanged  Goal: Absence of Hospital-Acquired Illness or Injury  Outcome: Ongoing - Unchanged  Intervention: Identify and Manage Fall Risk  Recent Flowsheet Documentation  Taken 08/26/2023 2030 by Concha Se, RN  Safety Interventions:   bleeding precautions   fall reduction program maintained   infection management   isolation precautions   lighting adjusted for tasks/safety   low bed   neutropenic precautions   nonskid shoes/slippers when out of bed  Intervention: Prevent Skin Injury  Recent Flowsheet Documentation  Taken 08/26/2023 2030 by Concha Se, RN  Positioning for Skin: Supine/Back  Device Skin Pressure Protection:   adhesive use limited   tubing/devices free from skin contact  Skin Protection:   adhesive use limited   transparent dressing maintained   tubing/devices free from skin contact  Intervention: Prevent and Manage VTE (Venous Thromboembolism) Risk  Recent Flowsheet Documentation  Taken 08/26/2023 2030 by Concha Se, RN  VTE Prevention/Management:   ambulation promoted   fluids promoted   intravenous hydration  Intervention: Prevent Infection  Recent Flowsheet Documentation  Taken 08/26/2023 2030 by Concha Se, RN  Infection Prevention:   equipment surfaces disinfected   hand hygiene promoted   personal protective equipment utilized   rest/sleep promoted   single patient room provided  Goal: Optimal Comfort and Wellbeing  Outcome: Ongoing - Unchanged  Goal: Readiness for Transition of Care  Outcome: Ongoing - Unchanged  Goal: Rounds/Family Conference  Outcome: Ongoing - Unchanged     Problem: Infection  Goal: Absence of Infection Signs and Symptoms  Outcome: Ongoing - Unchanged  Intervention: Prevent or Manage Infection  Recent Flowsheet Documentation  Taken 08/26/2023 2030 by Concha Se, RN  Infection Management: aseptic technique maintained  Isolation Precautions: protective precautions maintained     Problem: Fall Injury Risk  Goal: Absence of Fall and Fall-Related Injury  Outcome: Ongoing - Unchanged  Intervention: Identify and Manage Contributors  Recent Flowsheet Documentation  Taken 08/26/2023 2030 by Concha Se, RN  Self-Care Promotion: independence encouraged  Intervention: Promote Injury-Free Environment  Recent Flowsheet Documentation  Taken 08/26/2023 2030 by Concha Se, RN  Safety Interventions:   bleeding precautions   fall reduction program maintained   infection management   isolation precautions   lighting adjusted for tasks/safety   low bed   neutropenic precautions   nonskid shoes/slippers when out of bed     Problem: Fall Injury Risk  Goal: Absence of Fall and Fall-Related Injury  Outcome: Ongoing - Unchanged  Intervention: Identify and Manage Contributors  Recent Flowsheet Documentation  Taken 08/26/2023 2030 by Concha Se, RN  Self-Care Promotion: independence encouraged  Intervention: Promote Injury-Free Environment  Recent Flowsheet Documentation  Taken 08/26/2023 2030 by Concha Se, RN  Safety Interventions:   bleeding precautions   fall reduction program maintained   infection management   isolation precautions   lighting adjusted for tasks/safety   low bed   neutropenic precautions   nonskid shoes/slippers when out of bed  Problem: Comorbidity Management  Goal: Blood Pressure in Desired Range  Outcome: Ongoing - Unchanged

## 2023-08-27 NOTE — Unmapped (Signed)
Patient AOx4, VSS, afebrile. Patient received second dose of Cytoxan per orders with no issue. Patient did not receive any PRN medications this shift. No falls or acute events this shift. Plan of Care reviewed with patient.  Safety measures in place of bed low with brakes locked, non-skid footwear on while out of bed, call bell within reach.        Problem: Adult Inpatient Plan of Care  Goal: Plan of Care Review  Outcome: Ongoing - Unchanged  Goal: Patient-Specific Goal (Individualized)  Outcome: Ongoing - Unchanged  Goal: Absence of Hospital-Acquired Illness or Injury  Outcome: Ongoing - Unchanged  Intervention: Identify and Manage Fall Risk  Recent Flowsheet Documentation  Taken 08/26/2023 0717 by Arizona Constable, RN  Safety Interventions:   bleeding precautions   chemotherapeutic agent precautions   commode/urinal/bedpan at bedside   environmental modification   fall reduction program maintained   infection management   isolation precautions   lighting adjusted for tasks/safety   low bed   neutropenic precautions   nonskid shoes/slippers when out of bed  Intervention: Prevent Skin Injury  Recent Flowsheet Documentation  Taken 08/26/2023 0833 by Arizona Constable, RN  Positioning for Skin: Sitting in Chair  Taken 08/26/2023 6045 by Arizona Constable, RN  Positioning for Skin: Sitting in Chair  Intervention: Prevent Infection  Recent Flowsheet Documentation  Taken 08/26/2023 4098 by Arizona Constable, RN  Infection Prevention: cohorting utilized  Goal: Optimal Comfort and Wellbeing  Outcome: Ongoing - Unchanged  Goal: Readiness for Transition of Care  Outcome: Ongoing - Unchanged  Goal: Rounds/Family Conference  Outcome: Ongoing - Unchanged     Problem: Infection  Goal: Absence of Infection Signs and Symptoms  Outcome: Ongoing - Unchanged  Intervention: Prevent or Manage Infection  Recent Flowsheet Documentation  Taken 08/26/2023 0717 by Arizona Constable, RN  Infection Management: aseptic technique maintained  Isolation Precautions: protective precautions maintained     Problem: Fall Injury Risk  Goal: Absence of Fall and Fall-Related Injury  Outcome: Ongoing - Unchanged  Intervention: Promote Injury-Free Environment  Recent Flowsheet Documentation  Taken 08/26/2023 1191 by Arizona Constable, RN  Safety Interventions:   bleeding precautions   chemotherapeutic agent precautions   commode/urinal/bedpan at bedside   environmental modification   fall reduction program maintained   infection management   isolation precautions   lighting adjusted for tasks/safety   low bed   neutropenic precautions   nonskid shoes/slippers when out of bed     Problem: Fall Injury Risk  Goal: Absence of Fall and Fall-Related Injury  Outcome: Ongoing - Unchanged  Intervention: Promote Injury-Free Environment  Recent Flowsheet Documentation  Taken 08/26/2023 4782 by Arizona Constable, RN  Safety Interventions:   bleeding precautions   chemotherapeutic agent precautions   commode/urinal/bedpan at bedside   environmental modification   fall reduction program maintained   infection management   isolation precautions   lighting adjusted for tasks/safety   low bed   neutropenic precautions   nonskid shoes/slippers when out of bed     Problem: Comorbidity Management  Goal: Blood Pressure in Desired Range  Outcome: Ongoing - Unchanged

## 2023-08-27 NOTE — Unmapped (Signed)
 [facility] Specialty Medication Onboarding    Specialty Medication: PREVYMIS 480 mg tablet (letermovir)  Prior Authorization: Approved   Financial Assistance: No - copay  <$25  Final Copay/Day Supply: $0 / 28    Insurance Restrictions: None     Notes to Pharmacist:   Credit Card on File: not applicable    The triage team has completed the benefits investigation and has determined that the patient is able to fill this medication at [facility]. Please contact the patient to complete the onboarding or follow up with the prescribing physician as needed.

## 2023-08-28 LAB — BASIC METABOLIC PANEL
ANION GAP: 4 mmol/L — ABNORMAL LOW (ref 5–14)
BLOOD UREA NITROGEN: 16 mg/dL (ref 9–23)
BUN / CREAT RATIO: 22
CALCIUM: 8.7 mg/dL (ref 8.7–10.4)
CHLORIDE: 110 mmol/L — ABNORMAL HIGH (ref 98–107)
CO2: 29 mmol/L (ref 20.0–31.0)
CREATININE: 0.72 mg/dL — ABNORMAL LOW
EGFR CKD-EPI (2021) MALE: >90 mL/min/{1.73_m2} (ref >=60)
GLUCOSE RANDOM: 134 mg/dL (ref 70–179)
POTASSIUM: 3.8 mmol/L (ref 3.4–4.8)
SODIUM: 143 mmol/L (ref 135–145)

## 2023-08-28 LAB — MAGNESIUM: MAGNESIUM: 1.9 mg/dL (ref 1.6–2.6)

## 2023-08-28 MED ADMIN — magnesium sulfate 2gm/50mL IVPB: 2 g | INTRAVENOUS | @ 10:00:00

## 2023-08-28 MED ADMIN — calcium carbonate (TUMS) chewable tablet 400 mg elem calcium: 400 mg | ORAL | @ 01:00:00

## 2023-08-28 MED ADMIN — calcium carbonate (TUMS) chewable tablet 400 mg elem calcium: 400 mg | ORAL | @ 10:00:00

## 2023-08-28 MED ADMIN — potassium chloride 20 mEq in 100 mL IVPB Premix: 20 meq | INTRAVENOUS | @ 10:00:00 | Stop: 2024-08-15

## 2023-08-28 MED ADMIN — pantoprazole (Protonix) EC tablet 40 mg: 40 mg | ORAL | @ 12:00:00

## 2023-08-28 MED ADMIN — valACYclovir (VALTREX) tablet 500 mg: 500 mg | ORAL | @ 12:00:00

## 2023-08-28 MED ADMIN — calcium carbonate (TUMS) chewable tablet 400 mg elem calcium: 400 mg | ORAL | @ 18:00:00

## 2023-08-28 MED ADMIN — filgrastim-ayow (RELEUKO) injection syringe 480 mcg: 480 ug | SUBCUTANEOUS | @ 01:00:00

## 2023-08-28 MED ADMIN — levoFLOXacin (LEVAQUIN) tablet 500 mg: 500 mg | ORAL | @ 12:00:00 | Stop: 2023-09-05

## 2023-08-28 MED ADMIN — fluconazole (DIFLUCAN) tablet 400 mg: 400 mg | ORAL | @ 12:00:00 | Stop: 2023-09-21

## 2023-08-28 MED ADMIN — mycophenolate (CELLCEPT) tablet 1,000 mg: 1000 mg | ORAL | @ 18:00:00

## 2023-08-28 MED ADMIN — mycophenolate (CELLCEPT) tablet 1,000 mg: 1000 mg | ORAL | @ 01:00:00

## 2023-08-28 MED ADMIN — valACYclovir (VALTREX) tablet 500 mg: 500 mg | ORAL | @ 01:00:00

## 2023-08-28 MED ADMIN — tacrolimus (PROGRAF) capsule 5.5 mg: .045 mg/kg | ORAL | @ 12:00:00 | Stop: 2023-08-28

## 2023-08-28 MED ADMIN — cyanocobalamin (vitamin B-12) tablet 2,000 mcg: 2000 ug | ORAL | @ 12:00:00

## 2023-08-28 MED ADMIN — sodium chloride (NS) 0.9 % infusion: 100 mL/h | INTRAVENOUS | @ 03:00:00 | Stop: 2023-08-29

## 2023-08-28 MED ADMIN — loratadine (CLARITIN) tablet 10 mg: 10 mg | ORAL | @ 12:00:00

## 2023-08-28 MED ADMIN — mycophenolate (CELLCEPT) tablet 1,000 mg: 1000 mg | ORAL | @ 12:00:00

## 2023-08-28 MED ADMIN — metoPROLOL succinate (Toprol-XL) 24 hr tablet 50 mg: 50 mg | ORAL | @ 12:00:00

## 2023-08-28 MED ADMIN — amlodipine (NORVASC) tablet 5 mg: 5 mg | ORAL | @ 12:00:00

## 2023-08-28 MED ADMIN — tacrolimus (PROGRAF) capsule 5.5 mg: .045 mg/kg | ORAL | @ 01:00:00 | Stop: 2023-08-29

## 2023-08-28 MED ADMIN — cholecalciferol (vitamin D3 25 mcg (1,000 units)) tablet 25 mcg: 25 ug | ORAL | @ 12:00:00

## 2023-08-28 MED ADMIN — fluticasone propionate (FLONASE) 50 mcg/actuation nasal spray 2 spray: 2 | NASAL | @ 13:00:00

## 2023-08-28 NOTE — Unmapped (Signed)
Patient Timothy Porter, VSS, afebrile. Denies pain. Patient requests to NOT have any chaplains come to his room. No PRNs given. AMLs drawn. No blood products needed. Mag 2g and K+ needed overnight. No acute events overnight. Plan of Care reviewed with patient.  Safety measures in place of bed low with brakes locked, non-skid footwear on while out of bed, call bell within reach.      Problem: Adult Inpatient Plan of Care  Goal: Plan of Care Review  Outcome: Ongoing - Unchanged  Goal: Patient-Specific Goal (Individualized)  Outcome: Ongoing - Unchanged  Goal: Absence of Hospital-Acquired Illness or Injury  Outcome: Ongoing - Unchanged  Intervention: Identify and Manage Fall Risk  Recent Flowsheet Documentation  Taken 08/27/2023 2032 by Marcelle Smiling, RN  Safety Interventions:   bleeding precautions   fall reduction program maintained   infection management   isolation precautions   lighting adjusted for tasks/safety   low bed   neutropenic precautions   nonskid shoes/slippers when out of bed  Intervention: Prevent Infection  Recent Flowsheet Documentation  Taken 08/27/2023 2032 by Marcelle Smiling, RN  Infection Prevention:   equipment surfaces disinfected   hand hygiene promoted   personal protective equipment utilized   rest/sleep promoted   single patient room provided   visitors restricted/screened  Goal: Optimal Comfort and Wellbeing  Outcome: Ongoing - Unchanged  Goal: Readiness for Transition of Care  Outcome: Ongoing - Unchanged  Goal: Rounds/Family Conference  Outcome: Ongoing - Unchanged     Problem: Infection  Goal: Absence of Infection Signs and Symptoms  Outcome: Ongoing - Unchanged  Intervention: Prevent or Manage Infection  Recent Flowsheet Documentation  Taken 08/27/2023 2032 by Marcelle Smiling, RN  Infection Management: aseptic technique maintained  Isolation Precautions: protective precautions maintained     Problem: Fall Injury Risk  Goal: Absence of Fall and Fall-Related Injury  Outcome: Ongoing - Unchanged  Intervention: Promote Injury-Free Environment  Recent Flowsheet Documentation  Taken 08/27/2023 2032 by Marcelle Smiling, RN  Safety Interventions:   bleeding precautions   fall reduction program maintained   infection management   isolation precautions   lighting adjusted for tasks/safety   low bed   neutropenic precautions   nonskid shoes/slippers when out of bed     Problem: Fall Injury Risk  Goal: Absence of Fall and Fall-Related Injury  Outcome: Ongoing - Unchanged  Intervention: Promote Injury-Free Environment  Recent Flowsheet Documentation  Taken 08/27/2023 2032 by Marcelle Smiling, RN  Safety Interventions:   bleeding precautions   fall reduction program maintained   infection management   isolation precautions   lighting adjusted for tasks/safety   low bed   neutropenic precautions   nonskid shoes/slippers when out of bed     Problem: Comorbidity Management  Goal: Blood Pressure in Desired Range  Outcome: Ongoing - Unchanged

## 2023-08-28 NOTE — Unmapped (Signed)
Bone Marrow Transplant and Cellular Therapy Program  Inpatient Progress Note    Patient Name: Timothy Porter  MRN: [id number]  Encounter Date: 08/28/23    [facility] Myeloma Physician: Dr Vivien Presto Leukemia Physician: Dr. [name]  BMT Attending MD: 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 Eating as tolerated. Had documented yesterday although the patient reports getting in about a liter.  - BM remain loose which is normal for him. Had 3 BM's in the last 24 hours. He would like his Cholestyramine as needed.   - Weight change: -1.588 kg (-3 lb 8 oz)   - Will stop c.IVF today and monitor for hematuria.   - Afebrile. BP elevated, if BP remains elevated after stopping IVF will consider increasing Norvasc.     Review of Systems:  A comprehensive ROS performed and is negative except for pertinent positives as listed above in interval history.     Objective:  Wt Readings from Last 1 Encounters:   08/27/23 (!) 127.1 kg (280 lb 4.8 oz)     Temp Readings from Last 1 Encounters:   08/28/23 36.5 ??C (97.7 ??F) (Oral)     BP Readings from Last 1 Encounters:   08/28/23 149/84     Pulse Readings from Last 1 Encounters:   08/28/23 93     SpO2 Readings from Last 1 Encounters:   08/28/23 97%     80, Normal activity with effort; some signs or symptoms of disease (ECOG equivalent 1)    Physical Exam:   General: Sitting up in bed, no acute distress noted.   Central venous access: Line clean, dry, intact. No erythema or drainage noted.   ENT: Moist mucous membranes. Oropharhynx without lesions, erythema or exudate.   Cardiovascular: Pulse normal rate, regularity and rhythm. S1 and S2 normal, without any murmur, rub, or gallop.  Lungs: Clear to auscultation bilaterally, without wheezes/crackles/rhonchi. Good air movement.   Skin: Warm, dry, intact. No rash noted.    Psychiatry: Alert and oriented to person, place, and time.   Gastrointestinal/Abdomen: Normoactive bowel sounds, abdomen soft, non-tender   Musculoskeletal/Extremities: FROM throughout. No edema.  Neurologic: CNII-XII intact. Normal strength and sensation throughout    Test Results:   I reviewed all labs from today in Epic. See EMR for lab results.    Assessment/Plan:    BMT: AML with prior history of MM. 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  RIC Flu/Bu Conditioning. Regimen Consists of:  1. Fludarabine 30 mg/m2 days -6, -5, -4, -3, -2  2. Busulfan 3.2 mg/kg days -5, -4    Donor: 8/8, ABO O+, CMV positive  Recipient A+ and CMV+  Engraftment: G-CSF starting Day + 5 through WBC recovery (as defined as ANC 1.0 x 2 days or 3.0 x 1 day)  -Date of last G-CSF injection: TBD    GVHD prophylaxis:   1. Cyclophosphamide 50 mg/kg IV daily on days +3 and +4 with mesna  2. Tacrolimus to goal trough (5-10 ng/mL) starting day +5  3. Mycophenolate mofetil 15 mg/kg PO TID (max 1g PO TID) starting  day +5 through day +35     Hem:   Transfusion criteria: Transfuse 1 unit of PRBCs for hemoglobin <7 and 1 unit of platelets for Plt <10K or bleeding. [name]. [place] does not have a history of transfusion reactions. Consent was obtained and documented on 08/15/23.    ID:   Prophylaxis:  -Antiviral: Valtrex 500 mg po, increased to BID on admit and continue through 2 years post transplant                   Letermovir 480 mg po to start at day +15 or hospital discharge and continue through day +200  -Antifungal: Fluconazole 400 mg po daily, continue through day +75  -Antibacterial: Levaquin 500 mg po daily until no longer neutropenic  -PJP: Bactrim DS once daily MWF upon platelet engraftment.     GI:   GERD prophylaxis:  -Home med: Prilosec 40 mg daily. Will switch to pantoprazole while inpatient    Nausea:  -Anti-emetics per BMT protocol.     Hx of IBS with chronic alternating diarrhea/constipation  -Has managed with changing doses of Questran; increased to 1 packet BID in 02/2023 with improvement of diarrhea; self tapered to 0.5 packets BID  -Reports predominantly constipation concerns but would have occasional accidents; says he hasn't soiled himself in months with current dose  -Uses docusate only if no BM 3-4 days  -Meticulously self tracks and titrates medication  -Continue Questran dosing on admission with prn docusate; likely will need to hold if he develops diarrhea  -10/1: Changing Questran to PRN    Diarrhea:  -Antidiarrheals-see above    Renal: Creatinine normal. No issues.   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 SOS prophylaxis not indicated for RIC regimen.     CV:   -Hx of A-fib with transplant in 2020; required diltiazem+metoprolol; resolved post acute episode     * Continues on metoprolol mainly for BP.      * Plan for cardiac electrolytes    -HTN: has discontinued diltiazem prior to the start of venetoclax (for DDI) currently managed with metoprolol monotherapy; decreased to 50 mg/day ~1-2 months ago  -9/20: Adding norvasc 5mg  daily for persistent HTN    Pulm: No current issues.    ENT:  - Chronic cough when supine from PND? Try flonase or saline spray    GU:   Hx of BPH, previously on flomax but per patient not taking.   Hold sildenafil (for ED)    Endocrine:  Hx of steroid induced hyperglycemia not currently on medications    Neuro/Pain: No issues     Psych: CCSP consult prn.     Deconditioning:  - PT/OT consulted  - Uses a cane for walking.       Gretchen Portela, FNP  Bone [facility]row Trans[facility] Cellular Therapy Program

## 2023-08-29 LAB — BASIC METABOLIC PANEL
ANION GAP: 4 mmol/L — ABNORMAL LOW (ref 5–14)
BLOOD UREA NITROGEN: 14 mg/dL (ref 9–23)
BUN / CREAT RATIO: 20
CALCIUM: 8.4 mg/dL — ABNORMAL LOW (ref 8.7–10.4)
CHLORIDE: 107 mmol/L (ref 98–107)
CO2: 28 mmol/L (ref 20.0–31.0)
CREATININE: 0.69 mg/dL — ABNORMAL LOW
EGFR CKD-EPI (2021) MALE: >90 mL/min/{1.73_m2} (ref >=60)
GLUCOSE RANDOM: 121 mg/dL (ref 70–179)
POTASSIUM: 3.4 mmol/L (ref 3.4–4.8)
SODIUM: 139 mmol/L (ref 135–145)

## 2023-08-29 LAB — CBC W/ AUTO DIFF
BASOPHILS ABSOLUTE COUNT: 0 10*9/L (ref 0.0–0.1)
BASOPHILS RELATIVE PERCENT: 0.1 %
EOSINOPHILS ABSOLUTE COUNT: 0 10*9/L (ref 0.0–0.5)
EOSINOPHILS RELATIVE PERCENT: 0 %
HEMATOCRIT: 25.8 % — ABNORMAL LOW (ref 39.0–48.0)
HEMOGLOBIN: 9.5 g/dL — ABNORMAL LOW (ref 12.9–16.5)
LYMPHOCYTES ABSOLUTE COUNT: 0.1 10*9/L — ABNORMAL LOW (ref 1.1–3.6)
LYMPHOCYTES RELATIVE PERCENT: 3.3 %
MEAN CORPUSCULAR HEMOGLOBIN CONC: 36.9 g/dL — ABNORMAL HIGH (ref 32.0–36.0)
MEAN CORPUSCULAR HEMOGLOBIN: 35.9 pg — ABNORMAL HIGH (ref 25.9–32.4)
MEAN CORPUSCULAR VOLUME: 97.3 fL — ABNORMAL HIGH (ref 77.6–95.7)
MEAN PLATELET VOLUME: 7.9 fL (ref 6.8–10.7)
MONOCYTES ABSOLUTE COUNT: 0 10*9/L — ABNORMAL LOW (ref 0.3–0.8)
MONOCYTES RELATIVE PERCENT: 1.1 %
NEUTROPHILS ABSOLUTE COUNT: 2.7 10*9/L (ref 1.8–7.8)
NEUTROPHILS RELATIVE PERCENT: 95.5 %
PLATELET COUNT: 16 10*9/L — ABNORMAL LOW (ref 150–450)
RED BLOOD CELL COUNT: 2.65 10*12/L — ABNORMAL LOW (ref 4.26–5.60)
RED CELL DISTRIBUTION WIDTH: 14 % (ref 12.2–15.2)
WBC ADJUSTED: 2.8 10*9/L — ABNORMAL LOW (ref 3.6–11.2)

## 2023-08-29 LAB — MAGNESIUM: MAGNESIUM: 1.8 mg/dL (ref 1.6–2.6)

## 2023-08-29 LAB — TACROLIMUS LEVEL, TROUGH: TACROLIMUS, TROUGH: 4.4 ng/mL — ABNORMAL LOW (ref 5.0–15.0)

## 2023-08-29 MED ADMIN — loratadine (CLARITIN) tablet 10 mg: 10 mg | ORAL | @ 13:00:00

## 2023-08-29 MED ADMIN — cholecalciferol (vitamin D3 25 mcg (1,000 units)) tablet 25 mcg: 25 ug | ORAL | @ 13:00:00

## 2023-08-29 MED ADMIN — filgrastim-ayow (RELEUKO) injection syringe 480 mcg: 480 ug | SUBCUTANEOUS | @ 01:00:00

## 2023-08-29 MED ADMIN — metoPROLOL succinate (Toprol-XL) 24 hr tablet 50 mg: 50 mg | ORAL | @ 13:00:00

## 2023-08-29 MED ADMIN — fluconazole (DIFLUCAN) tablet 400 mg: 400 mg | ORAL | @ 13:00:00 | Stop: 2023-09-21

## 2023-08-29 MED ADMIN — mycophenolate (CELLCEPT) tablet 1,000 mg: 1000 mg | ORAL | @ 19:00:00

## 2023-08-29 MED ADMIN — calcium carbonate (TUMS) chewable tablet 400 mg elem calcium: 400 mg | ORAL | @ 19:00:00

## 2023-08-29 MED ADMIN — tacrolimus (PROGRAF) capsule 5.5 mg: .045 mg/kg | ORAL | @ 01:00:00 | Stop: 2023-08-28

## 2023-08-29 MED ADMIN — tacrolimus (PROGRAF) capsule 4 mg: .03 mg/kg | ORAL | @ 13:00:00

## 2023-08-29 MED ADMIN — fluticasone propionate (FLONASE) 50 mcg/actuation nasal spray 2 spray: 2 | NASAL | @ 13:00:00

## 2023-08-29 MED ADMIN — mycophenolate (CELLCEPT) tablet 1,000 mg: 1000 mg | ORAL | @ 13:00:00

## 2023-08-29 MED ADMIN — potassium chloride 20 mEq in 100 mL IVPB Premix: 20 meq | INTRAVENOUS | @ 09:00:00 | Stop: 2024-08-15

## 2023-08-29 MED ADMIN — pantoprazole (Protonix) EC tablet 40 mg: 40 mg | ORAL | @ 13:00:00

## 2023-08-29 MED ADMIN — valACYclovir (VALTREX) tablet 500 mg: 500 mg | ORAL | @ 13:00:00

## 2023-08-29 MED ADMIN — amlodipine (NORVASC) tablet 5 mg: 5 mg | ORAL | @ 13:00:00

## 2023-08-29 MED ADMIN — heparin, porcine (PF) 100 unit/mL injection 2 mL: 2 mL | INTRAVENOUS | @ 04:00:00

## 2023-08-29 MED ADMIN — levoFLOXacin (LEVAQUIN) tablet 500 mg: 500 mg | ORAL | @ 13:00:00 | Stop: 2023-09-05

## 2023-08-29 MED ADMIN — calcium carbonate (TUMS) chewable tablet 400 mg elem calcium: 400 mg | ORAL | @ 01:00:00

## 2023-08-29 MED ADMIN — mycophenolate (CELLCEPT) tablet 1,000 mg: 1000 mg | ORAL | @ 01:00:00

## 2023-08-29 MED ADMIN — calcium carbonate (TUMS) chewable tablet 400 mg elem calcium: 400 mg | ORAL | @ 11:00:00

## 2023-08-29 MED ADMIN — valACYclovir (VALTREX) tablet 500 mg: 500 mg | ORAL | @ 01:00:00

## 2023-08-29 MED ADMIN — potassium chloride 20 mEq in 100 mL IVPB Premix: 20 meq | INTRAVENOUS | @ 08:00:00 | Stop: 2024-08-15

## 2023-08-29 MED ADMIN — cyanocobalamin (vitamin B-12) tablet 2,000 mcg: 2000 ug | ORAL | @ 13:00:00

## 2023-08-29 MED ADMIN — magnesium sulfate 2gm/50mL IVPB: 2 g | INTRAVENOUS | @ 08:00:00

## 2023-08-29 NOTE — Unmapped (Signed)
Patient A&O X4, VSS, afebrile throughout shift. No PRNs given. No acute events throughout shift. Plan of care reviewed with patient and applicable visitors, safety measures in place throughout shift.       Problem: Adult Inpatient Plan of Care  Goal: Plan of Care Review  Outcome: Ongoing - Unchanged  Goal: Patient-Specific Goal (Individualized)  Outcome: Ongoing - Unchanged  Goal: Absence of Hospital-Acquired Illness or Injury  Outcome: Ongoing - Unchanged  Intervention: Identify and Manage Fall Risk  Recent Flowsheet Documentation  Taken 08/29/2023 0712 by Keene Breath, RN  Safety Interventions:   bleeding precautions   chemotherapeutic agent precautions   environmental modification   infection management   lighting adjusted for tasks/safety   low bed   nonskid shoes/slippers when out of bed   fall reduction program maintained  Intervention: Prevent Skin Injury  Recent Flowsheet Documentation  Taken 08/29/2023 0712 by Keene Breath, RN  Positioning for Skin: Supine/Back  Device Skin Pressure Protection:   tubing/devices free from skin contact   adhesive use limited  Skin Protection:   adhesive use limited   transparent dressing maintained   tubing/devices free from skin contact  Intervention: Prevent Infection  Recent Flowsheet Documentation  Taken 08/29/2023 0712 by Jamesetta Orleans T, RN  Infection Prevention:   cohorting utilized   environmental surveillance performed   equipment surfaces disinfected   hand hygiene promoted   personal protective equipment utilized   rest/sleep promoted   single patient room provided   visitors restricted/screened  Goal: Optimal Comfort and Wellbeing  Outcome: Ongoing - Unchanged  Goal: Readiness for Transition of Care  Outcome: Ongoing - Unchanged  Goal: Rounds/Family Conference  Outcome: Ongoing - Unchanged     Problem: Infection  Goal: Absence of Infection Signs and Symptoms  Outcome: Ongoing - Unchanged  Intervention: Prevent or Manage Infection  Recent Flowsheet Documentation  Taken 08/29/2023 0712 by Keene Breath, RN  Infection Management: aseptic technique maintained  Isolation Precautions: protective precautions maintained     Problem: Fall Injury Risk  Goal: Absence of Fall and Fall-Related Injury  Outcome: Ongoing - Unchanged  Intervention: Promote Injury-Free Environment  Recent Flowsheet Documentation  Taken 08/29/2023 0712 by Keene Breath, RN  Safety Interventions:   bleeding precautions   chemotherapeutic agent precautions   environmental modification   infection management   lighting adjusted for tasks/safety   low bed   nonskid shoes/slippers when out of bed   fall reduction program maintained     Problem: Fall Injury Risk  Goal: Absence of Fall and Fall-Related Injury  Outcome: Ongoing - Unchanged  Intervention: Promote Injury-Free Environment  Recent Flowsheet Documentation  Taken 08/29/2023 0712 by Keene Breath, RN  Safety Interventions:   bleeding precautions   chemotherapeutic agent precautions   environmental modification   infection management   lighting adjusted for tasks/safety   low bed   nonskid shoes/slippers when out of bed   fall reduction program maintained     Problem: Comorbidity Management  Goal: Blood Pressure in Desired Range  Outcome: Ongoing - Unchanged     Problem: Self-Care Deficit  Goal: Improved Ability to Complete Activities of Daily Living  Outcome: Ongoing - Unchanged

## 2023-08-29 NOTE — Unmapped (Signed)
Patient AOx4, VSS, afebrile. Denies pain/nausea. Patient having episodes of loose stools. AMLs drawn. Lines/claves changed. No blood products needed overnight. Mag 2g and K+ given for replacement overnight. Plan of Care reviewed with patient.  Safety measures in place of bed low with brakes locked, non-skid footwear on while out of bed, call bell within reach.      Problem: Adult Inpatient Plan of Care  Goal: Plan of Care Review  Outcome: Ongoing - Unchanged  Goal: Patient-Specific Goal (Individualized)  Outcome: Ongoing - Unchanged  Goal: Absence of Hospital-Acquired Illness or Injury  Outcome: Ongoing - Unchanged  Intervention: Identify and Manage Fall Risk  Recent Flowsheet Documentation  Taken 08/28/2023 2034 by Marcelle Smiling, RN  Safety Interventions:   bleeding precautions   fall reduction program maintained   infection management   isolation precautions   lighting adjusted for tasks/safety   low bed   neutropenic precautions   nonskid shoes/slippers when out of bed  Intervention: Prevent Infection  Recent Flowsheet Documentation  Taken 08/28/2023 2034 by Marcelle Smiling, RN  Infection Prevention:   equipment surfaces disinfected   hand hygiene promoted   personal protective equipment utilized   rest/sleep promoted   single patient room provided   visitors restricted/screened  Goal: Optimal Comfort and Wellbeing  Outcome: Ongoing - Unchanged  Goal: Readiness for Transition of Care  Outcome: Ongoing - Unchanged  Goal: Rounds/Family Conference  Outcome: Ongoing - Unchanged     Problem: Infection  Goal: Absence of Infection Signs and Symptoms  Outcome: Ongoing - Unchanged  Intervention: Prevent or Manage Infection  Recent Flowsheet Documentation  Taken 08/28/2023 2034 by Marcelle Smiling, RN  Infection Management: aseptic technique maintained  Isolation Precautions: protective precautions maintained     Problem: Fall Injury Risk  Goal: Absence of Fall and Fall-Related Injury  Outcome: Ongoing - Unchanged  Intervention: Promote Injury-Free Environment  Recent Flowsheet Documentation  Taken 08/28/2023 2034 by Marcelle Smiling, RN  Safety Interventions:   bleeding precautions   fall reduction program maintained   infection management   isolation precautions   lighting adjusted for tasks/safety   low bed   neutropenic precautions   nonskid shoes/slippers when out of bed     Problem: Fall Injury Risk  Goal: Absence of Fall and Fall-Related Injury  Outcome: Ongoing - Unchanged  Intervention: Promote Injury-Free Environment  Recent Flowsheet Documentation  Taken 08/28/2023 2034 by Marcelle Smiling, RN  Safety Interventions:   bleeding precautions   fall reduction program maintained   infection management   isolation precautions   lighting adjusted for tasks/safety   low bed   neutropenic precautions   nonskid shoes/slippers when out of bed     Problem: Comorbidity Management  Goal: Blood Pressure in Desired Range  Outcome: Ongoing - Unchanged     Problem: Self-Care Deficit  Goal: Improved Ability to Complete Activities of Daily Living  Outcome: Ongoing - Unchanged

## 2023-08-29 NOTE — Unmapped (Signed)
Bone Marrow Transplant and Cellular Therapy Program  Inpatient Progress Note    Patient Name: Timothy Porter  MRN: [id number]  Encounter Date: 08/29/23    [facility] Myeloma Physician: Dr Vivien Presto Leukemia Physician: Dr. [name]  BMT Attending MD: 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 Eating as tolerated. Drank 40 oz yesterday per report.  - BM remain loose which is normal for him. Had 2 BM's in the last 24 hours. He would like his Cholestyramine as needed. Due to fatigue is having a hard time making it to the bathroom in time at night, will request a commode.  - Weight change: -0.454 kg (-1 lb)   - Notes urinary hesitancy, will plan for a post void bladder scan (329 mL) adding Flomax which he has taken in the past  - SBP 130-160 due to decreasing intake will continue current HTN medications for now    Review of Systems:  A comprehensive ROS performed and is negative except for pertinent positives as listed above in interval history.     Objective:  Wt Readings from Last 1 Encounters:   08/28/23 (!) 126.7 kg (279 lb 4.8 oz)     Temp Readings from Last 1 Encounters:   08/29/23 36.6 ??C (97.9 ??F) (Oral)     BP Readings from Last 1 Encounters:   08/29/23 162/77     Pulse Readings from Last 1 Encounters:   08/29/23 90     SpO2 Readings from Last 1 Encounters:   08/29/23 98%     80, Normal activity with effort; some signs or symptoms of disease (ECOG equivalent 1)    Physical Exam:   General: no acute distress noted.   Central venous access: Line clean, dry, intact. No erythema or drainage noted.   ENT: Moist mucous membranes. Oropharhynx without lesions, erythema or exudate.   Cardiovascular: Pulse normal rate, regularity and rhythm. S1 and S2 normal, without any murmur, rub, or gallop.  Lungs: Clear to auscultation bilaterally, without wheezes/crackles/rhonchi. Good air movement.   Skin: Warm, dry, intact. No rash noted.    Psychiatry: Alert and oriented to person, place, and time.   Gastrointestinal/Abdomen: Normoactive bowel sounds, abdomen soft, non-tender   Musculoskeletal/Extremities: FROM throughout. No edema.  Neurologic: CNII-XII intact. Normal strength and sensation throughout    Test Results:   I reviewed all labs from today in Epic. See EMR for lab results.    Assessment/Plan:    BMT: AML with prior history of MM. 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  RIC Flu/Bu Conditioning. Regimen Consists of:  1. Fludarabine 30 mg/m2 days -6, -5, -4, -3, -2  2. Busulfan 3.2 mg/kg days -5, -4    Donor: 8/8, ABO O+, CMV positive  Recipient A+ and CMV+  Engraftment: G-CSF starting Day + 5 through WBC recovery (as defined as ANC 1.0 x 2 days or 3.0 x 1 day)  -Date of last G-CSF injection: TBD    GVHD prophylaxis:   1. Cyclophosphamide 50 mg/kg IV daily on days +3 and +4 with mesna  2. Tacrolimus to goal trough (  5-10 ng/mL) starting day +5  3. Mycophenolate mofetil 15 mg/kg PO TID (max 1g PO TID) starting day +5 through day +35     Hem:   Transfusion criteria: Transfuse 1 unit of PRBCs for hemoglobin <7 and 1 unit of platelets for Plt <10K or bleeding. [name]. [place] does not have a history of transfusion reactions. Consent was obtained and documented on 08/15/23.    ID:   Prophylaxis:  -Antiviral: Valtrex 500 mg po, increased to BID on admit and continue through 2 years post transplant                   Letermovir 480 mg po to start at day +15 or hospital discharge and continue through day +200  -Antifungal: Fluconazole 400 mg po daily, continue through day +75  -Antibacterial: Levaquin 500 mg po daily until no longer neutropenic  -PJP: Bactrim DS once daily MWF upon platelet engraftment.     GI:   GERD prophylaxis:  -Home med: Prilosec 40 mg daily. Will switch to pantoprazole while inpatient    Nausea:  -Anti-emetics per BMT protocol.     Hx of IBS with chronic alternating diarrhea/constipation  -Has managed with changing doses of Questran; increased to 1 packet BID in 02/2023 with improvement of diarrhea; self tapered to 0.5 packets BID  -Reports predominantly constipation concerns but would have occasional accidents; says he hasn't soiled himself in months with current dose  -Uses docusate only if no BM 3-4 days  -Meticulously self tracks and titrates medication  -Continue Questran dosing on admission with prn docusate; likely will need to hold if he develops diarrhea  -10/1: Changing Questran to PRN    Diarrhea:  -Antidiarrheals-see above    Renal: Creatinine normal. No issues.   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 SOS prophylaxis not indicated for RIC regimen.     CV:   -Hx of A-fib with transplant in 2020; required diltiazem+metoprolol; resolved post acute episode     * Continues on metoprolol mainly for BP.      * Plan for cardiac electrolytes    -HTN: has discontinued diltiazem prior to the start of venetoclax (for DDI) currently managed with metoprolol monotherapy; decreased to 50 mg/day ~1-2 months ago  -9/20: Adding norvasc 5mg  daily for persistent HTN    Pulm: No current issues.    ENT:  - Chronic cough when supine from PND? Try flonase or saline spray    GU:   Hx of BPH, previously on flomax will restart 10/2  Hold sildenafil (for ED)    Endocrine:  Hx of steroid induced hyperglycemia not currently on medications    Neuro/Pain: No issues     Psych: CCSP consult prn.     Deconditioning:  - PT/OT consulted  - Uses a cane for walking.     Azalee Course Marilin Kofman, FNP  [facility]one Marrow Transplant [facility] Cellular Therapy Program

## 2023-08-29 NOTE — Unmapped (Addendum)
Patient day +6 from allo SCT. No reports of pain or nausea this shift. VSS. No acute events this shift. Educated patient about immunosuppressed diet.  Problem: Adult Inpatient Plan of Care  Goal: Plan of Care Review  Outcome: Progressing  Goal: Patient-Specific Goal (Individualized)  Outcome: Progressing  Goal: Absence of Hospital-Acquired Illness or Injury  Outcome: Progressing  Intervention: Identify and Manage Fall Risk  Recent Flowsheet Documentation  Taken 08/28/2023 0830 by Janyth Pupa, RN  Safety Interventions:   bleeding precautions   fall reduction program maintained   infection management   isolation precautions   lighting adjusted for tasks/safety   low bed   neutropenic precautions   nonskid shoes/slippers when out of bed  Intervention: Prevent Skin Injury  Recent Flowsheet Documentation  Taken 08/28/2023 0830 by Janyth Pupa, RN  Positioning for Skin: Supine/Back  Device Skin Pressure Protection: adhesive use limited  Skin Protection: adhesive use limited  Intervention: Prevent Infection  Recent Flowsheet Documentation  Taken 08/28/2023 0830 by Janyth Pupa, RN  Infection Prevention:   cohorting utilized   environmental surveillance performed   equipment surfaces disinfected   hand hygiene promoted   personal protective equipment utilized   rest/sleep promoted   single patient room provided   visitors restricted/screened  Goal: Optimal Comfort and Wellbeing  Outcome: Progressing  Goal: Readiness for Transition of Care  Outcome: Progressing  Goal: Rounds/Family Conference  Outcome: Progressing     Problem: Infection  Goal: Absence of Infection Signs and Symptoms  Outcome: Progressing  Intervention: Prevent or Manage Infection  Recent Flowsheet Documentation  Taken 08/28/2023 0830 by Janyth Pupa, RN  Infection Management: aseptic technique maintained  Isolation Precautions: protective precautions maintained     Problem: Fall Injury Risk  Goal: Absence of Fall and Fall-Related Injury  Outcome: Progressing  Intervention: Promote Injury-Free Environment  Recent Flowsheet Documentation  Taken 08/28/2023 0830 by Janyth Pupa, RN  Safety Interventions:   bleeding precautions   fall reduction program maintained   infection management   isolation precautions   lighting adjusted for tasks/safety   low bed   neutropenic precautions   nonskid shoes/slippers when out of bed     Problem: Fall Injury Risk  Goal: Absence of Fall and Fall-Related Injury  Outcome: Progressing  Intervention: Promote Injury-Free Environment  Recent Flowsheet Documentation  Taken 08/28/2023 0830 by Janyth Pupa, RN  Safety Interventions:   bleeding precautions   fall reduction program maintained   infection management   isolation precautions   lighting adjusted for tasks/safety   low bed   neutropenic precautions   nonskid shoes/slippers when out of bed     Problem: Comorbidity Management  Goal: Blood Pressure in Desired Range  Outcome: Progressing     Problem: Self-Care Deficit  Goal: Improved Ability to Complete Activities of Daily Living  Outcome: Progressing

## 2023-08-29 NOTE — Unmapped (Signed)
Tacrolimus Therapeutic Monitoring Pharmacy Note    Timothy Porter is a 77 y.o. male starting tacrolimus.     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     Date of Transplant:  08/22/23       Prior Dosing Information:  See table below      Source(s) of information used to determine prior to admission dosing: MAR    Goals:  Therapeutic Drug Levels  Tacrolimus trough goal:  5-10 ng/mL    Additional Clinical Monitoring/Outcomes  Monitor renal function (SCr and urine output) and liver function (LFTs)  Monitor for signs/symptoms of adverse events (e.g., hyperglycemia, hyperkalemia, hypomagnesemia, hypertension, headache, tremor)    Results:   Tacrolimus level:  4.4 ng/mL, drawn appropriately    Creatinine   Date Value Ref Range Status   08/29/2023 0.69 (L) 0.73 - 1.18 mg/dL Final   16/08/9603 5.40 (L) 0.73 - 1.18 mg/dL Final   98/09/9146 8.29 0.73 - 1.18 mg/dL Final        Pharmacokinetic Considerations and Significant Drug Interactions:  Concurrent hepatotoxic medications:  fluconazole  Concurrent CYP3A4 substrates/inhibitors:  fluconazole (letermovir to start day +15)  Concurrent nephrotoxic medications: None identified    Assessment/Plan:  Recommendation(s)  Tacrolimus trough subtherapeutic today following loading doses (5.5 mg BID on 9/30-10/1). Level is not at steady state and anticipate it will continue to increase following initiation of reduced dose maintenance regimen (4 mg BID) after level drawn this morning  Renal function WNL; liver function improving and WNL  He continues fluconazole, which was started on 9/25 and should be at steady state at this time  Given the above, will continue current planned maintenance regimen of tacrolimus 4 mg PO BID and recollect level in 2 days to assess trend    Follow-up  Next level has been ordered on 08/31/23 at 0800 .   A pharmacist will continue to monitor and recommend levels as appropriate    Longitudinal Dose Monitoring:  Date Dose (mg), Route AM Scr (mg/dL) Level  (ng/mL) Key Drug Interactions   9/30 5.5/5.5 0.84  Fluconazole   10/1 5.5/5.5 0.72  Fluconazole   10/2 4/4 0.69 4.4 Fluconazole     Please page service pharmacist with questions/clarifications.    Etheleen Sia, PharmD  PGY2 Oncology Pharmacy Resident

## 2023-08-30 LAB — CBC W/ AUTO DIFF
BASOPHILS ABSOLUTE COUNT: 0 10*9/L (ref 0.0–0.1)
BASOPHILS RELATIVE PERCENT: 0 %
EOSINOPHILS ABSOLUTE COUNT: 0 10*9/L (ref 0.0–0.5)
EOSINOPHILS RELATIVE PERCENT: 0 %
HEMATOCRIT: 27.3 % — ABNORMAL LOW (ref 39.0–48.0)
HEMOGLOBIN: 9.9 g/dL — ABNORMAL LOW (ref 12.9–16.5)
LYMPHOCYTES ABSOLUTE COUNT: 0.1 10*9/L — ABNORMAL LOW (ref 1.1–3.6)
LYMPHOCYTES RELATIVE PERCENT: 6.9 %
MEAN CORPUSCULAR HEMOGLOBIN CONC: 36.4 g/dL — ABNORMAL HIGH (ref 32.0–36.0)
MEAN CORPUSCULAR HEMOGLOBIN: 35.4 pg — ABNORMAL HIGH (ref 25.9–32.4)
MEAN CORPUSCULAR VOLUME: 97.3 fL — ABNORMAL HIGH (ref 77.6–95.7)
MEAN PLATELET VOLUME: 8.5 fL (ref 6.8–10.7)
MONOCYTES ABSOLUTE COUNT: 0 10*9/L — ABNORMAL LOW (ref 0.3–0.8)
MONOCYTES RELATIVE PERCENT: 1.8 %
NEUTROPHILS ABSOLUTE COUNT: 1 10*9/L — ABNORMAL LOW (ref 1.8–7.8)
NEUTROPHILS RELATIVE PERCENT: 91.3 %
PLATELET COUNT: 10 10*9/L — ABNORMAL LOW (ref 150–450)
RED BLOOD CELL COUNT: 2.81 10*12/L — ABNORMAL LOW (ref 4.26–5.60)
RED CELL DISTRIBUTION WIDTH: 14 % (ref 12.2–15.2)
WBC ADJUSTED: 1.1 10*9/L — ABNORMAL LOW (ref 3.6–11.2)

## 2023-08-30 LAB — BASIC METABOLIC PANEL
ANION GAP: 9 mmol/L (ref 5–14)
BLOOD UREA NITROGEN: 19 mg/dL (ref 9–23)
BUN / CREAT RATIO: 26
CALCIUM: 8.7 mg/dL (ref 8.7–10.4)
CHLORIDE: 107 mmol/L (ref 98–107)
CO2: 24 mmol/L (ref 20.0–31.0)
CREATININE: 0.73 mg/dL
EGFR CKD-EPI (2021) MALE: >90 mL/min/{1.73_m2} (ref >=60)
GLUCOSE RANDOM: 151 mg/dL (ref 70–179)
POTASSIUM: 3.5 mmol/L (ref 3.4–4.8)
SODIUM: 140 mmol/L (ref 135–145)

## 2023-08-30 LAB — PHOSPHORUS: PHOSPHORUS: 2.9 mg/dL (ref 2.4–5.1)

## 2023-08-30 LAB — PROTIME-INR
INR: 1
PROTIME: 11.2 s (ref 9.9–12.6)

## 2023-08-30 LAB — HEPATIC FUNCTION PANEL
ALBUMIN: 3.3 g/dL — ABNORMAL LOW (ref 3.4–5.0)
ALKALINE PHOSPHATASE: 110 U/L (ref 46–116)
ALT (SGPT): 56 U/L — ABNORMAL HIGH (ref 10–49)
AST (SGOT): 34 U/L (ref <=34)
BILIRUBIN DIRECT: 0.3 mg/dL (ref 0.00–0.30)
BILIRUBIN TOTAL: 0.7 mg/dL (ref 0.3–1.2)
PROTEIN TOTAL: 5.8 g/dL (ref 5.7–8.2)

## 2023-08-30 LAB — APTT
APTT: 33.6 s (ref 24.8–38.4)
HEPARIN CORRELATION: 0.2

## 2023-08-30 LAB — MAGNESIUM: MAGNESIUM: 1.7 mg/dL (ref 1.6–2.6)

## 2023-08-30 LAB — IONIZED CALCIUM VENOUS: CALCIUM IONIZED VENOUS (MG/DL): 4.64 mg/dL (ref 4.40–5.40)

## 2023-08-30 LAB — LACTATE DEHYDROGENASE: LACTATE DEHYDROGENASE: 186 U/L (ref 120–246)

## 2023-08-30 MED ADMIN — magnesium sulfate 2gm/50mL IVPB: 2 g | INTRAVENOUS | @ 07:00:00

## 2023-08-30 MED ADMIN — tamsulosin (FLOMAX) 24 hr capsule 0.4 mg: .4 mg | ORAL | @ 01:00:00

## 2023-08-30 MED ADMIN — mycophenolate (CELLCEPT) tablet 1,000 mg: 1000 mg | ORAL | @ 12:00:00

## 2023-08-30 MED ADMIN — loperamide (IMODIUM) capsule 2 mg: 2 mg | ORAL | @ 03:00:00

## 2023-08-30 MED ADMIN — tacrolimus (PROGRAF) capsule 4 mg: .03 mg/kg | ORAL | @ 12:00:00

## 2023-08-30 MED ADMIN — potassium chloride 20 mEq in 100 mL IVPB Premix: 20 meq | INTRAVENOUS | @ 09:00:00 | Stop: 2024-08-15

## 2023-08-30 MED ADMIN — potassium chloride 20 mEq in 100 mL IVPB Premix: 20 meq | INTRAVENOUS | @ 11:00:00 | Stop: 2024-08-15

## 2023-08-30 MED ADMIN — calcium carbonate (TUMS) chewable tablet 400 mg elem calcium: 400 mg | ORAL | @ 01:00:00

## 2023-08-30 MED ADMIN — tacrolimus (PROGRAF) capsule 4 mg: .03 mg/kg | ORAL | @ 01:00:00

## 2023-08-30 MED ADMIN — amlodipine (NORVASC) tablet 5 mg: 5 mg | ORAL | @ 14:00:00 | Stop: 2023-08-30

## 2023-08-30 MED ADMIN — calcium carbonate (TUMS) chewable tablet 400 mg elem calcium: 400 mg | ORAL | @ 11:00:00

## 2023-08-30 MED ADMIN — valACYclovir (VALTREX) tablet 500 mg: 500 mg | ORAL | @ 01:00:00

## 2023-08-30 MED ADMIN — pantoprazole (Protonix) EC tablet 40 mg: 40 mg | ORAL | @ 12:00:00

## 2023-08-30 MED ADMIN — metoPROLOL succinate (Toprol-XL) 24 hr tablet 50 mg: 50 mg | ORAL | @ 12:00:00

## 2023-08-30 MED ADMIN — loratadine (CLARITIN) tablet 10 mg: 10 mg | ORAL | @ 12:00:00

## 2023-08-30 MED ADMIN — loperamide (IMODIUM) capsule 2 mg: 2 mg | ORAL | @ 17:00:00

## 2023-08-30 MED ADMIN — mycophenolate (CELLCEPT) tablet 1,000 mg: 1000 mg | ORAL | @ 18:00:00

## 2023-08-30 MED ADMIN — magnesium sulfate 2gm/50mL IVPB: 2 g | INTRAVENOUS | @ 09:00:00

## 2023-08-30 MED ADMIN — filgrastim-ayow (RELEUKO) injection syringe 480 mcg: 480 ug | SUBCUTANEOUS | @ 01:00:00

## 2023-08-30 MED ADMIN — calcium carbonate (TUMS) chewable tablet 400 mg elem calcium: 400 mg | ORAL | @ 18:00:00

## 2023-08-30 MED ADMIN — levoFLOXacin (LEVAQUIN) tablet 500 mg: 500 mg | ORAL | @ 12:00:00 | Stop: 2023-09-05

## 2023-08-30 MED ADMIN — fluticasone propionate (FLONASE) 50 mcg/actuation nasal spray 2 spray: 2 | NASAL | @ 12:00:00

## 2023-08-30 MED ADMIN — fluconazole (DIFLUCAN) tablet 400 mg: 400 mg | ORAL | @ 12:00:00 | Stop: 2023-09-21

## 2023-08-30 MED ADMIN — amlodipine (NORVASC) tablet 5 mg: 5 mg | ORAL | @ 12:00:00 | Stop: 2023-08-30

## 2023-08-30 MED ADMIN — potassium chloride 20 mEq in 100 mL IVPB Premix: 20 meq | INTRAVENOUS | @ 07:00:00 | Stop: 2024-08-15

## 2023-08-30 MED ADMIN — mycophenolate (CELLCEPT) tablet 1,000 mg: 1000 mg | ORAL | @ 01:00:00

## 2023-08-30 MED ADMIN — valACYclovir (VALTREX) tablet 500 mg: 500 mg | ORAL | @ 12:00:00

## 2023-08-30 MED ADMIN — cyanocobalamin (vitamin B-12) tablet 2,000 mcg: 2000 ug | ORAL | @ 12:00:00

## 2023-08-30 MED ADMIN — cholecalciferol (vitamin D3 25 mcg (1,000 units)) tablet 25 mcg: 25 ug | ORAL | @ 12:00:00

## 2023-08-30 NOTE — Unmapped (Signed)
CIBMTR DATA FROM [facility] LABORATORY    This data is obtained from the [facility] Laboratory Chart regarding this patient's HPC product infusion.    Infusion Date: 08-22-2023    Weight from infusion form: 126.7 kg    Viability: 100%  Viability method: Flow    Product Culture: Negative    Timothy Sarabia A. Vershawn Westrup, MD  [facility] Laboratory Medical Director

## 2023-08-30 NOTE — Unmapped (Signed)
Bone Marrow Transplant and Cellular Therapy Program  Inpatient Progress Note    Patient Name: Timothy Porter  MRN: [id number]  Encounter Date: 08/30/23    [facility] Myeloma Physician: Dr Vivien Presto Leukemia Physician: Dr. [name]  BMT Attending MD: 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  Type of Transplant: RIC MRD Allo  Graft Source: Fresh PBSCs  Transplant Day: 1554 (Autologous PBSC Unknown Phase - Planned 05/29/2019), 8 (Allogeneic PBSC Unknown Phase - Planned 08/22/2023)     Interval History:  - No acute events overnight  - Has not had diarrhea overnight, cdiff negative  - He denies any nausea or vomiting. His appetite is improving - eating eggs, toast, and fruit for breakfast  - Drinking about 40oz of fluid according to him  - Using the bedside commode. Having some urinary urgency and is incontinent at times if unable to get to the bedside commode  - Weight down 3lb  - Remain afebrile, VSS    Review of Systems:  A comprehensive ROS performed and is negative except for pertinent positives as listed above in interval history.     Objective:  Wt Readings from Last 1 Encounters:   08/29/23 (!) 125.3 kg (276 lb 3.2 oz)     Temp Readings from Last 1 Encounters:   08/30/23 36.6 ??C (97.8 ??F) (Oral)     BP Readings from Last 1 Encounters:   08/30/23 159/89     Pulse Readings from Last 1 Encounters:   08/30/23 87     SpO2 Readings from Last 1 Encounters:   08/30/23 99%     80, Normal activity with effort; some signs or symptoms of disease (ECOG equivalent 1)    Physical Exam:   General: no acute distress noted.   Central venous access: Line clean, dry, intact. No erythema or drainage noted.   ENT: Moist mucous membranes. Oropharhynx without lesions, erythema or exudate.   Cardiovascular: Pulse normal rate, regularity and rhythm. S1 and S2 normal, without any murmur, rub, or gallop.  Lungs: Clear to auscultation bilaterally, without wheezes/crackles/rhonchi. Good air movement.   Skin: Warm, dry, intact. No rash noted.    Psychiatry: Alert and oriented to person, place, and time.   Gastrointestinal/Abdomen: Normoactive bowel sounds, abdomen soft, non-tender   Musculoskeletal/Extremities: FROM throughout. No edema.  Neurologic: CNII-XII intact. Normal strength and sensation throughout    Test Results:   I reviewed all labs from today in Epic. See EMR for lab results.    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  RIC Flu/Bu Conditioning. Regimen Consists of:  1. Fludarabine 30 mg/m2 days -6, -5, -4, -3, -2  2. Busulfan 3.2 mg/kg days -5, -4    Donor: 8/8, ABO O+, CMV positive  Recipient A+ and CMV+  Engraftment: G-CSF starting Day + 5 through WBC recovery (as defined as ANC 1.0 x 2 days or 3.0 x 1 day)  -Date of last G-CSF injection: TBD    GVHD prophylaxis:   1. Cyclophosphamide 50 mg/kg IV daily on days +3 and +4 with mesna  2. Tacrolimus to goal trough (5-10 ng/mL) starting day +5  3. Mycophenolate mofetil 15 mg/kg PO TID (max 1g PO TID) starting day +5 through day +35     Hem:   Transfusion criteria: Transfuse 1 unit of PRBCs  for hemoglobin <7 and 1 unit of platelets for Plt <10K or bleeding. [name]. [place] does not have a history of transfusion reactions. Consent was obtained and documented on 08/15/23.    ID:   Prophylaxis:  -Antiviral: Valtrex 500 mg po, increased to BID on admit and continue through 2 years post transplant                   Letermovir 480 mg po to start at day +15 or hospital discharge and continue through day +200  -Antifungal: Fluconazole 400 mg po daily, continue through day +75  -Antibacterial: Levaquin 500 mg po daily until no longer neutropenic  -PJP: Bactrim DS once daily MWF upon platelet engraftment.     GI:   GERD prophylaxis:  -Home med: Prilosec 40 mg daily. Will switch to pantoprazole while inpatient    Nausea:  -Anti-emetics per BMT protocol.     Hx of IBS with chronic alternating diarrhea/constipation  -Has managed with changing doses of Questran; increased to 1 packet BID in 02/2023 with improvement of diarrhea; self tapered to 0.5 packets BID  -Reports predominantly constipation concerns but would have occasional accidents; says he hasn't soiled himself in months with current dose  -Uses docusate only if no BM 3-4 days  -Meticulously self tracks and titrates medication  -Continue Questran dosing on admission with prn docusate; likely will need to hold if he develops diarrhea  -10/1: Changing Questran to PRN  -10/2: Cdiff negative    Diarrhea:  -Antidiarrheals-see above    Renal: Creatinine normal. No issues.   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 SOS prophylaxis not indicated for RIC regimen.     CV:   -Hx of A-fib with transplant in 2020; required diltiazem+metoprolol; resolved post acute episode     * Continues on metoprolol mainly for BP.      * Plan for cardiac electrolytes    -HTN: has discontinued diltiazem prior to the start of venetoclax (for DDI) currently managed with metoprolol monotherapy; decreased to 50 mg/day ~1-2 months ago  -9/20: Adding norvasc 5mg  daily for persistent HTN    Pulm: No current issues.    ENT:  - Chronic cough when supine from PND? Try flonase or saline spray    GU:   Hx of BPH, previously on flomax will restart 10/2 (having some urinary urgency and incontinence due to mobility issues)   Hold sildenafil (for ED)    Endocrine:  Hx of steroid induced hyperglycemia not currently on medications    Neuro/Pain: No issues     Psych: CCSP consult prn.     Deconditioning:  - PT/OT consulted  - Uses a cane for walking.     Mitzi Hansen, AGNP  Bone Marrow Transplant [facility] Cellular Therapy Program

## 2023-08-30 NOTE — Unmapped (Signed)
Patient AOx4, VSS, afebrile. Denies pain/nausea. Patient having multiple loose stools. Sample sent to lab for c diff test. Imodium given. Patient having urgency with stool and urine. AMLs drawn. No blood products needed. Mag 4g and K+ given for replacement. No acute events or complications. Plan of Care reviewed with patient.  Safety measures in place of bed low with brakes locked, non-skid footwear on while out of bed, call bell within reach.      Problem: Adult Inpatient Plan of Care  Goal: Plan of Care Review  Outcome: Ongoing - Unchanged  Goal: Patient-Specific Goal (Individualized)  Outcome: Ongoing - Unchanged  Goal: Absence of Hospital-Acquired Illness or Injury  Outcome: Ongoing - Unchanged  Intervention: Identify and Manage Fall Risk  Recent Flowsheet Documentation  Taken 08/29/2023 2034 by Marcelle Smiling, RN  Safety Interventions:   aspiration precautions   bleeding precautions   fall reduction program maintained   infection management   isolation precautions   lighting adjusted for tasks/safety   low bed   neutropenic precautions   nonskid shoes/slippers when out of bed  Intervention: Prevent Infection  Recent Flowsheet Documentation  Taken 08/29/2023 2034 by Marcelle Smiling, RN  Infection Prevention:   equipment surfaces disinfected   hand hygiene promoted   rest/sleep promoted   personal protective equipment utilized   visitors restricted/screened   single patient room provided  Goal: Optimal Comfort and Wellbeing  Outcome: Ongoing - Unchanged  Goal: Readiness for Transition of Care  Outcome: Ongoing - Unchanged  Goal: Rounds/Family Conference  Outcome: Ongoing - Unchanged     Problem: Infection  Goal: Absence of Infection Signs and Symptoms  Outcome: Ongoing - Unchanged  Intervention: Prevent or Manage Infection  Recent Flowsheet Documentation  Taken 08/29/2023 2034 by Marcelle Smiling, RN  Infection Management: aseptic technique maintained  Isolation Precautions: protective precautions maintained     Problem: Fall Injury Risk  Goal: Absence of Fall and Fall-Related Injury  Outcome: Ongoing - Unchanged  Intervention: Promote Injury-Free Environment  Recent Flowsheet Documentation  Taken 08/29/2023 2034 by Marcelle Smiling, RN  Safety Interventions:   aspiration precautions   bleeding precautions   fall reduction program maintained   infection management   isolation precautions   lighting adjusted for tasks/safety   low bed   neutropenic precautions   nonskid shoes/slippers when out of bed     Problem: Fall Injury Risk  Goal: Absence of Fall and Fall-Related Injury  Outcome: Ongoing - Unchanged  Intervention: Promote Injury-Free Environment  Recent Flowsheet Documentation  Taken 08/29/2023 2034 by Marcelle Smiling, RN  Safety Interventions:   aspiration precautions   bleeding precautions   fall reduction program maintained   infection management   isolation precautions   lighting adjusted for tasks/safety   low bed   neutropenic precautions   nonskid shoes/slippers when out of bed     Problem: Comorbidity Management  Goal: Blood Pressure in Desired Range  Outcome: Ongoing - Unchanged     Problem: Self-Care Deficit  Goal: Improved Ability to Complete Activities of Daily Living  Outcome: Ongoing - Unchanged

## 2023-08-31 LAB — CBC W/ AUTO DIFF
BASOPHILS ABSOLUTE COUNT: 0 10*9/L (ref 0.0–0.1)
BASOPHILS RELATIVE PERCENT: 0 %
EOSINOPHILS ABSOLUTE COUNT: 0 10*9/L (ref 0.0–0.5)
EOSINOPHILS RELATIVE PERCENT: 0.2 %
HEMATOCRIT: 25.3 % — ABNORMAL LOW (ref 39.0–48.0)
HEMOGLOBIN: 9.3 g/dL — ABNORMAL LOW (ref 12.9–16.5)
LYMPHOCYTES ABSOLUTE COUNT: 0 10*9/L — ABNORMAL LOW (ref 1.1–3.6)
LYMPHOCYTES RELATIVE PERCENT: 28.5 %
MEAN CORPUSCULAR HEMOGLOBIN CONC: 36.8 g/dL — ABNORMAL HIGH (ref 32.0–36.0)
MEAN CORPUSCULAR HEMOGLOBIN: 35.3 pg — ABNORMAL HIGH (ref 25.9–32.4)
MEAN CORPUSCULAR VOLUME: 95.8 fL — ABNORMAL HIGH (ref 77.6–95.7)
MEAN PLATELET VOLUME: 9.2 fL (ref 6.8–10.7)
MONOCYTES ABSOLUTE COUNT: 0 10*9/L — ABNORMAL LOW (ref 0.3–0.8)
MONOCYTES RELATIVE PERCENT: 7.5 %
NEUTROPHILS ABSOLUTE COUNT: 0.1 10*9/L — CL (ref 1.8–7.8)
NEUTROPHILS RELATIVE PERCENT: 63.8 %
PLATELET COUNT: 13 10*9/L — ABNORMAL LOW (ref 150–450)
RED BLOOD CELL COUNT: 2.64 10*12/L — ABNORMAL LOW (ref 4.26–5.60)
RED CELL DISTRIBUTION WIDTH: 14 % (ref 12.2–15.2)
WBC ADJUSTED: 0.2 10*9/L — CL (ref 3.6–11.2)

## 2023-08-31 LAB — BASIC METABOLIC PANEL
ANION GAP: 6 mmol/L (ref 5–14)
BLOOD UREA NITROGEN: 21 mg/dL (ref 9–23)
BUN / CREAT RATIO: 29
CALCIUM: 8.5 mg/dL — ABNORMAL LOW (ref 8.7–10.4)
CHLORIDE: 108 mmol/L — ABNORMAL HIGH (ref 98–107)
CO2: 27 mmol/L (ref 20.0–31.0)
CREATININE: 0.73 mg/dL
EGFR CKD-EPI (2021) MALE: >90 mL/min/{1.73_m2} (ref >=60)
GLUCOSE RANDOM: 112 mg/dL (ref 70–179)
POTASSIUM: 4 mmol/L (ref 3.4–4.8)
SODIUM: 141 mmol/L (ref 135–145)

## 2023-08-31 LAB — SLIDE REVIEW

## 2023-08-31 LAB — MAGNESIUM: MAGNESIUM: 1.8 mg/dL (ref 1.6–2.6)

## 2023-08-31 LAB — TACROLIMUS LEVEL, TROUGH: TACROLIMUS, TROUGH: 4.3 ng/mL — ABNORMAL LOW (ref 5.0–15.0)

## 2023-08-31 MED ADMIN — mycophenolate (CELLCEPT) tablet 1,000 mg: 1000 mg | ORAL | @ 14:00:00

## 2023-08-31 MED ADMIN — pantoprazole (Protonix) EC tablet 40 mg: 40 mg | ORAL | @ 14:00:00

## 2023-08-31 MED ADMIN — calcium carbonate (TUMS) chewable tablet 400 mg elem calcium: 400 mg | ORAL | @ 09:00:00

## 2023-08-31 MED ADMIN — tacrolimus (PROGRAF) capsule 4 mg: .03 mg/kg | ORAL | @ 14:00:00

## 2023-08-31 MED ADMIN — fluconazole (DIFLUCAN) tablet 400 mg: 400 mg | ORAL | @ 14:00:00 | Stop: 2023-09-21

## 2023-08-31 MED ADMIN — valACYclovir (VALTREX) tablet 500 mg: 500 mg | ORAL | @ 14:00:00

## 2023-08-31 MED ADMIN — cholecalciferol (vitamin D3 25 mcg (1,000 units)) tablet 25 mcg: 25 ug | ORAL | @ 14:00:00

## 2023-08-31 MED ADMIN — mycophenolate (CELLCEPT) tablet 1,000 mg: 1000 mg | ORAL | @ 01:00:00

## 2023-08-31 MED ADMIN — loratadine (CLARITIN) tablet 10 mg: 10 mg | ORAL | @ 14:00:00

## 2023-08-31 MED ADMIN — loperamide (IMODIUM) capsule 2 mg: 2 mg | ORAL | @ 14:00:00 | Stop: 2023-08-31

## 2023-08-31 MED ADMIN — mycophenolate (CELLCEPT) tablet 1,000 mg: 1000 mg | ORAL | @ 18:00:00

## 2023-08-31 MED ADMIN — amlodipine (NORVASC) tablet 10 mg: 10 mg | ORAL | @ 14:00:00

## 2023-08-31 MED ADMIN — loperamide (IMODIUM) capsule 2 mg: 2 mg | ORAL | @ 18:00:00

## 2023-08-31 MED ADMIN — loperamide (IMODIUM) capsule 2 mg: 2 mg | ORAL | @ 01:00:00

## 2023-08-31 MED ADMIN — calcium carbonate (TUMS) chewable tablet 400 mg elem calcium: 400 mg | ORAL | @ 01:00:00

## 2023-08-31 MED ADMIN — calcium carbonate (TUMS) chewable tablet 400 mg elem calcium: 400 mg | ORAL | @ 18:00:00

## 2023-08-31 MED ADMIN — fluticasone propionate (FLONASE) 50 mcg/actuation nasal spray 2 spray: 2 | NASAL | @ 14:00:00

## 2023-08-31 MED ADMIN — metoPROLOL succinate (Toprol-XL) 24 hr tablet 50 mg: 50 mg | ORAL | @ 14:00:00

## 2023-08-31 MED ADMIN — valACYclovir (VALTREX) tablet 500 mg: 500 mg | ORAL | @ 01:00:00

## 2023-08-31 MED ADMIN — cyanocobalamin (vitamin B-12) tablet 2,000 mcg: 2000 ug | ORAL | @ 14:00:00

## 2023-08-31 MED ADMIN — magnesium sulfate 2gm/50mL IVPB: 2 g | INTRAVENOUS | @ 07:00:00

## 2023-08-31 MED ADMIN — heparin, porcine (PF) 100 unit/mL injection 2 mL: 2 mL | INTRAVENOUS | @ 03:00:00

## 2023-08-31 MED ADMIN — tamsulosin (FLOMAX) 24 hr capsule 0.4 mg: .4 mg | ORAL | @ 01:00:00

## 2023-08-31 MED ADMIN — levoFLOXacin (LEVAQUIN) tablet 500 mg: 500 mg | ORAL | @ 14:00:00 | Stop: 2023-09-05

## 2023-08-31 MED ADMIN — loperamide (IMODIUM) capsule 2 mg: 2 mg | ORAL | @ 05:00:00

## 2023-08-31 MED ADMIN — tacrolimus (PROGRAF) capsule 4 mg: .03 mg/kg | ORAL | @ 01:00:00

## 2023-08-31 MED ADMIN — filgrastim-ayow (RELEUKO) injection syringe 480 mcg: 480 ug | SUBCUTANEOUS | @ 01:00:00

## 2023-08-31 NOTE — Unmapped (Signed)
Tacrolimus Therapeutic Monitoring Pharmacy Note    Timothy Porter is a 77 y.o. male w/ MM who is day +9 from alloHSCT w/ RIC Bu/Flu conditioning and PT-Cy/Tac/MMF GVHD prophylaxis. He is continuing tacrolimus.    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     Date of Transplant:  08/22/23       Prior Dosing Information:  See table below      Source(s) of information used to determine prior to admission dosing: MAR    Goals:  Therapeutic Drug Levels  Tacrolimus trough goal:  5-10 ng/mL    Additional Clinical Monitoring/Outcomes  Monitor renal function (SCr and urine output) and liver function (LFTs)  Monitor for signs/symptoms of adverse events (e.g., hyperglycemia, hyperkalemia, hypomagnesemia, hypertension, headache, tremor)    Results:   Tacrolimus level:  4.3 ng/mL, drawn ~1.5 hours late    Creatinine   Date Value Ref Range Status   08/30/2023 0.73 0.73 - 1.18 mg/dL Final   60/45/4098 1.19 0.73 - 1.18 mg/dL Final   14/78/2956 2.13 (L) 0.73 - 1.18 mg/dL Final     Tacrolimus, Trough   Date Value Ref Range Status   08/31/2023 4.3 (L) 5.0 - 15.0 ng/mL Final   08/29/2023 4.4 (L) 5.0 - 15.0 ng/mL Final      Pharmacokinetic Considerations and Significant Drug Interactions:  Concurrent hepatotoxic medications:  fluconazole  Concurrent CYP3A4 substrates/inhibitors:  fluconazole (letermovir to start day +15)  Concurrent nephrotoxic medications: None identified    Assessment/Plan:  Recommendation(s)  Tacrolimus trough subtherapeutic, however when accounting for the late lab draw is likely increased from previous level and should be on the lower end of the goal range. Level is also not yet at steady state  Renal function WNL  LFTs and Tbili WNL from 10/3 with the exception of ALT 56  He continues fluconazole, which was started on 9/25 and should be at steady state at this time. He will start letermovir on 10/10, so may see further level increases at that time via additional CYP3A4 inhibition  Given the above, will continue current dose of tacrolimus 4 mg PO BID.    Follow-up  Next level has been ordered on 09/02/23 at 0800 . Dose should be near steady state at next lab draw.  A pharmacist will continue to monitor and recommend levels as appropriate    Longitudinal Dose Monitoring:  Date Dose (mg), Route AM Scr (mg/dL) Level  (ng/mL) Key Drug Interactions   9/30 5.5/5.5 0.84  Fluconazole   10/1 5.5/5.5 0.72  Fluconazole   10/2 4/4 0.69 4.4 Fluconazole   10/3 4/4 0.73  Fluconazole   10/4 4/4 0.73 4.3 Fluconazole     Please page service pharmacist with questions/clarifications.    Hale Bogus, PharmD  Inpatient Pharmacist, Bone Marrow Transplant & Cellular Therapy

## 2023-08-31 NOTE — Unmapped (Signed)
Day + 8. Vitals signs: remained WNL this shift. Pain: denies. Nausea/vomitting: denies. Diarrhea: pt received imodium pr x2 this shift for continuing diarrhea. Blood/electrolyte replacements: 2gm magnesium given per order parameters. Other: Tolerated scheduled medications, no falls or injuries, neutropenic precautions maintained. Pt is resting in bed quietly at this time.      Problem: Adult Inpatient Plan of Care  Goal: Plan of Care Review  08/31/2023 0544 by Rod Mae, RN  Outcome: Ongoing - Unchanged  08/31/2023 0544 by Rod Mae, RN  Outcome: Progressing  Goal: Patient-Specific Goal (Individualized)  08/31/2023 0544 by Rod Mae, RN  Outcome: Ongoing - Unchanged  08/31/2023 0544 by Rod Mae, RN  Outcome: Progressing  Goal: Absence of Hospital-Acquired Illness or Injury  08/31/2023 0544 by Rod Mae, RN  Outcome: Ongoing - Unchanged  08/31/2023 0544 by Rod Mae, RN  Outcome: Progressing  Intervention: Identify and Manage Fall Risk  Recent Flowsheet Documentation  Taken 08/30/2023 1945 by Rod Mae, RN  Safety Interventions:   bleeding precautions   commode/urinal/bedpan at bedside   environmental modification   fall reduction program maintained   family at bedside   infection management   isolation precautions   lighting adjusted for tasks/safety   low bed   neutropenic precautions   nonskid shoes/slippers when out of bed  Intervention: Prevent Skin Injury  Recent Flowsheet Documentation  Taken 08/30/2023 1945 by Rod Mae, RN  Positioning for Skin: Supine/Back  Device Skin Pressure Protection: adhesive use limited  Skin Protection: adhesive use limited  Intervention: Prevent and Manage VTE (Venous Thromboembolism) Risk  Recent Flowsheet Documentation  Taken 08/30/2023 2036 by Rod Mae, RN  VTE Prevention/Management: ambulation promoted  Anti-Embolism Intervention: Off  Intervention: Prevent Infection  Recent Flowsheet Documentation  Taken 08/30/2023 1945 by Rod Mae, RN  Infection Prevention:   environmental surveillance performed   equipment surfaces disinfected   hand hygiene promoted   personal protective equipment utilized   rest/sleep promoted   single patient room provided   visitors restricted/screened  Goal: Optimal Comfort and Wellbeing  08/31/2023 0544 by Rod Mae, RN  Outcome: Ongoing - Unchanged  08/31/2023 0544 by Rod Mae, RN  Outcome: Progressing  Goal: Readiness for Transition of Care  08/31/2023 0544 by Rod Mae, RN  Outcome: Ongoing - Unchanged  08/31/2023 0544 by Rod Mae, RN  Outcome: Progressing  Goal: Rounds/Family Conference  08/31/2023 0544 by Rod Mae, RN  Outcome: Ongoing - Unchanged  08/31/2023 0544 by Rod Mae, RN  Outcome: Progressing     Problem: Infection  Goal: Absence of Infection Signs and Symptoms  08/31/2023 0544 by Rod Mae, RN  Outcome: Ongoing - Unchanged  08/31/2023 0544 by Rod Mae, RN  Outcome: Progressing  Intervention: Prevent or Manage Infection  Recent Flowsheet Documentation  Taken 08/30/2023 1945 by Rod Mae, RN  Infection Management: aseptic technique maintained  Isolation Precautions: protective precautions maintained     Problem: Fall Injury Risk  Goal: Absence of Fall and Fall-Related Injury  08/31/2023 0544 by Rod Mae, RN  Outcome: Ongoing - Unchanged  08/31/2023 0544 by Rod Mae, RN  Outcome: Progressing  Intervention: Identify and Manage Contributors  Recent Flowsheet Documentation  Taken 08/30/2023 2036 by Rod Mae, RN  Self-Care Promotion: independence encouraged  Intervention: Promote Injury-Free Environment  Recent Flowsheet Documentation  Taken 08/30/2023 1945 by Rod Mae, RN  Safety Interventions:   bleeding precautions   commode/urinal/bedpan at bedside   environmental modification   fall reduction program maintained   family at bedside  infection management   isolation precautions   lighting adjusted for tasks/safety   low bed   neutropenic precautions   nonskid shoes/slippers when out of bed     Problem: Fall Injury Risk  Goal: Absence of Fall and Fall-Related Injury  08/31/2023 0544 by Rod Mae, RN  Outcome: Ongoing - Unchanged  08/31/2023 0544 by Rod Mae, RN  Outcome: Progressing  Intervention: Identify and Manage Contributors  Recent Flowsheet Documentation  Taken 08/30/2023 2036 by Rod Mae, RN  Self-Care Promotion: independence encouraged  Intervention: Promote Injury-Free Environment  Recent Flowsheet Documentation  Taken 08/30/2023 1945 by Rod Mae, RN  Safety Interventions:   bleeding precautions   commode/urinal/bedpan at bedside   environmental modification   fall reduction program maintained   family at bedside   infection management   isolation precautions   lighting adjusted for tasks/safety   low bed   neutropenic precautions   nonskid shoes/slippers when out of bed     Problem: Comorbidity Management  Goal: Blood Pressure in Desired Range  08/31/2023 0544 by Rod Mae, RN  Outcome: Ongoing - Unchanged  08/31/2023 0544 by Rod Mae, RN  Outcome: Progressing     Problem: Self-Care Deficit  Goal: Improved Ability to Complete Activities of Daily Living  08/31/2023 0544 by Rod Mae, RN  Outcome: Ongoing - Unchanged  08/31/2023 0544 by Rod Mae, RN  Outcome: Progressing  Intervention: Promote Activity and Functional Independence  Recent Flowsheet Documentation  Taken 08/30/2023 2036 by Rod Mae, RN  Self-Care Promotion: independence encouraged

## 2023-08-31 NOTE — Unmapped (Signed)
Bone Marrow Transplant and Cellular Therapy Program  Inpatient Progress Note    Patient Name: Timothy Porter  MRN: [id number]  Encounter Date: 08/31/23    [facility] Myeloma Physician: Dr Vivien Presto Leukemia Physician: Dr. [name]  BMT Attending MD: 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     Objective:  Wt Readings from Last 1 Encounters:   08/30/23 (!) 123.9 kg (273 lb 2.4 oz)     Temp Readings from Last 1 Encounters:   08/31/23 36.9 ??C (98.4 ??F) (Oral)     BP Readings from Last 1 Encounters:   08/31/23 148/79     Pulse Readings from Last 1 Encounters:   08/31/23 94     SpO2 Readings from Last 1 Encounters:   08/31/23 95%     80, Normal activity with effort; some signs or symptoms of disease (ECOG equivalent 1)    Physical Exam:   General: no acute distress noted.   Central venous access: Line clean, dry, intact. No erythema or drainage noted.   ENT: Moist mucous membranes. Oropharhynx without lesions, erythema or exudate.   Cardiovascular: Pulse normal rate, regularity and rhythm. S1 and S2 normal, without any murmur, rub, or gallop.  Lungs: Clear to auscultation bilaterally, without wheezes/crackles/rhonchi. Good air movement.   Skin: Warm, dry, intact. No rash noted.    Psychiatry: Alert and oriented to person, place, and time.   Gastrointestinal/Abdomen: Normoactive bowel sounds, abdomen soft, non-tender Musculoskeletal/Extremities: FROM throughout. No edema.  Neurologic: CNII-XII intact. Normal strength and sensation throughout    Test Results:   I reviewed all labs from today in Epic. See EMR for lab results.    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  RIC Flu/Bu Conditioning. Regimen Consists of:  1. Fludarabine 30 mg/m2 days -6, -5, -4, -3, -2  2. Busulfan 3.2 mg/kg days -5, -4    Donor: 8/8, ABO O+, CMV positive  Recipient A+ and CMV+  Engraftment: G-CSF starting Day + 5 through WBC recovery (as defined as ANC 1.0 x 2 days or 3.0 x 1 day)  -Date of last G-CSF injection: TBD    GVHD prophylaxis:   1. Cyclophosphamide 50 mg/kg IV daily on days +3 and +4 with mesna  2. Tacrolimus to goal trough (5-10 ng/mL) starting day +5  3. Mycophenolate mofetil 15 mg/kg PO TID (max 1g PO TID) starting day +5 through day +35     Hem:   Transfusion criteria: Transfuse 1 unit of PRBCs for hemoglobin <7 and 1 unit of platelets for Plt <10K or bleeding. [name]. Ilg does not have a history of transfusion reactions. Consent was obtained and documented on 08/15/23.    ID:   Prophylaxis:  -Antiviral: Valtrex 500 mg po, increased  to BID on admit and continue through 2 years post transplant                   Letermovir 480 mg po to start at day +15 or hospital discharge and continue through day +200  -Antifungal: Fluconazole 400 mg po daily, continue through day +75  -Antibacterial: Levaquin 500 mg po daily until no longer neutropenic  -PJP: Bactrim DS once daily MWF upon platelet engraftment.     GI:   GERD prophylaxis:  -Home med: Prilosec 40 mg daily. Will switch to pantoprazole while inpatient    Nausea:  -Anti-emetics per BMT protocol.     Hx of IBS with chronic alternating diarrhea/constipation  -Has managed with changing doses of Questran; increased to 1 packet BID in 02/2023 with improvement of diarrhea; self tapered to 0.5 packets BID  -Reports predominantly constipation concerns but would have occasional accidents; says he hasn't soiled himself in months with current dose  -Uses docusate only if no BM 3-4 days  -Meticulously self tracks and titrates medication  -Continue Questran dosing on admission with prn docusate; likely will need to hold if he develops diarrhea  -10/1: Changing Questran to PRN  -10/2: Cdiff negative    Diarrhea:  -10/2: Cdiff negative  -10/4: Imodium QID    Renal: Creatinine normal. No issues.   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 SOS prophylaxis not indicated for RIC regimen.     CV:   -Hx of A-fib with transplant in 2020; required diltiazem+metoprolol; resolved post acute episode     * Continues on metoprolol mainly for BP.      * Plan for cardiac electrolytes    -HTN: has discontinued diltiazem prior to the start of venetoclax (for DDI) currently managed with metoprolol monotherapy; decreased to 50 mg/day ~1-2 months ago  -9/20: Adding norvasc 5mg  daily for persistent HTN    Pulm: No current issues.    ENT:  - Chronic cough when supine from PND? Try flonase or saline spray    GU:   Hx of BPH, previously on flomax will restart 10/2 (having some urinary urgency and incontinence due to mobility issues)   Hold sildenafil (for ED)    Endocrine:  Hx of steroid induced hyperglycemia not currently on medications    Neuro/Pain: No issues     Psych: CCSP consult prn.     Deconditioning:  - PT/OT consulted  - Uses a cane for walking.     Mitzi Hansen, AGNP  Bone [facility]row Transplant [facility] Cellular Therapy Program

## 2023-08-31 NOTE — Unmapped (Signed)
Patient A&O X4, VSS, afebrile throughout shift. PRNs for shift include Imodium. No reports of any pain or discomforts. Received a unit of Plts, no reactions occurred. No acute events throughout shift. Safety measures in place throughout shift.       Problem: Adult Inpatient Plan of Care  Goal: Plan of Care Review  Outcome: Ongoing - Unchanged  Goal: Patient-Specific Goal (Individualized)  Outcome: Ongoing - Unchanged  Goal: Absence of Hospital-Acquired Illness or Injury  Outcome: Ongoing - Unchanged  Intervention: Identify and Manage Fall Risk  Recent Flowsheet Documentation  Taken 08/30/2023 0717 by Keene Breath, RN  Safety Interventions:   environmental modification   infection management   lighting adjusted for tasks/safety   low bed   nonskid shoes/slippers when out of bed  Intervention: Prevent Skin Injury  Recent Flowsheet Documentation  Taken 08/30/2023 0717 by Keene Breath, RN  Positioning for Skin: Supine/Back  Device Skin Pressure Protection:   tubing/devices free from skin contact   adhesive use limited  Skin Protection:   adhesive use limited   transparent dressing maintained   tubing/devices free from skin contact  Intervention: Prevent Infection  Recent Flowsheet Documentation  Taken 08/30/2023 0717 by Keene Breath, RN  Infection Prevention:   cohorting utilized   environmental surveillance performed   equipment surfaces disinfected   hand hygiene promoted   personal protective equipment utilized   rest/sleep promoted   single patient room provided   visitors restricted/screened  Goal: Optimal Comfort and Wellbeing  Outcome: Ongoing - Unchanged  Goal: Readiness for Transition of Care  Outcome: Ongoing - Unchanged  Goal: Rounds/Family Conference  Outcome: Ongoing - Unchanged     Problem: Infection  Goal: Absence of Infection Signs and Symptoms  Outcome: Ongoing - Unchanged  Intervention: Prevent or Manage Infection  Recent Flowsheet Documentation  Taken 08/30/2023 0717 by Keene Breath, RN  Infection Management: aseptic technique maintained  Isolation Precautions: protective precautions maintained     Problem: Fall Injury Risk  Goal: Absence of Fall and Fall-Related Injury  Outcome: Ongoing - Unchanged  Intervention: Promote Injury-Free Environment  Recent Flowsheet Documentation  Taken 08/30/2023 0717 by Keene Breath, RN  Safety Interventions:   environmental modification   infection management   lighting adjusted for tasks/safety   low bed   nonskid shoes/slippers when out of bed     Problem: Fall Injury Risk  Goal: Absence of Fall and Fall-Related Injury  Outcome: Ongoing - Unchanged  Intervention: Promote Injury-Free Environment  Recent Flowsheet Documentation  Taken 08/30/2023 0717 by Keene Breath, RN  Safety Interventions:   environmental modification   infection management   lighting adjusted for tasks/safety   low bed   nonskid shoes/slippers when out of bed     Problem: Comorbidity Management  Goal: Blood Pressure in Desired Range  Outcome: Ongoing - Unchanged     Problem: Self-Care Deficit  Goal: Improved Ability to Complete Activities of Daily Living  Outcome: Ongoing - Unchanged

## 2023-09-01 LAB — CBC W/ AUTO DIFF
BASOPHILS ABSOLUTE COUNT: 0 10*9/L (ref 0.0–0.1)
BASOPHILS RELATIVE PERCENT: 0 %
EOSINOPHILS ABSOLUTE COUNT: 0 10*9/L (ref 0.0–0.5)
EOSINOPHILS RELATIVE PERCENT: 1.6 %
HEMATOCRIT: 26 % — ABNORMAL LOW (ref 39.0–48.0)
HEMOGLOBIN: 9.3 g/dL — ABNORMAL LOW (ref 12.9–16.5)
LYMPHOCYTES ABSOLUTE COUNT: 0 10*9/L — ABNORMAL LOW (ref 1.1–3.6)
LYMPHOCYTES RELATIVE PERCENT: 80.5 %
MEAN CORPUSCULAR HEMOGLOBIN CONC: 35.7 g/dL (ref 32.0–36.0)
MEAN CORPUSCULAR HEMOGLOBIN: 34.6 pg — ABNORMAL HIGH (ref 25.9–32.4)
MEAN CORPUSCULAR VOLUME: 96.8 fL — ABNORMAL HIGH (ref 77.6–95.7)
MEAN PLATELET VOLUME: 9.3 fL (ref 6.8–10.7)
MONOCYTES ABSOLUTE COUNT: 0 10*9/L — ABNORMAL LOW (ref 0.3–0.8)
MONOCYTES RELATIVE PERCENT: 10.9 %
NEUTROPHILS ABSOLUTE COUNT: 0 10*9/L — CL (ref 1.8–7.8)
NEUTROPHILS RELATIVE PERCENT: 7 %
PLATELET COUNT: 10 10*9/L — ABNORMAL LOW (ref 150–450)
RED BLOOD CELL COUNT: 2.69 10*12/L — ABNORMAL LOW (ref 4.26–5.60)
RED CELL DISTRIBUTION WIDTH: 13.8 % (ref 12.2–15.2)
WHITE BLOOD CELL COUNT: <0.1 10*9/L — CL (ref 3.6–11.2)

## 2023-09-01 LAB — BASIC METABOLIC PANEL
ANION GAP: 4 mmol/L — ABNORMAL LOW (ref 5–14)
BLOOD UREA NITROGEN: 24 mg/dL — ABNORMAL HIGH (ref 9–23)
BUN / CREAT RATIO: 33
CALCIUM: 8.4 mg/dL — ABNORMAL LOW (ref 8.7–10.4)
CHLORIDE: 108 mmol/L — ABNORMAL HIGH (ref 98–107)
CO2: 28 mmol/L (ref 20.0–31.0)
CREATININE: 0.72 mg/dL — ABNORMAL LOW
EGFR CKD-EPI (2021) MALE: >90 mL/min/{1.73_m2} (ref >=60)
GLUCOSE RANDOM: 115 mg/dL (ref 70–179)
POTASSIUM: 4.1 mmol/L (ref 3.4–4.8)
SODIUM: 140 mmol/L (ref 135–145)

## 2023-09-01 LAB — MAGNESIUM: MAGNESIUM: 1.8 mg/dL (ref 1.6–2.6)

## 2023-09-01 MED ADMIN — cholecalciferol (vitamin D3 25 mcg (1,000 units)) tablet 25 mcg: 25 ug | ORAL | @ 12:00:00

## 2023-09-01 MED ADMIN — loperamide (IMODIUM) capsule 2 mg: 2 mg | ORAL | @ 01:00:00

## 2023-09-01 MED ADMIN — tamsulosin (FLOMAX) 24 hr capsule 0.4 mg: .4 mg | ORAL | @ 01:00:00

## 2023-09-01 MED ADMIN — loperamide (IMODIUM) capsule 2 mg: 2 mg | ORAL | @ 10:00:00

## 2023-09-01 MED ADMIN — loratadine (CLARITIN) tablet 10 mg: 10 mg | ORAL | @ 12:00:00

## 2023-09-01 MED ADMIN — tacrolimus (PROGRAF) capsule 4 mg: .03 mg/kg | ORAL | @ 12:00:00

## 2023-09-01 MED ADMIN — valACYclovir (VALTREX) tablet 500 mg: 500 mg | ORAL | @ 01:00:00

## 2023-09-01 MED ADMIN — calcium carbonate (TUMS) chewable tablet 400 mg elem calcium: 400 mg | ORAL | @ 01:00:00

## 2023-09-01 MED ADMIN — loperamide (IMODIUM) capsule 2 mg: 2 mg | ORAL | @ 15:00:00

## 2023-09-01 MED ADMIN — tacrolimus (PROGRAF) capsule 4 mg: .03 mg/kg | ORAL | @ 01:00:00

## 2023-09-01 MED ADMIN — loperamide (IMODIUM) capsule 2 mg: 2 mg | ORAL | @ 19:00:00

## 2023-09-01 MED ADMIN — valACYclovir (VALTREX) tablet 500 mg: 500 mg | ORAL | @ 12:00:00

## 2023-09-01 MED ADMIN — filgrastim-ayow (RELEUKO) injection syringe 480 mcg: 480 ug | SUBCUTANEOUS | @ 01:00:00

## 2023-09-01 MED ADMIN — loperamide (IMODIUM) capsule 2 mg: 2 mg | ORAL | @ 21:00:00

## 2023-09-01 MED ADMIN — sodium chloride 0.9% (NS) bolus 1,000 mL: 1000 mL | INTRAVENOUS | @ 15:00:00 | Stop: 2023-09-01

## 2023-09-01 MED ADMIN — loperamide (IMODIUM) capsule 2 mg: 2 mg | ORAL | @ 12:00:00

## 2023-09-01 MED ADMIN — diphenoxylate-atropine (LOMOTIL) 2.5-0.025 mg per tablet 1 tablet: 1 | ORAL | @ 18:00:00

## 2023-09-01 MED ADMIN — metoPROLOL succinate (Toprol-XL) 24 hr tablet 50 mg: 50 mg | ORAL | @ 12:00:00

## 2023-09-01 MED ADMIN — pantoprazole (Protonix) EC tablet 40 mg: 40 mg | ORAL | @ 12:00:00

## 2023-09-01 MED ADMIN — calcium carbonate (TUMS) chewable tablet 400 mg elem calcium: 400 mg | ORAL | @ 18:00:00

## 2023-09-01 MED ADMIN — fluticasone propionate (FLONASE) 50 mcg/actuation nasal spray 2 spray: 2 | NASAL | @ 12:00:00

## 2023-09-01 MED ADMIN — magnesium sulfate 2gm/50mL IVPB: 2 g | INTRAVENOUS | @ 07:00:00

## 2023-09-01 MED ADMIN — mycophenolate (CELLCEPT) tablet 1,000 mg: 1000 mg | ORAL | @ 18:00:00

## 2023-09-01 MED ADMIN — mycophenolate (CELLCEPT) tablet 1,000 mg: 1000 mg | ORAL | @ 01:00:00

## 2023-09-01 MED ADMIN — cyanocobalamin (vitamin B-12) tablet 2,000 mcg: 2000 ug | ORAL | @ 12:00:00

## 2023-09-01 MED ADMIN — mycophenolate (CELLCEPT) tablet 1,000 mg: 1000 mg | ORAL | @ 12:00:00

## 2023-09-01 MED ADMIN — amlodipine (NORVASC) tablet 10 mg: 10 mg | ORAL | @ 12:00:00

## 2023-09-01 MED ADMIN — fluconazole (DIFLUCAN) tablet 400 mg: 400 mg | ORAL | @ 12:00:00 | Stop: 2023-09-21

## 2023-09-01 MED ADMIN — levoFLOXacin (LEVAQUIN) tablet 500 mg: 500 mg | ORAL | @ 12:00:00 | Stop: 2023-09-05

## 2023-09-01 NOTE — Unmapped (Signed)
Bone Marrow Transplant and Cellular Therapy Program  Inpatient Progress Note    Patient Name: Timothy Porter  MRN: [id number]  Encounter Date: 09/01/23    [facility] Myeloma Physician: Dr Vivien Presto Leukemia Physician: Dr. [name]  BMT Attending MD: 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  Type of Transplant: RIC MRD Allo  Graft Source: Fresh PBSCs  Transplant Day: 1556 (Autologous PBSC Unknown Phase - Planned 05/29/2019), 10 (Allogeneic PBSC Unknown Phase - Planned 08/22/2023)     Interval History:  - No acute events overnight  - Had increased diarrhea with a large bowel movement this morning at 0430  - He denies any nausea or vomiting   - Appetite has not been as great but wife is bringing him food today  - Drinking 1L a day, giving 1L NS today  - Remain afebrile, VSS    Review of Systems:  A comprehensive ROS performed and is negative except for pertinent positives as listed above in interval history.     Objective:  Wt Readings from Last 1 Encounters:   08/31/23 (!) 120.9 kg (266 lb 8.6 oz)     Temp Readings from Last 1 Encounters:   09/01/23 36.5 ??C (97.7 ??F) (Oral)     BP Readings from Last 1 Encounters:   09/01/23 148/82     Pulse Readings from Last 1 Encounters:   09/01/23 106     SpO2 Readings from Last 1 Encounters:   09/01/23 98%     80, Normal activity with effort; some signs or symptoms of disease (ECOG equivalent 1)    Physical Exam:   General: no acute distress noted.   Central venous access: Line clean, dry, intact. No erythema or drainage noted.   ENT: Moist mucous membranes. Oropharhynx without lesions, erythema or exudate.   Cardiovascular: Pulse normal rate, regularity and rhythm. S1 and S2 normal, without any murmur, rub, or gallop.  Lungs: Clear to auscultation bilaterally, without wheezes/crackles/rhonchi. Good air movement.   Skin: Warm, dry, intact. No rash noted.    Psychiatry: Alert and oriented to person, place, and time. Gastrointestinal/Abdomen: Normoactive bowel sounds, abdomen soft, non-tender   Musculoskeletal/Extremities: FROM throughout. No edema.  Neurologic: CNII-XII intact. Normal strength and sensation throughout    Test Results:   I reviewed all labs from today in Epic. See EMR for lab results.    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  RIC Flu/Bu Conditioning. Regimen Consists of:  1. Fludarabine 30 mg/m2 days -6, -5, -4, -3, -2  2. Busulfan 3.2 mg/kg days -5, -4    Donor: 8/8, ABO O+, CMV positive  Recipient A+ and CMV+  Engraftment: G-CSF starting Day + 5 through WBC recovery (as defined as ANC 1.0 x 2 days or 3.0 x 1 day)  -Date of last G-CSF injection: TBD    GVHD prophylaxis:   1. Cyclophosphamide 50 mg/kg IV daily on days +3 and +4 with mesna  2. Tacrolimus to goal trough (5-10 ng/mL) starting day +5  3. Mycophenolate mofetil 15 mg/kg PO TID (max 1g PO TID) starting day +5 through day +35     Hem:   Transfusion criteria: Transfuse 1 unit of PRBCs for hemoglobin <7 and 1 unit of platelets for Plt <10K or bleeding. [name]. [name] does not have a history  of transfusion reactions. Consent was obtained and documented on 08/15/23.    ID:   Prophylaxis:  -Antiviral: Valtrex 500 mg po, increased to BID on admit and continue through 2 years post transplant                   Letermovir 480 mg po to start at day +15 or hospital discharge and continue through day +200  -Antifungal: Fluconazole 400 mg po daily, continue through day +75  -Antibacterial: Levaquin 500 mg po daily until no longer neutropenic  -PJP: Bactrim DS once daily MWF upon platelet engraftment.     GI:   GERD prophylaxis:  -Home med: Prilosec 40 mg daily. Will switch to pantoprazole while inpatient    Nausea:  -Anti-emetics per BMT protocol.     Hx of IBS with chronic alternating diarrhea/constipation  -Has managed with changing doses of Questran; increased to 1 packet BID in 02/2023 with improvement of diarrhea; self tapered to 0.5 packets BID  -Reports predominantly constipation concerns but would have occasional accidents; says he hasn't soiled himself in months with current dose  -Uses docusate only if no BM 3-4 days  -Meticulously self tracks and titrates medication  -Continue Questran dosing on admission with prn docusate; likely will need to hold if he develops diarrhea  -10/1: Changing Questran to PRN  -10/2: Cdiff negative    Diarrhea:  -10/2: Cdiff negative  -10/4: Imodium QID  -10/5: Added Lomotil q6 hours     Renal: Creatinine normal. No issues.   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 SOS prophylaxis not indicated for RIC regimen.     CV:   -Hx of A-fib with transplant in 2020; required diltiazem+metoprolol; resolved post acute episode     * Continues on metoprolol mainly for BP.      * Plan for cardiac electrolytes    -HTN: has discontinued diltiazem prior to the start of venetoclax (for DDI) currently managed with metoprolol monotherapy; decreased to 50 mg/day ~1-2 months ago  -9/20: Adding norvasc 5mg  daily for persistent HTN    Pulm: No current issues.    ENT:  - Chronic cough when supine from PND? Try flonase or saline spray    GU:   Hx of BPH, previously on flomax will restart 10/2 (having some urinary urgency and incontinence due to mobility issues)   Hold sildenafil (for ED)    Endocrine:  Hx of steroid induced hyperglycemia not currently on medications    Neuro/Pain: No issues     Psych: CCSP consult prn.     Deconditioning:  - PT/OT consulted  - Uses a cane for walking.     Mitzi Hansen, AGNP  Bone Marrow Transplant [facility] Cellular Therapy Program

## 2023-09-01 NOTE — Unmapped (Addendum)
Patient A&O X4, VSS, afebrile throughout shift. PRN Imodium was given during the shift. Received 1L of NS bolus. No acute events throughout shift. Safety measures in place throughout shift.       Problem: Adult Inpatient Plan of Care  Goal: Plan of Care Review  Outcome: Ongoing - Unchanged  Goal: Patient-Specific Goal (Individualized)  Outcome: Ongoing - Unchanged  Goal: Absence of Hospital-Acquired Illness or Injury  Outcome: Ongoing - Unchanged  Intervention: Identify and Manage Fall Risk  Recent Flowsheet Documentation  Taken 09/01/2023 0714 by Keene Breath, RN  Safety Interventions:   bleeding precautions   chemotherapeutic agent precautions   environmental modification   infection management   isolation precautions   lighting adjusted for tasks/safety   low bed   neutropenic precautions   nonskid shoes/slippers when out of bed  Intervention: Prevent Skin Injury  Recent Flowsheet Documentation  Taken 09/01/2023 0714 by Keene Breath, RN  Positioning for Skin: Supine/Back  Device Skin Pressure Protection:   tubing/devices free from skin contact   adhesive use limited  Skin Protection:   adhesive use limited   transparent dressing maintained   tubing/devices free from skin contact  Intervention: Prevent Infection  Recent Flowsheet Documentation  Taken 09/01/2023 0714 by Keene Breath, RN  Infection Prevention:   cohorting utilized   environmental surveillance performed   equipment surfaces disinfected   hand hygiene promoted   personal protective equipment utilized   rest/sleep promoted   single patient room provided   visitors restricted/screened  Goal: Optimal Comfort and Wellbeing  Outcome: Ongoing - Unchanged  Goal: Readiness for Transition of Care  Outcome: Ongoing - Unchanged  Goal: Rounds/Family Conference  Outcome: Ongoing - Unchanged     Problem: Infection  Goal: Absence of Infection Signs and Symptoms  Outcome: Ongoing - Unchanged  Intervention: Prevent or Manage Infection  Recent Flowsheet Documentation  Taken 09/01/2023 0714 by Keene Breath, RN  Infection Management: aseptic technique maintained  Isolation Precautions: protective precautions maintained     Problem: Fall Injury Risk  Goal: Absence of Fall and Fall-Related Injury  Outcome: Ongoing - Unchanged  Intervention: Promote Injury-Free Environment  Recent Flowsheet Documentation  Taken 09/01/2023 0714 by Keene Breath, RN  Safety Interventions:   bleeding precautions   chemotherapeutic agent precautions   environmental modification   infection management   isolation precautions   lighting adjusted for tasks/safety   low bed   neutropenic precautions   nonskid shoes/slippers when out of bed     Problem: Fall Injury Risk  Goal: Absence of Fall and Fall-Related Injury  Outcome: Ongoing - Unchanged  Intervention: Promote Injury-Free Environment  Recent Flowsheet Documentation  Taken 09/01/2023 0714 by Keene Breath, RN  Safety Interventions:   bleeding precautions   chemotherapeutic agent precautions   environmental modification   infection management   isolation precautions   lighting adjusted for tasks/safety   low bed   neutropenic precautions   nonskid shoes/slippers when out of bed     Problem: Comorbidity Management  Goal: Blood Pressure in Desired Range  Outcome: Ongoing - Unchanged     Problem: Self-Care Deficit  Goal: Improved Ability to Complete Activities of Daily Living  Outcome: Ongoing - Unchanged

## 2023-09-01 NOTE — Unmapped (Signed)
Patient at nadir as expected. Diarrhea has improved on the Imodium. VSS no complaints of N/V or pain.       Problem: Adult Inpatient Plan of Care  Goal: Plan of Care Review  Outcome: Progressing  Goal: Patient-Specific Goal (Individualized)  Outcome: Progressing  Goal: Absence of Hospital-Acquired Illness or Injury  Outcome: Progressing  Intervention: Identify and Manage Fall Risk  Recent Flowsheet Documentation  Taken 08/31/2023 0730 by Noel Journey, RN  Safety Interventions:   assistive device   bleeding precautions   commode/urinal/bedpan at bedside   fall reduction program maintained   infection management   isolation precautions   low bed   neutropenic precautions   nonskid shoes/slippers when out of bed  Intervention: Prevent Skin Injury  Recent Flowsheet Documentation  Taken 08/31/2023 0730 by Noel Journey, RN  Positioning for Skin: Supine/Back  Skin Protection:   adhesive use limited   incontinence pads utilized   pouching devices used   protective footwear used   transparent dressing maintained   zinc oxide barrier cream  Intervention: Prevent and Manage VTE (Venous Thromboembolism) Risk  Recent Flowsheet Documentation  Taken 08/31/2023 0915 by Noel Journey, RN  VTE Prevention/Management:   ambulation promoted   bleeding precautions maintained   bleeding risk factors identified   fluids promoted  Intervention: Prevent Infection  Recent Flowsheet Documentation  Taken 08/31/2023 0730 by Noel Journey, RN  Infection Prevention:   cohorting utilized   environmental surveillance performed   equipment surfaces disinfected   hand hygiene promoted   single patient room provided   visitors restricted/screened   rest/sleep promoted   personal protective equipment utilized  Goal: Optimal Comfort and Wellbeing  Outcome: Progressing  Goal: Readiness for Transition of Care  Outcome: Progressing  Goal: Rounds/Family Conference  Outcome: Progressing     Problem: Infection  Goal: Absence of Infection Signs and Symptoms  Outcome: Progressing  Intervention: Prevent or Manage Infection  Recent Flowsheet Documentation  Taken 08/31/2023 0730 by Noel Journey, RN  Infection Management: aseptic technique maintained  Isolation Precautions: protective precautions maintained     Problem: Fall Injury Risk  Goal: Absence of Fall and Fall-Related Injury  Outcome: Progressing  Intervention: Promote Injury-Free Environment  Recent Flowsheet Documentation  Taken 08/31/2023 0730 by Noel Journey, RN  Safety Interventions:   assistive device   bleeding precautions   commode/urinal/bedpan at bedside   fall reduction program maintained   infection management   isolation precautions   low bed   neutropenic precautions   nonskid shoes/slippers when out of bed     Problem: Fall Injury Risk  Goal: Absence of Fall and Fall-Related Injury  Outcome: Progressing  Intervention: Promote Injury-Free Environment  Recent Flowsheet Documentation  Taken 08/31/2023 0730 by Noel Journey, RN  Safety Interventions:   assistive device   bleeding precautions   commode/urinal/bedpan at bedside   fall reduction program maintained   infection management   isolation precautions   low bed   neutropenic precautions   nonskid shoes/slippers when out of bed     Problem: Comorbidity Management  Goal: Blood Pressure in Desired Range  Outcome: Progressing     Problem: Self-Care Deficit  Goal: Improved Ability to Complete Activities of Daily Living  Outcome: Progressing

## 2023-09-01 NOTE — Unmapped (Signed)
Magnesium replaced overnight. PRN Imodium administered for diarrhea.     Problem: Adult Inpatient Plan of Care  Goal: Plan of Care Review  Outcome: Ongoing - Unchanged  Goal: Patient-Specific Goal (Individualized)  Outcome: Ongoing - Unchanged  Goal: Absence of Hospital-Acquired Illness or Injury  Outcome: Ongoing - Unchanged  Intervention: Identify and Manage Fall Risk  Recent Flowsheet Documentation  Taken 08/31/2023 1930 by Eusebio Me, RN  Safety Interventions:   bleeding precautions   commode/urinal/bedpan at bedside   environmental modification   fall reduction program maintained   infection management   isolation precautions   lighting adjusted for tasks/safety   low bed   mobility aid   neutropenic precautions   nonskid shoes/slippers when out of bed  Intervention: Prevent Skin Injury  Recent Flowsheet Documentation  Taken 08/31/2023 1930 by Eusebio Me, RN  Positioning for Skin: Supine/Back  Intervention: Prevent and Manage VTE (Venous Thromboembolism) Risk  Recent Flowsheet Documentation  Taken 08/31/2023 2100 by Eusebio Me, RN  VTE Prevention/Management:   fluids promoted   ambulation promoted  Intervention: Prevent Infection  Recent Flowsheet Documentation  Taken 08/31/2023 1930 by Eusebio Me, RN  Infection Prevention:   cohorting utilized   environmental surveillance performed   equipment surfaces disinfected   hand hygiene promoted   personal protective equipment utilized   rest/sleep promoted   single patient room provided   visitors restricted/screened  Goal: Optimal Comfort and Wellbeing  Outcome: Ongoing - Unchanged  Goal: Readiness for Transition of Care  Outcome: Ongoing - Unchanged  Goal: Rounds/Family Conference  Outcome: Ongoing - Unchanged     Problem: Infection  Goal: Absence of Infection Signs and Symptoms  Outcome: Ongoing - Unchanged  Intervention: Prevent or Manage Infection  Recent Flowsheet Documentation  Taken 08/31/2023 1930 by Eusebio Me, RN  Infection Management: aseptic technique maintained  Isolation Precautions: protective precautions maintained     Problem: Fall Injury Risk  Goal: Absence of Fall and Fall-Related Injury  Outcome: Ongoing - Unchanged  Intervention: Promote Injury-Free Environment  Recent Flowsheet Documentation  Taken 08/31/2023 1930 by Eusebio Me, RN  Safety Interventions:   bleeding precautions   commode/urinal/bedpan at bedside   environmental modification   fall reduction program maintained   infection management   isolation precautions   lighting adjusted for tasks/safety   low bed   mobility aid   neutropenic precautions   nonskid shoes/slippers when out of bed     Problem: Fall Injury Risk  Goal: Absence of Fall and Fall-Related Injury  Outcome: Ongoing - Unchanged  Intervention: Promote Injury-Free Environment  Recent Flowsheet Documentation  Taken 08/31/2023 1930 by Eusebio Me, RN  Safety Interventions:   bleeding precautions   commode/urinal/bedpan at bedside   environmental modification   fall reduction program maintained   infection management   isolation precautions   lighting adjusted for tasks/safety   low bed   mobility aid   neutropenic precautions   nonskid shoes/slippers when out of bed     Problem: Comorbidity Management  Goal: Blood Pressure in Desired Range  Outcome: Ongoing - Unchanged     Problem: Self-Care Deficit  Goal: Improved Ability to Complete Activities of Daily Living  Outcome: Ongoing - Unchanged

## 2023-09-02 LAB — CBC W/ AUTO DIFF
BASOPHILS ABSOLUTE COUNT: 0 10*9/L (ref 0.0–0.1)
BASOPHILS RELATIVE PERCENT: 0 %
EOSINOPHILS ABSOLUTE COUNT: 0 10*9/L (ref 0.0–0.5)
EOSINOPHILS RELATIVE PERCENT: 3.1 %
HEMATOCRIT: 22 % — ABNORMAL LOW (ref 39.0–48.0)
HEMOGLOBIN: 8.2 g/dL — ABNORMAL LOW (ref 12.9–16.5)
LYMPHOCYTES ABSOLUTE COUNT: 0 10*9/L — ABNORMAL LOW (ref 1.1–3.6)
LYMPHOCYTES RELATIVE PERCENT: 75 %
MEAN CORPUSCULAR HEMOGLOBIN CONC: 37.4 g/dL — ABNORMAL HIGH (ref 32.0–36.0)
MEAN CORPUSCULAR HEMOGLOBIN: 35.8 pg — ABNORMAL HIGH (ref 25.9–32.4)
MEAN CORPUSCULAR VOLUME: 95.8 fL — ABNORMAL HIGH (ref 77.6–95.7)
MEAN PLATELET VOLUME: 10 fL (ref 6.8–10.7)
MONOCYTES ABSOLUTE COUNT: 0 10*9/L — ABNORMAL LOW (ref 0.3–0.8)
MONOCYTES RELATIVE PERCENT: 15.6 %
NEUTROPHILS ABSOLUTE COUNT: 0 10*9/L — CL (ref 1.8–7.8)
NEUTROPHILS RELATIVE PERCENT: 6.3 %
PLATELET COUNT: 6 10*9/L — CL (ref 150–450)
RED BLOOD CELL COUNT: 2.3 10*12/L — ABNORMAL LOW (ref 4.26–5.60)
RED CELL DISTRIBUTION WIDTH: 13.5 % (ref 12.2–15.2)
WHITE BLOOD CELL COUNT: <0.1 10*9/L — CL (ref 3.6–11.2)

## 2023-09-02 LAB — BASIC METABOLIC PANEL
ANION GAP: 7 mmol/L (ref 5–14)
BLOOD UREA NITROGEN: 21 mg/dL (ref 9–23)
BUN / CREAT RATIO: 28
CALCIUM: 7.7 mg/dL — ABNORMAL LOW (ref 8.7–10.4)
CHLORIDE: 108 mmol/L — ABNORMAL HIGH (ref 98–107)
CO2: 25 mmol/L (ref 20.0–31.0)
CREATININE: 0.75 mg/dL
EGFR CKD-EPI (2021) MALE: >90 mL/min/{1.73_m2} (ref >=60)
GLUCOSE RANDOM: 111 mg/dL (ref 70–179)
POTASSIUM: 3.8 mmol/L (ref 3.4–4.8)
SODIUM: 140 mmol/L (ref 135–145)

## 2023-09-02 LAB — PLATELET COUNT: PLATELET COUNT: 29 10*9/L — ABNORMAL LOW (ref 150–450)

## 2023-09-02 LAB — MAGNESIUM: MAGNESIUM: 1.7 mg/dL (ref 1.6–2.6)

## 2023-09-02 MED ADMIN — sodium chloride 0.9% (NS) bolus 1,000 mL: 1000 mL | INTRAVENOUS | @ 01:00:00 | Stop: 2023-09-01

## 2023-09-02 MED ADMIN — loperamide (IMODIUM) capsule 4 mg: 4 mg | ORAL | @ 17:00:00

## 2023-09-02 MED ADMIN — fluticasone propionate (FLONASE) 50 mcg/actuation nasal spray 2 spray: 2 | NASAL | @ 13:00:00

## 2023-09-02 MED ADMIN — potassium chloride 20 mEq in 100 mL IVPB Premix: 20 meq | INTRAVENOUS | @ 10:00:00 | Stop: 2024-08-15

## 2023-09-02 MED ADMIN — loratadine (CLARITIN) tablet 10 mg: 10 mg | ORAL | @ 13:00:00

## 2023-09-02 MED ADMIN — loperamide (IMODIUM) capsule 2 mg: 2 mg | ORAL | @ 01:00:00

## 2023-09-02 MED ADMIN — magnesium sulfate 2gm/50mL IVPB: 2 g | INTRAVENOUS | @ 08:00:00

## 2023-09-02 MED ADMIN — mycophenolate (CELLCEPT) tablet 1,000 mg: 1000 mg | ORAL | @ 01:00:00

## 2023-09-02 MED ADMIN — tamsulosin (FLOMAX) 24 hr capsule 0.4 mg: .4 mg | ORAL | @ 01:00:00

## 2023-09-02 MED ADMIN — magnesium sulfate 2gm/50mL IVPB: 2 g | INTRAVENOUS | @ 06:00:00

## 2023-09-02 MED ADMIN — calcium carbonate (TUMS) chewable tablet 400 mg elem calcium: 400 mg | ORAL | @ 09:00:00

## 2023-09-02 MED ADMIN — saliva stimulant mucosal spray (BIOTENE): 1 | ORAL | @ 10:00:00

## 2023-09-02 MED ADMIN — valACYclovir (VALTREX) tablet 500 mg: 500 mg | ORAL | @ 13:00:00

## 2023-09-02 MED ADMIN — valACYclovir (VALTREX) tablet 500 mg: 500 mg | ORAL | @ 01:00:00

## 2023-09-02 MED ADMIN — loperamide (IMODIUM) capsule 2 mg: 2 mg | ORAL | @ 13:00:00 | Stop: 2023-09-02

## 2023-09-02 MED ADMIN — mycophenolate (CELLCEPT) tablet 1,000 mg: 1000 mg | ORAL | @ 17:00:00

## 2023-09-02 MED ADMIN — filgrastim-ayow (RELEUKO) injection syringe 480 mcg: 480 ug | SUBCUTANEOUS | @ 01:00:00

## 2023-09-02 MED ADMIN — tacrolimus (PROGRAF) capsule 4 mg: .03 mg/kg | ORAL | @ 13:00:00

## 2023-09-02 MED ADMIN — fluconazole (DIFLUCAN) tablet 400 mg: 400 mg | ORAL | @ 13:00:00 | Stop: 2023-09-21

## 2023-09-02 MED ADMIN — mycophenolate (CELLCEPT) tablet 1,000 mg: 1000 mg | ORAL | @ 13:00:00

## 2023-09-02 MED ADMIN — levoFLOXacin (LEVAQUIN) tablet 500 mg: 500 mg | ORAL | @ 13:00:00 | Stop: 2023-09-05

## 2023-09-02 MED ADMIN — diphenoxylate-atropine (LOMOTIL) 2.5-0.025 mg per tablet 1 tablet: 1 | ORAL | @ 17:00:00

## 2023-09-02 MED ADMIN — pantoprazole (Protonix) EC tablet 40 mg: 40 mg | ORAL | @ 13:00:00

## 2023-09-02 MED ADMIN — amlodipine (NORVASC) tablet 10 mg: 10 mg | ORAL | @ 13:00:00 | Stop: 2023-09-02

## 2023-09-02 MED ADMIN — calcium carbonate (TUMS) chewable tablet 400 mg elem calcium: 400 mg | ORAL | @ 17:00:00

## 2023-09-02 MED ADMIN — cholecalciferol (vitamin D3 25 mcg (1,000 units)) tablet 25 mcg: 25 ug | ORAL | @ 13:00:00

## 2023-09-02 MED ADMIN — diphenoxylate-atropine (LOMOTIL) 2.5-0.025 mg per tablet 1 tablet: 1 | ORAL | @ 01:00:00

## 2023-09-02 MED ADMIN — cyanocobalamin (vitamin B-12) tablet 2,000 mcg: 2000 ug | ORAL | @ 13:00:00

## 2023-09-02 MED ADMIN — sodium chloride 0.9% (NS) bolus 1,000 mL: 1000 mL | INTRAVENOUS | @ 20:00:00 | Stop: 2023-09-02

## 2023-09-02 MED ADMIN — sodium chloride 0.9% (NS) bolus 1,000 mL: 1000 mL | INTRAVENOUS | @ 15:00:00 | Stop: 2023-09-02

## 2023-09-02 MED ADMIN — tacrolimus (PROGRAF) capsule 4 mg: .03 mg/kg | ORAL | @ 01:00:00

## 2023-09-02 MED ADMIN — diphenoxylate-atropine (LOMOTIL) 2.5-0.025 mg per tablet 1 tablet: 1 | ORAL | @ 20:00:00

## 2023-09-02 MED ADMIN — loperamide (IMODIUM) capsule 4 mg: 4 mg | ORAL | @ 20:00:00

## 2023-09-02 MED ADMIN — calcium carbonate (TUMS) chewable tablet 400 mg elem calcium: 400 mg | ORAL | @ 01:00:00

## 2023-09-02 MED ADMIN — diphenoxylate-atropine (LOMOTIL) 2.5-0.025 mg per tablet 1 tablet: 1 | ORAL | @ 09:00:00

## 2023-09-02 NOTE — Unmapped (Signed)
Bone Marrow Transplant and Cellular Therapy Program  Inpatient Progress Note    Patient Name: Timothy Porter  MRN: [id number]  Encounter Date: 09/02/23    [facility] Myeloma Physician: Dr Vivien Presto Leukemia Physician: Dr. [name]  BMT Attending MD: 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 1 unit of Plt transfused for plt 6, rechecked 29  - Having watery loose diarrhea x4 over last 24 hours, continue with scheduled Imodium and Lomotil, increased Imodium to 40mg  Q6  - He denies any nausea or vomiting   - Appetite has not been as great but wife is bringing him food  - Drinking about 1L yesterday, 1L NS bolus this morning and re-assess this afternoon if more fluids needed   - Remain afebrile, VSS    Review of Systems:  A comprehensive ROS performed and is negative except for pertinent positives as listed above in interval history.     Objective:  Wt Readings from Last 1 Encounters:   09/01/23 (!) 121.1 kg (266 lb 15.6 oz)     Temp Readings from Last 1 Encounters:   09/02/23 36.7 ??C (98.1 ??F) (Oral)     BP Readings from Last 1 Encounters:   09/02/23 106/56     Pulse Readings from Last 1 Encounters:   09/02/23 96     SpO2 Readings from Last 1 Encounters:   09/02/23 97%     80, Normal activity with effort; some signs or symptoms of disease (ECOG equivalent 1)    Physical Exam:   General: no acute distress noted.   Central venous access: Line clean, dry, intact. No erythema or drainage noted.   ENT: Moist mucous membranes. Oropharhynx without lesions, erythema or exudate.   Cardiovascular: Pulse normal rate, regularity and rhythm. S1 and S2 normal, without any murmur, rub, or gallop.  Lungs: Clear to auscultation bilaterally, without wheezes/crackles/rhonchi. Good air movement.   Skin: Warm, dry, intact. No rash noted.    Psychiatry: Alert and oriented to person, place, and time.   Gastrointestinal/Abdomen: Normoactive bowel sounds, abdomen soft, non-tender   Musculoskeletal/Extremities: FROM throughout. No edema.  Neurologic: CNII-XII intact. Normal strength and sensation throughout    Test Results:   I reviewed all labs from today in Epic. See EMR for lab results.    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  RIC Flu/Bu Conditioning. Regimen Consists of:  1. Fludarabine 30 mg/m2 days -6, -5, -4, -3, -2  2. Busulfan 3.2 mg/kg days -5, -4    Donor: 8/8, ABO O+, CMV positive  Recipient A+ and CMV+  Engraftment: G-CSF starting Day + 5 through WBC recovery (as defined as ANC 1.0 x 2 days or 3.0 x 1 day)  -Date of last G-CSF injection: TBD    GVHD prophylaxis:   1. Cyclophosphamide 50 mg/kg IV daily on days +3 and +4 with mesna  2. Tacrolimus to goal trough (5-10 ng/mL) starting day +5  3. Mycophenolate mofetil 15 mg/kg PO TID (max  1g PO TID) starting day +5 through day +35     Hem:   Transfusion criteria: Transfuse 1 unit of PRBCs for hemoglobin <7 and 1 unit of platelets for Plt <10K or bleeding. [name]. Weichert does not have a history of transfusion reactions. Consent was obtained and documented on 08/15/23.    ID:   Prophylaxis:  -Antiviral: Valtrex 500 mg po, increased to BID on admit and continue through 2 years post transplant                   Letermovir 480 mg po to start at day +15 or hospital discharge and continue through day +200  -Antifungal: Fluconazole 400 mg po daily, continue through day +75  -Antibacterial: Levaquin 500 mg po daily until no longer neutropenic  -PJP: Bactrim DS once daily MWF upon platelet engraftment.     GI:   GERD prophylaxis:  -Home med: Prilosec 40 mg daily. Will switch to pantoprazole while inpatient    Nausea:  -Anti-emetics per BMT protocol.     Hx of IBS with chronic alternating diarrhea/constipation  -Has managed with changing doses of Questran; increased to 1 packet BID in 02/2023 with improvement of diarrhea; self tapered to 0.5 packets BID  -Reports predominantly constipation concerns but would have occasional accidents; says he hasn't soiled himself in months with current dose  -Uses docusate only if no BM 3-4 days  -Meticulously self tracks and titrates medication  -Continue Questran dosing on admission with prn docusate; likely will need to hold if he develops diarrhea  -10/1: Changing Questran to PRN  -10/2: Cdiff negative    Diarrhea:  -10/2: Cdiff negative  -10/4: Imodium QID  -10/5: Added Lomotil q6 hours   - 10/6: Loose/watery stool x4 over last 24 hours. Continue scheduled Imodium/Lomotil, increased Imodium to 4mg  Q6    Renal: Creatinine normal. No issues.   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 1L NS bolus given for low oral intake    Hepatic: Normal bilirubin. SOS prophylaxis not indicated for RIC regimen.     CV:   -Hx of A-fib with transplant in 2020; required diltiazem+metoprolol; resolved post acute episode     * Continues on metoprolol mainly for BP.      * Plan for cardiac electrolytes    -HTN: has discontinued diltiazem prior to the start of venetoclax (for DDI) currently managed with metoprolol monotherapy; decreased to 50 mg/day ~1-2 months ago  -9/20: Adding norvasc 5mg  daily for persistent HTN    Pulm: No current issues.    ENT:  - Chronic cough when supine from PND? Try flonase or saline spray    GU:   Hx of BPH, previously on flomax will restart 10/2 (having some urinary urgency and incontinence due to mobility issues)   Hold sildenafil (for ED)    Endocrine:  Hx of steroid induced hyperglycemia not currently on medications    Neuro/Pain: No issues     Psych: CCSP consult prn.     Deconditioning:  - PT/OT consulted  - Uses a cane for walking.     Tia Masker  Bone Marrow Transplant and Cellular Therapy Program

## 2023-09-02 NOTE — Unmapped (Signed)
Potassium, magnesium, and platelets replaced overnight. PRN Biotene administered for dry mouth. 1L NS bolus administered after standing orthostatic BP was 70/52.      Problem: Adult Inpatient Plan of Care  Goal: Plan of Care Review  Outcome: Ongoing - Unchanged  Goal: Patient-Specific Goal (Individualized)  Outcome: Ongoing - Unchanged  Goal: Absence of Hospital-Acquired Illness or Injury  Outcome: Ongoing - Unchanged  Intervention: Identify and Manage Fall Risk  Recent Flowsheet Documentation  Taken 09/01/2023 1945 by Eusebio Me, RN  Safety Interventions:   bleeding precautions   commode/urinal/bedpan at bedside   environmental modification   fall reduction program maintained   infection management   isolation precautions   lighting adjusted for tasks/safety   low bed   mobility aid   neutropenic precautions   nonskid shoes/slippers when out of bed  Intervention: Prevent Skin Injury  Recent Flowsheet Documentation  Taken 09/01/2023 1945 by Eusebio Me, RN  Positioning for Skin: Supine/Back  Intervention: Prevent and Manage VTE (Venous Thromboembolism) Risk  Recent Flowsheet Documentation  Taken 09/01/2023 2230 by Eusebio Me, RN  VTE Prevention/Management:   fluids promoted   ambulation promoted  Intervention: Prevent Infection  Recent Flowsheet Documentation  Taken 09/01/2023 1945 by Eusebio Me, RN  Infection Prevention:   cohorting utilized   environmental surveillance performed   equipment surfaces disinfected   hand hygiene promoted   personal protective equipment utilized   rest/sleep promoted   single patient room provided   visitors restricted/screened  Goal: Optimal Comfort and Wellbeing  Outcome: Ongoing - Unchanged  Goal: Readiness for Transition of Care  Outcome: Ongoing - Unchanged  Goal: Rounds/Family Conference  Outcome: Ongoing - Unchanged     Problem: Infection  Goal: Absence of Infection Signs and Symptoms  Outcome: Ongoing - Unchanged  Intervention: Prevent or Manage Infection  Recent Flowsheet Documentation  Taken 09/01/2023 1945 by Eusebio Me, RN  Infection Management: aseptic technique maintained  Isolation Precautions: protective precautions maintained     Problem: Fall Injury Risk  Goal: Absence of Fall and Fall-Related Injury  Outcome: Ongoing - Unchanged  Intervention: Promote Injury-Free Environment  Recent Flowsheet Documentation  Taken 09/01/2023 1945 by Eusebio Me, RN  Safety Interventions:   bleeding precautions   commode/urinal/bedpan at bedside   environmental modification   fall reduction program maintained   infection management   isolation precautions   lighting adjusted for tasks/safety   low bed   mobility aid   neutropenic precautions   nonskid shoes/slippers when out of bed     Problem: Fall Injury Risk  Goal: Absence of Fall and Fall-Related Injury  Outcome: Ongoing - Unchanged  Intervention: Promote Injury-Free Environment  Recent Flowsheet Documentation  Taken 09/01/2023 1945 by Eusebio Me, RN  Safety Interventions:   bleeding precautions   commode/urinal/bedpan at bedside   environmental modification   fall reduction program maintained   infection management   isolation precautions   lighting adjusted for tasks/safety   low bed   mobility aid   neutropenic precautions   nonskid shoes/slippers when out of bed     Problem: Comorbidity Management  Goal: Blood Pressure in Desired Range  Outcome: Ongoing - Unchanged     Problem: Self-Care Deficit  Goal: Improved Ability to Complete Activities of Daily Living  Outcome: Ongoing - Unchanged

## 2023-09-03 LAB — BASIC METABOLIC PANEL
ANION GAP: 8 mmol/L (ref 5–14)
BLOOD UREA NITROGEN: 19 mg/dL (ref 9–23)
BUN / CREAT RATIO: 26
CALCIUM: 7.8 mg/dL — ABNORMAL LOW (ref 8.7–10.4)
CHLORIDE: 110 mmol/L — ABNORMAL HIGH (ref 98–107)
CO2: 25 mmol/L (ref 20.0–31.0)
CREATININE: 0.72 mg/dL — ABNORMAL LOW
EGFR CKD-EPI (2021) MALE: >90 mL/min/{1.73_m2} (ref >=60)
GLUCOSE RANDOM: 119 mg/dL (ref 70–179)
POTASSIUM: 3.5 mmol/L (ref 3.4–4.8)
SODIUM: 143 mmol/L (ref 135–145)

## 2023-09-03 LAB — CBC W/ AUTO DIFF
BASOPHILS ABSOLUTE COUNT: 0 10*9/L (ref 0.0–0.1)
BASOPHILS RELATIVE PERCENT: 0 %
EOSINOPHILS ABSOLUTE COUNT: 0 10*9/L (ref 0.0–0.5)
EOSINOPHILS RELATIVE PERCENT: 0 %
HEMATOCRIT: 22 % — ABNORMAL LOW (ref 39.0–48.0)
HEMOGLOBIN: 8.2 g/dL — ABNORMAL LOW (ref 12.9–16.5)
LYMPHOCYTES ABSOLUTE COUNT: 0 10*9/L — ABNORMAL LOW (ref 1.1–3.6)
LYMPHOCYTES RELATIVE PERCENT: 54.2 %
MEAN CORPUSCULAR HEMOGLOBIN CONC: 37.2 g/dL — ABNORMAL HIGH (ref 32.0–36.0)
MEAN CORPUSCULAR HEMOGLOBIN: 35.4 pg — ABNORMAL HIGH (ref 25.9–32.4)
MEAN CORPUSCULAR VOLUME: 95.2 fL (ref 77.6–95.7)
MEAN PLATELET VOLUME: 7.8 fL (ref 6.8–10.7)
MONOCYTES ABSOLUTE COUNT: 0 10*9/L — ABNORMAL LOW (ref 0.3–0.8)
MONOCYTES RELATIVE PERCENT: 38.9 %
NEUTROPHILS ABSOLUTE COUNT: 0 10*9/L — CL (ref 1.8–7.8)
NEUTROPHILS RELATIVE PERCENT: 6.9 %
PLATELET COUNT: 23 10*9/L — ABNORMAL LOW (ref 150–450)
RED BLOOD CELL COUNT: 2.31 10*12/L — ABNORMAL LOW (ref 4.26–5.60)
RED CELL DISTRIBUTION WIDTH: 13 % (ref 12.2–15.2)
WHITE BLOOD CELL COUNT: <0.1 10*9/L — CL (ref 3.6–11.2)

## 2023-09-03 LAB — HEPATIC FUNCTION PANEL
ALBUMIN: 3.1 g/dL — ABNORMAL LOW (ref 3.4–5.0)
ALKALINE PHOSPHATASE: 124 U/L — ABNORMAL HIGH (ref 46–116)
ALT (SGPT): 92 U/L — ABNORMAL HIGH (ref 10–49)
AST (SGOT): 46 U/L — ABNORMAL HIGH (ref <=34)
BILIRUBIN DIRECT: 0.5 mg/dL — ABNORMAL HIGH (ref 0.00–0.30)
BILIRUBIN TOTAL: 1 mg/dL (ref 0.3–1.2)
PROTEIN TOTAL: 5.7 g/dL (ref 5.7–8.2)

## 2023-09-03 LAB — APTT
APTT: 27.5 s (ref 24.8–38.4)
HEPARIN CORRELATION: 0.2

## 2023-09-03 LAB — PHOSPHORUS: PHOSPHORUS: 2.6 mg/dL (ref 2.4–5.1)

## 2023-09-03 LAB — PROTIME-INR
INR: 1.12
PROTIME: 12.5 s (ref 9.9–12.6)

## 2023-09-03 LAB — LACTATE DEHYDROGENASE: LACTATE DEHYDROGENASE: 161 U/L (ref 120–246)

## 2023-09-03 LAB — CMV DNA, QUANTITATIVE, PCR: CMV VIRAL LD: NOT DETECTED

## 2023-09-03 LAB — TACROLIMUS LEVEL, TROUGH
TACROLIMUS, TROUGH: 4.1 ng/mL — ABNORMAL LOW (ref 5.0–15.0)
TACROLIMUS, TROUGH: 3.6 ng/mL — ABNORMAL LOW (ref 5.0–15.0)

## 2023-09-03 LAB — IONIZED CALCIUM VENOUS: CALCIUM IONIZED VENOUS (MG/DL): 4.09 mg/dL — ABNORMAL LOW (ref 4.40–5.40)

## 2023-09-03 LAB — MAGNESIUM: MAGNESIUM: 1.6 mg/dL (ref 1.6–2.6)

## 2023-09-03 LAB — EBV QUANTITATIVE PCR, BLOOD: EBV VIRAL LOAD RESULT: NOT DETECTED

## 2023-09-03 MED ADMIN — loratadine (CLARITIN) tablet 10 mg: 10 mg | ORAL | @ 13:00:00

## 2023-09-03 MED ADMIN — calcium gluconate in sodium chloride (NS) 0.9% 2 gram/100 mL IVPB 2 g: 2 g | INTRAVENOUS | @ 16:00:00 | Stop: 2023-09-03

## 2023-09-03 MED ADMIN — hydrALAZINE (APRESOLINE) injection 10 mg: 10 mg | INTRAVENOUS | @ 16:00:00

## 2023-09-03 MED ADMIN — loperamide (IMODIUM) capsule 4 mg: 4 mg | ORAL | @ 22:00:00

## 2023-09-03 MED ADMIN — diphenoxylate-atropine (LOMOTIL) 2.5-0.025 mg per tablet 1 tablet: 1 | ORAL | @ 01:00:00

## 2023-09-03 MED ADMIN — tacrolimus (PROGRAF) capsule 4 mg: .03 mg/kg | ORAL | @ 01:00:00

## 2023-09-03 MED ADMIN — mycophenolate (CELLCEPT) tablet 1,000 mg: 1000 mg | ORAL | @ 18:00:00

## 2023-09-03 MED ADMIN — mycophenolate (CELLCEPT) tablet 1,000 mg: 1000 mg | ORAL | @ 01:00:00

## 2023-09-03 MED ADMIN — calcium carbonate (TUMS) chewable tablet 400 mg elem calcium: 400 mg | ORAL | @ 09:00:00

## 2023-09-03 MED ADMIN — heparin, porcine (PF) 100 unit/mL injection 2 mL: 2 mL | INTRAVENOUS | @ 04:00:00

## 2023-09-03 MED ADMIN — calcium carbonate (TUMS) chewable tablet 400 mg elem calcium: 400 mg | ORAL | @ 18:00:00

## 2023-09-03 MED ADMIN — calcium carbonate (TUMS) chewable tablet 400 mg elem calcium: 400 mg | ORAL | @ 01:00:00

## 2023-09-03 MED ADMIN — fluconazole (DIFLUCAN) tablet 400 mg: 400 mg | ORAL | @ 13:00:00 | Stop: 2023-09-21

## 2023-09-03 MED ADMIN — diphenoxylate-atropine (LOMOTIL) 2.5-0.025 mg per tablet 1 tablet: 1 | ORAL | @ 16:00:00

## 2023-09-03 MED ADMIN — loperamide (IMODIUM) capsule 4 mg: 4 mg | ORAL | @ 16:00:00

## 2023-09-03 MED ADMIN — cholecalciferol (vitamin D3 25 mcg (1,000 units)) tablet 25 mcg: 25 ug | ORAL | @ 13:00:00

## 2023-09-03 MED ADMIN — magnesium sulfate 2gm/50mL IVPB: 2 g | INTRAVENOUS | @ 07:00:00

## 2023-09-03 MED ADMIN — diphenoxylate-atropine (LOMOTIL) 2.5-0.025 mg per tablet 1 tablet: 1 | ORAL | @ 22:00:00

## 2023-09-03 MED ADMIN — valACYclovir (VALTREX) tablet 500 mg: 500 mg | ORAL | @ 13:00:00

## 2023-09-03 MED ADMIN — levoFLOXacin (LEVAQUIN) tablet 500 mg: 500 mg | ORAL | @ 13:00:00 | Stop: 2023-09-17

## 2023-09-03 MED ADMIN — prochlorperazine (COMPAZINE) tablet 10 mg: 10 mg | ORAL

## 2023-09-03 MED ADMIN — mycophenolate (CELLCEPT) tablet 1,000 mg: 1000 mg | ORAL | @ 13:00:00

## 2023-09-03 MED ADMIN — metoPROLOL succinate (Toprol-XL) 24 hr tablet 50 mg: 50 mg | ORAL | @ 13:00:00

## 2023-09-03 MED ADMIN — sodium chloride 0.9% (NS) bolus 1,000 mL: 1000 mL | INTRAVENOUS | @ 16:00:00 | Stop: 2023-09-03

## 2023-09-03 MED ADMIN — filgrastim-ayow (RELEUKO) injection syringe 480 mcg: 480 ug | SUBCUTANEOUS | @ 01:00:00

## 2023-09-03 MED ADMIN — pantoprazole (Protonix) EC tablet 40 mg: 40 mg | ORAL | @ 13:00:00

## 2023-09-03 MED ADMIN — prochlorperazine (COMPAZINE) injection 10 mg: 10 mg | INTRAVENOUS | @ 01:00:00

## 2023-09-03 MED ADMIN — valACYclovir (VALTREX) tablet 500 mg: 500 mg | ORAL | @ 01:00:00

## 2023-09-03 MED ADMIN — tacrolimus (PROGRAF) capsule 4 mg: .03 mg/kg | ORAL | @ 13:00:00 | Stop: 2023-09-03

## 2023-09-03 MED ADMIN — potassium chloride 20 mEq in 100 mL IVPB Premix: 20 meq | INTRAVENOUS | @ 09:00:00 | Stop: 2024-08-15

## 2023-09-03 MED ADMIN — fluticasone propionate (FLONASE) 50 mcg/actuation nasal spray 2 spray: 2 | NASAL | @ 14:00:00

## 2023-09-03 MED ADMIN — magnesium sulfate 2gm/50mL IVPB: 2 g | INTRAVENOUS | @ 09:00:00

## 2023-09-03 MED ADMIN — loperamide (IMODIUM) capsule 4 mg: 4 mg | ORAL | @ 13:00:00

## 2023-09-03 MED ADMIN — diphenoxylate-atropine (LOMOTIL) 2.5-0.025 mg per tablet 1 tablet: 1 | ORAL | @ 09:00:00

## 2023-09-03 MED ADMIN — cyanocobalamin (vitamin B-12) tablet 2,000 mcg: 2000 ug | ORAL | @ 13:00:00

## 2023-09-03 MED ADMIN — tamsulosin (FLOMAX) 24 hr capsule 0.4 mg: .4 mg | ORAL | @ 01:00:00

## 2023-09-03 MED ADMIN — loperamide (IMODIUM) capsule 4 mg: 4 mg | ORAL | @ 01:00:00

## 2023-09-03 MED ADMIN — potassium chloride 20 mEq in 100 mL IVPB Premix: 20 meq | INTRAVENOUS | @ 08:00:00 | Stop: 2024-08-15

## 2023-09-03 MED ADMIN — potassium chloride 20 mEq in 100 mL IVPB Premix: 20 meq | INTRAVENOUS | @ 07:00:00 | Stop: 2024-08-15

## 2023-09-03 NOTE — Unmapped (Signed)
Tacrolimus Therapeutic Monitoring Pharmacy Note    Isaul Landi is a 77 y.o. male w/ MM who is day +12 from alloHSCT w/ RIC Bu/Flu conditioning and PT-Cy/Tac/MMF GVHD prophylaxis. He is continuing tacrolimus.    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     Date of Transplant:  08/22/23       Prior Dosing Information:  See table below      Source(s) of information used to determine prior to admission dosing: MAR    Goals:  Therapeutic Drug Levels  Tacrolimus trough goal:  5-10 ng/mL    Additional Clinical Monitoring/Outcomes  Monitor renal function (SCr and urine output) and liver function (LFTs)  Monitor for signs/symptoms of adverse events (e.g., hyperglycemia, hyperkalemia, hypomagnesemia, hypertension, headache, tremor)    Results:   Tacrolimus level: 3.6 ng/mL, drawn ~1.5 hours late    Creatinine   Date Value Ref Range Status   09/03/2023 0.72 (L) 0.73 - 1.18 mg/dL Final   16/08/9603 5.40 0.73 - 1.18 mg/dL Final   98/09/9146 8.29 (L) 0.73 - 1.18 mg/dL Final     Tacrolimus, Trough   Date Value Ref Range Status   09/03/2023 3.6 (L) 5.0 - 15.0 ng/mL Final   09/02/2023 4.1 (L) 5.0 - 15.0 ng/mL Final   08/31/2023 4.3 (L) 5.0 - 15.0 ng/mL Final      Pharmacokinetic Considerations and Significant Drug Interactions:  Concurrent hepatotoxic medications:  fluconazole  Concurrent CYP3A4 substrates/inhibitors:  fluconazole (letermovir to start day +15)  Concurrent nephrotoxic medications: None identified    Assessment/Plan:  Recommendation(s)  Tacrolimus trough remains subtherapeutic even after accounting for slightly late draw time  Renal function WNL  LFTs and Tbili WNL mildly elevated and trending upwards - will CTM at this time  He continues fluconazole, which was started on 9/25 and should be at steady state at this time. He will start letermovir on 10/10, so may see further level increases at that time via additional CYP3A4 inhibition  Increase tacrolimus to 5 mg PO BID given that trough continues to trend down while on 4 mg PO BID.  Plan to recheck 10/9 to assess trend    Follow-up  Next level has been ordered on 09/02/23 at 0800 . Dose should be near steady state at next lab draw.  A pharmacist will continue to monitor and recommend levels as appropriate    Longitudinal Dose Monitoring:  Date Dose (mg), Route AM Scr (mg/dL) Level  (ng/mL) Key Drug Interactions   9/30 5.5/5.5 0.84  Fluconazole   10/1 5.5/5.5 0.72  Fluconazole   10/2 4/4 0.69 4.4 Fluconazole   10/3 4/4 0.73  Fluconazole   10/4 4/4 0.73 4.3 Fluconazole   10/5 4/4 0.72  Fluconazole   10/6 4/4 0.75  Fluconazole   10/7 4/5 0.72 3.6 Fluconazole     Please page service pharmacist with questions/clarifications.    Bettey Costa, PharmD, BCPS, BCOP  BMT Clinical Pharmacist

## 2023-09-03 NOTE — Unmapped (Signed)
Day + 12. Vitals signs: remained WNL this shift. Pain: denies. Nausea/vomitting: prn iv compazine given x1 this shift. Diarrhea: improving with scheduled regimen. Blood/electrolyte replacements: 4gm magnesium, 60 meq potassium given per order parameters. Other: Tolerated scheduled medications, no falls or injuries, neutropenic precautions maintained. Pt is resting in bed quietly at this time.      Problem: Adult Inpatient Plan of Care  Goal: Plan of Care Review  Outcome: Ongoing - Unchanged  Goal: Patient-Specific Goal (Individualized)  Outcome: Ongoing - Unchanged  Goal: Absence of Hospital-Acquired Illness or Injury  Outcome: Ongoing - Unchanged  Intervention: Identify and Manage Fall Risk  Recent Flowsheet Documentation  Taken 09/02/2023 1930 by Rod Mae, RN  Safety Interventions: bleeding precautions  Intervention: Prevent Skin Injury  Recent Flowsheet Documentation  Taken 09/02/2023 1930 by Rod Mae, RN  Positioning for Skin: Supine/Back  Device Skin Pressure Protection: adhesive use limited  Skin Protection: adhesive use limited  Intervention: Prevent Infection  Recent Flowsheet Documentation  Taken 09/02/2023 1930 by Rod Mae, RN  Infection Prevention:   environmental surveillance performed   equipment surfaces disinfected   hand hygiene promoted   personal protective equipment utilized   rest/sleep promoted   single patient room provided   visitors restricted/screened  Goal: Optimal Comfort and Wellbeing  Outcome: Ongoing - Unchanged  Goal: Readiness for Transition of Care  Outcome: Ongoing - Unchanged  Goal: Rounds/Family Conference  Outcome: Ongoing - Unchanged     Problem: Infection  Goal: Absence of Infection Signs and Symptoms  Outcome: Ongoing - Unchanged  Intervention: Prevent or Manage Infection  Recent Flowsheet Documentation  Taken 09/02/2023 1930 by Rod Mae, RN  Infection Management: aseptic technique maintained  Isolation Precautions: protective precautions maintained Problem: Fall Injury Risk  Goal: Absence of Fall and Fall-Related Injury  Outcome: Ongoing - Unchanged  Intervention: Identify and Manage Contributors  Recent Flowsheet Documentation  Taken 09/02/2023 2041 by Rod Mae, RN  Self-Care Promotion: independence encouraged  Intervention: Promote Injury-Free Environment  Recent Flowsheet Documentation  Taken 09/02/2023 1930 by Rod Mae, RN  Safety Interventions: bleeding precautions     Problem: Fall Injury Risk  Goal: Absence of Fall and Fall-Related Injury  Outcome: Ongoing - Unchanged  Intervention: Identify and Manage Contributors  Recent Flowsheet Documentation  Taken 09/02/2023 2041 by Rod Mae, RN  Self-Care Promotion: independence encouraged  Intervention: Promote Injury-Free Environment  Recent Flowsheet Documentation  Taken 09/02/2023 1930 by Rod Mae, RN  Safety Interventions: bleeding precautions     Problem: Comorbidity Management  Goal: Blood Pressure in Desired Range  Outcome: Ongoing - Unchanged     Problem: Self-Care Deficit  Goal: Improved Ability to Complete Activities of Daily Living  Outcome: Ongoing - Unchanged  Intervention: Promote Activity and Functional Independence  Recent Flowsheet Documentation  Taken 09/02/2023 2041 by Rod Mae, RN  Self-Care Promotion: independence encouraged

## 2023-09-03 NOTE — Unmapped (Signed)
Patient A&O X4, VSS, afebrile throughout shift. No acute events throughout shift. Plan of care reviewed with patient and applicable visitors, safety measures in place throughout shift.      Problem: Adult Inpatient Plan of Care  Goal: Plan of Care Review  Outcome: Progressing  Goal: Patient-Specific Goal (Individualized)  Outcome: Progressing  Goal: Absence of Hospital-Acquired Illness or Injury  Outcome: Progressing  Intervention: Identify and Manage Fall Risk  Recent Flowsheet Documentation  Taken 09/03/2023 0725 by Lane Hacker, RN  Safety Interventions:   bleeding precautions   commode/urinal/bedpan at bedside   environmental modification   fall reduction program maintained   infection management   isolation precautions   lighting adjusted for tasks/safety   low bed   neutropenic precautions   nonskid shoes/slippers when out of bed  Intervention: Prevent Skin Injury  Recent Flowsheet Documentation  Taken 09/03/2023 0725 by Lane Hacker, RN  Positioning for Skin: Right  Device Skin Pressure Protection: adhesive use limited  Skin Protection:   adhesive use limited   transparent dressing maintained   tubing/devices free from skin contact  Intervention: Prevent Infection  Recent Flowsheet Documentation  Taken 09/03/2023 0725 by Lane Hacker, RN  Infection Prevention:   cohorting utilized   environmental surveillance performed   equipment surfaces disinfected   hand hygiene promoted   personal protective equipment utilized   rest/sleep promoted   single patient room provided   visitors restricted/screened  Goal: Optimal Comfort and Wellbeing  Outcome: Progressing  Goal: Readiness for Transition of Care  Outcome: Progressing  Goal: Rounds/Family Conference  Outcome: Progressing     Problem: Infection  Goal: Absence of Infection Signs and Symptoms  Outcome: Progressing  Intervention: Prevent or Manage Infection  Recent Flowsheet Documentation  Taken 09/03/2023 0725 by Lane Hacker, RN  Infection Management: aseptic technique maintained  Isolation Precautions: protective precautions maintained     Problem: Fall Injury Risk  Goal: Absence of Fall and Fall-Related Injury  Outcome: Progressing  Intervention: Promote Injury-Free Environment  Recent Flowsheet Documentation  Taken 09/03/2023 0725 by Lane Hacker, RN  Safety Interventions:   bleeding precautions   commode/urinal/bedpan at bedside   environmental modification   fall reduction program maintained   infection management   isolation precautions   lighting adjusted for tasks/safety   low bed   neutropenic precautions   nonskid shoes/slippers when out of bed     Problem: Fall Injury Risk  Goal: Absence of Fall and Fall-Related Injury  Outcome: Progressing  Intervention: Promote Injury-Free Environment  Recent Flowsheet Documentation  Taken 09/03/2023 0725 by Lane Hacker, RN  Safety Interventions:   bleeding precautions   commode/urinal/bedpan at bedside   environmental modification   fall reduction program maintained   infection management   isolation precautions   lighting adjusted for tasks/safety   low bed   neutropenic precautions   nonskid shoes/slippers when out of bed     Problem: Comorbidity Management  Goal: Blood Pressure in Desired Range  Outcome: Progressing     Problem: Self-Care Deficit  Goal: Improved Ability to Complete Activities of Daily Living  Outcome: Progressing

## 2023-09-03 NOTE — Unmapped (Signed)
Patient A&O X4, VSS, afebrile throughout shift. No PRNs were requested during the shift. No acute events throughout shift. Received 1L NS bolus twice. Safety measures in place throughout shift.       Problem: Adult Inpatient Plan of Care  Goal: Plan of Care Review  09/02/2023 1721 by Keene Breath, RN  Outcome: Ongoing - Unchanged  09/02/2023 1721 by Keene Breath, RN  Outcome: Ongoing - Unchanged  Goal: Patient-Specific Goal (Individualized)  09/02/2023 1721 by Keene Breath, RN  Outcome: Ongoing - Unchanged  09/02/2023 1721 by Keene Breath, RN  Outcome: Ongoing - Unchanged  Goal: Absence of Hospital-Acquired Illness or Injury  09/02/2023 1721 by Jamesetta Orleans T, RN  Outcome: Ongoing - Unchanged  09/02/2023 1721 by Keene Breath, RN  Outcome: Ongoing - Unchanged  Intervention: Identify and Manage Fall Risk  Recent Flowsheet Documentation  Taken 09/02/2023 0709 by Keene Breath, RN  Safety Interventions:   bleeding precautions   environmental modification   infection management   lighting adjusted for tasks/safety   low bed   neutropenic precautions   nonskid shoes/slippers when out of bed  Intervention: Prevent Skin Injury  Recent Flowsheet Documentation  Taken 09/02/2023 0709 by Keene Breath, RN  Positioning for Skin: Supine/Back  Device Skin Pressure Protection:   tubing/devices free from skin contact   adhesive use limited  Skin Protection:   adhesive use limited   transparent dressing maintained   tubing/devices free from skin contact  Intervention: Prevent Infection  Recent Flowsheet Documentation  Taken 09/02/2023 0709 by Keene Breath, RN  Infection Prevention:   cohorting utilized   environmental surveillance performed   equipment surfaces disinfected   hand hygiene promoted   personal protective equipment utilized   rest/sleep promoted   single patient room provided   visitors restricted/screened  Goal: Optimal Comfort and Wellbeing  09/02/2023 1721 by Keene Breath, RN  Outcome: Ongoing - Unchanged  09/02/2023 1721 by Jamesetta Orleans T, RN  Outcome: Ongoing - Unchanged  Goal: Readiness for Transition of Care  09/02/2023 1721 by Keene Breath, RN  Outcome: Ongoing - Unchanged  09/02/2023 1721 by Jamesetta Orleans T, RN  Outcome: Ongoing - Unchanged  Goal: Rounds/Family Conference  09/02/2023 1721 by Jamesetta Orleans T, RN  Outcome: Ongoing - Unchanged  09/02/2023 1721 by Jamesetta Orleans T, RN  Outcome: Ongoing - Unchanged     Problem: Infection  Goal: Absence of Infection Signs and Symptoms  09/02/2023 1721 by Jamesetta Orleans T, RN  Outcome: Ongoing - Unchanged  09/02/2023 1721 by Jamesetta Orleans T, RN  Outcome: Ongoing - Unchanged  Intervention: Prevent or Manage Infection  Recent Flowsheet Documentation  Taken 09/02/2023 0709 by Jamesetta Orleans T, RN  Infection Management: aseptic technique maintained  Isolation Precautions: protective precautions maintained     Problem: Fall Injury Risk  Goal: Absence of Fall and Fall-Related Injury  09/02/2023 1721 by Jamesetta Orleans T, RN  Outcome: Ongoing - Unchanged  09/02/2023 1721 by Keene Breath, RN  Outcome: Ongoing - Unchanged  Intervention: Promote Injury-Free Environment  Recent Flowsheet Documentation  Taken 09/02/2023 0709 by Keene Breath, RN  Safety Interventions:   bleeding precautions   environmental modification   infection management   lighting adjusted for tasks/safety   low bed   neutropenic precautions   nonskid shoes/slippers when out of bed     Problem: Fall Injury Risk  Goal: Absence of Fall and Fall-Related Injury  09/02/2023 1721 by Jamesetta Orleans  T, RN  Outcome: Ongoing - Unchanged  09/02/2023 1721 by Keene Breath, RN  Outcome: Ongoing - Unchanged  Intervention: Promote Injury-Free Environment  Recent Flowsheet Documentation  Taken 09/02/2023 0709 by Keene Breath, RN  Safety Interventions:   bleeding precautions   environmental modification   infection management   lighting adjusted for tasks/safety   low bed   neutropenic precautions   nonskid shoes/slippers when out of bed     Problem: Comorbidity Management  Goal: Blood Pressure in Desired Range  09/02/2023 1721 by Jamesetta Orleans T, RN  Outcome: Ongoing - Unchanged  09/02/2023 1721 by Jamesetta Orleans T, RN  Outcome: Ongoing - Unchanged     Problem: Self-Care Deficit  Goal: Improved Ability to Complete Activities of Daily Living  09/02/2023 1721 by Jamesetta Orleans T, RN  Outcome: Ongoing - Unchanged  09/02/2023 1721 by Keene Breath, RN  Outcome: Ongoing - Unchanged

## 2023-09-03 NOTE — Unmapped (Signed)
Bone Marrow Transplant and Cellular Therapy Program  Inpatient Progress Note    Patient Name: JAKHI DISHMAN  MRN: [id number]  Encounter Date: 09/03/23    [facility] Physician: Dr Vivien Presto Leukemia Physician: Dr. [name]  BMT Attending MD: 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  Type of Transplant: RIC MRD Allo  Graft Source: Fresh PBSCs  Transplant Day: 1558 (Autologous PBSC Unknown Phase - Planned 05/29/2019), 12 (Allogeneic PBSC Unknown Phase - Planned 08/22/2023)     Interval History:  - NAEON  - Continues to feel tired and weak  - HTN noted but given has significant orthostatic hypotension will hold off on restarting home BP meds added PRN hydralazine if needed  - Eating and drinking is poor, will support with IVF bolus  - Continues for have urgent loose BM 4 in the past 24 hours  - Remains on scheduled imodium  - LFTS elevated will continue to monitor and recheck tomorrow  - hypocalcemia noted remains on calcium carbonate tid and will give 2 g calcium gluc  - Nausea controlled received 1 dose of compazine ON    Review of Systems:  A comprehensive ROS performed and is negative except for pertinent positives as listed above in interval history.     Objective:  Wt Readings from Last 1 Encounters:   09/02/23 (!) 122.2 kg (269 lb 6.4 oz)     Temp Readings from Last 1 Encounters:   09/03/23 36.7 ??C (98.1 ??F) (Oral)     BP Readings from Last 1 Encounters:   09/03/23 141/84     Pulse Readings from Last 1 Encounters:   09/03/23 102     SpO2 Readings from Last 1 Encounters:   09/03/23 97%     80, Normal activity with effort; some signs or symptoms of disease (ECOG equivalent 1)    Physical Exam:   General: no acute distress noted.   Central venous access: Line clean, dry, intact. No erythema or drainage noted.   ENT: Moist mucous membranes. Oropharhynx without lesions, erythema or exudate.   Cardiovascular: Pulse normal rate, regularity and rhythm. S1 and S2 normal, without any murmur, rub, or gallop.  Lungs: Clear to auscultation bilaterally, without wheezes/crackles/rhonchi. Good air movement.   Skin: Warm, dry, intact. No rash noted.    Psychiatry: Alert and oriented to person, place, and time.   Gastrointestinal/Abdomen: Normoactive bowel sounds, abdomen soft, non-tender   Musculoskeletal/Extremities: FROM throughout. No edema.  Neurologic: CNII-XII intact. Normal strength and sensation throughout    Test Results:   I reviewed all labs from today in Epic. See EMR for lab results.    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  RIC Flu/Bu Conditioning. Regimen Consists of:  1. Fludarabine 30 mg/m2 days -6, -5, -4, -3, -2  2. Busulfan 3.2 mg/kg days -5, -4    Donor: 8/8, ABO O+, CMV positive  Recipient A+ and CMV+  Engraftment: G-CSF starting Day + 5 through WBC recovery (as defined as ANC 1.0 x 2 days or 3.0 x 1 day)  -Date of last G-CSF injection: TBD    GVHD prophylaxis:   1. Cyclophosphamide 50 mg/kg IV daily on days +3 and +4 with mesna  2. Tacrolimus to goal trough (5-10 ng/mL) starting day +5  3. Mycophenolate mofetil 15 mg/kg PO  TID (max 1g PO TID) starting day +5 through day +35     Hem:   Transfusion criteria: Transfuse 1 unit of PRBCs for hemoglobin <7 and 1 unit of platelets for Plt <10K or bleeding. [name]. Kingma does not have a history of transfusion reactions. Consent was obtained and documented on 08/15/23.    ID:   Prophylaxis:  -Antiviral: Valtrex 500 mg po, increased to BID on admit and continue through 2 years post transplant                   Letermovir 480 mg po to start at day +15 or hospital discharge and continue through day +200  -Antifungal: Fluconazole 400 mg po daily, continue through day +75  -Antibacterial: Levaquin 500 mg po daily until no longer neutropenic  -PJP: Bactrim DS once daily MWF upon platelet engraftment. GI:   GERD prophylaxis:  -Home med: Prilosec 40 mg daily. Will switch to pantoprazole while inpatient    Nausea:  -Anti-emetics per BMT protocol.     Hx of IBS with chronic alternating diarrhea/constipation  -Has managed with changing doses of Questran; increased to 1 packet BID in 02/2023 with improvement of diarrhea; self tapered to 0.5 packets BID  -Reports predominantly constipation concerns but would have occasional accidents; says he hasn't soiled himself in months with current dose  -Uses docusate only if no BM 3-4 days  -Meticulously self tracks and titrates medication  -Continue Questran dosing on admission with prn docusate; likely will need to hold if he develops diarrhea  -10/1: Changing Questran to PRN  -10/2: Cdiff negative    Diarrhea:  -10/2: Cdiff negative  -10/4: Imodium QID  -10/5: Added Lomotil q6 hours   - 10/6: Loose/watery stool x4 over last 24 hours. Continue scheduled Imodium/Lomotil, increased Imodium to 4mg  Q6    Renal: Creatinine normal. No issues.   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 SOS prophylaxis not indicated for RIC regimen.   --10/7 LFTs elevated recheck planned for tomorrow    CV:   -Hx of A-fib with transplant in 2020; required diltiazem+metoprolol; resolved post acute episode     * Continues on metoprolol mainly for BP.      * Plan for cardiac electrolytes    -HTN: has discontinued diltiazem prior to the start of venetoclax (for DDI) currently managed with metoprolol monotherapy; decreased to 50 mg/day ~1-2 months ago  -9/20: Adding norvasc 5mg  daily for persistent HTN  10/6 holding amlodipine  --Added PRN hydral    Pulm: No current issues.    ENT:  - Chronic cough when supine from PND? Try flonase or saline spray    GU:   Hx of BPH, previously on flomax will restart 10/2 (having some urinary urgency and incontinence due to mobility issues)   Hold sildenafil (for ED)    Endocrine:  Hx of steroid induced hyperglycemia not currently on medications    Neuro/Pain: No issues     Psych: CCSP consult prn.     Deconditioning:  - PT/OT consulted  - Uses a cane for walking.     Azalee Course Aldous Housel, FNP  Bone Marrow Transplant and Cellular Therapy Program

## 2023-09-04 LAB — BASIC METABOLIC PANEL
ANION GAP: 8 mmol/L (ref 5–14)
BLOOD UREA NITROGEN: 17 mg/dL (ref 9–23)
BUN / CREAT RATIO: 23
CALCIUM: 8 mg/dL — ABNORMAL LOW (ref 8.7–10.4)
CHLORIDE: 109 mmol/L — ABNORMAL HIGH (ref 98–107)
CO2: 23 mmol/L (ref 20.0–31.0)
CREATININE: 0.73 mg/dL
EGFR CKD-EPI (2021) MALE: >90 mL/min/{1.73_m2} (ref >=60)
GLUCOSE RANDOM: 113 mg/dL (ref 70–179)
POTASSIUM: 3.8 mmol/L (ref 3.4–4.8)
SODIUM: 140 mmol/L (ref 135–145)

## 2023-09-04 LAB — HEPATIC FUNCTION PANEL
ALBUMIN: 2.9 g/dL — ABNORMAL LOW (ref 3.4–5.0)
ALKALINE PHOSPHATASE: 144 U/L — ABNORMAL HIGH (ref 46–116)
ALT (SGPT): 78 U/L — ABNORMAL HIGH (ref 10–49)
AST (SGOT): 39 U/L — ABNORMAL HIGH (ref <=34)
BILIRUBIN DIRECT: 0.6 mg/dL — ABNORMAL HIGH (ref 0.00–0.30)
BILIRUBIN TOTAL: 1.2 mg/dL (ref 0.3–1.2)
PROTEIN TOTAL: 5.6 g/dL — ABNORMAL LOW (ref 5.7–8.2)

## 2023-09-04 LAB — CBC W/ AUTO DIFF
BASOPHILS ABSOLUTE COUNT: 0 10*9/L (ref 0.0–0.1)
BASOPHILS RELATIVE PERCENT: 0 %
EOSINOPHILS ABSOLUTE COUNT: 0 10*9/L (ref 0.0–0.5)
EOSINOPHILS RELATIVE PERCENT: 2.5 %
HEMATOCRIT: 22.4 % — ABNORMAL LOW (ref 39.0–48.0)
HEMOGLOBIN: 7.8 g/dL — ABNORMAL LOW (ref 12.9–16.5)
LYMPHOCYTES ABSOLUTE COUNT: 0 10*9/L — ABNORMAL LOW (ref 1.1–3.6)
LYMPHOCYTES RELATIVE PERCENT: 9.8 %
MEAN CORPUSCULAR HEMOGLOBIN CONC: 35 g/dL (ref 32.0–36.0)
MEAN CORPUSCULAR HEMOGLOBIN: 33.7 pg — ABNORMAL HIGH (ref 25.9–32.4)
MEAN CORPUSCULAR VOLUME: 96.3 fL — ABNORMAL HIGH (ref 77.6–95.7)
MEAN PLATELET VOLUME: 7.7 fL (ref 6.8–10.7)
MONOCYTES ABSOLUTE COUNT: 0 10*9/L — ABNORMAL LOW (ref 0.3–0.8)
MONOCYTES RELATIVE PERCENT: 42.2 %
NEUTROPHILS ABSOLUTE COUNT: 0.1 10*9/L — CL (ref 1.8–7.8)
NEUTROPHILS RELATIVE PERCENT: 45.5 %
PLATELET COUNT: 18 10*9/L — ABNORMAL LOW (ref 150–450)
RED BLOOD CELL COUNT: 2.32 10*12/L — ABNORMAL LOW (ref 4.26–5.60)
RED CELL DISTRIBUTION WIDTH: 13.3 % (ref 12.2–15.2)
WBC ADJUSTED: 0.1 10*9/L — CL (ref 3.6–11.2)

## 2023-09-04 LAB — MAGNESIUM: MAGNESIUM: 1.7 mg/dL (ref 1.6–2.6)

## 2023-09-04 MED ADMIN — metoPROLOL succinate (Toprol-XL) 24 hr tablet 50 mg: 50 mg | ORAL | @ 12:00:00

## 2023-09-04 MED ADMIN — diphenoxylate-atropine (LOMOTIL) 2.5-0.025 mg per tablet 1 tablet: 1 | ORAL | @ 10:00:00

## 2023-09-04 MED ADMIN — diphenoxylate-atropine (LOMOTIL) 2.5-0.025 mg per tablet 1 tablet: 1 | ORAL | @ 20:00:00

## 2023-09-04 MED ADMIN — cyanocobalamin (vitamin B-12) tablet 2,000 mcg: 2000 ug | ORAL | @ 12:00:00

## 2023-09-04 MED ADMIN — diphenoxylate-atropine (LOMOTIL) 2.5-0.025 mg per tablet 1 tablet: 1 | ORAL

## 2023-09-04 MED ADMIN — levoFLOXacin (LEVAQUIN) tablet 500 mg: 500 mg | ORAL | @ 12:00:00 | Stop: 2023-09-04

## 2023-09-04 MED ADMIN — loperamide (IMODIUM) capsule 4 mg: 4 mg | ORAL | @ 12:00:00

## 2023-09-04 MED ADMIN — diphenoxylate-atropine (LOMOTIL) 2.5-0.025 mg per tablet 1 tablet: 1 | ORAL | @ 17:00:00

## 2023-09-04 MED ADMIN — mycophenolate (CELLCEPT) tablet 1,000 mg: 1000 mg | ORAL | @ 18:00:00

## 2023-09-04 MED ADMIN — filgrastim-ayow (RELEUKO) injection syringe 480 mcg: 480 ug | SUBCUTANEOUS

## 2023-09-04 MED ADMIN — calcium carbonate (TUMS) chewable tablet 400 mg elem calcium: 400 mg | ORAL | @ 18:00:00

## 2023-09-04 MED ADMIN — loperamide (IMODIUM) capsule 4 mg: 4 mg | ORAL

## 2023-09-04 MED ADMIN — magnesium sulfate 2gm/50mL IVPB: 2 g | INTRAVENOUS | @ 08:00:00

## 2023-09-04 MED ADMIN — mycophenolate (CELLCEPT) tablet 1,000 mg: 1000 mg | ORAL | @ 12:00:00

## 2023-09-04 MED ADMIN — prochlorperazine (COMPAZINE) tablet 10 mg: 10 mg | ORAL | @ 17:00:00

## 2023-09-04 MED ADMIN — valACYclovir (VALTREX) tablet 500 mg: 500 mg | ORAL | @ 12:00:00

## 2023-09-04 MED ADMIN — magnesium sulfate 2gm/50mL IVPB: 2 g | INTRAVENOUS | @ 10:00:00

## 2023-09-04 MED ADMIN — sodium chloride 0.9% (NS) bolus 1,000 mL: 1000 mL | INTRAVENOUS | @ 14:00:00 | Stop: 2023-09-04

## 2023-09-04 MED ADMIN — tacrolimus (PROGRAF) capsule 5 mg: 5 mg | ORAL

## 2023-09-04 MED ADMIN — cholecalciferol (vitamin D3 25 mcg (1,000 units)) tablet 25 mcg: 25 ug | ORAL | @ 13:00:00

## 2023-09-04 MED ADMIN — pantoprazole (Protonix) EC tablet 40 mg: 40 mg | ORAL | @ 12:00:00

## 2023-09-04 MED ADMIN — potassium chloride 20 mEq in 100 mL IVPB Premix: 20 meq | INTRAVENOUS | @ 08:00:00 | Stop: 2024-08-15

## 2023-09-04 MED ADMIN — tacrolimus (PROGRAF) capsule 5 mg: 5 mg | ORAL | @ 12:00:00

## 2023-09-04 MED ADMIN — loratadine (CLARITIN) tablet 10 mg: 10 mg | ORAL | @ 12:00:00

## 2023-09-04 MED ADMIN — valACYclovir (VALTREX) tablet 500 mg: 500 mg | ORAL

## 2023-09-04 MED ADMIN — guaiFENesin (ROBITUSSIN) oral syrup: 200 mg | ORAL | @ 04:00:00

## 2023-09-04 MED ADMIN — tamsulosin (FLOMAX) 24 hr capsule 0.4 mg: .4 mg | ORAL

## 2023-09-04 MED ADMIN — mycophenolate (CELLCEPT) tablet 1,000 mg: 1000 mg | ORAL

## 2023-09-04 MED ADMIN — loperamide (IMODIUM) capsule 4 mg: 4 mg | ORAL | @ 17:00:00

## 2023-09-04 MED ADMIN — lactated ringers bolus 1,000 mL: 1000 mL | INTRAVENOUS | @ 22:00:00 | Stop: 2023-09-04

## 2023-09-04 MED ADMIN — fluconazole (DIFLUCAN) tablet 400 mg: 400 mg | ORAL | @ 12:00:00 | Stop: 2023-09-21

## 2023-09-04 NOTE — Unmapped (Signed)
Bone Marrow Transplant and Cellular Therapy Program  Inpatient Progress Note    Patient Name: Timothy Porter  MRN: [id number]  Encounter Date: 09/04/23    [facility] Physician: Dr Vivien Presto Leukemia Physician: Dr. [name]  BMT Attending MD: 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  Type of Transplant: RIC MRD Allo  Graft Source: Fresh PBSCs  Transplant Day: 1559 (Autologous PBSC Unknown Phase - Planned 05/29/2019), 13 (Allogeneic PBSC Unknown Phase - Planned 08/22/2023)     Interval History:  - NAEON  - Continues to feel tired and weak  - HTN noted but given has significant orthostatic hypotension will hold off on restarting home BP meds PRN hydralazine available if needed  - Eating and drinking is poor, will support with IVF bolus  - Continues for have urgent loose BM 4 in the past 24 hours  - Remains on scheduled imodium  - LFTS improved today will recheck at normal interval  - Nausea controlled   - Starting to engraft, will sign orders for home IV mag in preparation for discharge  - Encouraged to work with PT/OT    Review of Systems:  A comprehensive ROS performed and is negative except for pertinent positives as listed above in interval history.     Objective:  Wt Readings from Last 1 Encounters:   09/03/23 (!) 121.3 kg (267 lb 6.7 oz)     Temp Readings from Last 1 Encounters:   09/04/23 36.9 ??C (98.4 ??F) (Oral)     BP Readings from Last 1 Encounters:   09/04/23 146/78     Pulse Readings from Last 1 Encounters:   09/04/23 102     SpO2 Readings from Last 1 Encounters:   09/04/23 94%     80, Normal activity with effort; some signs or symptoms of disease (ECOG equivalent 1)    Physical Exam:   General: no acute distress noted.   Central venous access: Line clean, dry, intact. No erythema or drainage noted.   ENT: Moist mucous membranes. Oropharhynx without lesions, erythema or exudate.   Cardiovascular: Pulse normal rate, regularity and rhythm. S1 and S2 normal, without any murmur, rub, or gallop.  Lungs: Clear to auscultation bilaterally, without wheezes/crackles/rhonchi. Good air movement.   Skin: Warm, dry, intact. No rash noted.    Psychiatry: Alert and oriented to person, place, and time.   Gastrointestinal/Abdomen: Normoactive bowel sounds, abdomen soft, non-tender   Musculoskeletal/Extremities: FROM throughout. No edema.  Neurologic: CNII-XII intact. Normal strength and sensation throughout    Test Results:   I reviewed all labs from today in Epic. See EMR for lab results.    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  RIC Flu/Bu Conditioning. Regimen Consists of:  1. Fludarabine 30 mg/m2 days -6, -5, -4, -3, -2  2. Busulfan 3.2 mg/kg days -5, -4    Donor: 8/8, ABO O+, CMV positive  Recipient A+ and CMV+  Engraftment: G-CSF starting Day + 5 through WBC recovery (as defined as ANC 1.0 x 2 days or 3.0 x 1 day)  -Date of last G-CSF injection: TBD    GVHD prophylaxis:   1. Cyclophosphamide 50 mg/kg IV daily on days +3 and +4 with mesna  2. Tacrolimus to goal trough (5-10 ng/mL) starting day +5  3. Mycophenolate mofetil 15 mg/kg  PO TID (max 1g PO TID) starting day +5 through day +35     Hem:   Transfusion criteria: Transfuse 1 unit of PRBCs for hemoglobin <7 and 1 unit of platelets for Plt <10K or bleeding. Timothy Porter. Timothy Porter does not have a history of transfusion reactions. Consent was obtained and documented on 08/15/23.    ID:   Prophylaxis:  -Antiviral: Valtrex 500 mg po, increased to BID on admit and continue through 2 years post transplant                   Letermovir 480 mg po to start at day +15 or hospital discharge and continue through day +200  -Antifungal: Fluconazole 400 mg po daily, continue through day +75  -Antibacterial: Levaquin 500 mg po daily until no longer neutropenic  -PJP: Bactrim DS once daily MWF upon platelet engraftment. GI:   GERD prophylaxis:  -Home med: Prilosec 40 mg daily. Will switch to pantoprazole while inpatient    Nausea:  -Anti-emetics per BMT protocol.     Hx of IBS with chronic alternating diarrhea/constipation  -Has managed with changing doses of Questran; increased to 1 packet BID in 02/2023 with improvement of diarrhea; self tapered to 0.5 packets BID  -Reports predominantly constipation concerns but would have occasional accidents; says he hasn't soiled himself in months with current dose  -Uses docusate only if no BM 3-4 days  -Meticulously self tracks and titrates medication  -Continue Questran dosing on admission with prn docusate; likely will need to hold if he develops diarrhea  -10/1: Changing Questran to PRN  -10/2: Cdiff negative    Diarrhea:  -10/2: Cdiff negative  -10/4: Imodium QID  -10/5: Added Lomotil q6 hours   - 10/6: Loose/watery stool x4 over last 24 hours. Continue scheduled Imodium/Lomotil, increased Imodium to 4mg  Q6    Renal: Creatinine normal. No issues.   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 SOS prophylaxis not indicated for RIC regimen.   --10/7 LFTs elevated recheck planned for tomorrow  10/8: recheck improved will monitor on regularly scheduled interval    CV:   -Hx of A-fib with transplant in 2020; required diltiazem+metoprolol; resolved post acute episode     * Continues on metoprolol mainly for BP.      * Plan for cardiac electrolytes    -HTN: has discontinued diltiazem prior to the start of venetoclax (for DDI) currently managed with metoprolol monotherapy; decreased to 50 mg/day ~1-2 months ago  -9/20: Adding norvasc 5mg  daily for persistent HTN  10/6 holding amlodipine  --Added PRN hydral    Pulm: No current issues.    ENT:  - Chronic cough when supine from PND? Try flonase or saline spray    GU:   Hx of BPH, previously on flomax will restart 10/2 (having some urinary urgency and incontinence due to mobility issues)   Hold sildenafil (for ED)    Endocrine:  Hx of steroid induced hyperglycemia not currently on medications    Neuro/Pain: No issues     Psych: CCSP consult prn.     Deconditioning:  - PT/OT consulted  - Uses a cane for walking.     Azalee Course Josafat Enrico, FNP  Bone Marrow Transplant  and Cellular Therapy Program

## 2023-09-05 LAB — CBC W/ AUTO DIFF
BASOPHILS ABSOLUTE COUNT: 0 10*9/L (ref 0.0–0.1)
BASOPHILS RELATIVE PERCENT: 0.1 %
EOSINOPHILS ABSOLUTE COUNT: 0 10*9/L (ref 0.0–0.5)
EOSINOPHILS RELATIVE PERCENT: 0.1 %
HEMATOCRIT: 21.7 % — ABNORMAL LOW (ref 39.0–48.0)
HEMOGLOBIN: 7.7 g/dL — ABNORMAL LOW (ref 12.9–16.5)
LYMPHOCYTES ABSOLUTE COUNT: 0 10*9/L — ABNORMAL LOW (ref 1.1–3.6)
LYMPHOCYTES RELATIVE PERCENT: 2.4 %
MEAN CORPUSCULAR HEMOGLOBIN CONC: 35.4 g/dL (ref 32.0–36.0)
MEAN CORPUSCULAR HEMOGLOBIN: 34.2 pg — ABNORMAL HIGH (ref 25.9–32.4)
MEAN CORPUSCULAR VOLUME: 96.7 fL — ABNORMAL HIGH (ref 77.6–95.7)
MEAN PLATELET VOLUME: 8.2 fL (ref 6.8–10.7)
MONOCYTES ABSOLUTE COUNT: 0.1 10*9/L — ABNORMAL LOW (ref 0.3–0.8)
MONOCYTES RELATIVE PERCENT: 19.3 %
NEUTROPHILS ABSOLUTE COUNT: 0.4 10*9/L — CL (ref 1.8–7.8)
NEUTROPHILS RELATIVE PERCENT: 78.1 %
PLATELET COUNT: 18 10*9/L — ABNORMAL LOW (ref 150–450)
RED BLOOD CELL COUNT: 2.24 10*12/L — ABNORMAL LOW (ref 4.26–5.60)
RED CELL DISTRIBUTION WIDTH: 13.2 % (ref 12.2–15.2)
WBC ADJUSTED: 0.5 10*9/L — ABNORMAL LOW (ref 3.6–11.2)

## 2023-09-05 LAB — SLIDE REVIEW

## 2023-09-05 LAB — BASIC METABOLIC PANEL
ANION GAP: 6 mmol/L (ref 5–14)
BLOOD UREA NITROGEN: 22 mg/dL (ref 9–23)
BUN / CREAT RATIO: 28
CALCIUM: 7.9 mg/dL — ABNORMAL LOW (ref 8.7–10.4)
CHLORIDE: 109 mmol/L — ABNORMAL HIGH (ref 98–107)
CO2: 26 mmol/L (ref 20.0–31.0)
CREATININE: 0.78 mg/dL
EGFR CKD-EPI (2021) MALE: >90 mL/min/{1.73_m2} (ref >=60)
GLUCOSE RANDOM: 123 mg/dL (ref 70–179)
POTASSIUM: 4 mmol/L (ref 3.4–4.8)
SODIUM: 141 mmol/L (ref 135–145)

## 2023-09-05 LAB — TACROLIMUS LEVEL, TROUGH: TACROLIMUS, TROUGH: 4.6 ng/mL — ABNORMAL LOW (ref 5.0–15.0)

## 2023-09-05 LAB — MAGNESIUM: MAGNESIUM: 1.8 mg/dL (ref 1.6–2.6)

## 2023-09-05 MED ORDER — TAMSULOSIN 0.4 MG CAPSULE
ORAL_CAPSULE | Freq: Every evening | ORAL | 1 refills | 30 days
Start: 2023-09-05 — End: ?

## 2023-09-05 MED ORDER — VALACYCLOVIR 500 MG TABLET
ORAL_TABLET | Freq: Two times a day (BID) | ORAL | 11 refills | 30 days
Start: 2023-09-05 — End: ?

## 2023-09-05 MED ADMIN — tacrolimus (PROGRAF) capsule 5 mg: 5 mg | ORAL | @ 12:00:00

## 2023-09-05 MED ADMIN — cefepime (MAXIPIME) 2 g in sodium chloride 0.9 % (NS) 100 mL IVPB-MBP: 2 g | INTRAVENOUS | @ 17:00:00 | Stop: 2023-09-11

## 2023-09-05 MED ADMIN — cholecalciferol (vitamin D3 25 mcg (1,000 units)) tablet 25 mcg: 25 ug | ORAL | @ 12:00:00

## 2023-09-05 MED ADMIN — filgrastim-ayow (RELEUKO) injection syringe 480 mcg: 480 ug | SUBCUTANEOUS

## 2023-09-05 MED ADMIN — loperamide (IMODIUM) capsule 4 mg: 4 mg | ORAL | @ 01:00:00

## 2023-09-05 MED ADMIN — loperamide (IMODIUM) capsule 4 mg: 4 mg | ORAL | @ 12:00:00

## 2023-09-05 MED ADMIN — tacrolimus (PROGRAF) capsule 5 mg: 5 mg | ORAL

## 2023-09-05 MED ADMIN — diphenoxylate-atropine (LOMOTIL) 2.5-0.025 mg per tablet 1 tablet: 1 | ORAL | @ 15:00:00

## 2023-09-05 MED ADMIN — acetaminophen (TYLENOL) tablet 650 mg: 650 mg | ORAL | @ 01:00:00

## 2023-09-05 MED ADMIN — cefepime (MAXIPIME) 2 g in sodium chloride 0.9 % (NS) 100 mL IVPB-MBP: 2 g | INTRAVENOUS | @ 01:00:00 | Stop: 2023-09-11

## 2023-09-05 MED ADMIN — valACYclovir (VALTREX) tablet 500 mg: 500 mg | ORAL | @ 01:00:00

## 2023-09-05 MED ADMIN — loperamide (IMODIUM) capsule 4 mg: 4 mg | ORAL | @ 20:00:00

## 2023-09-05 MED ADMIN — valACYclovir (VALTREX) tablet 500 mg: 500 mg | ORAL | @ 12:00:00

## 2023-09-05 MED ADMIN — diphenoxylate-atropine (LOMOTIL) 2.5-0.025 mg per tablet 1 tablet: 1 | ORAL

## 2023-09-05 MED ADMIN — loratadine (CLARITIN) tablet 10 mg: 10 mg | ORAL | @ 12:00:00

## 2023-09-05 MED ADMIN — fluconazole (DIFLUCAN) tablet 400 mg: 400 mg | ORAL | @ 12:00:00 | Stop: 2023-09-21

## 2023-09-05 MED ADMIN — heparin, porcine (PF) 100 unit/mL injection 2 mL: 2 mL | INTRAVENOUS | @ 03:00:00

## 2023-09-05 MED ADMIN — magnesium sulfate 2gm/50mL IVPB: 2 g | INTRAVENOUS | @ 07:00:00

## 2023-09-05 MED ADMIN — mycophenolate (CELLCEPT) tablet 1,000 mg: 1000 mg | ORAL | @ 12:00:00

## 2023-09-05 MED ADMIN — calcium carbonate (TUMS) chewable tablet 400 mg elem calcium: 400 mg | ORAL | @ 01:00:00

## 2023-09-05 MED ADMIN — cefepime (MAXIPIME) 2 g in sodium chloride 0.9 % (NS) 100 mL IVPB-MBP: 2 g | INTRAVENOUS | @ 09:00:00 | Stop: 2023-09-11

## 2023-09-05 MED ADMIN — calcium carbonate (TUMS) chewable tablet 400 mg elem calcium: 400 mg | ORAL | @ 09:00:00

## 2023-09-05 MED ADMIN — cyanocobalamin (vitamin B-12) tablet 2,000 mcg: 2000 ug | ORAL | @ 12:00:00

## 2023-09-05 MED ADMIN — mycophenolate (CELLCEPT) tablet 1,000 mg: 1000 mg | ORAL | @ 17:00:00

## 2023-09-05 MED ADMIN — mycophenolate (CELLCEPT) tablet 1,000 mg: 1000 mg | ORAL | @ 01:00:00

## 2023-09-05 MED ADMIN — metoPROLOL succinate (Toprol-XL) 24 hr tablet 50 mg: 50 mg | ORAL | @ 12:00:00

## 2023-09-05 MED ADMIN — diphenoxylate-atropine (LOMOTIL) 2.5-0.025 mg per tablet 1 tablet: 1 | ORAL | @ 09:00:00

## 2023-09-05 MED ADMIN — loperamide (IMODIUM) capsule 4 mg: 4 mg | ORAL | @ 15:00:00

## 2023-09-05 MED ADMIN — calcium carbonate (TUMS) chewable tablet 400 mg elem calcium: 400 mg | ORAL | @ 17:00:00

## 2023-09-05 MED ADMIN — pantoprazole (Protonix) EC tablet 40 mg: 40 mg | ORAL | @ 12:00:00

## 2023-09-05 MED ADMIN — tamsulosin (FLOMAX) 24 hr capsule 0.4 mg: .4 mg | ORAL | @ 01:00:00

## 2023-09-05 MED ADMIN — diphenoxylate-atropine (LOMOTIL) 2.5-0.025 mg per tablet 1 tablet: 1 | ORAL | @ 20:00:00

## 2023-09-05 NOTE — Unmapped (Signed)
Day +14 for allo for AML. Patient is A&Ox4, VSS, febrile Tmax 38.6. Cultures drawn, started on cefepime IV. Mag replaced for 1.8. On 2L [place] throughout the night, productive cough. Using primofit for urination. No complaints of pain or distress. Drinking ginger ale.    Problem: Adult Inpatient Plan of Care  Goal: Plan of Care Review  Outcome: Ongoing - Unchanged  Goal: Patient-Specific Goal (Individualized)  Outcome: Ongoing - Unchanged  Goal: Absence of Hospital-Acquired Illness or Injury  Outcome: Ongoing - Unchanged  Intervention: Prevent Skin Injury  Recent Flowsheet Documentation  Taken 09/04/2023 2030 by Quincy Sheehan, RN  Positioning for Skin: Supine/Back  Device Skin Pressure Protection:   absorbent pad utilized/changed   adhesive use limited  Skin Protection: adhesive use limited  Goal: Optimal Comfort and Wellbeing  Outcome: Ongoing - Unchanged  Goal: Readiness for Transition of Care  Outcome: Ongoing - Unchanged  Goal: Rounds/Family Conference  Outcome: Ongoing - Unchanged     Problem: Infection  Goal: Absence of Infection Signs and Symptoms  Outcome: Ongoing - Unchanged     Problem: Fall Injury Risk  Goal: Absence of Fall and Fall-Related Injury  Outcome: Ongoing - Unchanged     Problem: Fall Injury Risk  Goal: Absence of Fall and Fall-Related Injury  Outcome: Ongoing - Unchanged     Problem: Comorbidity Management  Goal: Blood Pressure in Desired Range  Outcome: Ongoing - Unchanged     Problem: Self-Care Deficit  Goal: Improved Ability to Complete Activities of Daily Living  Outcome: Ongoing - Unchanged     Problem: Skin Injury Risk Increased  Goal: Skin Health and Integrity  Outcome: Ongoing - Unchanged  Intervention: Optimize Skin Protection  Recent Flowsheet Documentation  Taken 09/04/2023 2030 by Quincy Sheehan, RN  Pressure Reduction Techniques: frequent weight shift encouraged  Pressure Reduction Devices: pressure-redistributing mattress utilized  Skin Protection: adhesive use limited

## 2023-09-05 NOTE — Unmapped (Signed)
Bone Marrow Transplant and Cellular Therapy Program  Inpatient Progress Note    Patient Name: Timothy Porter  MRN: [id number]  Encounter Date: 09/05/23    [facility] Myeloma Physician: Dr Vivien Presto Leukemia Physician: Dr. [name]  BMT Attending MD: 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 Blood cultures done, started on cefepime, cxr done, RPP negative  - Continues to feel tired and weak  - HTN noted but given has significant orthostatic hypotension will hold off on restarting home BP meds PRN hydralazine available if needed  - Eating and drinking improving  - BM improving only 1 in the past 24 hours  - Remains on scheduled imodium and lomotil  - Nausea controlled   - Starting to engraft, will sign orders for home IV mag in preparation for discharge  - Encouraged to work with PT/OT  - Weight change: -1.7 kg (-3 lb 12 oz)      Review of Systems:  A comprehensive ROS performed and is negative except for pertinent positives as listed above in interval history.     Objective:  Wt Readings from Last 1 Encounters:   09/04/23 (!) 119.6 kg (263 lb 10.7 oz)     Temp Readings from Last 1 Encounters:   09/05/23 36.9 ??C (98.5 ??F) (Oral)     BP Readings from Last 1 Encounters:   09/05/23 129/69     Pulse Readings from Last 1 Encounters:   09/05/23 95     SpO2 Readings from Last 1 Encounters:   09/05/23 96%     80, Normal activity with effort; some signs or symptoms of disease (ECOG equivalent 1)    Physical Exam:   General: no acute distress noted.   Central venous access: Line clean, dry, intact. No erythema or drainage noted.   ENT: Moist mucous membranes. Oropharhynx without lesions, erythema or exudate.   Cardiovascular: Pulse normal rate, regularity and rhythm. S1 and S2 normal, without any murmur, rub, or gallop.  Lungs: Clear to auscultation bilaterally, without wheezes/crackles/rhonchi. Good air movement.   Skin: Warm, dry, intact. No rash noted.    Psychiatry: Alert and oriented to person, place, and time.   Gastrointestinal/Abdomen: Normoactive bowel sounds, abdomen soft, non-tender   Musculoskeletal/Extremities: FROM throughout. No edema.  Neurologic: CNII-XII intact. Normal strength and sensation throughout    Test Results:   I reviewed all labs from today in Epic. See EMR for lab results.    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  RIC Flu/Bu Conditioning. Regimen Consists of:  1. Fludarabine 30 mg/m2 days -6, -5, -4, -3, -2  2. Busulfan 3.2 mg/kg days -5, -4    Donor: 8/8, ABO O+, CMV positive  Recipient A+ and CMV+  Engraftment: G-CSF starting Day + 5 through WBC recovery (as defined as ANC 1.0 x 2 days or 3.0 x 1 day)  -Date of last G-CSF injection: TBD    GVHD prophylaxis:   1. Cyclophosphamide 50 mg/kg IV daily on days +3 and +4 with mesna  2. Tacrolimus to goal  trough (5-10 ng/mL) starting day +5  3. Mycophenolate mofetil 15 mg/kg PO TID (max 1g PO TID) starting day +5 through day +35     Hem:   Transfusion criteria: Transfuse 1 unit of PRBCs for hemoglobin <7 and 1 unit of platelets for Plt <10K or bleeding. [name]. Kaas does not have a history of transfusion reactions. Consent was obtained and documented on 08/15/23.    ID:   Prophylaxis:  -Antiviral: Valtrex 500 mg po, increased to BID on admit and continue through 2 years post transplant                   Letermovir 480 mg po to start at day +15 or hospital discharge and continue through day +200  -Antifungal: Fluconazole 400 mg po daily, continue through day +75  -Antibacterial: Cefepime (10/8-)  -PJP: Bactrim DS once daily MWF upon platelet engraftment.     Febrile 10/8  --Blood cultures pending  - Cefepime     GI:   GERD prophylaxis:  -Home med: Prilosec 40 mg daily. Will switch to pantoprazole while inpatient    Nausea:  -Anti-emetics per BMT protocol.     Hx of IBS with chronic alternating diarrhea/constipation  -Has managed with changing doses of Questran; increased to 1 packet BID in 02/2023 with improvement of diarrhea; self tapered to 0.5 packets BID  -Reports predominantly constipation concerns but would have occasional accidents; says he hasn't soiled himself in months with current dose  -Uses docusate only if no BM 3-4 days  -Meticulously self tracks and titrates medication  -Continue Questran dosing on admission with prn docusate; likely will need to hold if he develops diarrhea  -10/1: Changing Questran to PRN  -10/2: Cdiff negative    Diarrhea:  -10/2: Cdiff negative  -10/4: Imodium QID  -10/5: Added Lomotil q6 hours   - 10/6: Loose/watery stool x4 over last 24 hours. Continue scheduled Imodium/Lomotil, increased Imodium to 4mg  Q6    Renal: Creatinine normal. No issues.   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 SOS prophylaxis not indicated for RIC regimen.   --10/7 LFTs elevated recheck planned for tomorrow  10/8: recheck improved will monitor on regularly scheduled interval    CV:   -Hx of A-fib with transplant in 2020; required diltiazem+metoprolol; resolved post acute episode     * Continues on metoprolol mainly for BP.      * Plan for cardiac electrolytes    -HTN: has discontinued diltiazem prior to the start of venetoclax (for DDI) currently managed with metoprolol monotherapy; decreased to 50 mg/day ~1-2 months ago  -9/20: Adding norvasc 5mg  daily for persistent HTN  10/6 holding amlodipine  --Added PRN hydral    Pulm:   10/8 new oxygen requirement  - CXR: shows no acute findings  - RPP negative      ENT:  - Chronic cough when supine from PND? Try flonase or saline spray    GU:   Hx of BPH, previously on flomax will restart 10/2 (having some urinary urgency and incontinence due to mobility issues)   Hold sildenafil (for ED)    Endocrine:  Hx of steroid induced hyperglycemia not currently on medications    Neuro/Pain:  No issues     Psych: CCSP consult prn.     Deconditioning:  - PT/OT consulted  - Uses a cane for walking.     Azalee Course Michail Boyte, FNP  [facility]

## 2023-09-05 NOTE — Unmapped (Signed)
Tacrolimus Therapeutic Monitoring Pharmacy Note    Timothy Porter is a 77 y.o. male w/ MM who is day +12 from alloHSCT w/ RIC Bu/Flu conditioning and PT-Cy/Tac/MMF GVHD prophylaxis. He is continuing tacrolimus.    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     Date of Transplant:  08/22/23       Prior Dosing Information:  See table below      Source(s) of information used to determine prior to admission dosing: MAR    Goals:  Therapeutic Drug Levels  Tacrolimus trough goal:  5-10 ng/mL    Additional Clinical Monitoring/Outcomes  Monitor renal function (SCr and urine output) and liver function (LFTs)  Monitor for signs/symptoms of adverse events (e.g., hyperglycemia, hyperkalemia, hypomagnesemia, hypertension, headache, tremor)    Results:   Tacrolimus level: 4.6 ng/mL, drawn appropriately    Creatinine   Date Value Ref Range Status   09/04/2023 0.78 0.73 - 1.18 mg/dL Final   36/64/4034 7.42 0.73 - 1.18 mg/dL Final   59/56/3875 6.43 (L) 0.73 - 1.18 mg/dL Final     Tacrolimus, Trough   Date Value Ref Range Status   09/05/2023 4.6 (L) 5.0 - 15.0 ng/mL Final   09/03/2023 3.6 (L) 5.0 - 15.0 ng/mL Final   09/02/2023 4.1 (L) 5.0 - 15.0 ng/mL Final      Pharmacokinetic Considerations and Significant Drug Interactions:  Concurrent hepatotoxic medications:  fluconazole  Concurrent CYP3A4 substrates/inhibitors:  fluconazole (letermovir to start day +15)  Concurrent nephrotoxic medications: None identified    Assessment/Plan:  Recommendation(s)  Tacrolimus trough remains subtherapeutic but not yet at steady state following dose increase on 10/7  Renal function WNL  LFTs and Tbili WNL mildly elevated but stable  He continues fluconazole, which was started on 9/25 and should be at steady state at this time. He will start letermovir on 10/10, so may see further level increases at that time via additional CYP3A4 inhibition  Given trough trending up following dose increase and letermovir starting tomorrow, will continue tacrolimus to 5 mg PO BID. Plan to recheck 10/11 to assess trend    Follow-up  Next level has been ordered on 09/07/23 at 0800  A pharmacist will continue to monitor and recommend levels as appropriate    Longitudinal Dose Monitoring:  Date Dose (mg), Route AM Scr (mg/dL) Level  (ng/mL) Key Drug Interactions   9/30 5.5/5.5 0.84  Fluconazole   10/1 5.5/5.5 0.72  Fluconazole   10/2 4/4 0.69 4.4 Fluconazole   10/3 4/4 0.73  Fluconazole   10/4 4/4 0.73 4.3 Fluconazole   10/5 4/4 0.72  Fluconazole   10/6 4/4 0.75  Fluconazole   10/7 4/5 0.72 3.6 Fluconazole   10/8 5/5 0.73  Fluconazole   10/9 5/5 0.78 4.6 Fluconazole     Please page service pharmacist with questions/clarifications.    Etheleen Sia, PharmD  PGY2 Oncology Pharmacy Resident

## 2023-09-05 NOTE — Unmapped (Signed)
[facility] provided an in person encounter to assess barriers of care and provide resource education.  OVN Name: Marlena Clipper  Visit Type: In-Person  Summary: Met w/pt, wife not there yet. He expects to be discharged in the next 3 days. Lives in [place] and will go directly home, no need for [employer]. Discussed importance of discharge plan and to  ensure all questions answered before leaving.

## 2023-09-05 NOTE — Unmapped (Signed)
A&O*4. Orthostatic hypotension & Hypoxic this shift. Provider aware. Patient is on Nasal canula with 2 lrs of O2. Patient caregiver present at the bedside. Compazine given once as a PRN for Nausea. Call bell with in reach.    Problem: Adult Inpatient Plan of Care  Goal: Absence of Hospital-Acquired Illness or Injury  Intervention: Identify and Manage Fall Risk  Recent Flowsheet Documentation  Taken 09/04/2023 1115 by Ralph Dowdy, RN  Safety Interventions:   bleeding precautions   commode/urinal/bedpan at bedside   fall reduction program maintained   infection management   isolation precautions   lighting adjusted for tasks/safety   low bed   neutropenic precautions   nonskid shoes/slippers when out of bed  Intervention: Prevent and Manage VTE (Venous Thromboembolism) Risk  Recent Flowsheet Documentation  Taken 09/04/2023 1114 by Ralph Dowdy, RN  VTE Prevention/Management:   fluids promoted   ambulation promoted  Intervention: Prevent Infection  Recent Flowsheet Documentation  Taken 09/04/2023 1115 by Ralph Dowdy, RN  Infection Prevention:   cohorting utilized   hand hygiene promoted   personal protective equipment utilized   rest/sleep promoted   single patient room provided   visitors restricted/screened   equipment surfaces disinfected   environmental surveillance performed     Problem: Infection  Goal: Absence of Infection Signs and Symptoms  Intervention: Prevent or Manage Infection  Recent Flowsheet Documentation  Taken 09/04/2023 1115 by Ralph Dowdy, RN  Infection Management: aseptic technique maintained  Isolation Precautions: protective precautions maintained     Problem: Fall Injury Risk  Goal: Absence of Fall and Fall-Related Injury  Intervention: Identify and Manage Contributors  Recent Flowsheet Documentation  Taken 09/04/2023 1114 by Ralph Dowdy, RN  Self-Care Promotion: independence encouraged  Intervention: Promote Injury-Free Environment  Recent Flowsheet Documentation  Taken 09/04/2023 1115 by Ralph Dowdy, RN  Safety Interventions:   bleeding precautions   commode/urinal/bedpan at bedside   fall reduction program maintained   infection management   isolation precautions   lighting adjusted for tasks/safety   low bed   neutropenic precautions   nonskid shoes/slippers when out of bed     Problem: Fall Injury Risk  Goal: Absence of Fall and Fall-Related Injury  Intervention: Identify and Manage Contributors  Recent Flowsheet Documentation  Taken 09/04/2023 1114 by Ralph Dowdy, RN  Self-Care Promotion: independence encouraged  Intervention: Promote Injury-Free Environment  Recent Flowsheet Documentation  Taken 09/04/2023 1115 by Ralph Dowdy, RN  Safety Interventions:   bleeding precautions   commode/urinal/bedpan at bedside   fall reduction program maintained   infection management   isolation precautions   lighting adjusted for tasks/safety   low bed   neutropenic precautions   nonskid shoes/slippers when out of bed     Problem: Self-Care Deficit  Goal: Improved Ability to Complete Activities of Daily Living  Intervention: Promote Activity and Functional Independence  Recent Flowsheet Documentation  Taken 09/04/2023 1114 by Ralph Dowdy, RN  Self-Care Promotion: independence encouraged

## 2023-09-06 LAB — CBC W/ AUTO DIFF
BASOPHILS ABSOLUTE COUNT: 0 10*9/L (ref 0.0–0.1)
BASOPHILS RELATIVE PERCENT: 0.1 %
EOSINOPHILS ABSOLUTE COUNT: 0 10*9/L (ref 0.0–0.5)
EOSINOPHILS RELATIVE PERCENT: 0 %
HEMATOCRIT: 20.9 % — ABNORMAL LOW (ref 39.0–48.0)
HEMOGLOBIN: 7.6 g/dL — ABNORMAL LOW (ref 12.9–16.5)
LYMPHOCYTES ABSOLUTE COUNT: 0 10*9/L — ABNORMAL LOW (ref 1.1–3.6)
LYMPHOCYTES RELATIVE PERCENT: 1.5 %
MEAN CORPUSCULAR HEMOGLOBIN CONC: 36.6 g/dL — ABNORMAL HIGH (ref 32.0–36.0)
MEAN CORPUSCULAR HEMOGLOBIN: 34.7 pg — ABNORMAL HIGH (ref 25.9–32.4)
MEAN CORPUSCULAR VOLUME: 94.9 fL (ref 77.6–95.7)
MEAN PLATELET VOLUME: 8.6 fL (ref 6.8–10.7)
MONOCYTES ABSOLUTE COUNT: 0.3 10*9/L (ref 0.3–0.8)
MONOCYTES RELATIVE PERCENT: 14.6 %
NEUTROPHILS ABSOLUTE COUNT: 2 10*9/L (ref 1.8–7.8)
NEUTROPHILS RELATIVE PERCENT: 83.8 %
PLATELET COUNT: 20 10*9/L — ABNORMAL LOW (ref 150–450)
RED BLOOD CELL COUNT: 2.2 10*12/L — ABNORMAL LOW (ref 4.26–5.60)
RED CELL DISTRIBUTION WIDTH: 13.3 % (ref 12.2–15.2)
WBC ADJUSTED: 2.4 10*9/L — ABNORMAL LOW (ref 3.6–11.2)

## 2023-09-06 LAB — BASIC METABOLIC PANEL
ANION GAP: 8 mmol/L (ref 5–14)
BLOOD UREA NITROGEN: 24 mg/dL — ABNORMAL HIGH (ref 9–23)
BUN / CREAT RATIO: 30
CALCIUM: 8.1 mg/dL — ABNORMAL LOW (ref 8.7–10.4)
CHLORIDE: 108 mmol/L — ABNORMAL HIGH (ref 98–107)
CO2: 25 mmol/L (ref 20.0–31.0)
CREATININE: 0.79 mg/dL
EGFR CKD-EPI (2021) MALE: >90 mL/min/{1.73_m2} (ref >=60)
GLUCOSE RANDOM: 110 mg/dL (ref 70–179)
POTASSIUM: 3.6 mmol/L (ref 3.4–4.8)
SODIUM: 141 mmol/L (ref 135–145)

## 2023-09-06 LAB — HEPATIC FUNCTION PANEL
ALBUMIN: 2.5 g/dL — ABNORMAL LOW (ref 3.4–5.0)
ALKALINE PHOSPHATASE: 125 U/L — ABNORMAL HIGH (ref 46–116)
ALT (SGPT): 52 U/L — ABNORMAL HIGH (ref 10–49)
AST (SGOT): 24 U/L (ref <=34)
BILIRUBIN DIRECT: 0.3 mg/dL (ref 0.00–0.30)
BILIRUBIN TOTAL: 0.6 mg/dL (ref 0.3–1.2)
PROTEIN TOTAL: 5.5 g/dL — ABNORMAL LOW (ref 5.7–8.2)

## 2023-09-06 LAB — IONIZED CALCIUM VENOUS: CALCIUM IONIZED VENOUS (MG/DL): 4.37 mg/dL — ABNORMAL LOW (ref 4.40–5.40)

## 2023-09-06 LAB — APTT
APTT: 26.6 s (ref 24.8–38.4)
HEPARIN CORRELATION: 0.2

## 2023-09-06 LAB — LACTATE DEHYDROGENASE: LACTATE DEHYDROGENASE: 160 U/L (ref 120–246)

## 2023-09-06 LAB — SLIDE REVIEW

## 2023-09-06 LAB — PROTIME-INR
INR: 1.21
PROTIME: 13.5 s — ABNORMAL HIGH (ref 9.9–12.6)

## 2023-09-06 LAB — PHOSPHORUS: PHOSPHORUS: 2.5 mg/dL (ref 2.4–5.1)

## 2023-09-06 LAB — MAGNESIUM: MAGNESIUM: 1.8 mg/dL (ref 1.6–2.6)

## 2023-09-06 MED ORDER — AMLODIPINE 5 MG TABLET
ORAL_TABLET | Freq: Every day | ORAL | 3 refills | 30 days
Start: 2023-09-06 — End: ?

## 2023-09-06 MED ORDER — VALACYCLOVIR 500 MG TABLET
ORAL_TABLET | Freq: Two times a day (BID) | ORAL | 11 refills | 30.00000 days | Status: CN
Start: 2023-09-06 — End: ?

## 2023-09-06 MED ORDER — TAMSULOSIN 0.4 MG CAPSULE
ORAL_CAPSULE | Freq: Every evening | ORAL | 0 refills | 90 days
Start: 2023-09-06 — End: ?

## 2023-09-06 MED ORDER — LOPERAMIDE 2 MG CAPSULE
ORAL_CAPSULE | ORAL | 0 refills | 4.00000 days | PRN
Start: 2023-09-06 — End: 2023-10-06

## 2023-09-06 MED ORDER — FLUCONAZOLE 200 MG TABLET
ORAL_TABLET | Freq: Every day | ORAL | 1 refills | 30 days
Start: 2023-09-06 — End: ?

## 2023-09-06 MED ADMIN — mycophenolate (CELLCEPT) tablet 1,000 mg: 1000 mg | ORAL

## 2023-09-06 MED ADMIN — loperamide (IMODIUM) capsule 4 mg: 4 mg | ORAL

## 2023-09-06 MED ADMIN — tacrolimus (PROGRAF) capsule 5 mg: 5 mg | ORAL | @ 12:00:00

## 2023-09-06 MED ADMIN — filgrastim-ayow (RELEUKO) injection syringe 480 mcg: 480 ug | SUBCUTANEOUS

## 2023-09-06 MED ADMIN — amlodipine (NORVASC) tablet 5 mg: 5 mg | ORAL | @ 14:00:00

## 2023-09-06 MED ADMIN — valACYclovir (VALTREX) tablet 500 mg: 500 mg | ORAL | @ 12:00:00

## 2023-09-06 MED ADMIN — cefepime (MAXIPIME) 2 g in sodium chloride 0.9 % (NS) 100 mL IVPB-MBP: 2 g | INTRAVENOUS | @ 01:00:00 | Stop: 2023-09-11

## 2023-09-06 MED ADMIN — letermovir (PREVYMIS) tablet 480 mg: 480 mg | ORAL | @ 12:00:00

## 2023-09-06 MED ADMIN — cyanocobalamin (vitamin B-12) tablet 2,000 mcg: 2000 ug | ORAL | @ 12:00:00

## 2023-09-06 MED ADMIN — potassium chloride 20 mEq in 100 mL IVPB Premix: 20 meq | INTRAVENOUS | @ 10:00:00 | Stop: 2024-08-15

## 2023-09-06 MED ADMIN — metoPROLOL succinate (Toprol-XL) 24 hr tablet 50 mg: 50 mg | ORAL | @ 12:00:00

## 2023-09-06 MED ADMIN — fluconazole (DIFLUCAN) tablet 400 mg: 400 mg | ORAL | @ 12:00:00 | Stop: 2023-09-21

## 2023-09-06 MED ADMIN — loratadine (CLARITIN) tablet 10 mg: 10 mg | ORAL | @ 12:00:00

## 2023-09-06 MED ADMIN — sodium chloride 0.9% (NS) bolus 1,000 mL: 1000 mL | INTRAVENOUS | @ 14:00:00 | Stop: 2023-09-06

## 2023-09-06 MED ADMIN — tamsulosin (FLOMAX) 24 hr capsule 0.4 mg: .4 mg | ORAL

## 2023-09-06 MED ADMIN — calcium carbonate (TUMS) chewable tablet 400 mg elem calcium: 400 mg | ORAL | @ 09:00:00

## 2023-09-06 MED ADMIN — mycophenolate (CELLCEPT) tablet 1,000 mg: 1000 mg | ORAL | @ 12:00:00

## 2023-09-06 MED ADMIN — valACYclovir (VALTREX) tablet 500 mg: 500 mg | ORAL

## 2023-09-06 MED ADMIN — pantoprazole (Protonix) EC tablet 40 mg: 40 mg | ORAL | @ 12:00:00

## 2023-09-06 MED ADMIN — magnesium sulfate 2gm/50mL IVPB: 2 g | INTRAVENOUS | @ 09:00:00

## 2023-09-06 MED ADMIN — potassium chloride 20 mEq in 100 mL IVPB Premix: 20 meq | INTRAVENOUS | @ 11:00:00 | Stop: 2024-08-15

## 2023-09-06 MED ADMIN — loperamide (IMODIUM) capsule 4 mg: 4 mg | ORAL | @ 14:00:00

## 2023-09-06 MED ADMIN — cholecalciferol (vitamin D3 25 mcg (1,000 units)) tablet 25 mcg: 25 ug | ORAL | @ 12:00:00

## 2023-09-06 MED ADMIN — calcium carbonate (TUMS) chewable tablet 400 mg elem calcium: 400 mg | ORAL

## 2023-09-06 MED ADMIN — cefepime (MAXIPIME) 2 g in sodium chloride 0.9 % (NS) 100 mL IVPB-MBP: 2 g | INTRAVENOUS | @ 09:00:00 | Stop: 2023-09-06

## 2023-09-06 MED ADMIN — calcium carbonate (TUMS) chewable tablet 400 mg elem calcium: 400 mg | ORAL | @ 19:00:00

## 2023-09-06 MED ADMIN — tacrolimus (PROGRAF) capsule 5 mg: 5 mg | ORAL

## 2023-09-06 MED ADMIN — mycophenolate (CELLCEPT) tablet 1,000 mg: 1000 mg | ORAL | @ 19:00:00

## 2023-09-06 NOTE — Unmapped (Signed)
Day +15 for allo for AML. Patient is A&Ox4, VSS, afebrile. No pain or distress. Patient on RA. No BM yesterday. Potassium and Mag replaced. Using primofit. Lines and claves changed.    Problem: Adult Inpatient Plan of Care  Goal: Plan of Care Review  Outcome: Ongoing - Unchanged  Goal: Patient-Specific Goal (Individualized)  Outcome: Ongoing - Unchanged  Goal: Absence of Hospital-Acquired Illness or Injury  Outcome: Ongoing - Unchanged  Intervention: Prevent Skin Injury  Recent Flowsheet Documentation  Taken 09/05/2023 2030 by Quincy Sheehan, RN  Positioning for Skin: Supine/Back  Device Skin Pressure Protection:   absorbent pad utilized/changed   adhesive use limited  Skin Protection: adhesive use limited  Goal: Optimal Comfort and Wellbeing  Outcome: Ongoing - Unchanged  Goal: Readiness for Transition of Care  Outcome: Ongoing - Unchanged  Goal: Rounds/Family Conference  Outcome: Ongoing - Unchanged     Problem: Infection  Goal: Absence of Infection Signs and Symptoms  Outcome: Ongoing - Unchanged     Problem: Fall Injury Risk  Goal: Absence of Fall and Fall-Related Injury  Outcome: Ongoing - Unchanged     Problem: Fall Injury Risk  Goal: Absence of Fall and Fall-Related Injury  Outcome: Ongoing - Unchanged     Problem: Comorbidity Management  Goal: Blood Pressure in Desired Range  Outcome: Ongoing - Unchanged     Problem: Self-Care Deficit  Goal: Improved Ability to Complete Activities of Daily Living  Outcome: Ongoing - Unchanged     Problem: Skin Injury Risk Increased  Goal: Skin Health and Integrity  Outcome: Ongoing - Unchanged  Intervention: Optimize Skin Protection  Recent Flowsheet Documentation  Taken 09/05/2023 2030 by Quincy Sheehan, RN  Pressure Reduction Techniques: frequent weight shift encouraged  Pressure Reduction Devices: pressure-redistributing mattress utilized  Skin Protection: adhesive use limited

## 2023-09-06 NOTE — Unmapped (Signed)
A&O*4. VSS. Afebrile. Caregiver at the bedside. No major complaints or concerns this shift. Neutropenic precautions maintained. Imodium given as ordered. No falls reported this shift. Call bell within reach.     Problem: Adult Inpatient Plan of Care  Goal: Absence of Hospital-Acquired Illness or Injury  Intervention: Identify and Manage Fall Risk  Recent Flowsheet Documentation  Taken 09/05/2023 0930 by Ralph Dowdy, RN  Safety Interventions:   commode/urinal/bedpan at bedside   bleeding precautions   fall reduction program maintained   family at bedside   infection management   isolation precautions   lighting adjusted for tasks/safety   low bed   neutropenic precautions   nonskid shoes/slippers when out of bed  Intervention: Prevent and Manage VTE (Venous Thromboembolism) Risk  Recent Flowsheet Documentation  Taken 09/05/2023 0830 by Ralph Dowdy, RN  VTE Prevention/Management:   fluids promoted   ambulation promoted  Intervention: Prevent Infection  Recent Flowsheet Documentation  Taken 09/05/2023 0930 by Ralph Dowdy, RN  Infection Prevention:   cohorting utilized   environmental surveillance performed   equipment surfaces disinfected   hand hygiene promoted   personal protective equipment utilized   rest/sleep promoted   single patient room provided   visitors restricted/screened     Problem: Infection  Goal: Absence of Infection Signs and Symptoms  Intervention: Prevent or Manage Infection  Recent Flowsheet Documentation  Taken 09/05/2023 0930 by Ralph Dowdy, RN  Infection Management: aseptic technique maintained  Isolation Precautions: protective precautions maintained     Problem: Fall Injury Risk  Goal: Absence of Fall and Fall-Related Injury  Intervention: Identify and Manage Contributors  Recent Flowsheet Documentation  Taken 09/05/2023 0945 by Ralph Dowdy, RN  Self-Care Promotion: independence encouraged  Intervention: Promote Injury-Free Environment  Recent Flowsheet Documentation  Taken 09/05/2023 0930 by Ralph Dowdy, RN  Safety Interventions:   commode/urinal/bedpan at bedside   bleeding precautions   fall reduction program maintained   family at bedside   infection management   isolation precautions   lighting adjusted for tasks/safety   low bed   neutropenic precautions   nonskid shoes/slippers when out of bed     Problem: Fall Injury Risk  Goal: Absence of Fall and Fall-Related Injury  Intervention: Identify and Manage Contributors  Recent Flowsheet Documentation  Taken 09/05/2023 0945 by Ralph Dowdy, RN  Self-Care Promotion: independence encouraged  Intervention: Promote Injury-Free Environment  Recent Flowsheet Documentation  Taken 09/05/2023 0930 by Ralph Dowdy, RN  Safety Interventions:   commode/urinal/bedpan at bedside   bleeding precautions   fall reduction program maintained   family at bedside   infection management   isolation precautions   lighting adjusted for tasks/safety   low bed   neutropenic precautions   nonskid shoes/slippers when out of bed     Problem: Self-Care Deficit  Goal: Improved Ability to Complete Activities of Daily Living  Intervention: Promote Activity and Functional Independence  Recent Flowsheet Documentation  Taken 09/05/2023 0945 by Ralph Dowdy, RN  Self-Care Promotion: independence encouraged     Problem: Skin Injury Risk Increased  Goal: Skin Health and Integrity  Intervention: Promote and Optimize Oral Intake  Recent Flowsheet Documentation  Taken 09/05/2023 0930 by Ralph Dowdy, RN  Oral Nutrition Promotion: physical activity promoted

## 2023-09-06 NOTE — Unmapped (Signed)
[facility] Program  Inpatient Progress Note    Patient Name: Timothy Porter  MRN: [id number]  Encounter Date: 09/06/23    [facility] Myeloma Physician: Dr Vivien Presto Leukemia Physician: Dr. [name]  BMT Attending MD: 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  Type of Transplant: RIC MRD Allo  Graft Source: Fresh PBSCs  Transplant Day: 1561 (Autologous PBSC Unknown Phase - Planned 05/29/2019), 15 (Allogeneic PBSC Unknown Phase - Planned 08/22/2023)     Interval History:  - NAEON  - Blood cultures no growth will DC cefepime  - Continue GCSF for one more day  - Continues to feel tired and weak, will see OT today  - Pending OT reqs could DC tomorrow  - HTN noted will restart home amlodipine  - Eating and drinking improving, intake decreased will bolus with 1 liter IVF  - No BM in the past 24 hours, will DC scheduled imodium and lomotil  - Nausea controlled   - Starting to engraft, will sign orders for home IV mag in preparation for discharge  - Encouraged to work with PT/OT  - Weight change: -3.1 kg (-6 lb 13.4 oz)      Review of Systems:  A comprehensive ROS performed and is negative except for pertinent positives as listed above in interval history.     Objective:  Wt Readings from Last 1 Encounters:   09/05/23 (!) 116.5 kg (256 lb 13.4 oz)     Temp Readings from Last 1 Encounters:   09/06/23 36.9 ??C (98.4 ??F) (Oral)     BP Readings from Last 1 Encounters:   09/06/23 161/86     Pulse Readings from Last 1 Encounters:   09/06/23 98     SpO2 Readings from Last 1 Encounters:   09/06/23 96%     80, Normal activity with effort; some signs or symptoms of disease (ECOG equivalent 1)    Physical Exam:   General: no acute distress noted.   Central venous access: Line clean, dry, intact. No erythema or drainage noted.   ENT: Moist mucous membranes. Oropharhynx without lesions, erythema or exudate.   Cardiovascular: Pulse normal rate, regularity and rhythm. S1 and S2 normal, without any murmur, rub, or gallop.  Lungs: Clear to auscultation bilaterally, without wheezes/crackles/rhonchi. Good air movement.   Skin: Warm, dry, intact. No rash noted.    Psychiatry: Alert and oriented to person, place, and time.   Gastrointestinal/Abdomen: Normoactive bowel sounds, abdomen soft, non-tender   Musculoskeletal/Extremities: FROM throughout. No edema.  Neurologic: CNII-XII intact. Normal strength and sensation throughout    Test Results:   I reviewed all labs from today in Epic. See EMR for lab results.    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  RIC Flu/Bu Conditioning. Regimen Consists of:  1. Fludarabine 30 mg/m2 days -6, -5, -4, -3, -2  2. Busulfan 3.2 mg/kg days -5, -4    Donor: 8/8, ABO O+, CMV positive  Recipient A+ and CMV+  Engraftment: G-CSF starting Day + 5 through WBC recovery (as defined as ANC 1.0 x 2 days or 3.0 x 1 day)  -Date of last G-CSF injection: TBD    GVHD prophylaxis:   1. Cyclophosphamide 50 mg/kg IV daily on days +3 and +4 with mesna  2. Tacrolimus to  goal trough (5-10 ng/mL) starting day +5  3. Mycophenolate mofetil 15 mg/kg PO TID (max 1g PO TID) starting day +5 through day +35     Hem:   Transfusion criteria: Transfuse 1 unit of PRBCs for hemoglobin <7 and 1 unit of platelets for Plt <10K or bleeding. [name]. Papandrea does not have a history of transfusion reactions. Consent was obtained and documented on 08/15/23.    ID:   Prophylaxis:  -Antiviral: Valtrex 500 mg po, increased to BID on admit and continue through 2 years post transplant                   Letermovir 480 mg po to start at day +15 and continue through day +200  -Antifungal: Fluconazole 400 mg po daily, continue through day +75  -Antibacterial: -PJP: Bactrim DS once daily MWF upon platelet engraftment.     Febrile 10/8  --Blood cultures NGTD  - Cefepime (10/8-10/10)    GI:   GERD prophylaxis:  -Home med: Prilosec 40 mg daily. Will switch to pantoprazole while inpatient    Nausea:  -Anti-emetics per BMT protocol.     Hx of IBS with chronic alternating diarrhea/constipation  -Has managed with changing doses of Questran; increased to 1 packet BID in 02/2023 with improvement of diarrhea; self tapered to 0.5 packets BID  -Reports predominantly constipation concerns but would have occasional accidents; says he hasn't soiled himself in months with current dose  -Uses docusate only if no BM 3-4 days  -Meticulously self tracks and titrates medication  -Continue Questran dosing on admission with prn docusate; likely will need to hold if he develops diarrhea  -10/1: Changing Questran to PRN  -10/2: Cdiff negative  - 10/10 Lomotil and Imodium to PRN    Diarrhea:  -10/2: Cdiff negative  -10/4: Imodium QID  -10/5: Added Lomotil q6 hours   - 10/6: Loose/watery stool x4 over last 24 hours. Continue scheduled Imodium/Lomotil, increased Imodium to 4mg  Q6  - 10/10 Lomotil and Imodium to PRN      Renal: Creatinine normal. No issues.   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 SOS prophylaxis not indicated for RIC regimen.   --10/7 LFTs elevated recheck planned for tomorrow  10/8: recheck improved will monitor on regularly scheduled interval    CV:   -Hx of A-fib with transplant in 2020; required diltiazem+metoprolol; resolved post acute episode     * Continues on metoprolol mainly for BP.      * Plan for cardiac electrolytes    -HTN: has discontinued diltiazem prior to the start of venetoclax (for DDI) currently managed with metoprolol monotherapy; decreased to 50 mg/day ~1-2 months ago  -9/20: Adding norvasc 5mg  daily for persistent HTN  10/6 holding amlodipine (Restarted 10/10)  --Added PRN hydral    Pulm:   10/8 new oxygen requirement (10/9 off oxygen)  - CXR: shows no acute findings  - RPP negative    ENT:  - Chronic cough when supine from PND? Try flonase or saline spray    GU:   Hx of BPH, previously on flomax will restart 10/2 (having some urinary urgency and incontinence due to mobility issues)   Hold sildenafil (for  ED)    Endocrine:  Hx of steroid induced hyperglycemia not currently on medications    Neuro/Pain: No issues     Psych: CCSP consult prn.     Deconditioning:  - PT/OT consulted  - Uses a cane for walking.     Azalee Course Jahan Friedlander, FNP  [facility]

## 2023-09-07 LAB — CBC W/ AUTO DIFF
BASOPHILS ABSOLUTE COUNT: 0 10*9/L (ref 0.0–0.1)
BASOPHILS RELATIVE PERCENT: 0.1 %
EOSINOPHILS ABSOLUTE COUNT: 0 10*9/L (ref 0.0–0.5)
EOSINOPHILS RELATIVE PERCENT: 0 %
HEMATOCRIT: 21 % — ABNORMAL LOW (ref 39.0–48.0)
HEMOGLOBIN: 7.7 g/dL — ABNORMAL LOW (ref 12.9–16.5)
LYMPHOCYTES ABSOLUTE COUNT: 0.1 10*9/L — ABNORMAL LOW (ref 1.1–3.6)
LYMPHOCYTES RELATIVE PERCENT: 1.2 %
MEAN CORPUSCULAR HEMOGLOBIN CONC: 36.4 g/dL — ABNORMAL HIGH (ref 32.0–36.0)
MEAN CORPUSCULAR HEMOGLOBIN: 34.8 pg — ABNORMAL HIGH (ref 25.9–32.4)
MEAN CORPUSCULAR VOLUME: 95.5 fL (ref 77.6–95.7)
MEAN PLATELET VOLUME: 8.9 fL (ref 6.8–10.7)
MONOCYTES ABSOLUTE COUNT: 0.9 10*9/L — ABNORMAL HIGH (ref 0.3–0.8)
MONOCYTES RELATIVE PERCENT: 13.6 %
NEUTROPHILS ABSOLUTE COUNT: 5.4 10*9/L (ref 1.8–7.8)
NEUTROPHILS RELATIVE PERCENT: 85.1 %
PLATELET COUNT: 28 10*9/L — ABNORMAL LOW (ref 150–450)
RED BLOOD CELL COUNT: 2.2 10*12/L — ABNORMAL LOW (ref 4.26–5.60)
RED CELL DISTRIBUTION WIDTH: 13.4 % (ref 12.2–15.2)
WBC ADJUSTED: 6.3 10*9/L (ref 3.6–11.2)

## 2023-09-07 LAB — BASIC METABOLIC PANEL
ANION GAP: 6 mmol/L (ref 5–14)
BLOOD UREA NITROGEN: 15 mg/dL (ref 9–23)
BUN / CREAT RATIO: 21
CALCIUM: 7.8 mg/dL — ABNORMAL LOW (ref 8.7–10.4)
CHLORIDE: 108 mmol/L — ABNORMAL HIGH (ref 98–107)
CO2: 26 mmol/L (ref 20.0–31.0)
CREATININE: 0.7 mg/dL — ABNORMAL LOW
EGFR CKD-EPI (2021) MALE: >90 mL/min/{1.73_m2} (ref >=60)
GLUCOSE RANDOM: 107 mg/dL (ref 70–179)
POTASSIUM: 3.5 mmol/L (ref 3.4–4.8)
SODIUM: 140 mmol/L (ref 135–145)

## 2023-09-07 LAB — MAGNESIUM: MAGNESIUM: 1.6 mg/dL (ref 1.6–2.6)

## 2023-09-07 LAB — SLIDE REVIEW

## 2023-09-07 LAB — TACROLIMUS LEVEL, TROUGH: TACROLIMUS, TROUGH: 6 ng/mL (ref 5.0–15.0)

## 2023-09-07 MED ORDER — VALACYCLOVIR 500 MG TABLET
ORAL_TABLET | Freq: Two times a day (BID) | ORAL | 11 refills | 30 days
Start: 2023-09-07 — End: ?

## 2023-09-07 MED ORDER — LOPERAMIDE 2 MG CAPSULE
ORAL_CAPSULE | Freq: Four times a day (QID) | ORAL | 0 refills | 4 days | PRN
Start: 2023-09-07 — End: ?

## 2023-09-07 MED ORDER — POTASSIUM CHLORIDE ER 20 MEQ TABLET,EXTENDED RELEASE(PART/CRYST)
ORAL_TABLET | Freq: Every day | ORAL | 0 refills | 20 days
Start: 2023-09-07 — End: ?

## 2023-09-07 MED ORDER — TACROLIMUS 1 MG CAPSULE, IMMEDIATE-RELEASE
ORAL_CAPSULE | Freq: Two times a day (BID) | ORAL | 5 refills | 30 days
Start: 2023-09-07 — End: ?

## 2023-09-07 MED ORDER — TAMSULOSIN 0.4 MG CAPSULE
ORAL_CAPSULE | Freq: Every evening | ORAL | 1 refills | 30 days
Start: 2023-09-07 — End: ?

## 2023-09-07 MED ORDER — MYCOPHENOLATE MOFETIL 500 MG TABLET
ORAL_TABLET | Freq: Three times a day (TID) | ORAL | 0 refills | 20 days
Start: 2023-09-07 — End: ?

## 2023-09-07 MED ORDER — AMLODIPINE 5 MG TABLET
ORAL_TABLET | Freq: Every day | ORAL | 1 refills | 30.00000 days
Start: 2023-09-07 — End: 2024-09-06

## 2023-09-07 MED ORDER — FLUCONAZOLE 200 MG TABLET
ORAL_TABLET | Freq: Every day | ORAL | 1 refills | 30 days
Start: 2023-09-07 — End: ?

## 2023-09-07 MED ADMIN — tacrolimus (PROGRAF) capsule 5 mg: 5 mg | ORAL

## 2023-09-07 MED ADMIN — letermovir (PREVYMIS) tablet 480 mg: 480 mg | ORAL | @ 13:00:00

## 2023-09-07 MED ADMIN — calcium carbonate (TUMS) chewable tablet 400 mg elem calcium: 400 mg | ORAL | @ 09:00:00

## 2023-09-07 MED ADMIN — valACYclovir (VALTREX) tablet 500 mg: 500 mg | ORAL | @ 13:00:00

## 2023-09-07 MED ADMIN — potassium chloride ER tablet 20 mEq: 20 meq | ORAL | @ 14:00:00

## 2023-09-07 MED ADMIN — potassium chloride 20 mEq in 100 mL IVPB Premix: 20 meq | INTRAVENOUS | @ 09:00:00 | Stop: 2024-08-15

## 2023-09-07 MED ADMIN — filgrastim-ayow (RELEUKO) injection syringe 480 mcg: 480 ug | SUBCUTANEOUS

## 2023-09-07 MED ADMIN — magnesium sulfate 2gm/50mL IVPB: 2 g | INTRAVENOUS | @ 07:00:00

## 2023-09-07 MED ADMIN — metoPROLOL succinate (Toprol-XL) 24 hr tablet 50 mg: 50 mg | ORAL | @ 13:00:00

## 2023-09-07 MED ADMIN — potassium chloride 20 mEq in 100 mL IVPB Premix: 20 meq | INTRAVENOUS | @ 07:00:00 | Stop: 2024-08-15

## 2023-09-07 MED ADMIN — mycophenolate (CELLCEPT) tablet 1,000 mg: 1000 mg | ORAL

## 2023-09-07 MED ADMIN — magnesium sulfate 2gm/50mL IVPB: 2 g | INTRAVENOUS | @ 09:00:00

## 2023-09-07 MED ADMIN — calcium carbonate (TUMS) chewable tablet 400 mg elem calcium: 400 mg | ORAL

## 2023-09-07 MED ADMIN — mycophenolate (CELLCEPT) tablet 1,000 mg: 1000 mg | ORAL | @ 18:00:00

## 2023-09-07 MED ADMIN — mycophenolate (CELLCEPT) tablet 1,000 mg: 1000 mg | ORAL | @ 13:00:00

## 2023-09-07 MED ADMIN — tacrolimus (PROGRAF) capsule 5 mg: 5 mg | ORAL | @ 13:00:00

## 2023-09-07 MED ADMIN — tamsulosin (FLOMAX) 24 hr capsule 0.4 mg: .4 mg | ORAL

## 2023-09-07 MED ADMIN — heparin, porcine (PF) 100 unit/mL injection 2 mL: 2 mL | INTRAVENOUS | @ 03:00:00

## 2023-09-07 MED ADMIN — calcium carbonate (TUMS) chewable tablet 400 mg elem calcium: 400 mg | ORAL | @ 18:00:00

## 2023-09-07 MED ADMIN — loperamide (IMODIUM) capsule 4 mg: 4 mg | ORAL | @ 14:00:00

## 2023-09-07 MED ADMIN — potassium chloride 20 mEq in 100 mL IVPB Premix: 20 meq | INTRAVENOUS | @ 08:00:00 | Stop: 2024-08-15

## 2023-09-07 MED ADMIN — cyanocobalamin (vitamin B-12) tablet 2,000 mcg: 2000 ug | ORAL | @ 13:00:00

## 2023-09-07 MED ADMIN — fluconazole (DIFLUCAN) tablet 400 mg: 400 mg | ORAL | @ 13:00:00 | Stop: 2023-09-21

## 2023-09-07 MED ADMIN — sodium chloride 0.9% (NS) bolus 1,000 mL: 1000 mL | INTRAVENOUS | @ 13:00:00 | Stop: 2023-09-07

## 2023-09-07 MED ADMIN — amlodipine (NORVASC) tablet 5 mg: 5 mg | ORAL | @ 13:00:00

## 2023-09-07 MED ADMIN — loratadine (CLARITIN) tablet 10 mg: 10 mg | ORAL | @ 13:00:00

## 2023-09-07 MED ADMIN — cholecalciferol (vitamin D3 25 mcg (1,000 units)) tablet 25 mcg: 25 ug | ORAL | @ 13:00:00

## 2023-09-07 MED ADMIN — pantoprazole (Protonix) EC tablet 40 mg: 40 mg | ORAL | @ 13:00:00

## 2023-09-07 MED ADMIN — valACYclovir (VALTREX) tablet 500 mg: 500 mg | ORAL

## 2023-09-07 NOTE — Unmapped (Signed)
Tacrolimus Therapeutic Monitoring Pharmacy Note    Timothy Porter is a 77 y.o. male w/ MM who is day +16 from alloHSCT w/ RIC Bu/Flu conditioning and PT-Cy/Tac/MMF GVHD prophylaxis. He is continuing tacrolimus.    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     Date of Transplant:  08/22/23       Prior Dosing Information:  See table below      Source(s) of information used to determine prior to admission dosing: MAR    Goals:  Therapeutic Drug Levels  Tacrolimus trough goal:  5-10 ng/mL    Additional Clinical Monitoring/Outcomes  Monitor renal function (SCr and urine output) and liver function (LFTs)  Monitor for signs/symptoms of adverse events (e.g., hyperglycemia, hyperkalemia, hypomagnesemia, hypertension, headache, tremor)    Results:   Tacrolimus level: 6.0 ng/mL, drawn appropriately    Creatinine   Date Value Ref Range Status   09/06/2023 0.70 (L) 0.73 - 1.18 mg/dL Final   16/08/9603 5.40 0.73 - 1.18 mg/dL Final   98/09/9146 8.29 0.73 - 1.18 mg/dL Final     Tacrolimus, Trough   Date Value Ref Range Status   09/07/2023 6.0 5.0 - 15.0 ng/mL Final   09/05/2023 4.6 (L) 5.0 - 15.0 ng/mL Final   09/03/2023 3.6 (L) 5.0 - 15.0 ng/mL Final      Pharmacokinetic Considerations and Significant Drug Interactions:  Concurrent hepatotoxic medications:  fluconazole  Concurrent CYP3A4 substrates/inhibitors:  fluconazole, letermovir  Concurrent nephrotoxic medications: None identified    Assessment/Plan:  Recommendation(s)  Tacrolimus trough is therapeutic following dose increase on 10/7, with current dose likely nearing state  Renal function WNL  Tbili WNL and LFTs downtrending (AST WNL, ALT 52, Alk Phos 125)  He continues fluconazole, which was started on 9/25 and should be at steady state at this time. He recently started letermovir on 10/10, so may expect to see further increase in levels via additional moderate CYP3A4 inhibition   Given the above, plan to continue tacrolimus 5 mg PO BID and monitor closely while letermovir reaches steady state    Follow-up  Next level has been ordered on 09/09/23 at 0800  A pharmacist will continue to monitor and recommend levels as appropriate    Longitudinal Dose Monitoring:  Date Dose (mg), Route AM Scr (mg/dL) Level  (ng/mL) Key Drug Interactions   9/30 5.5/5.5 0.84  Fluconazole   10/1 5.5/5.5 0.72  Fluconazole   10/2 4/4 0.69 4.4 Fluconazole   10/3 4/4 0.73  Fluconazole   10/4 4/4 0.73 4.3 Fluconazole   10/5 4/4 0.72  Fluconazole   10/6 4/4 0.75  Fluconazole   10/7 4/5 0.72 3.6 Fluconazole   10/8 5/5 0.73  Fluconazole   10/9 5/5 0.78 4.6 Fluconazole   10/10 5/5 0.79  Fluconazole, letermovir   10/11 5/5 0.70 6.0 Fluconazole, letermovir     Please page service pharmacist with questions/clarifications.    Hale Bogus, PharmD  Inpatient Pharmacist, [facility] Cellular Therapy

## 2023-09-07 NOTE — Unmapped (Signed)
Vss, no falls this shift. Call bell within reach. Assist x1-2 OOB. 2 loose BMs this shift. Family at bedside.   Problem: Adult Inpatient Plan of Care  Goal: Plan of Care Review  Outcome: Ongoing - Unchanged  Goal: Patient-Specific Goal (Individualized)  Outcome: Ongoing - Unchanged  Goal: Absence of Hospital-Acquired Illness or Injury  Outcome: Ongoing - Unchanged  Intervention: Identify and Manage Fall Risk  Recent Flowsheet Documentation  Taken 09/06/2023 0700 by Wanda Plump, RN  Safety Interventions:   bleeding precautions   chemotherapeutic agent precautions   low bed   isolation precautions   neutropenic precautions   nonskid shoes/slippers when out of bed   muscle strengthening facilitated  Intervention: Prevent Infection  Recent Flowsheet Documentation  Taken 09/06/2023 0700 by Wanda Plump, RN  Infection Prevention:   cohorting utilized   environmental surveillance performed   equipment surfaces disinfected   hand hygiene promoted   personal protective equipment utilized   rest/sleep promoted   single patient room provided   visitors restricted/screened  Goal: Optimal Comfort and Wellbeing  Outcome: Ongoing - Unchanged  Goal: Readiness for Transition of Care  Outcome: Ongoing - Unchanged  Goal: Rounds/Family Conference  Outcome: Ongoing - Unchanged     Problem: Infection  Goal: Absence of Infection Signs and Symptoms  Outcome: Ongoing - Unchanged  Intervention: Prevent or Manage Infection  Recent Flowsheet Documentation  Taken 09/06/2023 0700 by Wanda Plump, RN  Infection Management: aseptic technique maintained  Isolation Precautions: protective precautions maintained     Problem: Fall Injury Risk  Goal: Absence of Fall and Fall-Related Injury  Outcome: Ongoing - Unchanged  Intervention: Promote Injury-Free Environment  Recent Flowsheet Documentation  Taken 09/06/2023 0700 by Wanda Plump, RN  Safety Interventions:   bleeding precautions   chemotherapeutic agent precautions   low bed   isolation precautions   neutropenic precautions   nonskid shoes/slippers when out of bed   muscle strengthening facilitated     Problem: Fall Injury Risk  Goal: Absence of Fall and Fall-Related Injury  Outcome: Ongoing - Unchanged  Intervention: Promote Scientist, clinical (histocompatibility and immunogenetics) Documentation  Taken 09/06/2023 0700 by Wanda Plump, RN  Safety Interventions:   bleeding precautions   chemotherapeutic agent precautions   low bed   isolation precautions   neutropenic precautions   nonskid shoes/slippers when out of bed   muscle strengthening facilitated     Problem: Comorbidity Management  Goal: Blood Pressure in Desired Range  Outcome: Ongoing - Unchanged     Problem: Self-Care Deficit  Goal: Improved Ability to Complete Activities of Daily Living  Outcome: Ongoing - Unchanged     Problem: Skin Injury Risk Increased  Goal: Skin Health and Integrity  Outcome: Ongoing - Unchanged     Problem: Malnutrition  Goal: Improved Nutritional Intake  Outcome: Ongoing - Unchanged

## 2023-09-07 NOTE — Unmapped (Signed)
[facility]one [facility] Transplant and Cellular Therapy Program  Inpatient Progress Note    Patient Name: Timothy Porter  MRN: [id number]  Encounter Date: 09/07/23    [facility] Myeloma Physician: Dr Vivien Presto Leukemia Physician: Dr. [name]  BMT Attending MD: 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 Will discontinue GCSF today  - Continue with PT/OT today and also referring outpatient PT/OT in prepareing for discharge  - Received IN Mg and K overnight and adding 20 of K po daily  - HTN noted, restart home amlodipine yesterday and BP 140/70 this morning  - With decreased oral intake 1.2L and  bolus with 1 liter IVF  - DC'd scheduled imodium and lomotil yesterday, Currently with imodium PRN for diarrhea  - Nausea controlled   - Afebrile and VSS  - Signed HH infusion, Requested follow up appointment next week Monday, Wednesday and Friday in preparing for discharge over the weekend      Review of Systems:  A comprehensive ROS performed and is negative except for pertinent positives as listed above in interval history.     Objective:  Wt Readings from Last 1 Encounters:   09/05/23 (!) 116.5 kg (256 lb 13.4 oz)     Temp Readings from Last 1 Encounters:   09/07/23 37 ??C (98.6 ??F) (Oral)     BP Readings from Last 1 Encounters:   09/07/23 141/72     Pulse Readings from Last 1 Encounters:   09/07/23 97     SpO2 Readings from Last 1 Encounters:   09/07/23 95%     80, Normal activity with effort; some signs or symptoms of disease (ECOG equivalent 1)    Physical Exam:   General: no acute distress noted.   Central venous access: Line clean, dry, intact. No erythema or drainage noted.   ENT: Moist mucous membranes. Oropharhynx without lesions, erythema or exudate.   Cardiovascular: Pulse normal rate, regularity and rhythm. S1 and S2 normal, without any murmur, rub, or gallop.  Lungs: Clear to auscultation bilaterally, without wheezes/crackles/rhonchi. Good air movement.   Skin: Warm, dry, intact. No rash noted.    Psychiatry: Alert and oriented to person, place, and time.   Gastrointestinal/Abdomen: Normoactive bowel sounds, abdomen soft, non-tender   Musculoskeletal/Extremities: FROM throughout. No edema.  Neurologic: CNII-XII intact. Normal strength and sensation throughout    Test Results:   I reviewed all labs from today in Epic. See EMR for lab results.    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  RIC Flu/Bu Conditioning. Regimen Consists of:  1. Fludarabine 30 mg/m2 days -6, -5, -4, -3, -2  2. Busulfan 3.2 mg/kg days -5, -4    Donor: 8/8, ABO O+, CMV positive  Recipient A+ and CMV+  Engraftment: G-CSF starting Day + 5 through WBC recovery (as defined as ANC 1.0 x 2 days or 3.0 x 1 day)  -Date of last G-CSF injection: TBD    GVHD prophylaxis:   1. Cyclophosphamide 50 mg/kg IV daily on  days +3 and +4 with mesna  2. Tacrolimus to goal trough (5-10 ng/mL) starting day +5  3. Mycophenolate mofetil 15 mg/kg PO TID (max 1g PO TID) starting day +5 through day +35     Hem:   Transfusion criteria: Transfuse 1 unit of PRBCs for hemoglobin <7 and 1 unit of platelets for Plt <10K or bleeding. Timothy Porter. Timothy Porter does not have a history of transfusion reactions. Consent was obtained and documented on 08/15/23.    ID:   Prophylaxis:  -Antiviral: Valtrex 500 mg po, increased to BID on admit and continue through 2 years post transplant                   Letermovir 480 mg po to start at day +15 and continue through day +200  -Antifungal: Fluconazole 400 mg po daily, continue through day +75  -Antibacterial: -PJP: Bactrim DS once daily MWF upon platelet engraftment.     Febrile 10/8  --Blood cultures NGTD  - Cefepime (10/8-10/10)    GI:   GERD prophylaxis:  -Home med: Prilosec 40 mg daily. Will switch to pantoprazole while inpatient    Nausea:  -Anti-emetics per BMT protocol.     Hx of IBS with chronic alternating diarrhea/constipation  -Has managed with changing doses of Questran; increased to 1 packet BID in 02/2023 with improvement of diarrhea; self tapered to 0.5 packets BID  -Reports predominantly constipation concerns but would have occasional accidents; says he hasn't soiled himself in months with current dose  -Uses docusate only if no BM 3-4 days  -Meticulously self tracks and titrates medication  -Continue Questran dosing on admission with prn docusate; likely will need to hold if he develops diarrhea  -10/1: Changing Questran to PRN  -10/2: Cdiff negative  - 10/10 Lomotil and Imodium to PRN    Diarrhea:  -10/2: Cdiff negative  -10/4: Imodium QID  -10/5: Added Lomotil q6 hours   - 10/6: Loose/watery stool x4 over last 24 hours. Continue scheduled Imodium/Lomotil, increased Imodium to 4mg  Q6  - 10/10 Lomotil and Imodium to PRN      Renal: Creatinine normal. No issues.   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 SOS prophylaxis not indicated for RIC regimen.   --10/7 LFTs elevated recheck planned for tomorrow  10/8: recheck improved will monitor on regularly scheduled interval    CV:   -Hx of A-fib with transplant in 2020; required diltiazem+metoprolol; resolved post acute episode     * Continues on metoprolol mainly for BP.      * Plan for cardiac electrolytes    -HTN: has discontinued diltiazem prior to the start of venetoclax (for DDI) currently managed with metoprolol monotherapy; decreased to 50 mg/day ~1-2 months ago  -9/20: Adding norvasc 5mg  daily for persistent HTN  10/6 holding amlodipine (Restarted 10/10)  --Added PRN hydral    Pulm:   10/8 new oxygen requirement (10/9 off oxygen)  - CXR: shows no acute findings  - RPP negative    ENT:  - Chronic cough when supine from PND? Try flonase or saline spray    GU:   Hx of BPH, previously on flomax  will restart 10/2 (having some urinary urgency and incontinence due to mobility issues)   Hold sildenafil (for ED)    Endocrine:  Hx of steroid induced hyperglycemia not currently on medications    Neuro/Pain: No issues     Psych: CCSP consult prn.     Deconditioning:  - PT/OT consulted  - Uses a cane for walking.     Tia Masker  [facility]

## 2023-09-07 NOTE — Unmapped (Signed)
VSS and afebrile. Pt denies any diarrhea, nausea or vomiting. Replacement IV Mag and K given. Pt denies any other needs or concerns.     Problem: Adult Inpatient Plan of Care  Goal: Plan of Care Review  09/07/2023 0614 by Nancy Marus, RN  Outcome: Ongoing - Unchanged  09/07/2023 0614 by Nancy Marus, RN  Outcome: Ongoing - Unchanged  Goal: Patient-Specific Goal (Individualized)  09/07/2023 0614 by Nancy Marus, RN  Outcome: Ongoing - Unchanged  09/07/2023 0614 by Nancy Marus, RN  Outcome: Ongoing - Unchanged  Goal: Absence of Hospital-Acquired Illness or Injury  09/07/2023 0614 by Nancy Marus, RN  Outcome: Ongoing - Unchanged  09/07/2023 0614 by Nancy Marus, RN  Outcome: Ongoing - Unchanged  Intervention: Identify and Manage Fall Risk  Recent Flowsheet Documentation  Taken 09/06/2023 2021 by Nancy Marus, RN  Safety Interventions:   low bed   lighting adjusted for tasks/safety   commode/urinal/bedpan at bedside   chemotherapeutic agent precautions   nonskid shoes/slippers when out of bed  Goal: Optimal Comfort and Wellbeing  09/07/2023 0614 by Nancy Marus, RN  Outcome: Ongoing - Unchanged  09/07/2023 0614 by Nancy Marus, RN  Outcome: Ongoing - Unchanged  Goal: Readiness for Transition of Care  09/07/2023 0614 by Nancy Marus, RN  Outcome: Ongoing - Unchanged  09/07/2023 0614 by Nancy Marus, RN  Outcome: Ongoing - Unchanged  Goal: Rounds/Family Conference  09/07/2023 0614 by Nancy Marus, RN  Outcome: Ongoing - Unchanged  09/07/2023 0614 by Nancy Marus, RN  Outcome: Ongoing - Unchanged     Problem: Fall Injury Risk  Goal: Absence of Fall and Fall-Related Injury  09/07/2023 0614 by Nancy Marus, RN  Outcome: Ongoing - Unchanged  09/07/2023 0614 by Nancy Marus, RN  Outcome: Ongoing - Unchanged  Intervention: Promote Injury-Free Environment  Recent Flowsheet Documentation  Taken 09/06/2023 2021 by Nancy Marus, RN  Safety Interventions:   low bed   lighting adjusted for tasks/safety   commode/urinal/bedpan at bedside   chemotherapeutic agent precautions   nonskid shoes/slippers when out of bed

## 2023-09-08 LAB — CBC W/ AUTO DIFF
BASOPHILS ABSOLUTE COUNT: 0 10*9/L (ref 0.0–0.1)
BASOPHILS RELATIVE PERCENT: 0.2 %
EOSINOPHILS ABSOLUTE COUNT: 0 10*9/L (ref 0.0–0.5)
EOSINOPHILS RELATIVE PERCENT: 0 %
HEMATOCRIT: 21.2 % — ABNORMAL LOW (ref 39.0–48.0)
HEMOGLOBIN: 7.8 g/dL — ABNORMAL LOW (ref 12.9–16.5)
LYMPHOCYTES ABSOLUTE COUNT: 0.1 10*9/L — ABNORMAL LOW (ref 1.1–3.6)
LYMPHOCYTES RELATIVE PERCENT: 0.9 %
MEAN CORPUSCULAR HEMOGLOBIN CONC: 36.7 g/dL — ABNORMAL HIGH (ref 32.0–36.0)
MEAN CORPUSCULAR HEMOGLOBIN: 35 pg — ABNORMAL HIGH (ref 25.9–32.4)
MEAN CORPUSCULAR VOLUME: 95.4 fL (ref 77.6–95.7)
MEAN PLATELET VOLUME: 9.9 fL (ref 6.8–10.7)
MONOCYTES ABSOLUTE COUNT: 1.7 10*9/L — ABNORMAL HIGH (ref 0.3–0.8)
MONOCYTES RELATIVE PERCENT: 15.6 %
NEUTROPHILS ABSOLUTE COUNT: 8.9 10*9/L — ABNORMAL HIGH (ref 1.8–7.8)
NEUTROPHILS RELATIVE PERCENT: 83.3 %
PLATELET COUNT: 38 10*9/L — ABNORMAL LOW (ref 150–450)
RED BLOOD CELL COUNT: 2.23 10*12/L — ABNORMAL LOW (ref 4.26–5.60)
RED CELL DISTRIBUTION WIDTH: 13.6 % (ref 12.2–15.2)
WBC ADJUSTED: 10.7 10*9/L (ref 3.6–11.2)

## 2023-09-08 LAB — BASIC METABOLIC PANEL
ANION GAP: 4 mmol/L — ABNORMAL LOW (ref 5–14)
BLOOD UREA NITROGEN: 16 mg/dL (ref 9–23)
BUN / CREAT RATIO: 23
CALCIUM: 7.7 mg/dL — ABNORMAL LOW (ref 8.7–10.4)
CHLORIDE: 110 mmol/L — ABNORMAL HIGH (ref 98–107)
CO2: 26 mmol/L (ref 20.0–31.0)
CREATININE: 0.71 mg/dL — ABNORMAL LOW
EGFR CKD-EPI (2021) MALE: >90 mL/min/{1.73_m2} (ref >=60)
GLUCOSE RANDOM: 104 mg/dL (ref 70–179)
POTASSIUM: 3.7 mmol/L (ref 3.4–4.8)
SODIUM: 140 mmol/L (ref 135–145)

## 2023-09-08 LAB — MAGNESIUM: MAGNESIUM: 1.5 mg/dL — ABNORMAL LOW (ref 1.6–2.6)

## 2023-09-08 MED ORDER — LOPERAMIDE 2 MG CAPSULE
ORAL_CAPSULE | Freq: Four times a day (QID) | ORAL | 0 refills | 4 days | PRN
Start: 2023-09-08 — End: ?

## 2023-09-08 MED ORDER — POTASSIUM CHLORIDE ER 20 MEQ TABLET,EXTENDED RELEASE(PART/CRYST)
ORAL_TABLET | Freq: Every day | ORAL | 0 refills | 20 days
Start: 2023-09-08 — End: ?

## 2023-09-08 MED ORDER — TACROLIMUS 1 MG CAPSULE, IMMEDIATE-RELEASE
ORAL_CAPSULE | Freq: Two times a day (BID) | ORAL | 5 refills | 30 days
Start: 2023-09-08 — End: ?

## 2023-09-08 MED ORDER — VALACYCLOVIR 500 MG TABLET
ORAL_TABLET | Freq: Two times a day (BID) | ORAL | 11 refills | 30 days
Start: 2023-09-08 — End: ?

## 2023-09-08 MED ORDER — TAMSULOSIN 0.4 MG CAPSULE
ORAL_CAPSULE | Freq: Every evening | ORAL | 1 refills | 30 days
Start: 2023-09-08 — End: ?

## 2023-09-08 MED ORDER — FLUCONAZOLE 200 MG TABLET
ORAL_TABLET | Freq: Every day | ORAL | 1 refills | 30 days
Start: 2023-09-08 — End: ?

## 2023-09-08 MED ORDER — MYCOPHENOLATE MOFETIL 500 MG TABLET
ORAL_TABLET | Freq: Three times a day (TID) | ORAL | 0 refills | 20 days
Start: 2023-09-08 — End: ?

## 2023-09-08 MED ADMIN — metoPROLOL succinate (Toprol-XL) 24 hr tablet 50 mg: 50 mg | ORAL | @ 13:00:00

## 2023-09-08 MED ADMIN — magnesium sulfate 2gm/50mL IVPB: 2 g | INTRAVENOUS | @ 11:00:00

## 2023-09-08 MED ADMIN — mycophenolate (CELLCEPT) tablet 1,000 mg: 1000 mg | ORAL | @ 18:00:00

## 2023-09-08 MED ADMIN — hydrALAZINE (APRESOLINE) injection 10 mg: 10 mg | INTRAVENOUS | @ 04:00:00

## 2023-09-08 MED ADMIN — loratadine (CLARITIN) tablet 10 mg: 10 mg | ORAL | @ 13:00:00

## 2023-09-08 MED ADMIN — calcium carbonate (TUMS) chewable tablet 400 mg elem calcium: 400 mg | ORAL | @ 01:00:00

## 2023-09-08 MED ADMIN — cholecalciferol (vitamin D3 25 mcg (1,000 units)) tablet 25 mcg: 25 ug | ORAL | @ 13:00:00

## 2023-09-08 MED ADMIN — cyanocobalamin (vitamin B-12) tablet 2,000 mcg: 2000 ug | ORAL | @ 13:00:00

## 2023-09-08 MED ADMIN — letermovir (PREVYMIS) tablet 480 mg: 480 mg | ORAL | @ 13:00:00

## 2023-09-08 MED ADMIN — valACYclovir (VALTREX) tablet 500 mg: 500 mg | ORAL | @ 13:00:00

## 2023-09-08 MED ADMIN — magnesium sulfate 2gm/50mL IVPB: 2 g | INTRAVENOUS | @ 08:00:00

## 2023-09-08 MED ADMIN — fluconazole (DIFLUCAN) tablet 400 mg: 400 mg | ORAL | @ 13:00:00 | Stop: 2023-09-21

## 2023-09-08 MED ADMIN — calcium carbonate (TUMS) chewable tablet 400 mg elem calcium: 400 mg | ORAL | @ 18:00:00

## 2023-09-08 MED ADMIN — mycophenolate (CELLCEPT) tablet 1,000 mg: 1000 mg | ORAL | @ 13:00:00

## 2023-09-08 MED ADMIN — tacrolimus (PROGRAF) capsule 5 mg: 5 mg | ORAL | @ 01:00:00

## 2023-09-08 MED ADMIN — tamsulosin (FLOMAX) 24 hr capsule 0.4 mg: .4 mg | ORAL | @ 01:00:00

## 2023-09-08 MED ADMIN — tacrolimus (PROGRAF) capsule 5 mg: 5 mg | ORAL | @ 13:00:00

## 2023-09-08 MED ADMIN — potassium chloride 20 mEq in 100 mL IVPB Premix: 20 meq | INTRAVENOUS | @ 10:00:00 | Stop: 2024-08-15

## 2023-09-08 MED ADMIN — calcium carbonate (TUMS) chewable tablet 400 mg elem calcium: 400 mg | ORAL | @ 09:00:00

## 2023-09-08 MED ADMIN — potassium chloride 20 mEq in 100 mL IVPB Premix: 20 meq | INTRAVENOUS | @ 08:00:00 | Stop: 2024-08-15

## 2023-09-08 MED ADMIN — mycophenolate (CELLCEPT) tablet 1,000 mg: 1000 mg | ORAL | @ 01:00:00

## 2023-09-08 MED ADMIN — potassium chloride ER tablet 20 mEq: 20 meq | ORAL | @ 13:00:00

## 2023-09-08 MED ADMIN — valACYclovir (VALTREX) tablet 500 mg: 500 mg | ORAL | @ 01:00:00

## 2023-09-08 MED ADMIN — amlodipine (NORVASC) tablet 5 mg: 5 mg | ORAL | @ 13:00:00 | Stop: 2023-09-08

## 2023-09-08 MED ADMIN — pantoprazole (Protonix) EC tablet 40 mg: 40 mg | ORAL | @ 13:00:00

## 2023-09-08 MED ADMIN — loperamide (IMODIUM) capsule 2 mg: 2 mg | ORAL | @ 10:00:00

## 2023-09-08 NOTE — Unmapped (Signed)
Bone Marrow Transplant and Cellular Therapy Program  Inpatient Progress Note    Patient Name: Timothy Porter  MRN: [id number]  Encounter Date: 09/08/23    [facility] Myeloma Physician: Dr Vivien Presto Leukemia Physician: Dr. [name]  BMT Attending MD: 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 Discontinued GCSF yesterday  - Continue with PT/OT today and also referring outpatient PT/OT in prepareing for discharge and signed home Rollator (The beneficiary has a mobility limitation that significantly impairs his/her ability to participate in one or more mobility related  activities of daily living in the home. A mobility limitation provides the beneficiary from accomplishing MRADL entirely. The beneficiary is able to safely use the walker)   - Received IV Mg and K overnight and adding 20 of K po daily  - HTN noted, restart home amlodipine on 10/10. Also noted received one dose of Hydralazine for BP 172/87 at [place] and BP 150's/70's this morning. Increased amlodipine to 10 mg from 5mg  daily  - With decreased oral intake 1.2L and  bolus with 1 liter IVF  - Repeated C-diff (-) overnight. Currently with imodium PRN for diarrhea  - Nausea controlled   - Afebrile and VSS  - Signed HH infusion, Requested follow up appointment next week Monday, Wednesday and Friday in preparing for poss discharge over the weekend      Review of Systems:  A comprehensive ROS performed and is negative except for pertinent positives as listed above in interval history.     Objective:  Wt Readings from Last 1 Encounters:   09/07/23 (!) 120.5 kg (265 lb 10.5 oz)     Temp Readings from Last 1 Encounters: 09/08/23 36.8 ??C (98.2 ??F) (Oral)     BP Readings from Last 1 Encounters:   09/08/23 161/82     Pulse Readings from Last 1 Encounters:   09/08/23 98     SpO2 Readings from Last 1 Encounters:   09/08/23 96%     80, Normal activity with effort; some signs or symptoms of disease (ECOG equivalent 1)    Physical Exam:   General: no acute distress noted.   Central venous access: Line clean, dry, intact. No erythema or drainage noted.   ENT: Moist mucous membranes. Oropharhynx without lesions, erythema or exudate.   Cardiovascular: Pulse normal rate, regularity and rhythm. S1 and S2 normal, without any murmur, rub, or gallop.  Lungs: Clear to auscultation bilaterally, without wheezes/crackles/rhonchi. Good air movement.   Skin: Warm, dry, intact. No rash noted.    Psychiatry: Alert and oriented to person, place, and time.   Gastrointestinal/Abdomen: Normoactive bowel sounds, abdomen soft, non-tender   Musculoskeletal/Extremities: FROM throughout. No edema.  Neurologic: CNII-XII intact. Normal strength and sensation throughout    Test Results:   I reviewed all labs from today in Epic. See EMR for lab results.    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  RIC Flu/Bu Conditioning. Regimen Consists of:  1. Fludarabine 30 mg/m2 days -6, -5, -4, -3, -2  2. Busulfan 3.2 mg/kg days -5, -4    Donor: 8/8, ABO O+, CMV positive  Recipient A+ and CMV+  Engraftment: G-CSF starting Day + 5 through WBC recovery (as defined as ANC 1.0 x 2 days or 3.0 x 1 day)  -Date of last G-CSF injection: TBD    GVHD prophylaxis:   1. Cyclophosphamide 50 mg/kg IV daily on days +3 and +4 with mesna  2. Tacrolimus to goal trough (5-10 ng/mL) starting day +5  3. Mycophenolate mofetil 15 mg/kg PO TID (max 1g PO TID) starting day +5 through day +35     Hem:   Transfusion criteria: Transfuse 1 unit of PRBCs for hemoglobin <7 and 1 unit of platelets for Plt <10K or bleeding. [name]. Chirco does not have a history of transfusion reactions. Consent was obtained and documented on 08/15/23.    ID:   Prophylaxis:  -Antiviral: Valtrex 500 mg po, increased to BID on admit and continue through 2 years post transplant                   Letermovir 480 mg po to start at day +15 and continue through day +200  -Antifungal: Fluconazole 400 mg po daily, continue through day +75  -Antibacterial: -PJP: Bactrim DS once daily MWF upon platelet engraftment.     Febrile 10/8  --Blood cultures NGTD  - Cefepime (10/8-10/10)    GI:   GERD prophylaxis:  -Home med: Prilosec 40 mg daily. Will switch to pantoprazole while inpatient    Nausea:  -Anti-emetics per BMT protocol.     Hx of IBS with chronic alternating diarrhea/constipation  -Has managed with changing doses of Questran; increased to 1 packet BID in 02/2023 with improvement of diarrhea; self tapered to 0.5 packets BID  -Reports predominantly constipation concerns but would have occasional accidents; says he hasn't soiled himself in months with current dose  -Uses docusate only if no BM 3-4 days  -Meticulously self tracks and titrates medication  -Continue Questran dosing on admission with prn docusate; likely will need to hold if he develops diarrhea  -10/1: Changing Questran to PRN  -10/2: Cdiff negative  - 10/10 Lomotil and Imodium to PRN    Diarrhea:  -10/2: Cdiff negative  -10/4: Imodium QID  -10/5: Added Lomotil q6 hours   - 10/6: Loose/watery stool x4 over last 24 hours. Continue scheduled Imodium/Lomotil, increased Imodium to 4mg  Q6  - 10/10 Lomotil and Imodium to PRN      Renal: Creatinine normal. No issues.   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 SOS prophylaxis not indicated for RIC regimen.   --10/7 LFTs elevated recheck planned for tomorrow  10/8: recheck improved will monitor on regularly scheduled interval    CV:   -Hx of A-fib with transplant in 2020; required diltiazem+metoprolol; resolved post acute episode     * Continues on metoprolol mainly for BP.      * Plan for cardiac electrolytes    -HTN: has discontinued diltiazem prior to the start of venetoclax (for DDI) currently managed with metoprolol monotherapy; decreased to 50 mg/day ~1-2 months ago  -9/20: Adding norvasc 5mg  daily for  persistent HTN  10/6 holding amlodipine (Restarted 10/10)  --Added PRN hydral  -10/12: Received one dose of Hydralazine for BP 172/87 and increased Amlodipine to 10mg  daily     Pulm:   10/8 new oxygen requirement (10/9 off oxygen)  - CXR: shows no acute findings  - RPP negative    ENT:  - Chronic cough when supine from PND? Try flonase or saline spray    GU:   Hx of BPH, previously on flomax will restart 10/2 (having some urinary urgency and incontinence due to mobility issues)   Hold sildenafil (for ED)    Endocrine:  Hx of steroid induced hyperglycemia not currently on medications    Neuro/Pain: No issues     Psych: CCSP consult prn.     Deconditioning:  - PT/OT consulted  - Uses a cane for walking.     Tia Masker  [facility] Trans[facility] Cellular Therapy Program

## 2023-09-08 NOTE — Unmapped (Signed)
Patient AOx4, VSS, afebrile. No falls or acute events this shift. Plan of Care reviewed with patient.  Safety measures in place of bed low with brakes locked, non-skid footwear on while out of bed, call bell within reach.        Problem: Adult Inpatient Plan of Care  Goal: Plan of Care Review  Outcome: Ongoing - Unchanged  Goal: Patient-Specific Goal (Individualized)  Outcome: Ongoing - Unchanged  Goal: Absence of Hospital-Acquired Illness or Injury  Outcome: Ongoing - Unchanged  Intervention: Identify and Manage Fall Risk  Recent Flowsheet Documentation  Taken 09/07/2023 0724 by Arizona Constable, RN  Safety Interventions:   bleeding precautions   commode/urinal/bedpan at bedside   environmental modification   fall reduction program maintained   infection management   isolation precautions   lighting adjusted for tasks/safety   low bed   neutropenic precautions   nonskid shoes/slippers when out of bed  Intervention: Prevent Skin Injury  Recent Flowsheet Documentation  Taken 09/07/2023 0845 by Arizona Constable, RN  Positioning for Skin: Supine/Back  Taken 09/07/2023 0724 by Arizona Constable, RN  Positioning for Skin: Supine/Back  Intervention: Prevent Infection  Recent Flowsheet Documentation  Taken 09/07/2023 0724 by Arizona Constable, RN  Infection Prevention: cohorting utilized  Goal: Optimal Comfort and Wellbeing  Outcome: Ongoing - Unchanged  Goal: Readiness for Transition of Care  Outcome: Ongoing - Unchanged  Goal: Rounds/Family Conference  Outcome: Ongoing - Unchanged     Problem: Infection  Goal: Absence of Infection Signs and Symptoms  Outcome: Ongoing - Unchanged  Intervention: Prevent or Manage Infection  Recent Flowsheet Documentation  Taken 09/07/2023 0724 by Arizona Constable, RN  Infection Management: aseptic technique maintained  Isolation Precautions: protective precautions maintained     Problem: Fall Injury Risk  Goal: Absence of Fall and Fall-Related Injury  Outcome: Ongoing - Unchanged  Intervention: Promote Injury-Free Environment  Recent Flowsheet Documentation  Taken 09/07/2023 0724 by Arizona Constable, RN  Safety Interventions:   bleeding precautions   commode/urinal/bedpan at bedside   environmental modification   fall reduction program maintained   infection management   isolation precautions   lighting adjusted for tasks/safety   low bed   neutropenic precautions   nonskid shoes/slippers when out of bed     Problem: Fall Injury Risk  Goal: Absence of Fall and Fall-Related Injury  Outcome: Ongoing - Unchanged  Intervention: Promote Injury-Free Environment  Recent Flowsheet Documentation  Taken 09/07/2023 0724 by Arizona Constable, RN  Safety Interventions:   bleeding precautions   commode/urinal/bedpan at bedside   environmental modification   fall reduction program maintained   infection management   isolation precautions   lighting adjusted for tasks/safety   low bed   neutropenic precautions   nonskid shoes/slippers when out of bed     Problem: Comorbidity Management  Goal: Blood Pressure in Desired Range  Outcome: Ongoing - Unchanged     Problem: Self-Care Deficit  Goal: Improved Ability to Complete Activities of Daily Living  Outcome: Ongoing - Unchanged     Problem: Skin Injury Risk Increased  Goal: Skin Health and Integrity  Outcome: Ongoing - Unchanged  Intervention: Optimize Skin Protection  Recent Flowsheet Documentation  Taken 09/07/2023 0845 by Arizona Constable, RN  Pressure Reduction Techniques: frequent weight shift encouraged  Taken 09/07/2023 0724 by Arizona Constable, RN  Activity Management: up ad lib  Pressure Reduction Techniques: frequent weight shift encouraged  Head of Bed (HOB) Positioning: HOB at 20-30  degrees     Problem: Malnutrition  Goal: Improved Nutritional Intake  Outcome: Ongoing - Unchanged

## 2023-09-08 NOTE — Unmapped (Addendum)
Pt day +17 from allo SCT.   Denies pain/n/v.  BP 17/87 - PRN hydralazine given and recheck was 128/66; provider aware.   3.7 K and 1.5 mag levels -replacing per orders.  Enteric precautions started; sent sample. PRN imodium given 1x per provider.   Call bell in reach.     Problem: Adult Inpatient Plan of Care  Goal: Plan of Care Review  Outcome: Progressing  Goal: Patient-Specific Goal (Individualized)  Outcome: Progressing  Goal: Absence of Hospital-Acquired Illness or Injury  Outcome: Progressing  Intervention: Identify and Manage Fall Risk  Recent Flowsheet Documentation  Taken 09/07/2023 1910 by Franne Forts, RN  Safety Interventions:   bleeding precautions   lighting adjusted for tasks/safety   low bed   isolation precautions   infection management   fall reduction program maintained   neutropenic precautions   nonskid shoes/slippers when out of bed   commode/urinal/bedpan at bedside  Intervention: Prevent Infection  Recent Flowsheet Documentation  Taken 09/07/2023 1910 by Franne Forts, RN  Infection Prevention:   cohorting utilized   environmental surveillance performed   equipment surfaces disinfected   hand hygiene promoted   personal protective equipment utilized   single patient room provided   rest/sleep promoted  Goal: Optimal Comfort and Wellbeing  Outcome: Progressing  Goal: Readiness for Transition of Care  Outcome: Progressing  Goal: Rounds/Family Conference  Outcome: Progressing     Problem: Infection  Goal: Absence of Infection Signs and Symptoms  Outcome: Progressing  Intervention: Prevent or Manage Infection  Recent Flowsheet Documentation  Taken 09/07/2023 2230 by Franne Forts, RN  Isolation Precautions: enteric precautions initiated  Taken 09/07/2023 1910 by Franne Forts, RN  Infection Management: aseptic technique maintained  Isolation Precautions: protective precautions maintained     Problem: Fall Injury Risk  Goal: Absence of Fall and Fall-Related Injury  Outcome: Progressing  Intervention: Promote Injury-Free Environment  Recent Flowsheet Documentation  Taken 09/07/2023 1910 by Franne Forts, RN  Safety Interventions:   bleeding precautions   lighting adjusted for tasks/safety   low bed   isolation precautions   infection management   fall reduction program maintained   neutropenic precautions   nonskid shoes/slippers when out of bed   commode/urinal/bedpan at bedside     Problem: Fall Injury Risk  Goal: Absence of Fall and Fall-Related Injury  Outcome: Progressing  Intervention: Promote Injury-Free Environment  Recent Flowsheet Documentation  Taken 09/07/2023 1910 by Franne Forts, RN  Safety Interventions:   bleeding precautions   lighting adjusted for tasks/safety   low bed   isolation precautions   infection management   fall reduction program maintained   neutropenic precautions   nonskid shoes/slippers when out of bed   commode/urinal/bedpan at bedside     Problem: Comorbidity Management  Goal: Blood Pressure in Desired Range  Outcome: Progressing     Problem: Self-Care Deficit  Goal: Improved Ability to Complete Activities of Daily Living  Outcome: Progressing     Problem: Skin Injury Risk Increased  Goal: Skin Health and Integrity  Outcome: Progressing     Problem: Malnutrition  Goal: Improved Nutritional Intake  Outcome: Progressing

## 2023-09-09 LAB — CBC W/ AUTO DIFF
BASOPHILS ABSOLUTE COUNT: 0 10*9/L (ref 0.0–0.1)
BASOPHILS RELATIVE PERCENT: 0.2 %
EOSINOPHILS ABSOLUTE COUNT: 0 10*9/L (ref 0.0–0.5)
EOSINOPHILS RELATIVE PERCENT: 0 %
HEMATOCRIT: 22.1 % — ABNORMAL LOW (ref 39.0–48.0)
HEMOGLOBIN: 8 g/dL — ABNORMAL LOW (ref 12.9–16.5)
LYMPHOCYTES ABSOLUTE COUNT: 0.1 10*9/L — ABNORMAL LOW (ref 1.1–3.6)
LYMPHOCYTES RELATIVE PERCENT: 1.6 %
MEAN CORPUSCULAR HEMOGLOBIN CONC: 36.1 g/dL — ABNORMAL HIGH (ref 32.0–36.0)
MEAN CORPUSCULAR HEMOGLOBIN: 34.4 pg — ABNORMAL HIGH (ref 25.9–32.4)
MEAN CORPUSCULAR VOLUME: 95.3 fL (ref 77.6–95.7)
MEAN PLATELET VOLUME: 8.7 fL (ref 6.8–10.7)
MONOCYTES ABSOLUTE COUNT: 1.6 10*9/L — ABNORMAL HIGH (ref 0.3–0.8)
MONOCYTES RELATIVE PERCENT: 21.3 %
NEUTROPHILS ABSOLUTE COUNT: 5.9 10*9/L (ref 1.8–7.8)
NEUTROPHILS RELATIVE PERCENT: 76.9 %
PLATELET COUNT: 41 10*9/L — ABNORMAL LOW (ref 150–450)
RED BLOOD CELL COUNT: 2.32 10*12/L — ABNORMAL LOW (ref 4.26–5.60)
RED CELL DISTRIBUTION WIDTH: 13.9 % (ref 12.2–15.2)
WBC ADJUSTED: 7.7 10*9/L (ref 3.6–11.2)

## 2023-09-09 LAB — SLIDE REVIEW

## 2023-09-09 LAB — BASIC METABOLIC PANEL
ANION GAP: 7 mmol/L (ref 5–14)
BLOOD UREA NITROGEN: 16 mg/dL (ref 9–23)
BUN / CREAT RATIO: 22
CALCIUM: 8.2 mg/dL — ABNORMAL LOW (ref 8.7–10.4)
CHLORIDE: 107 mmol/L (ref 98–107)
CO2: 26 mmol/L (ref 20.0–31.0)
CREATININE: 0.73 mg/dL
EGFR CKD-EPI (2021) MALE: >90 mL/min/{1.73_m2} (ref >=60)
GLUCOSE RANDOM: 114 mg/dL (ref 70–179)
POTASSIUM: 3.6 mmol/L (ref 3.4–4.8)
SODIUM: 140 mmol/L (ref 135–145)

## 2023-09-09 LAB — TACROLIMUS LEVEL, TROUGH: TACROLIMUS, TROUGH: 8 ng/mL (ref 5.0–15.0)

## 2023-09-09 LAB — MAGNESIUM: MAGNESIUM: 1.7 mg/dL (ref 1.6–2.6)

## 2023-09-09 MED ORDER — POTASSIUM CHLORIDE ER 20 MEQ TABLET,EXTENDED RELEASE(PART/CRYST)
ORAL_TABLET | Freq: Every day | ORAL | 0 refills | 20 days
Start: 2023-09-09 — End: ?

## 2023-09-09 MED ORDER — TACROLIMUS 1 MG CAPSULE, IMMEDIATE-RELEASE
ORAL_CAPSULE | Freq: Two times a day (BID) | ORAL | 5 refills | 30 days
Start: 2023-09-09 — End: ?

## 2023-09-09 MED ORDER — FLUCONAZOLE 200 MG TABLET
ORAL_TABLET | Freq: Every day | ORAL | 1 refills | 30 days
Start: 2023-09-09 — End: ?

## 2023-09-09 MED ORDER — MYCOPHENOLATE MOFETIL 500 MG TABLET
ORAL_TABLET | Freq: Three times a day (TID) | ORAL | 0 refills | 18 days
Start: 2023-09-09 — End: ?

## 2023-09-09 MED ORDER — TAMSULOSIN 0.4 MG CAPSULE
ORAL_CAPSULE | Freq: Every evening | ORAL | 1 refills | 30 days
Start: 2023-09-09 — End: ?

## 2023-09-09 MED ORDER — VALACYCLOVIR 500 MG TABLET
ORAL_TABLET | Freq: Two times a day (BID) | ORAL | 11 refills | 30 days
Start: 2023-09-09 — End: ?

## 2023-09-09 MED ORDER — LOPERAMIDE 2 MG CAPSULE
ORAL_CAPSULE | Freq: Four times a day (QID) | ORAL | 0 refills | 4 days | PRN
Start: 2023-09-09 — End: ?

## 2023-09-09 MED ORDER — AMLODIPINE 5 MG TABLET
ORAL_TABLET | Freq: Every day | ORAL | 0 refills | 30 days
Start: 2023-09-09 — End: 2024-09-08

## 2023-09-09 MED ADMIN — letermovir (PREVYMIS) tablet 480 mg: 480 mg | ORAL | @ 12:00:00

## 2023-09-09 MED ADMIN — calcium carbonate (TUMS) chewable tablet 400 mg elem calcium: 400 mg | ORAL | @ 19:00:00

## 2023-09-09 MED ADMIN — metoPROLOL succinate (Toprol-XL) 24 hr tablet 50 mg: 50 mg | ORAL | @ 12:00:00

## 2023-09-09 MED ADMIN — mycophenolate (CELLCEPT) tablet 1,000 mg: 1000 mg | ORAL | @ 12:00:00

## 2023-09-09 MED ADMIN — tamsulosin (FLOMAX) 24 hr capsule 0.4 mg: .4 mg | ORAL

## 2023-09-09 MED ADMIN — calcium carbonate (TUMS) chewable tablet 400 mg elem calcium: 400 mg | ORAL

## 2023-09-09 MED ADMIN — mycophenolate (CELLCEPT) tablet 1,000 mg: 1000 mg | ORAL

## 2023-09-09 MED ADMIN — tacrolimus (PROGRAF) capsule 5 mg: 5 mg | ORAL | @ 12:00:00

## 2023-09-09 MED ADMIN — amlodipine (NORVASC) tablet 10 mg: 10 mg | ORAL | @ 12:00:00

## 2023-09-09 MED ADMIN — hydrALAZINE (APRESOLINE) injection 10 mg: 10 mg | INTRAVENOUS | @ 08:00:00

## 2023-09-09 MED ADMIN — potassium chloride 20 mEq in 100 mL IVPB Premix: 20 meq | INTRAVENOUS | @ 08:00:00 | Stop: 2024-08-15

## 2023-09-09 MED ADMIN — loratadine (CLARITIN) tablet 10 mg: 10 mg | ORAL | @ 12:00:00

## 2023-09-09 MED ADMIN — cholecalciferol (vitamin D3 25 mcg (1,000 units)) tablet 25 mcg: 25 ug | ORAL | @ 12:00:00

## 2023-09-09 MED ADMIN — magnesium sulfate 2gm/50mL IVPB: 2 g | INTRAVENOUS | @ 08:00:00

## 2023-09-09 MED ADMIN — pantoprazole (Protonix) EC tablet 40 mg: 40 mg | ORAL | @ 12:00:00

## 2023-09-09 MED ADMIN — potassium chloride ER tablet 20 mEq: 20 meq | ORAL | @ 12:00:00

## 2023-09-09 MED ADMIN — mycophenolate (CELLCEPT) tablet 1,000 mg: 1000 mg | ORAL | @ 18:00:00

## 2023-09-09 MED ADMIN — valACYclovir (VALTREX) tablet 500 mg: 500 mg | ORAL

## 2023-09-09 MED ADMIN — cyanocobalamin (vitamin B-12) tablet 2,000 mcg: 2000 ug | ORAL | @ 12:00:00

## 2023-09-09 MED ADMIN — calcium carbonate (TUMS) chewable tablet 400 mg elem calcium: 400 mg | ORAL | @ 10:00:00

## 2023-09-09 MED ADMIN — magnesium sulfate 2gm/50mL IVPB: 2 g | INTRAVENOUS | @ 09:00:00

## 2023-09-09 MED ADMIN — fluconazole (DIFLUCAN) tablet 400 mg: 400 mg | ORAL | @ 12:00:00 | Stop: 2023-09-21

## 2023-09-09 MED ADMIN — valACYclovir (VALTREX) tablet 500 mg: 500 mg | ORAL | @ 12:00:00

## 2023-09-09 MED ADMIN — loperamide (IMODIUM) capsule 4 mg: 4 mg | ORAL | @ 16:00:00

## 2023-09-09 MED ADMIN — loperamide (IMODIUM) capsule 4 mg: 4 mg | ORAL | @ 13:00:00

## 2023-09-09 MED ADMIN — potassium chloride 20 mEq in 100 mL IVPB Premix: 20 meq | INTRAVENOUS | @ 09:00:00 | Stop: 2024-08-15

## 2023-09-09 MED ADMIN — magnesium sulfate 2gm/50mL IVPB: 2 g | INTRAVENOUS | @ 10:00:00

## 2023-09-09 MED ADMIN — tacrolimus (PROGRAF) capsule 5 mg: 5 mg | ORAL

## 2023-09-09 NOTE — Unmapped (Addendum)
Bone Marrow Transplant and Cellular Therapy Program  Inpatient Progress Note    Patient Name: Timothy Porter  MRN: [id number]  Encounter Date: 09/09/23    [facility] Myeloma Physician: Dr Vivien Presto Leukemia Physician: Dr. [name]  BMT Attending MD: 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  Type of Transplant: RIC MRD Allo  Graft Source: Fresh PBSCs  Transplant Day: 1564 (Autologous PBSC Unknown Phase - Planned 05/29/2019), 18 (Allogeneic PBSC Unknown Phase - Planned 08/22/2023)     Interval History:  - No acute events overnight  - Engrafted nicely with WBC 7.7, ANC 5.9. Discontinued GCSF 10/11  - Continue with PT/OT today and also referring outpatient PT/OT in prepareing for discharge and signed home Rollator (The beneficiary has a mobility limitation that significantly impairs his/her ability to participate in one or more mobility related activities of daily living in the home. A mobility limitation provides the beneficiary from accomplishing MRADL entirely. The beneficiary is able to safely use the walker)   - Received IV Mg and K overnight and also 20 of K po daily  - HTN noted, restart home amlodipine on 10/10. Increased amlodipine to 10 mg from 5mg  daily 10/12  - Oral intake and Appetite slowing improving  - Repeated C-diff (-) 10/12. Continue with imodium PRN for diarrhea  - Denies Nausea and vomiting   - Afebrile and VSS  - Signed HH infusion, scheduled follow up appointment next week for poss discharge this weekend. But patient prefer to be discharged tomorrow      Review of Systems:  A comprehensive ROS performed and is negative except for pertinent positives as listed above in interval history.     Objective:  Wt Readings from Last 1 Encounters:   09/08/23 (!) 118 kg (260 lb 2.3 oz)     Temp Readings from Last 1 Encounters:   09/09/23 36.8 ??C (98.2 ??F) (Oral)     BP Readings from Last 1 Encounters:   09/09/23 150/72     Pulse Readings from Last 1 Encounters:   09/09/23 90     SpO2 Readings from Last 1 Encounters:   09/09/23 97%     80, Normal activity with effort; some signs or symptoms of disease (ECOG equivalent 1)    Physical Exam:   General: no acute distress noted.   Central venous access: Line clean, dry, intact. No erythema or drainage noted.   ENT: Moist mucous membranes. Oropharhynx without lesions, erythema or exudate.   Cardiovascular: Pulse normal rate, regularity and rhythm. S1 and S2 normal, without any murmur, rub, or gallop.  Lungs: Clear to auscultation bilaterally, without wheezes/crackles/rhonchi. Good air movement.   Skin: Warm, dry, intact. No rash noted.    Psychiatry: Alert and oriented to person, place, and time.   Gastrointestinal/Abdomen: Normoactive bowel sounds, abdomen soft, non-tender   Musculoskeletal/Extremities: FROM throughout. No edema.  Neurologic: CNII-XII intact. Normal strength and sensation throughout    Test Results:   I reviewed all labs from today in Epic. See EMR for lab results.    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  RIC Flu/Bu Conditioning. Regimen Consists of:  1. Fludarabine 30 mg/m2 days -6, -5, -4, -3, -2  2. Busulfan 3.2 mg/kg days -5, -4    Donor: 8/8, ABO O+, CMV positive  Recipient A+  and CMV+  Engraftment: G-CSF starting Day + 5 through WBC recovery (as defined as ANC 1.0 x 2 days or 3.0 x 1 day)  -Date of last G-CSF injection: TBD    GVHD prophylaxis:   1. Cyclophosphamide 50 mg/kg IV daily on days +3 and +4 with mesna  2. Tacrolimus to goal trough (5-10 ng/mL) starting day +5  3. Mycophenolate mofetil 15 mg/kg PO TID (max 1g PO TID) starting day +5 through day +35     Hem:   Transfusion criteria: Transfuse 1 unit of PRBCs for hemoglobin <7 and 1 unit of platelets for Plt <10K or bleeding. [name]. [place] does not have a history of transfusion reactions. Consent was obtained and documented on 08/15/23.    ID:   Prophylaxis:  -Antiviral: Valtrex 500 mg po, increased to BID on admit and continue through 2 years post transplant                   Letermovir 480 mg po to start at day +15 and continue through day +200  -Antifungal: Fluconazole 400 mg po daily, continue through day +75  -Antibacterial: -PJP: Bactrim DS once daily MWF upon platelet engraftment.     Febrile 10/8  --Blood cultures NGTD  - Cefepime (10/8-10/10)    GI:   GERD prophylaxis:  -Home med: Prilosec 40 mg daily. Will switch to pantoprazole while inpatient    Nausea:  -Anti-emetics per BMT protocol.     Hx of IBS with chronic alternating diarrhea/constipation  -Has managed with changing doses of Questran; increased to 1 packet BID in 02/2023 with improvement of diarrhea; self tapered to 0.5 packets BID  -Reports predominantly constipation concerns but would have occasional accidents; says he hasn't soiled himself in months with current dose  -Uses docusate only if no BM 3-4 days  -Meticulously self tracks and titrates medication  -Continue Questran dosing on admission with prn docusate; likely will need to hold if he develops diarrhea  -10/1: Changing Questran to PRN  -10/2: Cdiff negative  - 10/10 Lomotil and Imodium to PRN    Diarrhea:  -10/2: Cdiff negative  -10/4: Imodium QID  -10/5: Added Lomotil q6 hours   - 10/6: Loose/watery stool x4 over last 24 hours. Continue scheduled Imodium/Lomotil, increased Imodium to 4mg  Q6  - 10/10 Lomotil and Imodium to PRN      Renal: Creatinine normal. No issues.   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 SOS prophylaxis not indicated for RIC regimen.   --10/7 LFTs elevated recheck planned for tomorrow  10/8: recheck improved will monitor on regularly scheduled interval    CV:   -Hx of A-fib with transplant in 2020; required diltiazem+metoprolol; resolved post acute episode     * Continues on metoprolol mainly for BP.      * Plan for cardiac electrolytes    -HTN: has discontinued diltiazem prior to the start of venetoclax (for DDI) currently managed with metoprolol monotherapy; decreased to 50 mg/day ~1-2 months ago  -9/20: Adding norvasc 5mg  daily for persistent HTN  10/6 holding amlodipine (Restarted 10/10)  --Added PRN hydral  -10/12: Received one dose of Hydralazine  for BP 172/87 and increased Amlodipine to 10mg  daily     Pulm:   10/8 new oxygen requirement (10/9 off oxygen)  - CXR: shows no acute findings  - RPP negative    ENT:  - Chronic cough when supine from PND? Try flonase or saline spray    GU:   Hx of BPH, previously on flomax will restart 10/2 (having some urinary urgency and incontinence due to mobility issues)   Hold sildenafil (for ED)    Endocrine:  Hx of steroid induced hyperglycemia not currently on medications    Neuro/Pain: No issues     Psych: CCSP consult prn.     Deconditioning:  - PT/OT consulted  - Uses a cane for walking.     Tia Masker  [facility]

## 2023-09-09 NOTE — Unmapped (Incomplete)
Pharmacy Post-Discharge Assessment:    Timothy Porter is a 77 y.o. 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  His transplant course was complicated by ***. The patient was discharged on 10/13 and arrives in clinic today for his first oupatient follow up visit.    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      This electronic signature is attestation that the pathologist personally reviewed the submitted material(s) and the final diagnosis reflects that evaluation.          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,XY[7]         Myeloid Mutational Panel:        Variants of Known/Likely Clinical Significance:   Gene Coding Predicted Protein Variant allele fraction   SRSF2 c.284C>G p.(Pro95Arg) 21.6 %       Treatment Timeline:  11/22/2022 Cycle 1:  BAML S12 azacitidine 75mg /m2 x 7 days + venetoclax 400mg  x 28 days  12/13/22: Bmbx - Limited, though no increase in blasts, MRD-neg by flow, poor specimen but no blasts (at <0.2%)   01/04/2023 Cycle 2: aza 75 x 7 + ven 400 x 28 days (2 week delay)  01/23/2023: BMbx: <5% blasts by CD34 immunohistochemistry, flow MRD negative  02/09/2023: Cycle 3: Aza 75 x 7 + ven 400 x 21  03/19/2023: Cycle 4 Aza 75 x 5 + ven 400 x 21 (Aza decreased due to delayed count recovery)  04/16/2023: Cycle 5: Aza 75 x 5 + ven 400 x 21  05/14/2023: Cycle 6: Aza 75 x 5 + ven 400 x 21  06/11/2023: Cycle 7: Aza 75 x 5 + ven 400 x 21  07/09/2023:  Cycle 8: Aza 75 x 5 + ven 400 x 21    08/02/23 BMBx:  Bone marrow, right iliac, aspiration and biopsy  -  Hypercellular bone marrow (~50%) with erythroid-predominant trilineage hematopoiesis (<1% blasts and <5% plasma cells by manual aspirate differential)  Cytogenetics: pending  MRD: Flow negative <0.02%        Multiple myeloma (CMS-HCC)   02/12/2019 Initial Diagnosis    Multiple myeloma (CMS-HCC)     05/28/2019 - 06/03/2019 Chemotherapy    BMT IP AUTO MELPHALAN  Melphalan 140 mg/m2 or 200 mg/m2 IV Day -1     10/22/2019 - 06/27/2022 Chemotherapy    STUDY [id number] IRB# [id number] LENALIDOMIDE (v. 11/04/18)  A Randomized Study of Daratumumab Plus Lenalidomide Versus Lenalidomide Alone as Maintenance Treatment in Patients with Newly Diagnosed Multiple Myeloma Who Are Minimal Residual Disease Positive After Frontline Autologous Stem Cell Transplant.     History of auto stem cell transplant (CMS-HCC) (Resolved)   08/05/2019 Initial Diagnosis  History of auto stem cell transplant (CMS-HCC)     10/22/2019 - 06/27/2022 Chemotherapy    STUDY [id number] IRB# [id number] LENALIDOMIDE (v. 11/04/18)  A Randomized Study of Daratumumab Plus Lenalidomide Versus Lenalidomide Alone as Maintenance Treatment in Patients with Newly Diagnosed Multiple Myeloma Who Are Minimal Residual Disease Positive After Frontline Autologous Stem Cell Transplant.     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     Current Outpatient Medications   Medication Sig Dispense Refill    letermovir (PREVYMIS) 480 mg tablet Take 1 tablet (480 mg total) by mouth daily. 28 tablet 6    letermovir (PREVYMIS) 480 mg tablet Take 1 tablet (480 mg total) by mouth daily. 28 tablet 6    magnesium sulfate in 0.9 %NaCl 2 gram/50 mL PgBk Infuse 2 g into a venous catheter daily. 700 mL 0     Facility-Administered Medications Ordered in Other Visits   Medication Dose Route Frequency Provider Last Rate Last Admin    acetaminophen (TYLENOL) tablet 650 mg  650 mg Oral Q6H PRN Elpidio Eric, PA   650 mg at 09/04/23 2112    alum-mag-simeth (MAALOX PLUS) 200-200-20 mg/5 mL suspension 60 mL  60 mL Oral Q6H PRN Curt Jews, FNP        [START ON 09/09/2023] amlodipine (NORVASC) tablet 10 mg  10 mg Oral Daily Merril Abbe Xiang, AGNP        calcium carbonate (TUMS) chewable tablet 400 mg elem calcium  400 mg elem calcium Oral TID McElfresh, Megan Royall, PA   400 mg elem calcium at 09/08/23 1343    CETAPHIL skin cleanser   Topical QID PRN Curt Jews, FNP        cholecalciferol (vitamin D3 25 mcg (1,000 units)) tablet 25 mcg  25 mcg Oral Daily McElfresh, Megan Royall, PA   25 mcg at 09/08/23 9147    cholestyramine (QUESTRAN) 4 gram packet 0.5 packet  0.5 packet Oral BID PRN Curt Jews, FNP        cyanocobalamin (vitamin B-12) tablet 2,000 mcg  2,000 mcg Oral Daily McElfresh, Megan Royall, PA   2,000 mcg at 09/08/23 0841    dexAMETHasone (DECADRON) 4 mg/mL injection 20 mg  20 mg Intravenous Once PRN Karn Cassis, MD        diphenhydrAMINE (BENADRYL) injection 25 mg  25 mg Intravenous Q4H PRN Karn Cassis, MD        diphenoxylate-atropine (LOMOTIL) 2.5-0.025 mg per tablet 1 tablet  1 tablet Oral QID PRN Just, Azalee Course, FNP        docusate sodium (COLACE) capsule 50 mg  50 mg Oral BID PRN McElfresh, Megan Royall, PA        DTaP-hepatitis B recombinant-IPV (PEDIARIX) 10 mcg-25Lf-25 mcg-10Lf/0.5 mL injection             DTaP-hepatitis B recombinant-IPV (PEDIARIX) 10 mcg-25Lf-25 mcg-10Lf/0.5 mL injection             emollient combination no.92 (LUBRIDERM) lotion   Topical QID PRN Curt Jews, FNP        EPINEPHrine [facility]) injection 0.3 mg  0.3 mg Intramuscular Daily PRN Karn Cassis, MD        famotidine (PF) (PEPCID) injection 20 mg  20 mg Intravenous Q4H PRN Karn Cassis, MD  fluconazole (DIFLUCAN) tablet 400 mg  400 mg Oral Daily Curt Jews, FNP   400 mg at 09/08/23 0842    fluticasone propionate (FLONASE) 50 mcg/actuation nasal spray 2 spray  2 spray Each Nare Daily PRN Just, Azalee Course, FNP        guaiFENesin (ROBITUSSIN) oral syrup  200 mg Oral Q6H PRN Horton, Merrilee Jansky, AGNP   200 mg at 09/04/23 0001    haemophilus B polysac-tetanus toxoid (ActHIB) 10 mcg/0.5 mL injection             heparin, porcine (PF) 100 unit/mL injection 2 mL  2 mL Intravenous Mon,Wed,Fri Curt Jews, FNP   2 mL at 09/06/23 2306    hydrALAZINE (APRESOLINE) injection 10 mg  10 mg Intravenous Q6H PRN Just, Azalee Course, FNP   10 mg at 09/07/23 2346    influenza vaccine quad (FLUARIX, FLULAVAL, FLUZONE) (6 MOS & UP) 2020-21 ADS Med             IP OKAY TO TREAT   Other Continuous PRN McElfresh, Shaune Pascal, PA        letermovir (PREVYMIS) tablet 480 mg  480 mg Oral Daily Karn Cassis, MD   480 mg at 09/08/23 0841    lidocaine (XYLOCAINE) 2% viscous mucosal solution  10 mL Mouth Q2H PRN Curt Jews, FNP        loperamide (IMODIUM) capsule 2 mg  2 mg Oral Q3H PRN Curt Jews, FNP   2 mg at 09/08/23 4540    loperamide (IMODIUM) capsule 4 mg  4 mg Oral QID PRN Just, Azalee Course, FNP   4 mg at 09/07/23 0954    loratadine (CLARITIN) tablet 10 mg  10 mg Oral Daily McElfresh, Megan Royall, PA   10 mg at 09/08/23 0842    magnesium sulfate 2gm/1mL IVPB  2 g Intravenous Q2H PRN McElfreshAlfonse, Robe, PA 25 mL/hr at 09/08/23 0720 2 g at 09/08/23 0720    menthol (COUGH DROPS) lozenge 1 lozenge  1 lozenge Oral Q2H PRN Horton, Merrilee Jansky, AGNP        methylPREDNISolone sodium succinate (PF) (SOLU-Medrol) injection 125 mg  125 mg Intravenous Q4H PRN Karn Cassis, MD        metoPROLOL succinate (Toprol-XL) 24 hr tablet 50 mg  50 mg Oral Daily McElfresh, Megan Royall, PA   50 mg at 09/08/23 0842    mucositis mixture (with lidocaine)  10 mL Mucous Membrane Q2H PRN Curt Jews, FNP        mycophenolate (CELLCEPT) tablet 1,000 mg  1,000 mg Oral TID Karn Cassis, MD   1,000 mg at 09/08/23 1343    pantoprazole (Protonix) EC tablet 40 mg  40 mg Oral Daily McElfresh, Megan Royall, PA   40 mg at 09/08/23 0842    pneumococcal conjugate (13-valent) (PREVNAR-13) 0.5 mL vaccine             pneumococcal conjugate (13-valent) (PREVNAR-13) 0.5 mL vaccine             potassium chloride 20 mEq in 100 mL IVPB Premix  20 mEq Intravenous Q1H PRN McElfresh, Megan Royall, PA   Stopped at 09/08/23 0710    potassium chloride ER tablet 20 mEq  20 mEq Oral Daily Merril Abbe Berwyn, AGNP   20 mEq at 09/08/23 9811    prochlorperazine (COMPAZINE) tablet 10 mg  10 mg Oral Q6H PRN Curt Jews, FNP   10 mg at 09/04/23 1329  Or    prochlorperazine (COMPAZINE) injection 10 mg  10 mg Intravenous Q6H PRN Curt Jews, FNP   10 mg at 09/02/23 2103    saliva stimulant mucosal spray (BIOTENE)  1 spray Oral Q2H PRN Elpidio Eric, PA   1 spray at 09/02/23 3557    sodium chloride (NS) 0.9 % infusion  20 mL/hr Intravenous Continuous PRN Karn Cassis, MD        sodium chloride (OCEAN) 0.65 % nasal spray 1 spray  1 spray Each Nare QID PRN Horton, Indiya Sade, AGNP        sodium chloride 0.9% (NS) bolus 1,000 mL  1,000 mL Intravenous Daily PRN Karn Cassis, MD        tacrolimus (PROGRAF) capsule 5 mg  5 mg Oral BID Just, Azalee Course, FNP   5 mg at 09/08/23 0840    tamsulosin (FLOMAX) 24 hr capsule 0.4 mg  0.4 mg Oral Nightly Just, Azalee Course, FNP   0.4 mg at 09/07/23 2116    valACYclovir (VALTREX) tablet 500 mg  500 mg Oral BID McElfresh, Megan Royall, PA   500 mg at 09/08/23 3220    varicella-zoster gE-AS01B (PF) (SHINGRIX) 50 mcg/0.5 mL injection Assessment/Plan:    **BMT  - Currently day +1563 (Autologous PBSC Unknown Phase - Planned 05/29/2019), 17 (Allogeneic PBSC Unknown Phase - Planned 08/22/2023) s/p transplant.  - WBC: ***,  ANC ***, Hgb ***, PLT ***    **ID  Prophylaxis:  - antibacterial - None indicated   - antifungal - fluconazole 400 mg PO QDay through day + 75  - antiviral - valacyclovir 500 mg po BID through at least day +365  - PJP - none currently. When platelet recovers: bactrim 1 DS tablet PO QDay Mon/Wed/Fri only through completion of immunosuppression  - CMV - Continue letermovir 480 mg po daily through day +100    **FENGI  - Recommend continuing famotidine for 2 weeks following discharge.  At that time, Timothy Porter may transition to PRN use or DC if there is no ongoing need.   - Continue PRN use of  {Blank single:19197::zofran,compazine} for nausea/vomiting  - Continue PRN use of loperamide for diarrhea    **Neuro/pain  - Continue PRN use of oxycodone for pain control    **[facility]/Bone Health  - 30-day DEXA scan ordered on ***  - F/u results when available to determine need for zoledronic acid infusions    Pharmacy Information: Timothy Porter had prescriptions filled at {Blank single:19197::[facility],***} (phone:{Blank single:19197::[phone],***}). At the time of discharge, there were no issues with acquiring medications, and insurance coverage for all medications was adequate.    I provided Timothy Porter with an updated MedActionPlan at this visit. I would be happy to see Timothy Porter in the future as needed for further medication managment issues.    Medications prescribed or ordered upon discharge were reviewed today and reconciled with the most recent outpatient medication list.  Medication reconciliation was conducted by a clinical pharmacist    I spent *** minutes in direct patient care with this patient    Thank you,    Lilla Shook, PharmD  PGY2 Oncology Pharmacy Resident

## 2023-09-09 NOTE — Unmapped (Signed)
Pt day +18 from allo SCT.  Denies pain/n/v.  3.6 K and 1.7 mag levels; replacing per orders.  Call bell in reach.     Problem: Adult Inpatient Plan of Care  Goal: Plan of Care Review  Outcome: Progressing  Goal: Patient-Specific Goal (Individualized)  Outcome: Progressing  Goal: Absence of Hospital-Acquired Illness or Injury  Outcome: Progressing  Intervention: Identify and Manage Fall Risk  Recent Flowsheet Documentation  Taken 09/08/2023 1905 by Franne Forts, RN  Safety Interventions:   bleeding precautions   fall reduction program maintained   infection management   isolation precautions   lighting adjusted for tasks/safety   low bed   neutropenic precautions   nonskid shoes/slippers when out of bed  Intervention: Prevent Infection  Recent Flowsheet Documentation  Taken 09/08/2023 1905 by Franne Forts, RN  Infection Prevention:   cohorting utilized   environmental surveillance performed   equipment surfaces disinfected   hand hygiene promoted   personal protective equipment utilized   rest/sleep promoted   single patient room provided  Goal: Optimal Comfort and Wellbeing  Outcome: Progressing  Goal: Readiness for Transition of Care  Outcome: Progressing  Goal: Rounds/Family Conference  Outcome: Progressing     Problem: Infection  Goal: Absence of Infection Signs and Symptoms  Outcome: Progressing  Intervention: Prevent or Manage Infection  Recent Flowsheet Documentation  Taken 09/08/2023 1905 by Franne Forts, RN  Infection Management: aseptic technique maintained  Isolation Precautions: protective precautions maintained     Problem: Fall Injury Risk  Goal: Absence of Fall and Fall-Related Injury  Outcome: Progressing  Intervention: Promote Injury-Free Environment  Recent Flowsheet Documentation  Taken 09/08/2023 1905 by Franne Forts, RN  Safety Interventions:   bleeding precautions   fall reduction program maintained   infection management   isolation precautions   lighting adjusted for tasks/safety   low bed   neutropenic precautions   nonskid shoes/slippers when out of bed     Problem: Fall Injury Risk  Goal: Absence of Fall and Fall-Related Injury  Outcome: Progressing  Intervention: Promote Injury-Free Environment  Recent Flowsheet Documentation  Taken 09/08/2023 1905 by Franne Forts, RN  Safety Interventions:   bleeding precautions   fall reduction program maintained   infection management   isolation precautions   lighting adjusted for tasks/safety   low bed   neutropenic precautions   nonskid shoes/slippers when out of bed     Problem: Comorbidity Management  Goal: Blood Pressure in Desired Range  Outcome: Progressing     Problem: Self-Care Deficit  Goal: Improved Ability to Complete Activities of Daily Living  Outcome: Progressing     Problem: Skin Injury Risk Increased  Goal: Skin Health and Integrity  Outcome: Progressing     Problem: Malnutrition  Goal: Improved Nutritional Intake  Outcome: Progressing

## 2023-09-09 NOTE — Unmapped (Signed)
Tacrolimus Therapeutic Monitoring Pharmacy Note    Duilio Stetler is a 77 y.o. male w/ MM who is day +16 from alloHSCT w/ RIC Bu/Flu conditioning and PT-Cy/Tac/MMF GVHD prophylaxis. He is continuing tacrolimus.    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     Date of Transplant:  08/22/23       Prior Dosing Information:  See table below      Source(s) of information used to determine prior to admission dosing: MAR    Goals:  Therapeutic Drug Levels  Tacrolimus trough goal:  5-10 ng/mL    Additional Clinical Monitoring/Outcomes  Monitor renal function (SCr and urine output) and liver function (LFTs)  Monitor for signs/symptoms of adverse events (e.g., hyperglycemia, hyperkalemia, hypomagnesemia, hypertension, headache, tremor)    Results:   Tacrolimus level: 8.0 ng/mL, drawn appropriately    Creatinine   Date Value Ref Range Status   09/08/2023 0.73 0.73 - 1.18 mg/dL Final   24/40/1027 2.53 (L) 0.73 - 1.18 mg/dL Final   66/44/0347 4.25 (L) 0.73 - 1.18 mg/dL Final     Tacrolimus, Trough   Date Value Ref Range Status   09/09/2023 8.0 5.0 - 15.0 ng/mL Final   09/07/2023 6.0 5.0 - 15.0 ng/mL Final   09/05/2023 4.6 (L) 5.0 - 15.0 ng/mL Final      Pharmacokinetic Considerations and Significant Drug Interactions:  Concurrent hepatotoxic medications:  fluconazole  Concurrent CYP3A4 substrates/inhibitors:  fluconazole, letermovir  Concurrent nephrotoxic medications: None identified    Assessment/Plan:  Recommendation(s)  Tacrolimus trough is therapeutic following dose increase on 10/7, with current dose at steady state.   Renal function WNL  Tbili WNL and LFTs downtrending (AST WNL, ALT 52, Alk Phos 125) as of 10/9  He continues fluconazole, which was started on 9/25 and should be at steady state at this time. He recently started letermovir on 10/10 however given 12 hour half life it should be at steady state at this time.  Given the above, plan to continue tacrolimus 5 mg PO BID     Follow-up  Next level has been ordered on 09/10/23 at 0800  A pharmacist will continue to monitor and recommend levels as appropriate    Longitudinal Dose Monitoring:  Date Dose (mg), Route AM Scr (mg/dL) Level  (ng/mL) Key Drug Interactions   9/30 5.5/5.5 0.84  Fluconazole   10/1 5.5/5.5 0.72  Fluconazole   10/2 4/4 0.69 4.4 Fluconazole   10/3 4/4 0.73  Fluconazole   10/4 4/4 0.73 4.3 Fluconazole   10/5 4/4 0.72  Fluconazole   10/6 4/4 0.75  Fluconazole   10/7 4/5 0.72 3.6 Fluconazole   10/8 5/5 0.73  Fluconazole   10/9 5/5 0.78 4.6 Fluconazole   10/10 5/5 0.79  Fluconazole, letermovir   10/11 5/5 0.70 6.0 Fluconazole, letermovir   10/12 5/5 0.71  Fluconazole, letermovir   10/13 5/ -  0.73 8.0 Fluconazole, letermovir     Please page service pharmacist with questions/clarifications.    Priscille Heidelberg, PharmD  PGY2 Oncology Pharmacy Resident

## 2023-09-09 NOTE — Unmapped (Signed)
Patient AOx4, VSS, afebrile. Patient did not receive any PRN medications this shift. No falls or acute events this shift. Plan of Care reviewed with patient.  Safety measures in place of bed low with brakes locked, non-skid footwear on while out of bed, call bell within reach.        Problem: Adult Inpatient Plan of Care  Goal: Plan of Care Review  Outcome: Ongoing - Unchanged  Goal: Patient-Specific Goal (Individualized)  Outcome: Ongoing - Unchanged  Goal: Absence of Hospital-Acquired Illness or Injury  Outcome: Ongoing - Unchanged  Intervention: Identify and Manage Fall Risk  Recent Flowsheet Documentation  Taken 09/08/2023 0718 by [place] Constable, RN  Safety Interventions:   bleeding precautions   commode/urinal/bedpan at bedside   environmental modification   fall reduction program maintained   infection management   isolation precautions   lighting adjusted for tasks/safety   low bed   neutropenic precautions   nonskid shoes/slippers when out of bed  Intervention: Prevent Skin Injury  Recent Flowsheet Documentation  Taken 09/08/2023 0846 by Arizona Constable, RN  Positioning for Skin: Supine/Back  Taken 09/08/2023 0718 by Arizona Constable, RN  Positioning for Skin: Supine/Back  Intervention: Prevent Infection  Recent Flowsheet Documentation  Taken 09/08/2023 0981 by Arizona Constable, RN  Infection Prevention: cohorting utilized  Goal: Optimal Comfort and Wellbeing  Outcome: Ongoing - Unchanged  Goal: Readiness for Transition of Care  Outcome: Ongoing - Unchanged  Goal: Rounds/Family Conference  Outcome: Ongoing - Unchanged     Problem: Infection  Goal: Absence of Infection Signs and Symptoms  Outcome: Ongoing - Unchanged  Intervention: Prevent or Manage Infection  Recent Flowsheet Documentation  Taken 09/08/2023 0718 by Arizona Constable, RN  Infection Management: aseptic technique maintained  Isolation Precautions:   enteric precautions maintained   protective precautions maintained     Problem: Fall Injury Risk  Goal: Absence of Fall and Fall-Related Injury  Outcome: Ongoing - Unchanged  Intervention: Promote Injury-Free Environment  Recent Flowsheet Documentation  Taken 09/08/2023 0718 by Arizona Constable, RN  Safety Interventions:   bleeding precautions   commode/urinal/bedpan at bedside   environmental modification   fall reduction program maintained   infection management   isolation precautions   lighting adjusted for tasks/safety   low bed   neutropenic precautions   nonskid shoes/slippers when out of bed     Problem: Fall Injury Risk  Goal: Absence of Fall and Fall-Related Injury  Outcome: Ongoing - Unchanged  Intervention: Promote Injury-Free Environment  Recent Flowsheet Documentation  Taken 09/08/2023 0718 by Arizona Constable, RN  Safety Interventions:   bleeding precautions   commode/urinal/bedpan at bedside   environmental modification   fall reduction program maintained   infection management   isolation precautions   lighting adjusted for tasks/safety   low bed   neutropenic precautions   nonskid shoes/slippers when out of bed     Problem: Comorbidity Management  Goal: Blood Pressure in Desired Range  Outcome: Ongoing - Unchanged     Problem: Self-Care Deficit  Goal: Improved Ability to Complete Activities of Daily Living  Outcome: Ongoing - Unchanged     Problem: Skin Injury Risk Increased  Goal: Skin Health and Integrity  Outcome: Ongoing - Unchanged  Intervention: Optimize Skin Protection  Recent Flowsheet Documentation  Taken 09/08/2023 0846 by Arizona Constable, RN  Pressure Reduction Techniques: frequent weight shift encouraged  Taken 09/08/2023 1914 by Arizona Constable, RN  Activity Management: up ad lib  Pressure Reduction Techniques: frequent weight shift encouraged  Head of Bed (HOB) Positioning: HOB at 20-30 degrees     Problem: Malnutrition  Goal: Improved Nutritional Intake  Outcome: Ongoing - Unchanged

## 2023-09-10 DIAGNOSIS — Z9484 Stem cells transplant status: Principal | ICD-10-CM

## 2023-09-10 DIAGNOSIS — Z9481 Bone marrow transplant status: Principal | ICD-10-CM

## 2023-09-10 LAB — CBC W/ AUTO DIFF
BASOPHILS ABSOLUTE COUNT: 0 10*9/L (ref 0.0–0.1)
BASOPHILS RELATIVE PERCENT: 0.1 %
EOSINOPHILS ABSOLUTE COUNT: 0 10*9/L (ref 0.0–0.5)
EOSINOPHILS RELATIVE PERCENT: 0 %
HEMATOCRIT: 23.9 % — ABNORMAL LOW (ref 39.0–48.0)
HEMOGLOBIN: 8.7 g/dL — ABNORMAL LOW (ref 12.9–16.5)
LYMPHOCYTES ABSOLUTE COUNT: 0.1 10*9/L — ABNORMAL LOW (ref 1.1–3.6)
LYMPHOCYTES RELATIVE PERCENT: 1.5 %
MEAN CORPUSCULAR HEMOGLOBIN CONC: 36.5 g/dL — ABNORMAL HIGH (ref 32.0–36.0)
MEAN CORPUSCULAR HEMOGLOBIN: 34.6 pg — ABNORMAL HIGH (ref 25.9–32.4)
MEAN CORPUSCULAR VOLUME: 94.7 fL (ref 77.6–95.7)
MEAN PLATELET VOLUME: 8.6 fL (ref 6.8–10.7)
MONOCYTES ABSOLUTE COUNT: 1.9 10*9/L — ABNORMAL HIGH (ref 0.3–0.8)
MONOCYTES RELATIVE PERCENT: 27.5 %
NEUTROPHILS ABSOLUTE COUNT: 4.9 10*9/L (ref 1.8–7.8)
NEUTROPHILS RELATIVE PERCENT: 70.9 %
PLATELET COUNT: 53 10*9/L — ABNORMAL LOW (ref 150–450)
RED BLOOD CELL COUNT: 2.53 10*12/L — ABNORMAL LOW (ref 4.26–5.60)
RED CELL DISTRIBUTION WIDTH: 13.4 % (ref 12.2–15.2)
WBC ADJUSTED: 6.9 10*9/L (ref 3.6–11.2)

## 2023-09-10 LAB — TACROLIMUS LEVEL, TROUGH: TACROLIMUS, TROUGH: 6.5 ng/mL (ref 5.0–15.0)

## 2023-09-10 LAB — PROTIME-INR
INR: 1.18
PROTIME: 13.2 s — ABNORMAL HIGH (ref 9.9–12.6)

## 2023-09-10 LAB — BASIC METABOLIC PANEL
ANION GAP: 10 mmol/L (ref 5–14)
BLOOD UREA NITROGEN: 16 mg/dL (ref 9–23)
BUN / CREAT RATIO: 19
CALCIUM: 8.6 mg/dL — ABNORMAL LOW (ref 8.7–10.4)
CHLORIDE: 107 mmol/L (ref 98–107)
CO2: 22 mmol/L (ref 20.0–31.0)
CREATININE: 0.83 mg/dL
EGFR CKD-EPI (2021) MALE: >90 mL/min/{1.73_m2} (ref >=60)
GLUCOSE RANDOM: 126 mg/dL (ref 70–179)
POTASSIUM: 3.4 mmol/L (ref 3.4–4.8)
SODIUM: 139 mmol/L (ref 135–145)

## 2023-09-10 LAB — HEPATIC FUNCTION PANEL
ALBUMIN: 3.1 g/dL — ABNORMAL LOW (ref 3.4–5.0)
ALKALINE PHOSPHATASE: 125 U/L — ABNORMAL HIGH (ref 46–116)
ALT (SGPT): 26 U/L (ref 10–49)
AST (SGOT): 22 U/L (ref <=34)
BILIRUBIN DIRECT: 0.3 mg/dL (ref 0.00–0.30)
BILIRUBIN TOTAL: 0.5 mg/dL (ref 0.3–1.2)
PROTEIN TOTAL: 5.9 g/dL (ref 5.7–8.2)

## 2023-09-10 LAB — IONIZED CALCIUM VENOUS: CALCIUM IONIZED VENOUS (MG/DL): 4.47 mg/dL (ref 4.40–5.40)

## 2023-09-10 LAB — MAGNESIUM: MAGNESIUM: 1.7 mg/dL (ref 1.6–2.6)

## 2023-09-10 LAB — PHOSPHORUS: PHOSPHORUS: 3.1 mg/dL (ref 2.4–5.1)

## 2023-09-10 LAB — APTT
APTT: 28.9 s (ref 24.8–38.4)
HEPARIN CORRELATION: 0.2

## 2023-09-10 LAB — LACTATE DEHYDROGENASE: LACTATE DEHYDROGENASE: 259 U/L — ABNORMAL HIGH (ref 120–246)

## 2023-09-10 MED ORDER — TAMSULOSIN 0.4 MG CAPSULE
ORAL_CAPSULE | Freq: Every evening | ORAL | 1 refills | 30 days | Status: CP
Start: 2023-09-10 — End: ?
  Filled 2023-09-10: qty 30, 30d supply, fill #0

## 2023-09-10 MED ORDER — VALACYCLOVIR 500 MG TABLET
ORAL_TABLET | Freq: Two times a day (BID) | ORAL | 11 refills | 30 days | Status: CP
Start: 2023-09-10 — End: ?
  Filled 2023-09-10: qty 60, 30d supply, fill #0

## 2023-09-10 MED ORDER — FLUCONAZOLE 200 MG TABLET
ORAL_TABLET | Freq: Every day | ORAL | 1 refills | 30 days | Status: CP
Start: 2023-09-10 — End: ?
  Filled 2023-09-10: qty 60, 30d supply, fill #0

## 2023-09-10 MED ORDER — POTASSIUM CHLORIDE ER 20 MEQ TABLET,EXTENDED RELEASE(PART/CRYST)
ORAL_TABLET | Freq: Every day | ORAL | 0 refills | 10 days | Status: CP
Start: 2023-09-10 — End: ?
  Filled 2023-09-10: qty 20, 10d supply, fill #0

## 2023-09-10 MED ORDER — SULFAMETHOXAZOLE 800 MG-TRIMETHOPRIM 160 MG TABLET
ORAL_TABLET | ORAL | 5 refills | 28 days | Status: CP
Start: 2023-09-10 — End: ?
  Filled 2023-09-10: qty 12, 28d supply, fill #0

## 2023-09-10 MED ORDER — TACROLIMUS 1 MG CAPSULE, IMMEDIATE-RELEASE
ORAL_CAPSULE | Freq: Two times a day (BID) | ORAL | 5 refills | 30 days | Status: CP
Start: 2023-09-10 — End: ?
  Filled 2023-09-10: qty 300, 30d supply, fill #0

## 2023-09-10 MED ORDER — LOPERAMIDE 2 MG CAPSULE
ORAL_CAPSULE | Freq: Four times a day (QID) | ORAL | 0 refills | 4 days | Status: CP | PRN
Start: 2023-09-10 — End: ?
  Filled 2023-09-10: qty 30, 4d supply, fill #0

## 2023-09-10 MED ORDER — MYCOPHENOLATE MOFETIL 500 MG TABLET
ORAL_TABLET | Freq: Three times a day (TID) | ORAL | 0 refills | 17 days | Status: CP
Start: 2023-09-10 — End: ?
  Filled 2023-09-10: qty 98, 17d supply, fill #0

## 2023-09-10 MED ORDER — AMLODIPINE 10 MG TABLET
ORAL_TABLET | ORAL | 1 refills | 30 days | Status: CP
Start: 2023-09-10 — End: ?
  Filled 2023-09-10: qty 30, 30d supply, fill #0

## 2023-09-10 MED ADMIN — metoPROLOL succinate (Toprol-XL) 24 hr tablet 50 mg: 50 mg | ORAL | @ 12:00:00 | Stop: 2023-09-10

## 2023-09-10 MED ADMIN — calcium carbonate (TUMS) chewable tablet 400 mg elem calcium: 400 mg | ORAL | @ 18:00:00 | Stop: 2023-09-10

## 2023-09-10 MED ADMIN — heparin, porcine (PF) 100 unit/mL injection 2 mL: 2 mL | INTRAVENOUS | @ 03:00:00

## 2023-09-10 MED ADMIN — calcium carbonate (TUMS) chewable tablet 400 mg elem calcium: 400 mg | ORAL | @ 12:00:00 | Stop: 2023-09-10

## 2023-09-10 MED ADMIN — loratadine (CLARITIN) tablet 10 mg: 10 mg | ORAL | @ 12:00:00 | Stop: 2023-09-10

## 2023-09-10 MED ADMIN — amlodipine (NORVASC) tablet 10 mg: 10 mg | ORAL | @ 12:00:00 | Stop: 2023-09-10

## 2023-09-10 MED ADMIN — mycophenolate (CELLCEPT) tablet 1,000 mg: 1000 mg | ORAL | @ 12:00:00 | Stop: 2023-09-10

## 2023-09-10 MED ADMIN — magnesium sulfate 2gm/50mL IVPB: 2 g | INTRAVENOUS | @ 05:00:00 | Stop: 2023-09-10

## 2023-09-10 MED ADMIN — magnesium sulfate 2gm/50mL IVPB: 2 g | INTRAVENOUS | @ 07:00:00 | Stop: 2023-09-10

## 2023-09-10 MED ADMIN — potassium chloride ER tablet 20 mEq: 20 meq | ORAL | @ 12:00:00 | Stop: 2023-09-10

## 2023-09-10 MED ADMIN — potassium chloride 20 mEq in 100 mL IVPB Premix: 20 meq | INTRAVENOUS | @ 05:00:00 | Stop: 2023-09-10

## 2023-09-10 MED ADMIN — mycophenolate (CELLCEPT) tablet 1,000 mg: 1000 mg | ORAL | @ 18:00:00 | Stop: 2023-09-10

## 2023-09-10 MED ADMIN — potassium chloride 20 mEq in 100 mL IVPB Premix: 20 meq | INTRAVENOUS | @ 06:00:00 | Stop: 2023-09-10

## 2023-09-10 MED ADMIN — letermovir (PREVYMIS) tablet 480 mg: 480 mg | ORAL | @ 12:00:00 | Stop: 2023-09-10

## 2023-09-10 MED ADMIN — cyanocobalamin (vitamin B-12) tablet 2,000 mcg: 2000 ug | ORAL | @ 12:00:00 | Stop: 2023-09-10

## 2023-09-10 MED ADMIN — loperamide (IMODIUM) capsule 4 mg: 4 mg | ORAL | @ 03:00:00

## 2023-09-10 MED ADMIN — valACYclovir (VALTREX) tablet 500 mg: 500 mg | ORAL | @ 12:00:00 | Stop: 2023-09-10

## 2023-09-10 MED ADMIN — pantoprazole (Protonix) EC tablet 40 mg: 40 mg | ORAL | @ 12:00:00 | Stop: 2023-09-10

## 2023-09-10 MED ADMIN — fluconazole (DIFLUCAN) tablet 400 mg: 400 mg | ORAL | @ 12:00:00 | Stop: 2023-09-10

## 2023-09-10 MED ADMIN — tacrolimus (PROGRAF) capsule 5 mg: 5 mg | ORAL | @ 12:00:00 | Stop: 2023-09-10

## 2023-09-10 MED ADMIN — mycophenolate (CELLCEPT) tablet 1,000 mg: 1000 mg | ORAL | @ 01:00:00

## 2023-09-10 MED ADMIN — loperamide (IMODIUM) capsule 4 mg: 4 mg | ORAL | @ 14:00:00 | Stop: 2023-09-10

## 2023-09-10 MED ADMIN — valACYclovir (VALTREX) tablet 500 mg: 500 mg | ORAL | @ 01:00:00

## 2023-09-10 MED ADMIN — tacrolimus (PROGRAF) capsule 5 mg: 5 mg | ORAL | @ 01:00:00

## 2023-09-10 MED ADMIN — cholecalciferol (vitamin D3 25 mcg (1,000 units)) tablet 25 mcg: 25 ug | ORAL | @ 12:00:00 | Stop: 2023-09-10

## 2023-09-10 MED ADMIN — potassium chloride 20 mEq in 100 mL IVPB Premix: 20 meq | INTRAVENOUS | @ 07:00:00 | Stop: 2023-09-10

## 2023-09-10 MED ADMIN — calcium carbonate (TUMS) chewable tablet 400 mg elem calcium: 400 mg | ORAL | @ 01:00:00

## 2023-09-10 MED ADMIN — tamsulosin (FLOMAX) 24 hr capsule 0.4 mg: .4 mg | ORAL | @ 01:00:00

## 2023-09-10 NOTE — Unmapped (Addendum)
Patient day +18 from [facility]. Patient educated on importance of increasing oral intake for discharge. 4 mg imodium given for diarrhea, moderately relieved with imodium. Discharge packet reviewed with spouse and patient with questions regarding discharge answered. Return demonstration of clave change and line flushing performed by spouse for discharge. Positive for orthostatic BP this evening, educated patient to call when getting out of bed to reduce fall risk. All other VSS this shift.    Problem: Adult Inpatient Plan of Care  Goal: Plan of Care Review  Outcome: Progressing  Goal: Patient-Specific Goal (Individualized)  Outcome: Progressing  Goal: Absence of Hospital-Acquired Illness or Injury  Outcome: Progressing  Intervention: Identify and Manage Fall Risk  Recent Flowsheet Documentation  Taken 09/09/2023 0815 by Janyth Pupa, RN  Safety Interventions:   bleeding precautions   fall reduction program maintained   infection management   isolation precautions   lighting adjusted for tasks/safety   low bed   neutropenic precautions   nonskid shoes/slippers when out of bed  Intervention: Prevent Skin Injury  Recent Flowsheet Documentation  Taken 09/09/2023 0815 by Janyth Pupa, RN  Positioning for Skin: Supine/Back  Device Skin Pressure Protection: adhesive use limited  Skin Protection: adhesive use limited  Intervention: Prevent and Manage VTE (Venous Thromboembolism) Risk  Recent Flowsheet Documentation  Taken 09/09/2023 0815 by Janyth Pupa, RN  VTE Prevention/Management:   fluids promoted   bleeding precautions maintained  Intervention: Prevent Infection  Recent Flowsheet Documentation  Taken 09/09/2023 0815 by Janyth Pupa, RN  Infection Prevention:   environmental surveillance performed   cohorting utilized   equipment surfaces disinfected   hand hygiene promoted   personal protective equipment utilized   rest/sleep promoted   single patient room provided   visitors restricted/screened  Goal: Optimal Comfort and Wellbeing  Outcome: Progressing  Goal: Readiness for Transition of Care  Outcome: Progressing  Goal: Rounds/Family Conference  Outcome: Progressing     Problem: Infection  Goal: Absence of Infection Signs and Symptoms  Outcome: Progressing  Intervention: Prevent or Manage Infection  Recent Flowsheet Documentation  Taken 09/09/2023 0815 by Janyth Pupa, RN  Infection Management: aseptic technique maintained  Isolation Precautions: protective precautions maintained     Problem: Fall Injury Risk  Goal: Absence of Fall and Fall-Related Injury  Outcome: Progressing  Intervention: Identify and Manage Contributors  Recent Flowsheet Documentation  Taken 09/09/2023 0815 by Janyth Pupa, RN  Self-Care Promotion: independence encouraged  Intervention: Promote Injury-Free Environment  Recent Flowsheet Documentation  Taken 09/09/2023 0815 by Janyth Pupa, RN  Safety Interventions:   bleeding precautions   fall reduction program maintained   infection management   isolation precautions   lighting adjusted for tasks/safety   low bed   neutropenic precautions   nonskid shoes/slippers when out of bed     Problem: Fall Injury Risk  Goal: Absence of Fall and Fall-Related Injury  Outcome: Progressing  Intervention: Identify and Manage Contributors  Recent Flowsheet Documentation  Taken 09/09/2023 0815 by Janyth Pupa, RN  Self-Care Promotion: independence encouraged  Intervention: Promote Injury-Free Environment  Recent Flowsheet Documentation  Taken 09/09/2023 0815 by Janyth Pupa, RN  Safety Interventions:   bleeding precautions   fall reduction program maintained   infection management   isolation precautions   lighting adjusted for tasks/safety   low bed   neutropenic precautions   nonskid shoes/slippers when out of bed     Problem: Comorbidity Management  Goal: Blood Pressure in Desired  Range  Outcome: Progressing     Problem: Self-Care Deficit  Goal: Improved Ability to Complete Activities of Daily Living  Outcome: Progressing  Intervention: Promote Activity and Functional Independence  Recent Flowsheet Documentation  Taken 09/09/2023 0815 by Janyth Pupa, RN  Self-Care Promotion: independence encouraged     Problem: Skin Injury Risk Increased  Goal: Skin Health and Integrity  Outcome: Progressing  Intervention: Optimize Skin Protection  Recent Flowsheet Documentation  Taken 09/09/2023 0815 by Janyth Pupa, RN  Pressure Reduction Techniques: frequent weight shift encouraged  Pressure Reduction Devices: pressure-redistributing mattress utilized  Skin Protection: adhesive use limited     Problem: Malnutrition  Goal: Improved Nutritional Intake  Outcome: Progressing

## 2023-09-10 NOTE — Unmapped (Signed)
BMTCTP Discharge Summary    Admit date: 08/16/2023  Discharge date: 09/10/23  Discharge to: Home  Discharge Service: MDT    [place] [facility] Physician: Dr Donalynn Furlong  [facility] Physician: Dr. [name]  BMT Attending MD: 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  Type of Transplant: RIC MRD Allo  Graft Source: Fresh PBSCs  Transplant Day: 1565 (Autologous PBSC Unknown Phase - Planned 05/29/2019), 19 (Allogeneic PBSC Unknown Phase - Planned 08/22/2023)     Procedures: Na  Discharge diagnosis/complications: 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 His transplant course was complicated by diarrhea (hx of IBS with alternating diarrhea/constipation; C diff neg) and fevers (culture neg, treated with Cefepime). Diarrhea required scheduled Lomotil and Imodium initially, but now controlled with PRN Imodium and patient self-titrating given IBS hx. He is incontinent at times. He also had hypomagnesemia requiring daily IV Mag and discharging with 2g IV Mag and hypokalemia discharging with 40k daily. BP medications were adjusted while admitted as he was HTN then developed orthostatic hypotension. He is discharging on 10mg  daily.     Donor information:   Type of stem cells: related male  Blood Type: O+  CMV Status: positive  Type of match: 8/8    Interval History:   - No acute events overnight  - Had 2 incontinent bowel movements yesterday and then was able to get to the commode for 2 bowel movements  - Has been able to walk from the bed to the door and back twice yesterday  - Would like to discharge and feels that family is going to be able to manage helping him get to the bathroom     ROS:  Comprehensive ROS negative except pertinent positives listed in interval history.     Physical exam:  Vitals:    09/10/23 1123   BP: 154/72   Pulse: 96   Resp: 20   Temp: 36.6 ??C (97.8 ??F)   SpO2: 100%     Vitals:    09/08/23 2000 09/09/23 1955   Weight: (!) 118 kg (260 lb 2.3 oz) (!) 117.8 kg (259 lb 11.2 oz)     KPS at discharge: 60, Requires occasional assistance, but is able to care for most of his personal needs (ECOG equivalent 2)    General : No acute distress noted.   Central venous access: Line clean, dry, intact. No erythema or drainage noted.   ENT: Moist mucous membranes. Oropharhynx without lesions, erythema or exudate.   Cardiovascular: Pulse normal rate, regularity and rhythm. S1 and S2 normal, without any murmur, rub, or gallop.  Lungs: Clear to auscultation bilateraly, without wheezes/crackles/rhonchi. Good air movement.   Skin: Warm, dry, intact. No rash noted.   Psychiatry: Alert and oriented to person, place, and time.   GI: Normoactive bowel sounds, abdomen soft, non-tender   Extremeties: No edema.   Musculoskeletal/Extremities: Full range of motion in shoulder, elbow, hip knee, ankle, left hand and feet.  Neurologic: CNII-XII intact. Normal strength and sensation throughout    Lab Results   Component Value Date    WBC 6.9 09/09/2023    HGB 8.7 (L) 09/09/2023    HCT 23.9 (L) 09/09/2023    PLT 53 (  L) 09/09/2023     Lab Results   Component Value Date    NA 139 09/09/2023    K 3.4 09/09/2023    CL 107 09/09/2023    CO2 22.0 09/09/2023    BUN 16 09/09/2023    CREATININE 0.83 09/09/2023    GLU 126 09/09/2023    CALCIUM 8.6 (L) 09/09/2023    MG 1.7 09/09/2023    PHOS 3.1 09/09/2023     Lab Results   Component Value Date    BILITOT 0.5 09/09/2023    BILIDIR 0.30 09/09/2023    PROT 5.9 09/09/2023    ALBUMIN 3.1 (L) 09/09/2023    ALT 26 09/09/2023    AST 22 09/09/2023    ALKPHOS 125 (H) 09/09/2023    GGT 64 06/28/2019     Lab Results   Component Value Date    PT 13.2 (H) 09/09/2023    INR 1.18 09/09/2023    APTT 28.9 09/09/2023     Hospital Course/Assessment and Plan  BMT: AML with prior history of MM. AML now in CR MRD Negative  HCT-CI (age adjusted) 2 (age and hx of A-fib)  Conditioning: RIC Flu/Bu      Donor: 8/8, ABO O+, CMV positive  Recipient A+ and CMV+  Engraftment: G-CSF starting Day + 5 through WBC recovery (as defined as ANC 1.0 x 2 days or 3.0 x 1 day)  -Date of last G-CSF injection: 09/06/23    Date of neutrophil engraftment (first day of ANC >= 500/mm3 for 3 consecutive days): 09/07/23  Date of platelet engraftment (>20K x3 days without transfusion in 7 days): 09/10/23  Number of RBC infusions during transplant hospitalization: 2 units  Number of platelet infusions during transplant hospitalization: 2 units     GVHD prophylaxis:   1. Cyclophosphamide 50 mg/kg IV daily on days +3 and +4 with mesna  2. Tacrolimus to goal trough (5-10 ng/mL) starting day +5  3. Mycophenolate mofetil 15 mg/kg PO TID (max 1g PO TID) starting day +5 through day +35      Hem:   Transfusion criteria: Transfuse 1 unit of PRBCs for hemoglobin <7 and 1 unit of platelets for Plt <10K or bleeding. [name]. [place] does not have a history of transfusion reactions. Consent was obtained and documented on 08/15/23.    ID:   Prophylaxis:  -Antiviral: Valtrex 500 mg po, increased to BID on admit and continue through 2 years post transplant                   Letermovir 480 mg po to start at day +15 and continue through day +200  -Antifungal: Fluconazole 400 mg po daily, continue through day +75  -PJP: Bactrim DS once daily MWF upon platelet engraftment (starting 10/16)     Febrile 10/8  --Blood cultures NGTD  --Cefepime (10/8-10/10)    GI:   GERD prophylaxis:  -Home med: Prilosec 40 mg daily. (switched to pantoprazole while inpatient)     Nausea:  -Zofran as needed     Hx of IBS with chronic alternating diarrhea/constipation  -Has managed with changing doses of Questran; increased to 1 packet BID in 02/2023 with improvement of diarrhea; self tapered to 0.5 packets BID  -Reports predominantly constipation concerns but would have occasional accidents; says he hasn't soiled himself in months with current dose  -Uses docusate only if no BM 3-4 days  -Meticulously self tracks and titrates medication  -Continue Questran dosing on admission with prn docusate; likely will  need to hold if he develops diarrhea  -10/1: Changing Questran to PRN  -10/2: Cdiff negative  -10/10 Lomotil and Imodium to PRN  -10/14: Having 3-4 loose stools a day, using imodium 4mg  QID PRN. Diarrhea is a long standing issue for him, incontinence is not uncommon will need to assess for changes to BM pattern in clinic       Renal: Creatinine normal. No issues.   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 SOS prophylaxis not indicated for RIC regimen.     CV:   -Hx of A-fib with transplant in 2020; required diltiazem+metoprolol; resolved post acute episode     * Continues on metoprolol mainly for BP.      * Plan for cardiac electrolytes     -HTN: has discontinued diltiazem prior to the start of venetoclax (for DDI) currently managed with metoprolol monotherapy; decreased to 50 mg/day ~1-2 months ago  -9/20: Adding norvasc 5mg  daily for persistent HTN  -10/12: Increased Amlodipine to 10mg  daily      Pulm:   10/8 new oxygen requirement (10/9 off oxygen)  - CXR: shows no acute findings  - RPP negative     ENT:  - Chronic cough when supine from PND? Try flonase or saline spray     GU:   Hx of BPH, previously on flomax will restart 10/2 (having some urinary urgency and incontinence due to mobility issues)   Hold sildenafil (for ED)     Endocrine:  Hx of steroid induced hyperglycemia not currently on medications    Neuro/Pain: No issues     Psych: CCSP consult prn.      Deconditioning:  - PT/OT consulted, has OP PT referrals in, has Rolator in his room for discharge  - Uses a cane for walking.   The patient is confined to one room and will need a bedside commode    Labs for first clinic visit entered: Yes.  For allos, please order CBC w/diff, CMP, magnesium, tacrolimus/sirolimus, CMV and T&S.    Condition at Discharge: good    I spent greater than 30 minutes in the discharge of this patient.    Mitzi Hansen, AGNP   [facility]row Transplant [facility] Cellular Therapy [facility]  Discharge Medications:      Your Medication List        STOP taking these medications      cholestyramine 4 gram packet  Commonly known as: QUESTRAN     docusate sodium 50 MG capsule  Commonly known as: COLACE     sildenafiL (pulm.hypertension) 20 mg tablet  Commonly known as: REVATIO     VENCLEXTA 100 mg tablet  Generic drug: venetoclax            START taking these medications      amlodipine 10 MG tablet  Commonly known as: NORVASC  Take 1 tablet (10 mg total) by mouth daily.     fluconazole 200 MG tablet  Commonly known as: DIFLUCAN  Take 2 tablets (400 mg total) by mouth daily.     loperamide 2 mg capsule  Commonly known as: IMODIUM  Take 2 capsules (4 mg total) by mouth four (4) times a day as needed for diarrhea.  magnesium sulfate in 0.9 %NaCl 2 gram/50 mL Pgbk  Infuse 2 g into a venous catheter daily.     mycophenolate 500 mg tablet  Commonly known as: CELLCEPT  Take 2 tablets (1,000 mg total) by mouth Three (3) times a day.     potassium chloride 20 MEQ ER tablet  Take 2 tablets (40 mEq total) by mouth daily.     PREVYMIS 480 mg tablet  Generic drug: letermovir  Take 1 tablet (480 mg total) by mouth daily.     sulfamethoxazole-trimethoprim 800-160 mg per tablet  Commonly known as: BACTRIM DS  Take 1 tablet (160 mg of trimethoprim total) by mouth every Monday, Wednesday, and Friday.     tacrolimus 1 MG capsule  Commonly known as: PROGRAF  Take 5 capsules (5 mg total) by mouth two (2) times a day.     tamsulosin 0.4 mg capsule  Commonly known as: FLOMAX  Take 1 capsule (0.4 mg total) by mouth nightly.            CHANGE how you take these medications      valACYclovir 500 MG tablet  Commonly known as: VALTREX  Take 1 tablet (500 mg total) by mouth two (2) times a day.  What changed: when to take this            CONTINUE taking these medications      calcium carbonate 200 mg calcium (500 mg) chewable tablet  Commonly known as: TUMS  Chew 2 tablets (400 mg of elem calcium total) Three (3) times a day.     cholecalciferol (vitamin D3 25 mcg (1,000 units)) 1,000 unit (25 mcg) tablet  Take 1 tablet (25 mcg total) by mouth.     cyanocobalamin (vitamin B-12) 2,000 mcg Tab  Take 2,000 mcg by mouth in the morning.     loratadine 10 mg tablet  Commonly known as: CLARITIN  Take 1 tablet (10 mg total) by mouth daily.     metoPROLOL succinate 50 MG 24 hr tablet  Commonly known as: Toprol-XL  Take 1 tablet (50 mg total) by mouth daily.     multivitamin with minerals tablet  Take 1 tablet by mouth daily.     omeprazole 40 MG capsule  Commonly known as: PriLOSEC  TAKE 1 CAPSULE(40 MG) BY MOUTH DAILY     ondansetron 8 MG tablet  Commonly known as: ZOFRAN  Take 1 tablet (8 mg total) by mouth every eight (8) hours as needed for nausea.              Pending Test Results:   Pending Labs       Order Current Status    CMV DNA, quantitative, PCR In process    Epstein Barr Viral Load, Quant In process            Discharge Instructions:     Other Instructions       Discharge instructions      Take your temperature two times a day. If it is 100.4 or higher, call the BMT clinic or go to your local emergency room. This may be a medical emergency. Inform your provider that you recently received chemotherapy and had a stem cell transplant. You may have blood drawn for blood cultures and receive IV antibiotics.    Diet: No fresh fruits, fresh vegetables, or uncooked pepper until you are instructed to do otherwise by your clinic provider.    Activity: Increase your activity as tolerated.  Central line site: Change your dressing once a week or if it becomes dirty, gets wet or pulls up. Call the clinic (or inpatient unit if after hours) if your catheter site is red, warm, swollen, or tender. These may be signs of infection.    Symptom management: Call the clinic (or inpatient unit if after hours) if you have nausea, vomiting, diarrhea, or pain that is not controlled with your medications. Also call if you develop a new rash.    Please call the clinic (or inpatient unit if after hours) if you have any questions or concerns.    --------  For questions or concerns Monday through Friday 8 AM-4:30 PM, please call [phone]    For after hours calls, weekends and holidays, please call [phone]. This is the inpatient nurse's station. They will contact the provider for you.     For appointments Monday through Friday 8 AM-4:30 PM, please call [phone]     [facility]  [address]  [place], [place] [address]  [email or web address]    Physician communication order      NOTE: If actual weight is less than ideal body weight, then use actual weight as the corrected weight for cycloPHOSphamide & mesna dosages.        Follow Up instructions and Outpatient Referrals     Ambulatory Referral to Occupational Therapy      Special Instructions: evaluate and treat    Reason for referral: outpatient OT    Ambulatory Referral to Physical Therapy      Special Instructions: evaluate and treat    Reason for referral: outpatient PT    Ambulatory Referral to Home Infusion      Reason for referral: home infusion    Performing location?: External    Home Health Requested Disciplines: Nursing     **Please contact your service pharmacist for assistance with discharge   home health infusion monitoring.      Discharge instructions        Appointments which have been scheduled for you      Sep 12, 2023 1:00 PM  (Arrive by 12:30 PM)  BMT LAB with ONCBMT LABS  [facility] BMT [place] [facility] REGION) [address]  [place] [address]  [phone]        Sep 12, 2023 1:30 PM  (Arrive by 1:00 PM)  RETURN  COMPLEX with Rulon Abide, PharmD CPP  [facility] BMT [place] [facility] REGION) [address]  [place] [address]  [phone]        Sep 12, 2023 2:00 PM  (Arrive by 1:30 PM)  RETURN FOLLOW UP [place] with ONCBMT APP B  [facility] BMT [place] [facility] REGION) [address]  [place] [address]  [phone]        Sep 14, 2023 2:30 PM  (Arrive by 2:00 PM)  BMT LAB with Carnella Guadalajara  [facility] BMT [place] [facility] REGION) [address]  [place] [address]  [phone]        Sep 14, 2023 3:00 PM  (Arrive by 2:30 PM)  RETURN FOLLOW UP [place] with ONCBMT APP A  [facility] BMT [place] [facility] REGION) [address]  [place] [address]  [phone]        Sep 18, 2023 2:00 PM  (Arrive by 1:30 PM)  BMT LAB with ONCBMT LABS  [facility] BMT [place]) [address]  [place] [address]  [phone]        Sep 18, 2023 3:00 PM  (Arrive by 2:30 PM)  RETURN FOLLOW UP [place] with Margretta Ditty, MD  [facility] BMT [place] [facility] REGION) [address]  [place] [address]  [phone]        Sep 25, 2023 9:15 AM  (Arrive by 8:45 AM)  BMT LAB with Carnella Guadalajara  [facility] BMT [place] [facility] REGION) [address]  [place] [address]  [phone]        Sep 25, 2023 10:00 AM  (Arrive by 9:30 AM)  RETURN FOLLOW UP [place] with ONCBMT APP B  [facility] BMT [place] [facility] REGION) [address]  [place] [address]  [phone]        Sep 25, 2023 11:00 AM  (Arrive by 10:30 AM)  BONE MARROW BIOPSY with [employer] PROCEDURES  [place] [facility] [place] [facility] REGION) [address]  [place] [address]  [phone]        Oct 02, 2023 3:30 PM  (Arrive by 3:15 PM)  RETURN VIDEO MYCHART with Ruthell Rummage, MD  [facility] REGION) [address]  [address] Floor  [place] [address]  [phone]   Please sign into My [place] Chart at least 15 minutes before your appointment to complete the eCheck-In process. You must complete eCheck-In before you can start your video visit. We also recommend testing your audio and video connection to troubleshoot any issues before your visit begins. Click ???Join Video Visit??? to complete these checks. Once you have completed eCheck-In and tested your audio and video, click ???Join Call??? to connect to your visit.     For your video visit, you will need a computer with a working camera, speaker and microphone, a smartphone, or a tablet with internet access.    My [place] Chart enables you to manage your health, send non-urgent messages to your provider, view your test results, schedule and manage appointments, and request prescription refills securely and conveniently from your computer or mobile device.    You can go to [email or web address] to sign in to your My [place] Chart account with your username and password. If you have forgotten your username or password, please choose the ???Forgot Username???? and/or ???Forgot Password???? links to gain access. You also can access your My [place] Chart account with the free MyChart mobile app for Android or iPhone.    If you need assistance accessing your My [place] Chart account or for assistance in reaching your provider's office to reschedule or cancel your appointment, please call [facility] [phone].         Oct 30, 2023 10:00 AM  (Arrive by 9:30 AM)  BMT LAB with Carnella Guadalajara  [facility] BMT [place] [facility] REGION) [address]  [place] [address]  [phone]        Oct 30, 2023 11:00 AM  (Arrive by 10:30 AM)  RETURN FOLLOW UP [place] with Margretta Ditty, MD  [facility] BMT [place] [facility] REGION) [address]  [place] [address]  [phone]        Nov 15, 2023 9:00 AM  (Arrive by 8:30 AM)  BMT LAB with ONCBMT LABS  [facility]  ([place]) [address]  [place] [address]  [phone]        Nov 15, 2023 10:00 AM  (Arrive by 9:30 AM)  RETURN FOLLOW UP [place] with ONCBMT APP A  [facility] BMT [place] [facility] REGION) [address]  [place] [address]  [phone]        Nov 15, 2023 11:00 AM  (Arrive by 10:30 AM)  BONE MARROW BIOPSY with [employer] PROCEDURES  [place] [facility]COLOGY INFUSION [facility] REGION) [address]  [place] [address]  [phone]        Nov 20, 2023 10:30 AM  (Arrive by 10:00 AM)  BMT LAB with Carnella Guadalajara  [facility] BMT [place] [facility] REGION) [address]  [place] [address]  [phone]        Nov 20, 2023 11:30 AM  (Arrive by 11:00 AM)  RETURN FOLLOW UP [place] with Margretta Ditty, MD  [facility] BMT [place] [facility] REGION) [address]  [place] [address]  [phone]        Dec 05, 2023 8:15 AM  (Arrive by 7:45 AM)  BMT LAB with Carnella Guadalajara  [facility] BMT [place] [facility] REGION) [address]  [place] [address]  [phone]        Dec 05, 2023 8:30 AM  (Arrive by 8:00 AM)  NURSE COORDINATOR with [employer] COORDINATOR  [facility] BMT [place] [facility] REGION) [address]  [place] [address]  [phone]        Dec 05, 2023 9:00 AM  (Arrive by 8:30 AM)  RETURN FOLLOW UP [place] with ONCBMT PHARMACY  [facility] BMT  [place] [facility] REGION) [address]  [place] [address]  [phone]        Dec 05, 2023 10:00 AM  (Arrive by 9:30 AM)  RETURN FOLLOW UP [place] with ONCBMT APP B  [facility] BMT  [facility] REGION) [address]  [place] [address]  [phone] Feb 20, 2024 9:30 AM  (Arrive by 9:00 AM)  BMT LAB with Carnella Guadalajara  [facility] BMT [employer] [place] [facility] REGION) [address]  [place] [address]  [phone]        Feb 20, 2024 10:30 AM  (Arrive by 10:00 AM)  RETURN FOLLOW UP [place] with ONCBMT PHARMACY  [facility] BMT  [facility] REGION) [address]  [place] [address]  [phone]

## 2023-09-10 NOTE — Unmapped (Signed)
Pt day +19 from allo transplant. Discharge teaching as well as line teaching has been completed by pt and caregiver. Pt VSS, remained afebrile throughout shift. Pt complained of diarrhea, Imodium given.     Problem: Adult Inpatient Plan of Care  Goal: Plan of Care Review  Outcome: Resolved  Goal: Patient-Specific Goal (Individualized)  Outcome: Resolved  Goal: Absence of Hospital-Acquired Illness or Injury  Outcome: Resolved  Intervention: Identify and Manage Fall Risk  Recent Flowsheet Documentation  Taken 09/10/2023 0745 by Holli Humbles, RN  Safety Interventions:   aspiration precautions   environmental modification   fall reduction program maintained   commode/urinal/bedpan at bedside   family at bedside   isolation precautions   lighting adjusted for tasks/safety   low bed   muscle strengthening facilitated   neutropenic precautions   nonskid shoes/slippers when out of bed  Intervention: Prevent Skin Injury  Recent Flowsheet Documentation  Taken 09/10/2023 0815 by Holli Humbles, RN  Positioning for Skin: Supine/Back  Intervention: Prevent Infection  Recent Flowsheet Documentation  Taken 09/10/2023 0745 by Holli Humbles, RN  Infection Prevention:   cohorting utilized   equipment surfaces disinfected   environmental surveillance performed   hand hygiene promoted   personal protective equipment utilized   rest/sleep promoted   single patient room provided   visitors restricted/screened  Goal: Optimal Comfort and Wellbeing  Outcome: Resolved  Goal: Readiness for Transition of Care  Outcome: Resolved  Goal: Rounds/Family Conference  Outcome: Resolved     Problem: Infection  Goal: Absence of Infection Signs and Symptoms  Outcome: Resolved  Intervention: Prevent or Manage Infection  Recent Flowsheet Documentation  Taken 09/10/2023 0745 by Holli Humbles, RN  Infection Management: aseptic technique maintained  Isolation Precautions: protective precautions maintained     Problem: Fall Injury Risk  Goal: Absence of Fall and Fall-Related Injury  Outcome: Resolved  Intervention: Promote Injury-Free Environment  Recent Flowsheet Documentation  Taken 09/10/2023 0745 by Holli Humbles, RN  Safety Interventions:   aspiration precautions   environmental modification   fall reduction program maintained   commode/urinal/bedpan at bedside   family at bedside   isolation precautions   lighting adjusted for tasks/safety   low bed   muscle strengthening facilitated   neutropenic precautions   nonskid shoes/slippers when out of bed     Problem: Fall Injury Risk  Goal: Absence of Fall and Fall-Related Injury  Outcome: Resolved  Intervention: Promote Injury-Free Environment  Recent Flowsheet Documentation  Taken 09/10/2023 0745 by Holli Humbles, RN  Safety Interventions:   aspiration precautions   environmental modification   fall reduction program maintained   commode/urinal/bedpan at bedside   family at bedside   isolation precautions   lighting adjusted for tasks/safety   low bed   muscle strengthening facilitated   neutropenic precautions   nonskid shoes/slippers when out of bed     Problem: Comorbidity Management  Goal: Blood Pressure in Desired Range  Outcome: Resolved     Problem: Self-Care Deficit  Goal: Improved Ability to Complete Activities of Daily Living  Outcome: Resolved     Problem: Skin Injury Risk Increased  Goal: Skin Health and Integrity  Outcome: Resolved  Intervention: Optimize Skin Protection  Recent Flowsheet Documentation  Taken 09/10/2023 0815 by Holli Humbles, RN  Pressure Reduction Techniques: frequent weight shift encouraged  Pressure Reduction Devices: pressure-redistributing mattress utilized     Problem: Malnutrition  Goal: Improved Nutritional Intake  Outcome: Resolved

## 2023-09-10 NOTE — Unmapped (Signed)
Day + 19. Vitals signs: remained WNL this shift. Pain: denies. Nausea/vomitting: denies. Diarrhea: pt received imodium prn x1 this shift. Blood/electrolyte replacements: 4gm magnesium and 60 meq potassium given per order parameters. Other: Tolerated scheduled medications, no falls or injuries, neutropenic precautions maintained. Pt is resting in bed quietly at this time.      Problem: Adult Inpatient Plan of Care  Goal: Plan of Care Review  Outcome: Progressing  Goal: Patient-Specific Goal (Individualized)  Outcome: Progressing  Goal: Absence of Hospital-Acquired Illness or Injury  Outcome: Progressing  Intervention: Identify and Manage Fall Risk  Recent Flowsheet Documentation  Taken 09/09/2023 1945 by Rod Mae, RN  Safety Interventions:   bleeding precautions   aspiration precautions   environmental modification   fall reduction program maintained   family at bedside   infection management   isolation precautions   lighting adjusted for tasks/safety   low bed   neutropenic precautions   nonskid shoes/slippers when out of bed  Intervention: Prevent Skin Injury  Recent Flowsheet Documentation  Taken 09/09/2023 1945 by Rod Mae, RN  Positioning for Skin: Supine/Back  Device Skin Pressure Protection: adhesive use limited  Skin Protection: adhesive use limited  Intervention: Prevent and Manage VTE (Venous Thromboembolism) Risk  Recent Flowsheet Documentation  Taken 09/09/2023 2039 by Rod Mae, RN  VTE Prevention/Management:   fluids promoted   bleeding precautions maintained  Anti-Embolism Intervention: Off  Intervention: Prevent Infection  Recent Flowsheet Documentation  Taken 09/09/2023 1945 by Rod Mae, RN  Infection Prevention:   environmental surveillance performed   equipment surfaces disinfected   hand hygiene promoted   personal protective equipment utilized   rest/sleep promoted   single patient room provided   visitors restricted/screened  Goal: Optimal Comfort and Wellbeing  Outcome: Progressing  Goal: Readiness for Transition of Care  Outcome: Progressing  Goal: Rounds/Family Conference  Outcome: Progressing     Problem: Infection  Goal: Absence of Infection Signs and Symptoms  Outcome: Progressing  Intervention: Prevent or Manage Infection  Recent Flowsheet Documentation  Taken 09/09/2023 1945 by Rod Mae, RN  Infection Management: aseptic technique maintained  Isolation Precautions: protective precautions maintained     Problem: Fall Injury Risk  Goal: Absence of Fall and Fall-Related Injury  Outcome: Progressing  Intervention: Identify and Manage Contributors  Recent Flowsheet Documentation  Taken 09/09/2023 2039 by Rod Mae, RN  Self-Care Promotion: independence encouraged  Intervention: Promote Injury-Free Environment  Recent Flowsheet Documentation  Taken 09/09/2023 1945 by Rod Mae, RN  Safety Interventions:   bleeding precautions   aspiration precautions   environmental modification   fall reduction program maintained   family at bedside   infection management   isolation precautions   lighting adjusted for tasks/safety   low bed   neutropenic precautions   nonskid shoes/slippers when out of bed     Problem: Fall Injury Risk  Goal: Absence of Fall and Fall-Related Injury  Outcome: Progressing  Intervention: Identify and Manage Contributors  Recent Flowsheet Documentation  Taken 09/09/2023 2039 by Rod Mae, RN  Self-Care Promotion: independence encouraged  Intervention: Promote Injury-Free Environment  Recent Flowsheet Documentation  Taken 09/09/2023 1945 by Rod Mae, RN  Safety Interventions:   bleeding precautions   aspiration precautions   environmental modification   fall reduction program maintained   family at bedside   infection management   isolation precautions   lighting adjusted for tasks/safety   low bed   neutropenic precautions   nonskid shoes/slippers when out of bed  Problem: Comorbidity Management  Goal: Blood Pressure in Desired Range  Outcome: Progressing     Problem: Self-Care Deficit  Goal: Improved Ability to Complete Activities of Daily Living  Outcome: Progressing  Intervention: Promote Activity and Functional Independence  Recent Flowsheet Documentation  Taken 09/09/2023 2039 by Rod Mae, RN  Self-Care Promotion: independence encouraged     Problem: Skin Injury Risk Increased  Goal: Skin Health and Integrity  Outcome: Progressing  Intervention: Optimize Skin Protection  Recent Flowsheet Documentation  Taken 09/09/2023 1945 by Rod Mae, RN  Activity Management: up ad lib  Pressure Reduction Techniques: frequent weight shift encouraged  Pressure Reduction Devices: pressure-redistributing mattress utilized  Skin Protection: adhesive use limited  Intervention: Promote and Optimize Oral Intake  Recent Flowsheet Documentation  Taken 09/09/2023 1945 by Rod Mae, RN  Oral Nutrition Promotion: physical activity promoted     Problem: Malnutrition  Goal: Improved Nutritional Intake  Outcome: Progressing  Intervention: Promote and Optimize Oral Intake  Recent Flowsheet Documentation  Taken 09/09/2023 1945 by Rod Mae, RN  Oral Nutrition Promotion: physical activity promoted

## 2023-09-11 LAB — CMV DNA, QUANTITATIVE, PCR
CMV QUANT: <35 [IU]/mL — ABNORMAL HIGH (ref <0)
CMV VIRAL LD: DETECTED — AB

## 2023-09-11 LAB — EBV QUANTITATIVE PCR, BLOOD: EBV VIRAL LOAD RESULT: NOT DETECTED

## 2023-09-11 NOTE — Unmapped (Incomplete)
Pharmacy Post-Discharge Assessment:    Timothy Porter is a 77 y.o. 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  His transplant course was complicated by diarrhea (hx of IBS with alternating diarrhea/constipation; C diff neg) and fever. He also developed hypomagnesemia and hypokalemia on supplementation. The patient was discharged on 10/13 and arrives in clinic today for his first oupatient follow up visit.    Interval History:    Hematology/Oncology History Overview Note   Referring/Local Oncologist:  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      This electronic signature is attestation that the pathologist personally reviewed the submitted material(s) and the final diagnosis reflects that evaluation.          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,XY[7]         Myeloid Mutational Panel:        Variants of Known/Likely Clinical Significance:   Gene Coding Predicted Protein Variant allele fraction   SRSF2 c.284C>G p.(Pro95Arg) 21.6 %       Treatment Timeline:  11/22/2022 Cycle 1:  BAML S12 azacitidine 75mg /m2 x 7 days + venetoclax 400mg  x 28 days  12/13/22: Bmbx - Limited, though no increase in blasts, MRD-neg by flow, poor specimen but no blasts (at <0.2%)   01/04/2023 Cycle 2: aza 75 x 7 + ven 400 x 28 days (2 week delay)  01/23/2023: BMbx: <5% blasts by CD34 immunohistochemistry, flow MRD negative  02/09/2023: Cycle 3: Aza 75 x 7 + ven 400 x 21  03/19/2023: Cycle 4 Aza 75 x 5 + ven 400 x 21 (Aza decreased due to delayed count recovery)  04/16/2023: Cycle 5: Aza 75 x 5 + ven 400 x 21  05/14/2023: Cycle 6: Aza 75 x 5 + ven 400 x 21  06/11/2023: Cycle 7: Aza 75 x 5 + ven 400 x 21  07/09/2023:  Cycle 8: Aza 75 x 5 + ven 400 x 21    08/02/23 BMBx:  Bone marrow, right iliac, aspiration and biopsy  -  Hypercellular bone marrow (~50%) with erythroid-predominant trilineage hematopoiesis (<1% blasts and <5% plasma cells by manual aspirate differential)  Cytogenetics: pending  MRD: Flow negative <0.02%        Multiple myeloma (CMS-HCC)   02/12/2019 Initial Diagnosis    Multiple myeloma (CMS-HCC)     05/28/2019 - 06/03/2019 Chemotherapy    BMT IP AUTO MELPHALAN  Melphalan 140 mg/m2 or 200 mg/m2 IV Day -1     10/22/2019 - 06/27/2022 Chemotherapy    STUDY [id number] IRB# [id number] LENALIDOMIDE (v. 11/04/18)  A Randomized Study of Daratumumab Plus Lenalidomide Versus Lenalidomide Alone as Maintenance Treatment in Patients with Newly Diagnosed Multiple Myeloma Who Are Minimal Residual Disease Positive After Frontline Autologous Stem  Cell Transplant.     History of auto stem cell transplant (CMS-HCC) (Resolved)   08/05/2019 Initial Diagnosis    History of auto stem cell transplant (CMS-HCC)     10/22/2019 - 06/27/2022 Chemotherapy    STUDY [id number] IRB# [id number] LENALIDOMIDE (v. 11/04/18)  A Randomized Study of Daratumumab Plus Lenalidomide Versus Lenalidomide Alone as Maintenance Treatment in Patients with Newly Diagnosed Multiple Myeloma Who Are Minimal Residual Disease Positive After Frontline Autologous Stem Cell Transplant.     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 30 tablet 1    calcium carbonate (TUMS) 200 mg calcium (500 mg) chewable tablet Chew 2 tablets (400 mg of elem calcium total) Three (3) times a day.      cholecalciferol, vitamin D3 25 mcg, 1,000 units,, 1,000 unit (25 mcg) tablet Take 1 tablet (25 mcg total) by mouth.      cyanocobalamin, vitamin B-12, 2,000 mcg Tab Take 2,000 mcg by mouth in the morning. 90 tablet 3    fluconazole (DIFLUCAN) 200 MG tablet Take 2 tablets (400 mg total) by mouth daily. 60 tablet 1    letermovir (PREVYMIS) 480 mg tablet Take 1 tablet (480 mg total) by mouth daily. 28 tablet 6    loperamide (IMODIUM) 2 mg capsule Take 2 capsules (4 mg total) by mouth four (4) times a day as needed for diarrhea. 30 capsule 0    loratadine (CLARITIN) 10 mg tablet Take 1 tablet (10 mg total) by mouth daily.      magnesium sulfate in 0.9 %NaCl 2 gram/50 mL PgBk Infuse 2 g into a venous catheter daily. 700 mL 0    metoPROLOL succinate (TOPROL-XL) 50 MG 24 hr tablet Take 1 tablet (50 mg total) by mouth daily. 30 tablet 11    multivitamin with minerals tablet Take 1 tablet by mouth daily.      mycophenolate (CELLCEPT) 500 mg tablet Take 2 tablets (1,000 mg total) by mouth Three (3) times a day. 98 tablet 0    omeprazole (PRILOSEC) 40 MG capsule TAKE 1 CAPSULE(40 MG) BY MOUTH DAILY 90 capsule 0    ondansetron (ZOFRAN) 8 MG tablet Take 1 tablet (8 mg total) by mouth every eight (8) hours as needed for nausea. 30 tablet 2    potassium chloride 20 MEQ ER tablet Take 2 tablets (40 mEq total) by mouth daily. 20 tablet 0    sulfamethoxazole-trimethoprim (BACTRIM DS) 800-160 mg per tablet Take 1 tablet (160 mg of trimethoprim total) by mouth every Monday, Wednesday, and Friday. 12 tablet 5    tacrolimus (PROGRAF) 1 MG capsule Take 5 capsules (5 mg total) by mouth two (2) times a day. 300 capsule 5    tamsulosin (FLOMAX) 0.4 mg capsule Take 1 capsule (0.4 mg total) by mouth nightly. 30 capsule 1    valACYclovir (VALTREX) 500 MG tablet Take 1 tablet (500 mg total) by mouth two (2) times a day. 60 tablet 11     No current facility-administered medications for this visit.     Facility-Administered Medications Ordered in Other Visits   Medication Dose Route Frequency Provider Last Rate Last Admin    DTaP-hepatitis B recombinant-IPV (PEDIARIX) 10 mcg-25Lf-25 mcg-10Lf/0.5 mL injection  DTaP-hepatitis B recombinant-IPV (PEDIARIX) 10 mcg-25Lf-25 mcg-10Lf/0.5 mL injection             haemophilus B polysac-tetanus toxoid (ActHIB) 10 mcg/0.5 mL injection             influenza vaccine quad (FLUARIX, FLULAVAL, FLUZONE) (6 MOS & UP) 2020-21 ADS Med             pneumococcal conjugate (13-valent) (PREVNAR-13) 0.5 mL vaccine             pneumococcal conjugate (13-valent) (PREVNAR-13) 0.5 mL vaccine             varicella-zoster gE-AS01B (PF) (SHINGRIX) 50 mcg/0.5 mL injection                Assessment/Plan:    **BMT  - Currently day +1566 (Autologous PBSC Unknown Phase - Planned 05/29/2019), 20 (Allogeneic PBSC Unknown Phase - Planned 08/22/2023) s/p transplant.  - WBC: ***,  ANC ***, Hgb ***, PLT ***    **ID  Prophylaxis:  - antibacterial - None indicated   - antifungal - fluconazole 400 mg PO QDay through day + 75  - antiviral - valacyclovir 500 mg po BID through at least day +365  - PJP - none currently. Plan to start bactrim 1 DS tablet PO QDay Mon/Wed/Fri only through completion of immunosuppression when platelet count recovers   - CMV - Continue letermovir 480 mg po daily through day +100    **GVHD ppx  - s/p PT-Cy D+3 and 4  - Continue mycophenolate 1000 mg PO TID through D+35 if no active GVHD present  - Currently on tacrolimus 5 mg PO BID. Today's level pending.     **FENGI  - Continue magnesium 2g IV daily   - Continue potassium chloride 40 mEq PO daily   - Continue calcium carbonate 2 tabs TID  - Continue PTA omeprazole 40 mg daily for GERD.   - Continue PRN use of  zofran for nausea/vomiting  - Continue PRN use of loperamide for diarrhea  - Continue PRN use of docusate for constipation     **CV  - Continue PTA metoprolol succinate 50 mg daily   - Continue amlodipine 10 mg daily     **BPH  - Continue flomax 0.4 mg daily     **[facility]/Bone Health  - 30-day DEXA scan ordered on ***  - F/u results when available to determine need for zoledronic acid infusions    Pharmacy Information: Mr. Alcock had prescriptions filled at [facility] (phone:[phone]). At the time of discharge, there were no issues with acquiring medications, and insurance coverage for all medications was adequate.    I provided Mr. Forcier with an updated MedActionPlan at this visit. I would be happy to see Mr. Aguinaldo in the future as needed for further medication managment issues.    Medications prescribed or ordered upon discharge were reviewed today and reconciled with the most recent outpatient medication list.  Medication reconciliation was conducted by a clinical pharmacist    I spent *** minutes in direct patient care with this patient    Thank you,    Lilla Shook, PharmD  PGY2 Oncology Pharmacy Resident

## 2023-09-12 ENCOUNTER — Encounter
Admit: 2023-09-12 | Discharge: 2023-09-16 | Disposition: A | Discharge status: Home Health | Payer: MEDICARE | Admitting: Internal Medicine

## 2023-09-12 ENCOUNTER — Ambulatory Visit
Admit: 2023-09-12 | Discharge: 2023-09-12 | Payer: MEDICARE | Attending: Pharmacist Clinician (PhC)/ Clinical Pharmacy Specialist | Primary: Pharmacist Clinician (PhC)/ Clinical Pharmacy Specialist

## 2023-09-12 ENCOUNTER — Ambulatory Visit: Admit: 2023-09-12 | Discharge: 2023-09-12 | Payer: MEDICARE | Attending: Family | Primary: Family

## 2023-09-12 ENCOUNTER — Ambulatory Visit
Admit: 2023-09-12 | Discharge: 2023-09-16 | Disposition: A | Discharge status: Home Health | Payer: MEDICARE | Admitting: Internal Medicine

## 2023-09-12 ENCOUNTER — Encounter
Admit: 2023-09-12 | Discharge: 2023-09-12 | Payer: MEDICARE | Attending: Student in an Organized Health Care Education/Training Program | Primary: Student in an Organized Health Care Education/Training Program

## 2023-09-12 ENCOUNTER — Other Ambulatory Visit: Admit: 2023-09-12 | Discharge: 2023-09-12 | Payer: MEDICARE

## 2023-09-12 DIAGNOSIS — Z9481 Bone marrow transplant status: Principal | ICD-10-CM

## 2023-09-12 DIAGNOSIS — C9201 Acute myeloblastic leukemia, in remission: Principal | ICD-10-CM

## 2023-09-12 DIAGNOSIS — C9200 Acute myeloblastic leukemia, not having achieved remission: Principal | ICD-10-CM

## 2023-09-12 LAB — CBC W/ AUTO DIFF
BASOPHILS ABSOLUTE COUNT: 0 10*9/L (ref 0.0–0.1)
BASOPHILS ABSOLUTE COUNT: 0 10*9/L (ref 0.0–0.1)
BASOPHILS RELATIVE PERCENT: 0.2 %
BASOPHILS RELATIVE PERCENT: 0.2 %
EOSINOPHILS ABSOLUTE COUNT: 0 10*9/L (ref 0.0–0.5)
EOSINOPHILS ABSOLUTE COUNT: 0 10*9/L (ref 0.0–0.5)
EOSINOPHILS RELATIVE PERCENT: 0 %
EOSINOPHILS RELATIVE PERCENT: 0 %
HEMATOCRIT: 23.2 % — ABNORMAL LOW (ref 39.0–48.0)
HEMATOCRIT: 25.9 % — ABNORMAL LOW (ref 39.0–48.0)
HEMOGLOBIN: 8.1 g/dL — ABNORMAL LOW (ref 12.9–16.5)
HEMOGLOBIN: 9.1 g/dL — ABNORMAL LOW (ref 12.9–16.5)
LYMPHOCYTES ABSOLUTE COUNT: 0.1 10*9/L — ABNORMAL LOW (ref 1.1–3.6)
LYMPHOCYTES ABSOLUTE COUNT: 0.1 10*9/L — ABNORMAL LOW (ref 1.1–3.6)
LYMPHOCYTES RELATIVE PERCENT: 1 %
LYMPHOCYTES RELATIVE PERCENT: 1 %
MEAN CORPUSCULAR HEMOGLOBIN CONC: 35 g/dL (ref 32.0–36.0)
MEAN CORPUSCULAR HEMOGLOBIN CONC: 35.4 g/dL (ref 32.0–36.0)
MEAN CORPUSCULAR HEMOGLOBIN: 33.9 pg — ABNORMAL HIGH (ref 25.9–32.4)
MEAN CORPUSCULAR HEMOGLOBIN: 34.7 pg — ABNORMAL HIGH (ref 25.9–32.4)
MEAN CORPUSCULAR VOLUME: 96.8 fL — ABNORMAL HIGH (ref 77.6–95.7)
MEAN CORPUSCULAR VOLUME: 98.1 fL — ABNORMAL HIGH (ref 77.6–95.7)
MEAN PLATELET VOLUME: 9.2 fL (ref 6.8–10.7)
MEAN PLATELET VOLUME: 9.5 fL (ref 6.8–10.7)
MONOCYTES ABSOLUTE COUNT: 1.5 10*9/L — ABNORMAL HIGH (ref 0.3–0.8)
MONOCYTES ABSOLUTE COUNT: 2.1 10*9/L — ABNORMAL HIGH (ref 0.3–0.8)
MONOCYTES RELATIVE PERCENT: 29.4 %
MONOCYTES RELATIVE PERCENT: 31.1 %
NEUTROPHILS ABSOLUTE COUNT: 3.7 10*9/L (ref 1.8–7.8)
NEUTROPHILS ABSOLUTE COUNT: 4.5 10*9/L (ref 1.8–7.8)
NEUTROPHILS RELATIVE PERCENT: 69.4 %
NEUTROPHILS RELATIVE PERCENT: 67.7 %
PLATELET COUNT: 102 10*9/L — ABNORMAL LOW (ref 150–450)
PLATELET COUNT: 114 10*9/L — ABNORMAL LOW (ref 150–450)
RED BLOOD CELL COUNT: 2.39 10*12/L — ABNORMAL LOW (ref 4.26–5.60)
RED BLOOD CELL COUNT: 2.64 10*12/L — ABNORMAL LOW (ref 4.26–5.60)
RED CELL DISTRIBUTION WIDTH: 13.6 % (ref 12.2–15.2)
RED CELL DISTRIBUTION WIDTH: 13.7 % (ref 12.2–15.2)
WBC ADJUSTED: 5.3 10*9/L (ref 3.6–11.2)
WBC ADJUSTED: 6.6 10*9/L (ref 3.6–11.2)

## 2023-09-12 LAB — COMPREHENSIVE METABOLIC PANEL
ALBUMIN: 3.1 g/dL — ABNORMAL LOW (ref 3.4–5.0)
ALBUMIN: 3.3 g/dL — ABNORMAL LOW (ref 3.4–5.0)
ALKALINE PHOSPHATASE: 99 U/L (ref 46–116)
ALKALINE PHOSPHATASE: 118 U/L — ABNORMAL HIGH (ref 46–116)
ALT (SGPT): 27 U/L (ref 10–49)
ALT (SGPT): 31 U/L (ref 10–49)
ANION GAP: 7 mmol/L (ref 5–14)
ANION GAP: 11 mmol/L (ref 5–14)
AST (SGOT): 27 U/L (ref <=34)
BILIRUBIN TOTAL: 0.4 mg/dL (ref 0.3–1.2)
BILIRUBIN TOTAL: 0.4 mg/dL (ref 0.3–1.2)
BLOOD UREA NITROGEN: 18 mg/dL (ref 9–23)
BLOOD UREA NITROGEN: 18 mg/dL (ref 9–23)
BUN / CREAT RATIO: 14
BUN / CREAT RATIO: 14
CALCIUM: 8.6 mg/dL — ABNORMAL LOW (ref 8.7–10.4)
CALCIUM: 8.8 mg/dL (ref 8.7–10.4)
CHLORIDE: 106 mmol/L (ref 98–107)
CHLORIDE: 104 mmol/L (ref 98–107)
CO2: 23 mmol/L (ref 20.0–31.0)
CO2: 21 mmol/L (ref 20.0–31.0)
CREATININE: 1.26 mg/dL — ABNORMAL HIGH
CREATININE: 1.29 mg/dL — ABNORMAL HIGH
EGFR CKD-EPI (2021) MALE: 59 mL/min/{1.73_m2} — ABNORMAL LOW (ref >=60)
EGFR CKD-EPI (2021) MALE: 57 mL/min/{1.73_m2} — ABNORMAL LOW (ref >=60)
GLUCOSE RANDOM: 194 mg/dL — ABNORMAL HIGH (ref 70–179)
GLUCOSE RANDOM: 212 mg/dL — ABNORMAL HIGH (ref 70–179)
POTASSIUM: 3.4 mmol/L (ref 3.4–4.8)
PROTEIN TOTAL: 5.9 g/dL (ref 5.7–8.2)
PROTEIN TOTAL: 6.3 g/dL (ref 5.7–8.2)
SODIUM: 136 mmol/L (ref 135–145)
SODIUM: 136 mmol/L (ref 135–145)

## 2023-09-12 LAB — MAGNESIUM
MAGNESIUM: 1.7 mg/dL (ref 1.6–2.6)
MAGNESIUM: 1.8 mg/dL (ref 1.6–2.6)
MAGNESIUM: 1.9 mg/dL (ref 1.6–2.6)

## 2023-09-12 LAB — POTASSIUM: POTASSIUM: 4 mmol/L (ref 3.4–4.8)

## 2023-09-12 LAB — SLIDE REVIEW

## 2023-09-12 LAB — LACTATE DEHYDROGENASE: LACTATE DEHYDROGENASE: 232 U/L (ref 120–246)

## 2023-09-12 LAB — URIC ACID: URIC ACID: 3.5 mg/dL — ABNORMAL LOW

## 2023-09-12 LAB — PHOSPHORUS: PHOSPHORUS: 4.2 mg/dL (ref 2.4–5.1)

## 2023-09-12 LAB — AST: AST (SGOT): 30 U/L (ref <=34)

## 2023-09-12 LAB — HIGH SENSITIVITY TROPONIN I - SINGLE: HIGH SENSITIVITY TROPONIN I: 10 ng/L (ref <=53)

## 2023-09-12 MED ADMIN — sodium chloride 0.9% (NS) bolus 1,000 mL: 1000 mL | INTRAVENOUS | @ 20:00:00 | Stop: 2023-09-12

## 2023-09-12 MED ADMIN — acetaminophen (TYLENOL) tablet 1,000 mg: 1000 mg | ORAL | @ 22:00:00 | Stop: 2023-09-12

## 2023-09-12 MED ADMIN — loperamide (IMODIUM) capsule 2 mg: 2 mg | ORAL | @ 22:00:00 | Stop: 2023-09-12

## 2023-09-12 NOTE — Unmapped (Signed)
Pt here from bone marrow clinic for his follow up after being discharged from BMTU this past Monday. Pt had syncopal ep for . Decreased appetite

## 2023-09-12 NOTE — Unmapped (Signed)
Pt brought down from bone marrow clinic. Had syncopal episode in clinic - lasting around 2 mins. A&O x 4 now. Stem cell transplant last week - discharged on Monday.

## 2023-09-12 NOTE — Unmapped (Signed)
Pt was here to be seen in clinic for follow up. I assisted pt with standing in obtaining his weight. Pt very unsteady but was able to safely sit back down on wheel chair. Once pt sat down he was nonverbal and did not follow any commands given. Pt was breathing rapidly. The episode lasted about 2 minutes and then patient slowly came to. Pt does not have any recollection of what happened. Vitals are stable. Provider notified and pt was sent to the ED.

## 2023-09-12 NOTE — Unmapped (Signed)
[facility]  Emergency Department Provider Note       ED Clinical Impression     Final diagnoses:   Syncope, unspecified syncope type (Primary)        History     Chief Complaint  Chief Complaint   Patient presents with    Syncope       HPI   Timothy Porter is a 77 y.o. male with a history as below who is presenting with syncopal episode.  Patient was in BMT clinic for first follow-up after being discharged from [facility] on Monday.  Patient stood up to get weighed, upon sitting back down and having legs adjusted in a wheelchair, was noted to have decreased responsiveness for 2 minutes.  Did not hit his head, no abnormal movements noted during this episode.  No history of seizures.  Has remote history of A-fib in the setting of stem cell transplant, but this has resolved and patient has not been on antiarrhythmics.  Patient has had poor p.o. intake for multiple weeks.  Denies chest pain, abdominal pain, fever/chills.  Does report feeling dizzy when standing up and ambulating over the past week, but no episodes to this extent.       Impression, Medical Decision Making, ED Course     Impression: This is a 77 y.o. male with a history of multiple myeloma s/p stem cell transplant, AML who presents with syncope. Upon initial evaluation in the emergency department, the patient was non-toxic appearing with vitals as below.    BP 102/63  - Pulse 92  - Temp 36.9 ??C (98.4 ??F) (Oral)  - Resp 17  - SpO2 94%     Diagnostic workup as below.  Highest suspicion for syncope in the setting of decreased p.o. intake.  Initial blood pressure soft however improved with recheck.  Tachycardic to 110s.  Differential also includes arrhythmia, seizure, TIA.      Orders Placed This Encounter   Procedures    Blood Culture #1    Blood Culture #2    ED POCUS    CTA Chest W Contrast    CT head WO contrast    CBC w/ Differential    Comprehensive Metabolic Panel    Magnesium    Phosphorus    hsTroponin I (single, no delta)    Potassium Level    AST Urinalysis with Microscopy with Culture Reflex    Vital signs    Cardiac Monitoring    Remote Tech Telemetry Monitoring    Inpatient Consult to Oncology    ECG 12 Lead    Insert peripheral IV    ED Admit Decision       ED Course as of 09/12/23 1750   Wed Sep 12, 2023   1458 On initial assessment patient is well-appearing, wife at bedside.  No focal deficits on neuroexam.  Denies current symptoms, attributes improvement to being supine.   1640 POCUS with small pericardial effusion.   16 Spoke with BMT team.  Patient will be admitted for further syncopal workup.  MAO paged       Discussed with oncology and BMT teams, plan for admission for further workup of syncopal episode.    ____________________________________________    The case was discussed with Dr. Berkley Harvey, MD, who is in agreement with the above assessment and plan.    Dictation software was used while making this note. Please excuse any errors made with dictation software.     Additional Medical Decision Making  I have reviewed the vital signs and the nursing notes. Labs and radiology results that were available during my care of the patient were independently reviewed by me and considered in my medical decision making.     I independently visualized the EKG tracing if performed.  I independently visualized the radiology images if performed.  I reviewed the patient's prior medical records if available.  Additional history obtained from family if available.  I discussed the case with the admitting provider and the consulting services if the patient was admitted and/or consulting services were utilized.     Physical Exam     VITAL SIGNS:      Vitals:    09/12/23 1549 09/12/23 1600 09/12/23 1658 09/12/23 1700   BP:  133/71 115/60 102/63   Pulse: 96 97  92   Resp: 14 17 22 17    Temp:   36.9 ??C (98.4 ??F)    TempSrc:   Oral    SpO2: 99% 96% 96% 94%       Physical Exam   Constitutional: No distress. He appears chronically ill.   HENT:   Nose: Nose normal. Mouth/Throat: Dentition is normal. Oropharynx is clear.   Eyes: Pupils are equal, round, and reactive to light. Conjunctivae are normal.   Cardiovascular: Regular rhythm. Tachycardia present.   Pulmonary/Chest: Effort normal and breath sounds normal.   Abdominal: Soft. Bowel sounds are normal. There is no abdominal tenderness.   Musculoskeletal:      Cervical back: Normal range of motion.   Neurological: He is alert and oriented to person, place, and time. He has normal motor skills and intact cranial nerves (2-12).   Skin: Skin is warm and dry.        History     Chief Complaint  Chief Complaint   Patient presents with    Syncope       HPI   See above     All other systems have been reviewed and are negative except as otherwise documented.    Past Medical History:   Diagnosis Date    Benign prostatic hyperplasia with lower urinary tract symptoms 11/27/2018    BPH (benign prostatic hyperplasia)     Diarrhea     Fatigue     Fractures     left arm    Glucose intolerance     May 2019: HbA1c 6.2.    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 CMV negative 04/07/20.     Pancytopenia (CMS-HCC) 03/14/2023    Prediabetes        Past Surgical History:   Procedure Laterality Date    FRACTURE SURGERY      HERNIA REPAIR      HIP SURGERY      JOINT REPLACEMENT      PR COLONOSCOPY W/BIOPSY SINGLE/MULTIPLE N/A 01/01/2018    Procedure: COLONOSCOPY, FLEXIBLE, PROXIMAL TO SPLENIC FLEXURE; WITH BIOPSY, SINGLE OR MULTIPLE;  Surgeon: Bronson Curb, MD;  Location: [facility];  Service: Gastroenterology    PR IMPLANT MESH HERNIA REPAIR/DEBRIDEMENT CLOSURE  05/07/2017    Procedure: implantation of mesh;  Surgeon: Judithann Graves, MD;  Location: MAIN OR [facility];  Service: Trauma    PR REPAIR ING HERNIA,5+Y/O,REDUCIBL Left 05/07/2017    Procedure: Repair direct and indirect inguinal hernia with mesh;  Surgeon: Judithann Graves, MD;  Location: MAIN OR [facility];  Service: Trauma    PR TOTAL HIP ARTHROPLASTY Right 10/14/2015    Procedure: ARTHROPLASTY, ACETABULAR & PROXIMAL FEMORAL PROSTHETIC REPLACEMENT (TOTAL HIP), W/WO AUTOGRAFT/ALLOGRAFT;  Surgeon: Malka So, MD;  Location: [facility];  Service: Orthopedics    VARICOSE VEIN SURGERY Right          Current Facility-Administered Medications:     loperamide (IMODIUM) capsule 2 mg, 2 mg, Oral, Once, Berlin Hun, MD    Current Outpatient Medications:     amlodipine (NORVASC) 10 MG tablet, Take 1 tablet (10 mg total) by mouth daily., Disp: 30 tablet, Rfl: 1    calcium carbonate (TUMS) 200 mg calcium (500 mg) chewable tablet, Chew 2 tablets (400 mg of elem calcium total) Three (3) times a day., Disp: , Rfl:     cholecalciferol, vitamin D3 25 mcg, 1,000 units,, 1,000 unit (25 mcg) tablet, Take 1 tablet (25 mcg total) by mouth., Disp: , Rfl:     cyanocobalamin, vitamin B-12, 2,000 mcg Tab, Take 2,000 mcg by mouth in the morning., Disp: 90 tablet, Rfl: 3    fluconazole (DIFLUCAN) 200 MG tablet, Take 2 tablets (400 mg total) by mouth daily., Disp: 60 tablet, Rfl: 1    letermovir (PREVYMIS) 480 mg tablet, Take 1 tablet (480 mg total) by mouth daily., Disp: 28 tablet, Rfl: 6    loperamide (IMODIUM) 2 mg capsule, Take 2 capsules (4 mg total) by mouth four (4) times a day as needed for diarrhea., Disp: 30 capsule, Rfl: 0    loratadine (CLARITIN) 10 mg tablet, Take 1 tablet (10 mg total) by mouth daily., Disp: , Rfl:     magnesium sulfate in 0.9 %NaCl 2 gram/50 mL PgBk, Infuse 2 g into a venous catheter daily., Disp: 700 mL, Rfl: 0    metoPROLOL succinate (TOPROL-XL) 50 MG 24 hr tablet, Take 1 tablet (50 mg total) by mouth daily., Disp: 30 tablet, Rfl: 11    multivitamin with minerals tablet, Take 1 tablet by mouth daily., Disp: , Rfl:     mycophenolate (CELLCEPT) 500 mg tablet, Take 2 tablets (1,000 mg total) by mouth Three (3) times a day., Disp: 98 tablet, Rfl: 0    omeprazole (PRILOSEC) 40 MG capsule, TAKE 1 CAPSULE(40 MG) BY MOUTH DAILY, Disp: 90 capsule, Rfl: 0    ondansetron (ZOFRAN) 8 MG tablet, Take 1 tablet (8 mg total) by mouth every eight (8) hours as needed for nausea., Disp: 30 tablet, Rfl: 2    potassium chloride 20 MEQ ER tablet, Take 2 tablets (40 mEq total) by mouth daily., Disp: 20 tablet, Rfl: 0    sulfamethoxazole-trimethoprim (BACTRIM DS) 800-160 mg per tablet, Take 1 tablet (160 mg of trimethoprim total) by mouth every Monday, Wednesday, and Friday., Disp: 12 tablet, Rfl: 5    tacrolimus (PROGRAF) 1 MG capsule, Take 5 capsules (5 mg total) by mouth two (2) times a day., Disp: 300 capsule, Rfl: 5    tamsulosin (FLOMAX) 0.4 mg capsule, Take 1 capsule (0.4 mg total) by mouth nightly., Disp: 30 capsule, Rfl: 1    valACYclovir (VALTREX) 500 MG tablet, Take 1 tablet (500 mg total) by mouth two (2) times a day., Disp: 60 tablet, Rfl: 11  Facility-Administered Medications Ordered in Other Encounters:     DTaP-hepatitis B recombinant-IPV (PEDIARIX) 10 mcg-25Lf-25 mcg-10Lf/0.5 mL injection, , , ,     DTaP-hepatitis B recombinant-IPV (PEDIARIX) 10 mcg-25Lf-25 mcg-10Lf/0.5 mL injection, , , ,     haemophilus B polysac-tetanus toxoid (ActHIB) 10 mcg/0.5 mL injection, , , ,     influenza vaccine quad (FLUARIX, FLULAVAL, FLUZONE) (6 MOS & UP) 2020-21 ADS Med, , , ,     pneumococcal conjugate (13-valent) (PREVNAR-13) 0.5 mL vaccine, , , ,     pneumococcal conjugate (13-valent) (PREVNAR-13) 0.5 mL vaccine, , , ,     varicella-zoster gE-AS01B (PF) (SHINGRIX) 50 mcg/0.5 mL injection, , , ,     Allergies  Patient has no known allergies.    Family History   Problem Relation Age of Onset    Dementia Father     No Known Problems Other        Social History  Social History     Tobacco Use    Smoking status: Never    Smokeless tobacco: Never   Vaping Use    Vaping status: Never Used   Substance Use Topics    Alcohol use: Yes     Comment: 1 drink a month    Drug use: No        Radiology     ED POCUS   Final Result      CTA Chest W Contrast    (Results Pending)   CT head WO contrast    (Results Pending)        Laboratory Data     Lab Results   Component Value Date    WBC 6.6 09/12/2023    HGB 9.1 (L) 09/12/2023    HCT 25.9 (L) 09/12/2023    PLT 114 (L) 09/12/2023       Lab Results   Component Value Date    NA 136 09/12/2023    K 4.0 09/12/2023    CL 104 09/12/2023    CO2 21.0 09/12/2023    BUN 18 09/12/2023    CREATININE 1.29 (H) 09/12/2023    GLU 212 (H) 09/12/2023    CALCIUM 8.8 09/12/2023    MG 1.9 09/12/2023    PHOS 4.2 09/12/2023       Lab Results   Component Value Date    BILITOT 0.4 09/12/2023    BILIDIR 0.30 09/09/2023    PROT 6.3 09/12/2023    ALBUMIN 3.3 (L) 09/12/2023    ALT 31 09/12/2023    AST 30 09/12/2023    ALKPHOS 118 (H) 09/12/2023    GGT 64 06/28/2019       Lab Results   Component Value Date    INR 1.18 09/09/2023    APTT 28.9 09/09/2023       Pertinent labs & imaging results that were available during my care of the patient were reviewed by me and considered in my medical decision making (see chart for details).    Portions of this record have been created using Scientist, clinical (histocompatibility and immunogenetics). Dictation errors have been sought, but may not have been identified and corrected.       Berlin Hun, MD  Resident  09/12/23 [phone]

## 2023-09-12 NOTE — Unmapped (Unsigned)
BMT Clinic Follow-up    Patient Name: Timothy Porter  MRN: [id number]  Encounter date: 09/12/2023    [facility] Myeloma Physician: Dr Vivien Presto Leukemia Physician: Dr. [name]  BMT Attending MD: [name]  Transplant Day: 1566 (Autologous PBSC Unknown Phase - Planned 05/29/2019), 20 (Allogeneic PBSC Unknown Phase - Planned 9/25/2024[facility] Course:  Timothy Porter is a 77 y.o. 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 His transplant course was complicated by diarrhea (hx of IBS with alternating diarrhea/constipation; C diff neg) and fevers (culture neg, treated with Cefepime). Diarrhea required scheduled Lomotil and Imodium initially, but now controlled with PRN Imodium and patient self-titrating given IBS hx. He is incontinent at times. He also had hypomagnesemia requiring daily IV Mag and discharging with 2g IV Mag and hypokalemia discharging with 40k daily. BP medications were adjusted while admitted as he was HTN then developed orthostatic hypotension. He was discharged on 10mg  daily.      Interval History:   Timothy Porter presented to the BMT clinic today for his first follow up post discharge. He had a brief loss of consciousness after standing to get weighed. Episode witnessed by two staff members. He was tachypneic and unresponsive to painful stimuli for 1-2 minutes. I was asked to assess him shortly after this event and he was dyspneic but arousable. He has no recollection of the event. Vital signs obtained and he is tachycardic and hypotensive, he does have a history of A.Fib. He was escorted to the ED by two nurses for workup of brief LOC.          09/12/23 1350   BP: 103/83   Pulse: 122   Resp: 16   Temp: 35.9 ??C (96.7 ??F)   Weight: (!) 119 kg (262 lb 5.6 oz) Assessment/ Plan:  Patient to go to the ED and if admitted will go to MED-T, if floor appropriate. Differentials include but not limited to postural syncope, sepsis, stroke and seizure.     Karenann Cai, FNP-C  [facility]   09/12/2023  2:25 PM starting day +5  3. Mycophenolate mofetil 15 mg/kg PO TID (max 1g PO TID) starting day +5 through day +35      * Tac Goal trough: 5-10     * High risk drug monitoring and dose adjustments per BMT Pharmacist    * Signs/symptoms of toxicity: None      5) Anemia/Thrombocytopenia/Neutropenia     6) Hx of IBS with chronic alternating diarrhea/constipation  -Has managed with changing doses of Questran; increased to 1 packet BID in 02/2023 with improvement of diarrhea; self tapered to 0.5 packets BID  -Reports predominantly constipation concerns but would have occasional accidents; says he hasn't soiled himself in months with current dose  -Uses docusate only if no BM 3-4 days  -Meticulously self tracks and titrates medication  -10/14: Having 3-4 loose stools a day, using imodium 4mg  QID PRN. Diarrhea is a long standing issue for him, incontinence is not uncommon will need to assess for changes to BM pattern in clinic     7) FEN:    Home meds: Vitamin D3 25 mcg daily, B12 2000 mcg daily, MV  Hypomagnesemia: 2g IV Mag daily with [facility]  Hypokalemia: 40k daily  Hypocalcemia: Calcium carbonate 2 tabs TID    8) Hx of A-fib with transplant in 2020; required diltiazem+metoprolol; resolved post acute episode     * Continues on metoprolol mainly for BP.      * Plan for cardiac electrolytes     9) HTN: has discontinued diltiazem prior to the start of venetoclax (for DDI) currently managed with metoprolol monotherapy; decreased to 50 mg/day ~1-2 months ago  -9/20: Adding norvasc 5mg  daily for persistent HTN  -10/12: Increased Amlodipine to 10mg  daily      10) Chronic cough when supine from PND? 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 Order lipids if on Siro, Ruxolitinib, or has cardiac history. Order ECHO if prior cardiac history.

## 2023-09-13 LAB — CBC W/ AUTO DIFF
BASOPHILS ABSOLUTE COUNT: 0 10*9/L (ref 0.0–0.1)
BASOPHILS ABSOLUTE COUNT: 0 10*9/L (ref 0.0–0.1)
BASOPHILS RELATIVE PERCENT: 0.1 %
BASOPHILS RELATIVE PERCENT: 0.3 %
EOSINOPHILS ABSOLUTE COUNT: 0 10*9/L (ref 0.0–0.5)
EOSINOPHILS ABSOLUTE COUNT: 0 10*9/L (ref 0.0–0.5)
EOSINOPHILS RELATIVE PERCENT: 0 %
EOSINOPHILS RELATIVE PERCENT: 0 %
HEMATOCRIT: 20.7 % — ABNORMAL LOW (ref 39.0–48.0)
HEMATOCRIT: 20.9 % — ABNORMAL LOW (ref 39.0–48.0)
HEMOGLOBIN: 7.5 g/dL — ABNORMAL LOW (ref 12.9–16.5)
HEMOGLOBIN: 7.5 g/dL — ABNORMAL LOW (ref 12.9–16.5)
LYMPHOCYTES ABSOLUTE COUNT: 0.1 10*9/L — ABNORMAL LOW (ref 1.1–3.6)
LYMPHOCYTES ABSOLUTE COUNT: 0.1 10*9/L — ABNORMAL LOW (ref 1.1–3.6)
LYMPHOCYTES RELATIVE PERCENT: 1.2 %
LYMPHOCYTES RELATIVE PERCENT: 1.8 %
MEAN CORPUSCULAR HEMOGLOBIN CONC: 36 g/dL (ref 32.0–36.0)
MEAN CORPUSCULAR HEMOGLOBIN CONC: 35.8 g/dL (ref 32.0–36.0)
MEAN CORPUSCULAR HEMOGLOBIN: 34.6 pg — ABNORMAL HIGH (ref 25.9–32.4)
MEAN CORPUSCULAR HEMOGLOBIN: 34.6 pg — ABNORMAL HIGH (ref 25.9–32.4)
MEAN CORPUSCULAR VOLUME: 96.2 fL — ABNORMAL HIGH (ref 77.6–95.7)
MEAN CORPUSCULAR VOLUME: 96.7 fL — ABNORMAL HIGH (ref 77.6–95.7)
MEAN PLATELET VOLUME: 9.1 fL (ref 6.8–10.7)
MEAN PLATELET VOLUME: 8.3 fL (ref 6.8–10.7)
MONOCYTES ABSOLUTE COUNT: 1.5 10*9/L — ABNORMAL HIGH (ref 0.3–0.8)
MONOCYTES ABSOLUTE COUNT: 1.3 10*9/L — ABNORMAL HIGH (ref 0.3–0.8)
MONOCYTES RELATIVE PERCENT: 34.8 %
MONOCYTES RELATIVE PERCENT: 31 %
NEUTROPHILS ABSOLUTE COUNT: 2.7 10*9/L (ref 1.8–7.8)
NEUTROPHILS ABSOLUTE COUNT: 2.8 10*9/L (ref 1.8–7.8)
NEUTROPHILS RELATIVE PERCENT: 63.9 %
NEUTROPHILS RELATIVE PERCENT: 66.9 %
PLATELET COUNT: 90 10*9/L — ABNORMAL LOW (ref 150–450)
PLATELET COUNT: 102 10*9/L — ABNORMAL LOW (ref 150–450)
RED BLOOD CELL COUNT: 2.16 10*12/L — ABNORMAL LOW (ref 4.26–5.60)
RED BLOOD CELL COUNT: 2.16 10*12/L — ABNORMAL LOW (ref 4.26–5.60)
RED CELL DISTRIBUTION WIDTH: 13.9 % (ref 12.2–15.2)
RED CELL DISTRIBUTION WIDTH: 13.8 % (ref 12.2–15.2)
WBC ADJUSTED: 4.3 10*9/L (ref 3.6–11.2)
WBC ADJUSTED: 4.1 10*9/L (ref 3.6–11.2)

## 2023-09-13 LAB — BASIC METABOLIC PANEL
ANION GAP: 7 mmol/L (ref 5–14)
BLOOD UREA NITROGEN: 18 mg/dL (ref 9–23)
BUN / CREAT RATIO: 17
CALCIUM: 8.2 mg/dL — ABNORMAL LOW (ref 8.7–10.4)
CHLORIDE: 108 mmol/L — ABNORMAL HIGH (ref 98–107)
CO2: 22 mmol/L (ref 20.0–31.0)
CREATININE: 1.06 mg/dL
EGFR CKD-EPI (2021) MALE: 73 mL/min/{1.73_m2} (ref >=60)
GLUCOSE RANDOM: 105 mg/dL (ref 70–179)
POTASSIUM: 3.6 mmol/L (ref 3.4–4.8)
SODIUM: 137 mmol/L (ref 135–145)

## 2023-09-13 LAB — URINALYSIS WITH MICROSCOPY WITH CULTURE REFLEX PERFORMABLE
BACTERIA: NONE SEEN /HPF
BILIRUBIN UA: NEGATIVE
BLOOD UA: NEGATIVE
GLUCOSE UA: NEGATIVE
HYALINE CASTS: 19 /LPF — ABNORMAL HIGH (ref 0–1)
KETONES UA: NEGATIVE
LEUKOCYTE ESTERASE UA: NEGATIVE
NITRITE UA: NEGATIVE
PH UA: 5.5 (ref 5.0–9.0)
RBC UA: 2 /HPF (ref <=3)
SPECIFIC GRAVITY UA: 1.019 (ref 1.003–1.030)
SQUAMOUS EPITHELIAL: <1 /HPF (ref 0–5)
UROBILINOGEN UA: <2
WBC UA: 5 /HPF — ABNORMAL HIGH (ref <=2)

## 2023-09-13 LAB — PROTIME-INR
INR: 1.13
PROTIME: 12.6 s (ref 9.9–12.6)

## 2023-09-13 LAB — APTT
APTT: 26.6 s (ref 24.8–38.4)
HEPARIN CORRELATION: 0.2

## 2023-09-13 LAB — HEPATIC FUNCTION PANEL
ALBUMIN: 2.8 g/dL — ABNORMAL LOW (ref 3.4–5.0)
ALKALINE PHOSPHATASE: 95 U/L (ref 46–116)
ALT (SGPT): 26 U/L (ref 10–49)
AST (SGOT): 24 U/L (ref <=34)
BILIRUBIN DIRECT: 0.2 mg/dL (ref 0.00–0.30)
BILIRUBIN TOTAL: 0.3 mg/dL (ref 0.3–1.2)
PROTEIN TOTAL: 5.3 g/dL — ABNORMAL LOW (ref 5.7–8.2)

## 2023-09-13 LAB — URIC ACID: URIC ACID: 3.4 mg/dL — ABNORMAL LOW

## 2023-09-13 LAB — TACROLIMUS LEVEL, TROUGH: TACROLIMUS, TROUGH: 6.3 ng/mL (ref 5.0–15.0)

## 2023-09-13 LAB — TACROLIMUS LEVEL, TIMED: TACROLIMUS BLOOD: 10.1 ng/mL

## 2023-09-13 LAB — LACTATE DEHYDROGENASE: LACTATE DEHYDROGENASE: 202 U/L (ref 120–246)

## 2023-09-13 LAB — PHOSPHORUS: PHOSPHORUS: 3.7 mg/dL (ref 2.4–5.1)

## 2023-09-13 LAB — IONIZED CALCIUM VENOUS: CALCIUM IONIZED VENOUS (MG/DL): 4.47 mg/dL (ref 4.40–5.40)

## 2023-09-13 LAB — MAGNESIUM: MAGNESIUM: 1.6 mg/dL (ref 1.6–2.6)

## 2023-09-13 MED ADMIN — potassium chloride ER tablet 40 mEq: 40 meq | ORAL | @ 09:00:00

## 2023-09-13 MED ADMIN — cyanocobalamin (vitamin B-12) tablet 2,000 mcg: 2000 ug | ORAL | @ 14:00:00

## 2023-09-13 MED ADMIN — calcium carbonate (TUMS) chewable tablet 400 mg elem calcium: 400 mg | ORAL | @ 18:00:00

## 2023-09-13 MED ADMIN — calcium carbonate (TUMS) chewable tablet 400 mg elem calcium: 400 mg | ORAL | @ 14:00:00

## 2023-09-13 MED ADMIN — mycophenolate (CELLCEPT) tablet 1,000 mg: 1000 mg | ORAL | @ 14:00:00

## 2023-09-13 MED ADMIN — letermovir (PREVYMIS) tablet 480 mg: 480 mg | ORAL | @ 14:00:00

## 2023-09-13 MED ADMIN — sodium chloride 0.9% (NS) bolus 1,000 mL: 1000 mL | INTRAVENOUS | @ 15:00:00 | Stop: 2023-09-13

## 2023-09-13 MED ADMIN — tamsulosin (FLOMAX) 24 hr capsule 0.4 mg: .4 mg | ORAL | @ 04:00:00

## 2023-09-13 MED ADMIN — loratadine (CLARITIN) tablet 10 mg: 10 mg | ORAL | @ 14:00:00

## 2023-09-13 MED ADMIN — fluconazole (DIFLUCAN) tablet 400 mg: 400 mg | ORAL | @ 14:00:00 | Stop: 2023-10-03

## 2023-09-13 MED ADMIN — loperamide (IMODIUM) capsule 4 mg: 4 mg | ORAL | @ 04:00:00

## 2023-09-13 MED ADMIN — prochlorperazine (COMPAZINE) injection 10 mg: 10 mg | INTRAVENOUS | @ 15:00:00

## 2023-09-13 MED ADMIN — pantoprazole (Protonix) EC tablet 40 mg: 40 mg | ORAL | @ 14:00:00

## 2023-09-13 MED ADMIN — tacrolimus (PROGRAF) capsule 5 mg: 5 mg | ORAL | @ 14:00:00 | Stop: 2023-09-13

## 2023-09-13 MED ADMIN — loperamide (IMODIUM) capsule 4 mg: 4 mg | ORAL | @ 22:00:00

## 2023-09-13 MED ADMIN — loperamide (IMODIUM) capsule 4 mg: 4 mg | ORAL | @ 09:00:00

## 2023-09-13 MED ADMIN — valACYclovir (VALTREX) tablet 500 mg: 500 mg | ORAL | @ 14:00:00

## 2023-09-13 MED ADMIN — iohexol (OMNIPAQUE) 350 mg iodine/mL solution 75 mL: 75 mL | INTRAVENOUS | @ 02:00:00 | Stop: 2023-09-12

## 2023-09-13 MED ADMIN — valACYclovir (VALTREX) tablet 500 mg: 500 mg | ORAL | @ 04:00:00

## 2023-09-13 MED ADMIN — metoPROLOL succinate (Toprol-XL) 24 hr tablet 50 mg: 50 mg | ORAL | @ 14:00:00

## 2023-09-13 MED ADMIN — cholecalciferol (vitamin D3 25 mcg (1,000 units)) tablet 25 mcg: 25 ug | ORAL | @ 14:00:00

## 2023-09-13 MED ADMIN — zinc sulfate (ZINCATE) capsule 220 mg: 220 mg | ORAL | @ 15:00:00 | Stop: 2023-09-23

## 2023-09-13 MED ADMIN — mycophenolate (CELLCEPT) tablet 1,000 mg: 1000 mg | ORAL | @ 18:00:00

## 2023-09-13 MED ADMIN — mycophenolate (CELLCEPT) tablet 1,000 mg: 1000 mg | ORAL | @ 04:00:00

## 2023-09-13 NOTE — Unmapped (Signed)
Tacrolimus Therapeutic Monitoring Pharmacy Note    Timothy Porter is a 77 y.o. male continuing tacrolimus.     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     Date of Transplant:  auto-SCT in 05/2019       Prior Dosing Information: Home regimen tacrolimus 5 mg BID       Source(s) of information used to determine prior to admission dosing: Clinic Note    Goals:  Therapeutic Drug Levels  Tacrolimus trough goal:  5-10  ng/mL     Additional Clinical Monitoring/Outcomes  Monitor renal function (SCr and urine output) and liver function (LFTs)  Monitor for signs/symptoms of adverse events (e.g., hyperglycemia, hyperkalemia, hypomagnesemia, hypertension, headache, tremor)    Results:   Tacrolimus level:   - 10/16 @ 1329 in clinic: 10.1 ng/mL  - 10/17 @ 0933: 6.3 ng/mL    Pharmacokinetic Considerations and Significant Drug Interactions:  Concurrent hepatotoxic medications:  fluconazole  Concurrent CYP3A4 substrates/inhibitors:  fluconazole, letermovir  Concurrent nephrotoxic medications: None identified    Assessment/Plan:  Recommendedation(s)  Last tacrolimus dose taken at home 10/15 PM, with 10/16 clinic level supratherapeutic (~16 hours post dose) and 10/17 level therapeutic (~36 hours post dose)  Scr above baseline the past few days but improved today  LFTs WNL  Posaconazole and letermovir at steady state and should not be contributing to increases in tacrolimus level  Given elevated tacrolimus trough despite missed doses, plan to give tacrolimus 2 mg x1 tonight and then Decrease to tacrolimus 3 mg QAM and 4 mg QPM starting tomorrow morning (10/18). Will recollect level in a few days to assess trend.    Follow-up  Next level has been ordered on 09/15/23 at 0800  (if remains inpatient, otherwise f/up level in clinic)  A pharmacist will continue to monitor and recommend levels as appropriate      Longitudinal Dose Monitoring:  Date Dose (mg), Route AM Scr (mg/dL) Level  (ng/mL) Key Drug Interactions   10/16 -- / -- 1.29 10.1 Fluconazole, letermovir   10/17 5/2 1.06 6.3 Fluconazole, letermovir       Please page service pharmacist with questions/clarifications.    Etheleen Sia, PharmD  PGY2 Oncology Pharmacy Resident

## 2023-09-13 NOTE — Unmapped (Signed)
BONE MARROW TRANSPLANT AND CELLULAR THERAPY PROGRESS NOTE    Patient Name: Timothy Porter  MRN: [id number]  Encounter Date: 09/13/23    [facility] Myeloma Physician: Dr Vivien Presto Leukemia Physician: Dr [name]  Primary Care Provider: Pcp, None Per Patient  BMT Attending MD: 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  Type of Transplant: RIC MRD Allo  Graft Source: Fresh PBSCs  Transplant Day: 1568 (Autologous PBSC Unknown Phase - Planned 05/29/2019), 22 (Allogeneic PBSC Unknown Phase - Planned 08/22/2023)     HPI:  Timothy Porter is a 77 y.o. 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 His transplant course was complicated by diarrhea (hx of IBS with alternating diarrhea/constipation; C diff neg) and fevers (culture neg, treated with Cefepime). Diarrhea required scheduled Lomotil and Imodium initially, but now controlled with PRN Imodium and patient self-titrating given IBS hx. He is incontinent at times. He also had hypomagnesemia requiring daily IV Mag and discharging with 2g IV Mag and hypokalemia discharging with 40k daily. BP medications were adjusted while admitted as he was HTN then developed orthostatic hypotension. He was discharged on 10mg  daily.     Timothy Porter is now admitted following a syncopal episode while being seen in BMT outpatient clinic for routine follow up. Following standing up for a weight pt became nonverbal and unable to follow commands. Per wife his eyes were open and pt was tachypneic. This episode lasted approximately 2 minutes. Following resolution pt states he had no recollection of the event. Vitals were taken following resolution where he was tachycardic and hypotensive. He has not had any episodes since. Reports better fluid intake over the past 48hrs, but reports poor food intake, LOA. Denies nausea/vomiting, states nothing sounds good. Continues to have ongoing diarrhea. Reports stools are still watery for the most part despite Imodium QID. Denies abd pain.      Denies SOB, palpitations, dizziness (with exception of when first sitting/standing and needing to gather himself prior to standing/walking). No new vision changes, cough, upper respiratory sx.    Interval History:  Timothy Porter is fatigued this morning. Says he didn't get a lot of sleep due to ED visit and CT scans.   -Currently denies dizziness while in bed.   -Tele shows PVCs   -BP stable off norvasc. Continues on metoprolol for hx of afib.   -Diarrhea continues. GIPP pending. Does have hx of IBS with diarrhea. Sometimes incontinent  -PT/OT ordered to assess safety for ADLs at home.   -Afebrile. Cultures pending.     Review of Systems:  A full system review was performed and was negative except as noted in the above interval history.    Temp:  [35.9 ??C (96.7 ??F)-36.9 ??C (98.4 ??F)] 36.7 ??C (98.1 ??F)  Heart Rate:  [83-122] 95  SpO2 Pulse:  [89-111] 89  Resp:  [11-22] 18  BP: (84-142)/(54-83) 140/68  MAP (mmHg):  [67-88] 88  SpO2:  [94 %-99 %] 97 %  BMI (Calculated):  [28.85] 28.85    I/O last 3 completed shifts:  In: -   Out: 710 [Urine:710]  I/O this shift:  In: -   Out: 350 [Urine:350]    Last 5 Recorded Weights    09/12/23 2235  Weight: (!) 116.2 kg (256 lb 2.8 oz)     Weight change:     Test Results:   Reviewed in EPIC. Abnormal values discussed below.     Scheduled Meds:   calcium carbonate  400 mg elem calcium Oral TID    cholecalciferol (vitamin D3 25 mcg (1,000 units))  25 mcg Oral Daily    cyanocobalamin (vitamin B-12)  2,000 mcg Oral Daily    fluconazole  400 mg Oral Daily    [START ON 09/14/2023] heparin, porcine (PF)  2 mL Intravenous Mon,Wed,Fri    letermovir  480 mg Oral Daily    loratadine  10 mg Oral Daily    metoPROLOL succinate  50 mg Oral Daily mycophenolate  1,000 mg Oral TID    pantoprazole  40 mg Oral Daily    sodium chloride 0.9%  1,000 mL Intravenous Once    sulfamethoxazole-trimethoprim  1 tablet Oral Mon,Wed,Fri    tacrolimus  5 mg Oral BID    tamsulosin  0.4 mg Oral Nightly    valACYclovir  500 mg Oral BID    zinc sulfate  220 mg Oral Daily     Continuous Infusions:  PRN Meds:.alum-mag-simeth, CETAPHIL, emollient combination no.92, fluticasone propionate, lidocaine, loperamide, magnesium oxide-Mg AA chelate, magnesium oxide-Mg AA chelate, lidocaine-diphenhydrAMINE-aluminum-magnesium, potassium chloride, potassium chloride, prochlorperazine **OR** prochlorperazine, saliva stimulant comb. no.3    Physical Exam:  General : No acute distress noted.   Central venous access: Line clean, dry, intact. No erythema or drainage noted.   ENT: Dry mucous membranes. Oropharhynx without lesions, erythema or exudate.   Cardiovascular: Pulse normal rate, regularity and rhythm. S1 and S2 normal, without any murmur, rub, or gallop.  Lungs: Clear to auscultation bilaterally, without wheezes/crackles/rhonchi. Good air movement.   Skin: Warm, dry, intact. Scattered bruises on arms  Psychiatry: Alert and oriented to person, place, and time.   GI : Normoactive bowel sounds, abdomen soft, non-tender   Extremeties: No edema.   Musculo Skeletal: Full range of motion in shoulder, elbow, hip knee, ankle, left hand and feet.  Neurologic: CNII-XII intact. Normal strength and sensation throughout    Assessment/Plan:    BMT: AML with prior history of MM. AML now in CR MRD Negative  HCT-CI (age adjusted) 2 (age and hx of A-fib)  Conditioning: RIC Flu/Bu      Donor: 8/8, ABO O+, CMV positive  Recipient A+ and CMV+  Engraftment: G-CSF starting Day + 5 through WBC recovery (as defined as ANC 1.0 x 2 days or 3.0 x 1 day)  -Date of last G-CSF injection: 09/06/23      GVHD prophylaxis:   1. Cyclophosphamide 50 mg/kg IV daily on days +3 and +4 with mesna  2. Tacrolimus to goal trough (5-10 ng/mL) starting day +5  3. Mycophenolate mofetil 15 mg/kg PO TID (max 1g PO TID) starting day +5 through day +35      Hem: Transfuse 1 unit of PRBCs for hemoglobin <7 and 1 unit of platelets for Plt <10K or bleeding. [name]. Bergemann does not have a history of transfusion reactions. Consent was obtained and documented on 08/15/23.     ID:   Prophylaxis:  -Antiviral: Valtrex 500 mg po BID , continue through 2 years post transplant                   Letermovir 480 mg po to start at day +15 and continue through day +200   -Antifungal: Fluconazole 400 mg po daily, continue through D+75  -  Antibacterial: none indicated  -PJP: Bactrim DS once daily MWF upon platelet engraftment (started 10/16)      GI:   GERD prophylaxis:  -Home med: Prilosec 40 mg daily. (switched to pantoprazole while inpatient)     Nausea:  -Zofran as needed     Hx of IBS with chronic alternating diarrhea/constipation  -Has managed with changing doses of Questran; increased to 1 packet BID in 02/2023 with improvement of diarrhea; self tapered to 0.5 packets BID  -Reports predominantly constipation concerns but would have occasional accidents; says he hasn't soiled himself in months with current dose  -Uses docusate only if no BM 3-4 days  -Meticulously self tracks and titrates medication  -Continue Questran dosing on admission with prn docusate; likely will need to hold if he develops diarrhea  -10/1: Changing Questran to PRN  -10/2: Cdiff negative  -10/10 Lomotil and Imodium to PRN  -10/14: Having 3-4 loose stools a day, using imodium 4mg  QID PRN. Diarrhea is a long standing issue for him, incontinence is not uncommon will need to assess for changes to BM pattern in clinic  - 09/12/2023: Continues to have watery diarrhea on admission, Imodium 4mg  QID prn. Can consider adding lomotil prn. Last c diff test 10/12, will hold off repeating for now.  -09/13/23: Orders in for GIPP.     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 On 10/16 admission Cr 1.29.   - 09/12/2023: Received 1L NS bolus in ED, will FU with admission/daily labs.  -09/13/23: Received an addition liter today.      FEN:  replacement per BMT prn protocol  Home meds: Vitamin D3 25 mcg daily, B12 2000 mcg daily, MV  Hypomagnesemia: 2g IV Mag daily with HH  Hypokalemia: 40k daily  Hypocalcemia: Calcium carbonate 2 tabs TID    Hepatic: Normal bilirubin. SOS prophylaxis not indicated for RIC regimen.     CV:   -Hx of A-fib with transplant in 2020; required diltiazem+metoprolol; resolved post acute episode     * Continues on metoprolol mainly for BP.      * Plan for cardiac electrolytes     -HTN: has discontinued diltiazem prior to the start of venetoclax (for DDI) currently managed with metoprolol monotherapy; decreased to 50 mg/day ~1-2 months ago  -9/20: Adding norvasc 5mg  daily for persistent HTN  -10/12: Increased Amlodipine to 10mg  daily   -09/12/2023: Given syncopal episode/hypotension will hold amlodipine on admission.     Syncopal Episode on 09/12/2023 while been seen in outpatient after standing for weight. Lost consciousness but did not hit head. Poor food intake per pt, improved fluid intake over past 48hrs. Continues to have watery diarrhea despite imodium use.  - 09/12/2023: Cardiac enzymes WNL. EKG showed sinus rhythm with premature atrial beats. Will start on tele. CT head without acute abnormalities, CT chest without acute abnormalities. Blood and urine cultures obtained in ED, FU results. Will hold off on antibiotics given no fevers.   -09/13/23: Reports that he had some dizziness with standing while at home. None so far today. Work up thus far has been negative for cardiac etiology. Likely combination of orthostasis from norvasc which is now being held and dehydration for decreased oral intake and diarrhea losses. Will continue tele x24hrs.     Pulm:   10/8 new oxygen requirement (10/9 off oxygen): CXR: shows no acute findings. RPP negative     ENT:  - Chronic cough when supine from PND? Try  flonase or saline spray     GU:   Hx of BPH, previously on flomax will restart 10/2 (having some urinary urgency and incontinence due to mobility issues)   Hold sildenafil (for ED)     Endocrine:  Hx of steroid induced hyperglycemia not currently on medications     Neuro/Pain: No issues     Psych: CCSP consult prn.      Deconditioning:  - PT/OT consulted, has OP PT referrals in, has Rolator in his room for discharge  - Uses a cane for walking.   The patient is confined to one room and will need a bedside commode    Myra Rude, ANP  [facility]

## 2023-09-13 NOTE — Unmapped (Signed)
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 His transplant course was complicated by diarrhea (hx of IBS with alternating diarrhea/constipation; C diff neg) and fevers (culture neg, treated with Cefepime). Presented today 10/16 to BMT clinic for his first post transplant OP visit after being recently discharged on 10/14. During the visit today witnessed to have syncopized after getting up to be weighed. He had lost consciousness for 1-2 minutes, was tachycardic and hypotensive.     Paged that patient is currently in the ED. Received 1L IVF with some good response however it is unclear the etiology of this syncopal episode.     Recommendations:  -Would recommend admission for syncopal workup  -Place on telemetry  -Obtain CT of the head  -Obtain cardiac enzymes, EKG  -Please send screening blood cultures, urinalysis and urine cultures  -If not febrile and no white count reasonable to hold of on antibiotics   -We will need to address the poor oral intake and make sure that the diarrhea is adequately controlled     Case d/w Dr. Dina Rich, MD

## 2023-09-13 NOTE — Unmapped (Signed)
Pt seen per a Virtual Admissions request placed by the primary RN.  Pts admission was completed with his spouse at the bedside.  An education assessment and initial education were completed.   Pt advised to use the call bell if they have questions, requests or needs to get out of bed.  Falls education was added to their discharge AVS.  Pt denies having home medications at bedside. A password was added to the chart and a HCDM's were updated during the admissions questionnaire.   This patient isn't a candidate for A flu vaccine due to recent transplants.  Pt's expected discharge date is in a couple days per the patient.    The most recent VS recorded for the Pt are noted below:  BP 142/64  - Pulse 95  - Temp 36.7 ??C (98.1 ??F) (Oral)  - Resp 18  - Ht 200.7 cm (6' 7)  - Wt (!) 116.2 kg (256 lb 2.8 oz)  - SpO2 97%  - BMI 28.86 kg/m??     The Pt did not complain of pain and denies any needs at this time.

## 2023-09-13 NOTE — Unmapped (Signed)
Tacrolimus Therapeutic Monitoring Pharmacy Note    Timothy Porter is a 77 y.o. male continuing tacrolimus.     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     Date of Transplant:  auto-SCT in 05/2019       Prior Dosing Information: Home regimen 5 mg BID       Source(s) of information used to determine prior to admission dosing: Clinic Note    Goals:  Therapeutic Drug Levels  Tacrolimus trough goal:  5-10  ng/mL     Additional Clinical Monitoring/Outcomes  Monitor renal function (SCr and urine output) and liver function (LFTs)  Monitor for signs/symptoms of adverse events (e.g., hyperglycemia, hyperkalemia, hypomagnesemia, hypertension, headache, tremor)    Results:   Tacrolimus level: Not applicable    Pharmacokinetic Considerations and Significant Drug Interactions:  Concurrent hepatotoxic medications: None identified  Concurrent CYP3A4 substrates/inhibitors: None identified  Concurrent nephrotoxic medications: None identified    Assessment/Plan:  Recommendedation(s)  Continue current regimen of 5 mg qam, 5 at bedtime     Follow-up  Next level to be determined by primary team.   A pharmacist will continue to monitor and recommend levels as appropriate    Please page service pharmacist with questions/clarifications.    Winferd Humphrey, PharmD

## 2023-09-13 NOTE — Unmapped (Signed)
PHYSICAL THERAPY  Evaluation (09/13/23 1400)          Patient Name:  Timothy Porter       Medical Record Number: [id number]   Date of Birth: 30-Jul-1946  Sex: Male        Post-Discharge Physical Therapy Recommendations:  PT Post Acute Discharge Recommendations: 3x weekly   Equipment Recommendation  PT DME Recommendations: None (Pt owns cane, RW, rollator.)          Treatment Diagnosis: Generalized muscle weakness, Unsteadiness on feet        Activity Tolerance: Limited by fatigue     ASSESSMENT  Problem List: Decreased endurance, Decreased strength, Decreased mobility, Impaired balance, Gait deviation, Fall risk, Impaired ADLs      Assessment : Timothy Porter is a 77 y.o. 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 His transplant course was complicated by diarrhea (hx of IBS with alternating diarrhea/constipation; C diff neg) and fevers (culture neg, treated with Cefepime). Pt presents to PT below his baseline of function, decreased endurance, decreased functional mobility. Pt requires extra time with mobility. Able to sit EOB 7-8 mins, SBA. Pt requires SBA for sit to stand from high bed level with RW. Able to ambulate 30 ft with RW, SBA, unsteady gait with no LOS. Given CLOF, recommend 3x/week post acute PT. After a review of the personal factors, comorbidities, clinical presentation, and examination of the number of affected body systems, the patient presents as a moderate complexity case.      Today's Interventions: AM-PAC . Eval, mobility, transfers, gait. Education: OOB with assist, ambulation with LRAD and assist, POC            PLAN  Planned Frequency of Treatment: Plan of Care Initiated: 09/13/23  1-2x per day Weekly Frequency: 3-4 days per week  Planned Treatment Duration: 09/27/23     Planned Interventions: Education (Patient/Family/Caregiver), Gait training, Home exercise program, Self-care / Home Management training, Therapeutic Exercise, Therapeutic Activity     Goals:   Patient and Family Goals: to return home     SHORT GOAL #1: Sit <> stand with LRAD, modI               Time Frame : 2 weeks  SHORT GOAL #2: Ambulate > 100 ft with LRAD, modI              Time Frame : 2 weeks                                    Long Term Goal #1: AM-PAC 23/24  Time Frame: 4 weeks     Prognosis:  Good  Positive Indicators: CLOF, PLOF, family support  Barriers to Discharge: Impaired Balance, Gait instability, Functional strength deficits, Endurance deficits, Inability to safely perform ADLS     SUBJECTIVE  Communication Preference: Verbal,     Patient reports: agreeable to PT     Services patient receives prior to admission: PT  Prior Functional Status: Pt is mod IND for mobility, he ambulates without a device at home (but does furniture walk) and uses a SPC for community mobility. He attends outpatient PT to address UE and LE weakness, balance impairments. He has had 1 mechanical fall over the  summer when he tripped by the pool; 2 other falls that were physiological- syncopal episodes after static standing for ~2 minutes. No falls in past 2 months. He walks for 20 minutes per day outside but takes rest breaks and walks at a slow pace. He waters his plants with a hose every day. Usually by 10:30am he is ready for a nap and takes another in the afternoon. He also has a [place] Bulldog.  Equipment available at home: Straight cane, Sock aid, Reacher, Rolling walker, Rollator        Past Medical History:   Diagnosis Date    Benign prostatic hyperplasia with lower urinary tract symptoms 11/27/2018    BPH (benign prostatic hyperplasia)     Diarrhea     Fatigue     Fractures     left arm    Glucose intolerance     May 2019: HbA1c 6.2.    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 CMV negative 04/07/20.     Pancytopenia (CMS-HCC) 03/14/2023    Prediabetes             Social History     Tobacco Use    Smoking status: Never    Smokeless tobacco: Never   Substance Use Topics    Alcohol use: Yes     Comment: 1 drink a month       Past Surgical History:   Procedure Laterality Date    FRACTURE SURGERY      HERNIA REPAIR      HIP SURGERY      JOINT REPLACEMENT      PR COLONOSCOPY W/BIOPSY SINGLE/MULTIPLE N/A 01/01/2018    Procedure: COLONOSCOPY, FLEXIBLE, PROXIMAL TO SPLENIC FLEXURE; WITH BIOPSY, SINGLE OR MULTIPLE;  Surgeon: Bronson Curb, MD;  Location: [facility];  Service: Gastroenterology    PR IMPLANT MESH HERNIA REPAIR/DEBRIDEMENT CLOSURE  05/07/2017    Procedure: implantation of mesh;  Surgeon: Judithann Graves, MD;  Location: MAIN OR [facility];  Service: Trauma    PR REPAIR ING HERNIA,5+Y/O,REDUCIBL Left 05/07/2017    Procedure: Repair direct and indirect inguinal hernia with mesh;  Surgeon: Judithann Graves, MD;  Location: MAIN OR [facility];  Service: Trauma    PR TOTAL HIP ARTHROPLASTY Right 10/14/2015    Procedure: ARTHROPLASTY, ACETABULAR & PROXIMAL FEMORAL PROSTHETIC REPLACEMENT (TOTAL HIP), W/WO AUTOGRAFT/ALLOGRAFT;  Surgeon: Malka So, MD;  Location: [facility] OR [facility];  Service: Orthopedics    VARICOSE VEIN SURGERY Right              Family History   Problem Relation Age of Onset    Dementia Father     No Known Problems Other         Allergies: Patient has no known allergies.                  Objective Findings  Precautions / Restrictions  Precautions: Isolation precautions, Bleeding Precautions, Protective precautions  Weight Bearing Status: Non-applicable  Required Braces or Orthoses: Non-applicable     Pain Comments: denies pain  Medical Tests / Procedures: Vital signs,  Labs, Imaging, Medical Review  Equipment / Environment: Vascular access (PIV, TLC, Port-a-cath, PICC), Purewick/Condom catheter     Vitals/Orthostatics : BP 140/68, HR 95, O2sat 97%RA     Living Situation  Living Environment: House  Lives With: Spouse, Daughter  Home Living: One level home, Able to Live on main level with bedroom/bathroom, Walk-in shower, Built-in shower seat, Hand-held shower hose, Handicapped height toilet, Grab bars in shower, Grab bars around toilet  Caregiver Identified?: Yes  Caregiver Availability: 24 hours  Caregiver Ability: Limited lifting      Cognition: WFL  Visual/Perception: Within Functional Limits  Hearing: No deficit identified     Skin Inspection: Intact where visualized     Upper Extremities  UE ROM: Right WFL, Left WFL  UE Strength: Right WFL, Left WFL    Lower Extremities  LE ROM: Right WFL, Left WFL  LE Strength: Right Impaired/Limited, Left Impaired/Limited  RLE Strength Impairment: Reduced strength  LLE Strength Impairment: Reduced strength  LE comment: LE 4/5 grossly    Face, Cervical and Trunk ROM  Face ROM: WFL  Cervical ROM: WFL  Trunk ROM: WFL     Coordination: Not tested  Proprioception: Not tested  Sensation: Not tested  Posture: WFL    Static Sitting-Level of Assistance: Standby Camera operator of Assistance: Administrator, sports of Assistance: Stand by assistance  Dynamic Standing - Level of Assistance: Stand by assistance  Standing Balance comments: Sit <> stand with RW      Bed Mobility: Supine to Sit  Supine to Sit assistance level: Standby assist, set-up cues, supervision of patient - no hands on     Transfers: Sit to Stand  Sit to Stand assistance level: Standby assist, set-up cues, supervision of patient - no hands on  Transfer comments: Sit <> stand with RW      Gait Level of Assistance: Standby assist, set-up cues, supervision of patient - no hands on  Gait Assistive Device: Rolling walker  Gait Distance Ambulated (ft): 30 ft (unsteady gait with no LOS)  Skilled Treatment Performed: gait     Stairs: NT      Wheelchair Mobility: N/a     Endurance: fair    Patient at end of session: All needs in reach, In bed, Friends/Family present, Lines intact, Nurse notified    Physical Therapy Session Duration  PT Individual [mins]: 25          AM-PAC-6 click  Help currently need turning over In bed?: A Little - Minimal/Contact Guard Assist/Supervision  Help currently needed sitting down/standing up from chair with arms? : A Little - Minimal/Contact Guard Assist/Supervision  Help currently needed moving from supine to sitting on edge of bed?: A Little - Minimal/Contact Guard Assist/Supervision  Help currently needed moving to and from bed from wheelchair?: A Little - Minimal/Contact Guard Assist/Supervision  Help currently needed walking in a hospital room?: A Little - Minimal/Contact Guard Assist/Supervision  Help currently needed climbing 3-5 steps with railing?: A Little - Minimal/Contact Guard Assist/Supervision    Basic Mobility Score 6 click: 18    6 click Score (in points): % of Functional Impairment, Limitation, Restriction  6: 100% impaired, limited, restricted  7-8: At least 80%, but less than 100% impaired, limited restricted  9-13: At least 60%, but less than 80% impaired, limited restricted  14-19: At least 40%, but less than 60% impaired, limited restricted  20-22: At least 20%, but less than 40% impaired, limited restricted  23: At  least 1%, but less than 20% impaired, limited restricted  24: 0% impaired, limited restricted        I attest that I have reviewed the above information.  Signed: Fredia Beets, PT  Filed 09/13/2023

## 2023-09-13 NOTE — Unmapped (Signed)
Bone Marrow Transplant and Cellular Therapy Program History and Physical    Patient Name: Timothy Porter  MRN: [id number]  Encounter Date: 09/12/23    [facility] Myeloma Physician: Dr Vivien Presto Leukemia Physician: Dr [name]  Primary Care Provider: Pcp, None Per Patient  BMT Attending MD: 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  Type of Transplant: RIC MRD Allo  Graft Source: Fresh PBSCs  Transplant Day: 1565 (Autologous PBSC Unknown Phase - Planned 05/29/2019), 19 (Allogeneic PBSC Unknown Phase - Planned 08/22/2023)     HPI:  Timothy Porter is a 77 y.o. 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 His transplant course was complicated by diarrhea (hx of IBS with alternating diarrhea/constipation; C diff neg) and fevers (culture neg, treated with Cefepime). Diarrhea required scheduled Lomotil and Imodium initially, but now controlled with PRN Imodium and patient self-titrating given IBS hx. He is incontinent at times. He also had hypomagnesemia requiring daily IV Mag and discharging with 2g IV Mag and hypokalemia discharging with 40k daily. BP medications were adjusted while admitted as he was HTN then developed orthostatic hypotension. He was discharged on 10mg  daily.      Donor information:   Type of stem cells: related male  Blood Type: O+  CMV Status: positive  Type of match: 8/8    Interval History:  Timothy Porter now presents for to the ED following a syncopal episode while being seen in BMT outpatient clinic for routine follow up. Following standing up for a weight pt became nonverbal and unable to follow commands. Per wife his eyes were open and pt was tachypneic. This episode lasted approximately 2 minutes. Following resolution pt states he had no recollection of the event. Vitals were taken following resolution where he was tachycardic and hypotensive. He has not had any episodes since. Reports better fluid intake over the past 48hrs, but reports poor food intake, LOA. Denies nausea/vomiting, states nothing sounds good. Continues to have ongoing diarrhea. Reports stools are still watery for the most part despite Imodium QID. Denies abd pain.     Denies SOB, palpitations, dizziness (with exception of when first sitting/standing and needing to gather himself prior to standing/walking). No new vision changes, cough, upper respiratory sx.    Hematology/Oncology History Overview Note   Referring/Local Oncologist:  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      This electronic signature is attestation that the pathologist personally reviewed the submitted material(s) and the final diagnosis reflects that evaluation.          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,XY[7]  Myeloid Mutational Panel:        Variants of Known/Likely Clinical Significance:   Gene Coding Predicted Protein Variant allele fraction   SRSF2 c.284C>G p.(Pro95Arg) 21.6 %       Treatment Timeline:  11/22/2022 Cycle 1:  BAML S12 azacitidine 75mg /m2 x 7 days + venetoclax 400mg  x 28 days  12/13/22: Bmbx - Limited, though no increase in blasts, MRD-neg by flow, poor specimen but no blasts (at <0.2%) 01/04/2023 Cycle 2: aza 75 x 7 + ven 400 x 28 days (2 week delay)  01/23/2023: BMbx: <5% blasts by CD34 immunohistochemistry, flow MRD negative  02/09/2023: Cycle 3: Aza 75 x 7 + ven 400 x 21  03/19/2023: Cycle 4 Aza 75 x 5 + ven 400 x 21 (Aza decreased due to delayed count recovery)  04/16/2023: Cycle 5: Aza 75 x 5 + ven 400 x 21  05/14/2023: Cycle 6: Aza 75 x 5 + ven 400 x 21  06/11/2023: Cycle 7: Aza 75 x 5 + ven 400 x 21  07/09/2023:  Cycle 8: Aza 75 x 5 + ven 400 x 21    08/02/23 BMBx:  Bone marrow, right iliac, aspiration and biopsy  -  Hypercellular bone marrow (~50%) with erythroid-predominant trilineage hematopoiesis (<1% blasts and <5% plasma cells by manual aspirate differential)  Cytogenetics: pending  MRD: Flow negative <0.02%        Multiple myeloma (CMS-HCC)   02/12/2019 Initial Diagnosis    Multiple myeloma (CMS-HCC)     05/28/2019 - 06/03/2019 Chemotherapy    BMT IP AUTO MELPHALAN  Melphalan 140 mg/m2 or 200 mg/m2 IV Day -1     10/22/2019 - 06/27/2022 Chemotherapy    STUDY [id number] IRB# [id number] LENALIDOMIDE (v. 11/04/18)  A Randomized Study of Daratumumab Plus Lenalidomide Versus Lenalidomide Alone as Maintenance Treatment in Patients with Newly Diagnosed Multiple Myeloma Who Are Minimal Residual Disease Positive After Frontline Autologous Stem Cell Transplant.     History of auto stem cell transplant (CMS-HCC) (Resolved)   08/05/2019 Initial Diagnosis    History of auto stem cell transplant (CMS-HCC)     10/22/2019 - 06/27/2022 Chemotherapy    STUDY [id number] IRB# [id number] LENALIDOMIDE (v. 11/04/18)  A Randomized Study of Daratumumab Plus Lenalidomide Versus Lenalidomide Alone as Maintenance Treatment in Patients with Newly Diagnosed Multiple Myeloma Who Are Minimal Residual Disease Positive After Frontline Autologous Stem Cell Transplant.     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 CMV negative 04/07/20.     Pancytopenia (CMS-HCC) 03/14/2023    Prediabetes      Past Surgical History:   Procedure Laterality Date  FRACTURE SURGERY      HERNIA REPAIR      HIP SURGERY      JOINT REPLACEMENT      PR COLONOSCOPY W/BIOPSY SINGLE/MULTIPLE N/A 01/01/2018    Procedure: COLONOSCOPY, FLEXIBLE, PROXIMAL TO SPLENIC FLEXURE; WITH BIOPSY, SINGLE OR MULTIPLE;  Surgeon: Bronson Curb, MD;  Location: [facility];  Service: Gastroenterology    PR IMPLANT MESH HERNIA REPAIR/DEBRIDEMENT CLOSURE  05/07/2017    Procedure: implantation of mesh;  Surgeon: Judithann Graves, MD;  Location: MAIN OR [facility];  Service: Trauma    PR REPAIR ING HERNIA,5+Y/O,REDUCIBL Left 05/07/2017    Procedure: Repair direct and indirect inguinal hernia with mesh;  Surgeon: Judithann Graves, MD;  Location: MAIN OR [facility];  Service: Trauma    PR TOTAL HIP ARTHROPLASTY Right 10/14/2015    Procedure: ARTHROPLASTY, ACETABULAR & PROXIMAL FEMORAL PROSTHETIC REPLACEMENT (TOTAL HIP), W/WO AUTOGRAFT/ALLOGRAFT;  Surgeon: Malka So, MD;  Location: [facility];  Service: Orthopedics    VARICOSE VEIN SURGERY Right      Social History     Substance and Sexual Activity   Alcohol Use Yes    Comment: 1 drink a month      Social History     Tobacco Use   Smoking Status Never   Smokeless Tobacco Never      Social History     Substance and Sexual Activity   Drug Use No     [phone] (home)   Family History   Problem Relation Age of Onset    Dementia Father     No Known Problems Other        No Known Allergies  No current facility-administered medications for this encounter.     Current Outpatient Medications   Medication Sig Dispense Refill    amlodipine (NORVASC) 10 MG tablet Take 1 tablet (10 mg total) by mouth daily. 30 tablet 1    calcium carbonate (TUMS) 200 mg calcium (500 mg) chewable tablet Chew 2 tablets (400 mg of elem calcium total) Three (3) times a day.      cholecalciferol, vitamin D3 25 mcg, 1,000 units,, 1,000 unit (25 mcg) tablet Take 1 tablet (25 mcg total) by mouth.      cyanocobalamin, vitamin B-12, 2,000 mcg Tab Take 2,000 mcg by mouth in the morning. 90 tablet 3    fluconazole (DIFLUCAN) 200 MG tablet Take 2 tablets (400 mg total) by mouth daily. 60 tablet 1    letermovir (PREVYMIS) 480 mg tablet Take 1 tablet (480 mg total) by mouth daily. 28 tablet 6    loperamide (IMODIUM) 2 mg capsule Take 2 capsules (4 mg total) by mouth four (4) times a day as needed for diarrhea. 30 capsule 0    loratadine (CLARITIN) 10 mg tablet Take 1 tablet (10 mg total) by mouth daily.      magnesium sulfate in 0.9 %NaCl 2 gram/50 mL PgBk Infuse 2 g into a venous catheter daily. 700 mL 0    metoPROLOL succinate (TOPROL-XL) 50 MG 24 hr tablet Take 1 tablet (50 mg total) by mouth daily. 30 tablet 11    multivitamin with minerals tablet Take 1 tablet by mouth daily.      mycophenolate (CELLCEPT) 500 mg tablet Take 2 tablets (1,000 mg total) by mouth Three (3) times a day. 98 tablet 0    omeprazole (PRILOSEC) 40 MG capsule TAKE 1 CAPSULE(40 MG) BY MOUTH DAILY 90 capsule 0    ondansetron (ZOFRAN)  8 MG tablet Take 1 tablet (8 mg total) by mouth every eight (8) hours as needed for nausea. 30 tablet 2    potassium chloride 20 MEQ ER tablet Take 2 tablets (40 mEq total) by mouth daily. 20 tablet 0    sildenafil citrate (SILDENAFIL ORAL) Take 20 mg by mouth Take as directed. TAKE 1 TO 5 TABLETS DAILY AS NEEDED FOR ERECTILE FUNCTION      sulfamethoxazole-trimethoprim (BACTRIM DS) 800-160 mg per tablet Take 1 tablet (160 mg of trimethoprim total) by mouth every Monday, Wednesday, and Friday. 12 tablet 5    tacrolimus (PROGRAF) 1 MG capsule Take 5 capsules (5 mg total) by mouth two (2) times a day. 300 capsule 5    tamsulosin (FLOMAX) 0.4 mg capsule Take 1 capsule (0.4 mg total) by mouth nightly. 30 capsule 1    valACYclovir (VALTREX) 500 MG tablet Take 1 tablet (500 mg total) by mouth two (2) times a day. 60 tablet 11     Facility-Administered Medications Ordered in Other Encounters   Medication Dose Route Frequency Provider Last Rate Last Admin    DTaP-hepatitis B recombinant-IPV (PEDIARIX) 10 mcg-25Lf-25 mcg-10Lf/0.5 mL injection             DTaP-hepatitis B recombinant-IPV (PEDIARIX) 10 mcg-25Lf-25 mcg-10Lf/0.5 mL injection             haemophilus B polysac-tetanus toxoid (ActHIB) 10 mcg/0.5 mL injection             influenza vaccine quad (FLUARIX, FLULAVAL, FLUZONE) (6 MOS & UP) 2020-21 ADS Med             pneumococcal conjugate (13-valent) (PREVNAR-13) 0.5 mL vaccine             pneumococcal conjugate (13-valent) (PREVNAR-13) 0.5 mL vaccine             varicella-zoster gE-AS01B (PF) (SHINGRIX) 50 mcg/0.5 mL injection                Review of Systems:   Constitutional: Denies fever, chills, sweats, unexplained weight loss   Eyes: Denies vision changes, eye pain, eye redness   ENT: Denies headaches, sore throat, hoarseness, sneezing, hearing loss, tinnitus, neck pain, stiffness, dizziness, tooth pain, gum pain, mouth ulcers   Skin: Denies rashes, sores, jaundice, itching, dryness   Cardiovascular: Denies chest pain, shortness of breath (at rest or with exertion), edema   Pulmonary: Denies chest congestion, cough, SOB  Endocrine: Denies polyuria, polydipsia, heat/cold intolerance   Gastrointestinal/Abdomen: Denies heartburn, early satiety, nausea, vomiting, abdominal pain, changes in bowel habits, blood per rectum  Genito-urinary: Denies frequency, urgency, dysuria, hematuria, nocturia   Musculoskeletal/Extremities: Denies myalgias, arthralgias, joint swelling, joint pain   Neurologic:  Denies numbness, tingling, weakness, changes in gait, confusion, slurred speech   Psychology:  Denies change in mood, insomnia   Heme/Lymph: As per HPI/PMH      Objective:  Temp:  [35.9 ??C (96.7 ??F)-36.9 ??C (98.4 ??F)] 36.9 ??C (98.4 ??F)  Heart Rate:  [91-122] 91  SpO2 Pulse:  [91-111] 91  Resp:  [11-22] 18  BP: (84-133)/(54-83) 116/64  MAP (mmHg):  [67-88] 79  SpO2:  [94 %-99 %] 95 %   There were no vitals filed for this visit.   80, Normal activity with effort; some signs or symptoms of disease (ECOG equivalent 1)    Physical Exam:  General : No acute distress noted.   Central venous access: Line clean, dry, intact. No erythema or drainage noted.   ENT: Moist  mucous membranes. Oropharhynx without lesions, erythema or exudate.   Cardiovascular: Pulse normal rate, regularity and rhythm. S1 and S2 normal, without any murmur, rub, or gallop.  Lungs: Clear to auscultation bilaterally, without wheezes/crackles/rhonchi. Good air movement.   Skin: Warm, dry, intact. No rash noted.   Psychiatry: Alert and oriented to person, place, and time.   GI : Normoactive bowel sounds, abdomen soft, non-tender   Extremeties: No edema.   Musculo Skeletal: Full range of motion in shoulder, elbow, hip knee, ankle, left hand and feet.  Neurologic: CNII-XII intact. Normal strength and sensation throughout    Test Results:   WBC   Date Value Ref Range Status   09/12/2023 6.6 3.6 - 11.2 10*9/L Final   09/03/2023 <0.1 (LL) 3.6 - 11.2 10*9/L Final     HGB   Date Value Ref Range Status   09/12/2023 9.1 (L) 12.9 - 16.5 g/dL Final     HCT   Date Value Ref Range Status   09/12/2023 25.9 (L) 39.0 - 48.0 % Final     Platelet   Date Value Ref Range Status   09/12/2023 114 (L) 150 - 450 10*9/L Final     Absolute Neutrophils   Date Value Ref Range Status   09/12/2023 4.5 1.8 - 7.8 10*9/L Final     Absolute Eosinophils   Date Value Ref Range Status   09/12/2023 0.0 0.0 - 0.5 10*9/L Final     Sodium   Date Value Ref Range Status   09/12/2023 136 135 - 145 mmol/L Final     Potassium   Date Value Ref Range Status   09/12/2023 4.0 3.4 - 4.8 mmol/L Final     Chloride   Date Value Ref Range Status   09/12/2023 104 98 - 107 mmol/L Final     CO2   Date Value Ref Range Status   09/12/2023 21.0 20.0 - 31.0 mmol/L Final     BUN   Date Value Ref Range Status   09/12/2023 18 9 - 23 mg/dL Final     Creatinine   Date Value Ref Range Status   09/12/2023 1.29 (H) 0.73 - 1.18 mg/dL Final     Glucose   Date Value Ref Range Status   09/12/2023 212 (H) 70 - 179 mg/dL Final     Calcium   Date Value Ref Range Status   09/12/2023 8.8 8.7 - 10.4 mg/dL Final     Magnesium   Date Value Ref Range Status   09/12/2023 1.9 1.6 - 2.6 mg/dL Final     Total Bilirubin   Date Value Ref Range Status   09/12/2023 0.4 0.3 - 1.2 mg/dL Final     Total Protein   Date Value Ref Range Status   09/12/2023 6.3 5.7 - 8.2 g/dL Final     T Albumin   Date Value Ref Range Status   06/11/2023 4.0 3.5 - 5.0 g/dL Final     Albumin   Date Value Ref Range Status   09/12/2023 3.3 (L) 3.4 - 5.0 g/dL Final     ALT   Date Value Ref Range Status   09/12/2023 31 10 - 49 U/L Final     AST   Date Value Ref Range Status   09/12/2023 30 <=34 U/L Final     Alkaline Phosphatase   Date Value Ref Range Status   09/12/2023 118 (H) 46 - 116 U/L Final     LDH   Date Value Ref Range Status  09/12/2023 232 120 - 246 U/L Final        DONOR STUDIES:  Type of stem cells: related male  Blood Type: O+  CMV Status: positive  Type of match: 8/8    Hep B S Ab   Date Value Ref Range Status   09/03/2019 Nonreactive Nonreactive, Grayzone Final     Comment:     Nonreactive and Grayzone results are considered non-immune.     Hep B Core Total Ab   Date Value Ref Range Status   08/02/2023 Nonreactive Nonreactive Final     Hepatitis C Ab   Date Value Ref Range Status   08/02/2023 Nonreactive Nonreactive Final     Comment:     Antibodies to HCV were not detected.  A nonreactive result does not exclude the possibility of exposure to HCV.     CMV IGG   Date Value Ref Range Status   08/02/2023 Positive (A) Negative Final     RPR   Date Value Ref Range Status   08/02/2023 Nonreactive Nonreactive Final     EBV VCA IgG Antibody   Date Value Ref Range Status   08/02/2023 Positive (A) Negative Final     EBV VCA IgM Antibody   Date Value Ref Range Status   08/02/2023 Negative Negative Final     EBV Nuclear Ag IgG Antibody   Date Value Ref Range Status   08/02/2023 Positive (A) Negative Final     VRE Screen   Date Value Ref Range Status   09/03/2023 NOT DETECTED  Final     HSV 1 IgG   Date Value Ref Range Status   08/02/2023 Positive (A) Negative Final     HSV 2 IgG   Date Value Ref Range Status   08/02/2023 Negative Negative Final     Varicella IgG   Date Value Ref Range Status   08/02/2023 Positive  Final     Toxoplasma Gondii IgG   Date Value Ref Range Status   08/02/2023 Negative Negative Final        Assessment/Plan:    BMT: AML with prior history of MM. AML now in CR MRD Negative  HCT-CI (age adjusted) 2 (age and hx of A-fib)  Conditioning: RIC Flu/Bu      Donor: 8/8, ABO O+, CMV positive  Recipient A+ and CMV+  Engraftment: G-CSF starting Day + 5 through WBC recovery (as defined as ANC 1.0 x 2 days or 3.0 x 1 day)  -Date of last G-CSF injection: 09/06/23     Date of neutrophil engraftment (first day of ANC >= 500/mm3 for 3 consecutive days): 09/07/23  Date of platelet engraftment (>20K x3 days without transfusion in 7 days): 09/10/23  Number of RBC infusions during transplant hospitalization: 2 units  Number of platelet infusions during transplant hospitalization: 2 units     GVHD prophylaxis:   1. Cyclophosphamide 50 mg/kg IV daily on days +3 and +4 with mesna  2. Tacrolimus to goal trough (5-10 ng/mL) starting day +5  3. Mycophenolate mofetil 15 mg/kg PO TID (max 1g PO TID) starting day +5 through day +35     Hem: Transfuse 1 unit of PRBCs for hemoglobin <7 and 1 unit of platelets for Plt <10K or bleeding. [name]. Oubre does not have a history of transfusion reactions. Consent was obtained and documented on 08/15/23.     ID:   Prophylaxis:  -Antiviral: Valtrex 500 mg po BID , continue through 2 years post transplant  Letermovir 480 mg po to start at day +15 and continue through day +200   -Antifungal: Fluconazole 400 mg po daily, continue through D+75  -Antibacterial: Levaquin 500 mg po daily to start day 0  -PJP: Bactrim DS once daily MWF upon platelet engraftment (started10/16)       GI:   GERD prophylaxis:  -Home med: Prilosec 40 mg daily. (switched to pantoprazole while inpatient)     Nausea:  -Zofran as needed     Hx of IBS with chronic alternating diarrhea/constipation  -Has managed with changing doses of Questran; increased to 1 packet BID in 02/2023 with improvement of diarrhea; self tapered to 0.5 packets BID  -Reports predominantly constipation concerns but would have occasional accidents; says he hasn't soiled himself in months with current dose  -Uses docusate only if no BM 3-4 days  -Meticulously self tracks and titrates medication  -Continue Questran dosing on admission with prn docusate; likely will need to hold if he develops diarrhea  -10/1: Changing Questran to PRN  -10/2: Cdiff negative  -10/10 Lomotil and Imodium to PRN  -10/14: Having 3-4 loose stools a day, using imodium 4mg  QID PRN. Diarrhea is a long standing issue for him, incontinence is not uncommon will need to assess for changes to BM pattern in clinic  - 09/12/2023: Continues to have watery diarrhea on admission, Imodium 4mg  QID prn. Can consider adding lomotil prn. Last c diff test 10/12, will hold off repeating for now.    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 On 10/16 admission Cr 1.29.   - 09/12/2023: Received 1L NS bolus in ED, will FU with admission/daily labs.    FEN:  replacement per BMT prn protocol  Home meds: Vitamin D3 25 mcg daily, B12 2000 mcg daily, MV  Hypomagnesemia: 2g IV Mag daily with HH  Hypokalemia: 40k daily  Hypocalcemia: Calcium carbonate 2 tabs TID    Hepatic: Normal bilirubin. SOS prophylaxis not indicated for RIC regimen.     CV:   -Hx of A-fib with transplant in 2020; required diltiazem+metoprolol; resolved post acute episode     * Continues on metoprolol mainly for BP.      * Plan for cardiac electrolytes     -HTN: has discontinued diltiazem prior to the start of venetoclax (for DDI) currently managed with metoprolol monotherapy; decreased to 50 mg/day ~1-2 months ago  -9/20: Adding norvasc 5mg  daily for persistent HTN  -10/12: Increased Amlodipine to 10mg  daily   -09/12/2023: Given syncopal episode/hypotension will hold amlodipine on admission.    Syncopal Episode on 09/12/2023 while been seen in outpatient after standing for weight. Lost consciousness but did not hit head. Poor food intake per pt, improved fluid intake over past 48hrs. Continues to have watery diarrhea despite imodium use.  - 09/12/2023: Cardiac enzymes WNL. EKG showed sinus rhythm with premature atrial beats. Will start on tele. CT head without acute abnormalities, CT chest without acute abnormalities. Blood and urine cultures obtained in ED, FU results. Will hold off on antibiotics given no fevers.     Pulm:   10/8 new oxygen requirement (10/9 off oxygen): CXR: shows no acute findings. RPP negative    ENT:  - Chronic cough when supine from PND? Try flonase or saline spray    GU:   Hx of BPH, previously on flomax will restart 10/2 (having some urinary urgency and incontinence due to mobility issues)   Hold sildenafil (  for ED)    Endocrine:  Hx of steroid induced hyperglycemia not currently on medications     Neuro/Pain: No issues     Psych: CCSP consult prn.     Deconditioning:  - PT/OT consulted, has OP PT referrals in, has Rolator in his room for discharge  - Uses a cane for walking.   The patient is confined to one room and will need a bedside commode      Lovie Chol, PA  Bone Marrow Transplant and Cellular Therapy Program    I personally spent 50 minutes face-to-face and non-face-to-face in the care of this patient, which includes all pre, intra, and post visit time on the date of service.

## 2023-09-13 NOTE — Unmapped (Signed)
Adult Nutrition Assessment Note    Visit Type: MD Consult (PA Consult)  Reason for Visit: Have you gained or lost 10 pounds in the past 3 months?, Have you had a decrease in food intake or appetite?, Other (Specify) (Poor PO since transplant)    NUTRITION INTERVENTIONS and RECOMMENDATION     Zinc 220mg  x 10 days  Reviewed home, add nuun tab for added electrolytes/flavor  Snack: 4 bottled 8.5 ounce coke daily  Trial magic cup vanilla BID  Continue make small attempts oral intake throughout day  Continue regular weight checks    NUTRITION ASSESSMENT     Home Vitamins continued this admission  PRN loperamide in place for diarrhea. Pt current c.diff negative  Per Rounds, Pt BP med on hold due to admitting symptoms. 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 Pt will need assistancde    NUTRITIONALLY RELEVANT DATA     HPI & PMH:   Per record, Mycheal now presents for to the ED following a syncopal episode while being seen in BMT outpatient clinic for routine follow up. Following standing up for a weight pt became nonverbal and unable to follow commands. Per wife his eyes were open and pt was tachypneic.   Past Medical History:   Diagnosis Date    Benign prostatic hyperplasia with lower urinary tract symptoms 11/27/2018    BPH (benign prostatic hyperplasia)     Diarrhea     Fatigue     Fractures     left arm    Glucose intolerance     May 2019: HbA1c 6.2.    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 CMV negative 04/07/20.     Pancytopenia (CMS-HCC) 03/14/2023    Prediabetes          Nutrition History:   Pt has been followed by Nutrition Services course of his transplant (08/17/23 to 09/06/23). He had anorexia, dysgeusia symptoms    Rounds today,  Pt w/ wife, reports continued bites of food at meals. He was having water diarrhea at home but slowed down with immodium. He was improving with hydration but return low intake today. His wife and family have been offering range of foods and foods he thinks he wants but when he attempts to eat he doesn't want it. He remains have altered taste acuity. He is finding coke soda tasteful and gingerale still tasting ok. He is beginning to like taste of water again.      Medications:  Nutritionally pertinent medications reviewed and evaluated for potential food and/or medication interactions.     Labs:   Nutritionally pertinent labs reviewed.     Nutritional Needs:   Healthy balance of carbohydrate, protein, and fat.     Malnutrition Assessment:  Malnutrition Assessment using AND/ASPEN or GLIM Clinical Characteristics:    Non-severe (Moderate) Protein-Calorie Malnutrition in the context of chronic illness (09/13/23 1157)  Energy Intake: < 75% of estimated energy requirement for > or equal to 1 month  Interpretation of Wt. Loss: > or equal to 5% x 1 month  Fat Loss: Mild  Muscle Loss: Mild  Malnutrition Score: 4            Nutrition Focused Physical Exam: 09/06/23 exam  Fat Areas Examined  Orbital: Mild loss                          Muscle Areas Examined  Temple: Moderate loss  Clavicle: Mild loss  Acromion: Mild loss  Dorsal Hand: No loss  Patellar: Mild loss  Anterior Thigh: Mild loss    Care plan:  Completed    Current Nutrition:  Oral intake   Nutrition Orders            Nutrition Therapy Regular/House starting at 10/16 2252            Nutritionally Pertinent Allergies, Intolerances, Sensitivities, and/or Cultural/Religious Restrictions:  none identified at this time     Anthropometric Data:  Height: 200.7 cm (6' 7) Admission weight: (!) 116.2 kg (256 lb 2.8 oz)  Last recorded weight: (!) 116.2 kg (256 lb 2.8 oz)  Date of last recorded weight: 09/12/23  IBW: 99.82 kg  BMI: Body mass index is 28.86 kg/m??.   Usual Body Weight:  122.7 kg    8.2% loss since 08/15/23- significant  Wt Readings from Last 12 Encounters:   09/12/23 (!) 116.2 kg (256 lb 2.8 oz)   09/12/23 (!) 119 kg (262 lb 5.6 oz)   09/09/23 (!) 117.8 kg (259 lb 11.2 oz)   08/15/23 (!) 126.9 kg (279 lb 12.2 oz)   08/07/23 (!) 126.7 kg (279 lb 4.8 oz)   07/13/23 (!) 127.6 kg (281 lb 4.9 oz)   07/09/23 (!) 126.1 kg (278 lb)   07/02/23 (!) 124.1 kg (273 lb 9.6 oz)   06/11/23 (!) 124.8 kg (275 lb 2.2 oz)   05/17/23 (!) 123 kg (271 lb 2.7 oz)   05/15/23 (!) 122.5 kg (270 lb 1 oz)   05/14/23 (!) 123.6 kg (272 lb 7.8 oz)         GOALS and EVALUATION     Patient to meet 75% or greater of nutritional needs via combination of meals, snacks, and/or oral supplements within 1 week.  - New/Progressing    Motivation, Barriers, and Compliance:  Evaluation of motivation, barriers, and compliance completed. No concerns identified at this time.     Discharge Planning:   Monitor for potential discharge needs with multi-disciplinary team.          Follow-Up Parameters:   1-2 times per week (and more frequent as indicated)    Ed Blalock, MS, RD, CSO, LDN  Pager # [phone]

## 2023-09-13 NOTE — Unmapped (Signed)
Bed: 70-D  Expected date:   Expected time:   Means of arrival:   Comments:  Bed 12

## 2023-09-13 NOTE — Unmapped (Signed)
Pt day +22 from allo SCT.  Readmitted for syncope episode in clinic.  Afebrile/VSS overnight.  Denies pain/n/v.  Having loose stools; PRN imodium given 2x. Cdiff negative 10/12.  GI path panel ordered; enteric precautions started.  3.6 K level - replacing per orders.  Call bell in reach.     Problem: Adult Inpatient Plan of Care  Goal: Plan of Care Review  Outcome: Progressing  Goal: Patient-Specific Goal (Individualized)  Outcome: Progressing  Goal: Absence of Hospital-Acquired Illness or Injury  Outcome: Progressing  Intervention: Identify and Manage Fall Risk  Recent Flowsheet Documentation  Taken 09/12/2023 2230 by Franne Forts, RN  Safety Interventions:   bleeding precautions   family at bedside   fall reduction program maintained   infection management   isolation precautions   lighting adjusted for tasks/safety   low bed   neutropenic precautions   nonskid shoes/slippers when out of bed  Intervention: Prevent Infection  Recent Flowsheet Documentation  Taken 09/12/2023 2230 by Franne Forts, RN  Infection Prevention:   cohorting utilized   environmental surveillance performed   equipment surfaces disinfected   hand hygiene promoted   personal protective equipment utilized   rest/sleep promoted   single patient room provided  Goal: Optimal Comfort and Wellbeing  Outcome: Progressing  Goal: Readiness for Transition of Care  Outcome: Progressing  Goal: Rounds/Family Conference  Outcome: Progressing     Problem: Fall Injury Risk  Goal: Absence of Fall and Fall-Related Injury  Outcome: Progressing  Intervention: Promote Scientist, clinical (histocompatibility and immunogenetics) Documentation  Taken 09/12/2023 2230 by Franne Forts, RN  Safety Interventions:   bleeding precautions   family at bedside   fall reduction program maintained   infection management   isolation precautions   lighting adjusted for tasks/safety   low bed   neutropenic precautions   nonskid shoes/slippers when out of bed     Problem: Pain Acute  Goal: Optimal Pain Control and Function  Outcome: Progressing     Problem: Comorbidity Management  Goal: Blood Pressure in Desired Range  Outcome: Progressing     Problem: Infection  Goal: Absence of Infection Signs and Symptoms  Outcome: Progressing  Intervention: Prevent or Manage Infection  Recent Flowsheet Documentation  Taken 09/12/2023 2230 by Franne Forts, RN  Infection Management: aseptic technique maintained  Isolation Precautions: protective precautions maintained

## 2023-09-14 LAB — MAGNESIUM
MAGNESIUM: 1.2 mg/dL — ABNORMAL LOW (ref 1.6–2.6)
MAGNESIUM: 1.1 mg/dL — ABNORMAL LOW (ref 1.6–2.6)
MAGNESIUM: 2.2 mg/dL (ref 1.6–2.6)

## 2023-09-14 LAB — BASIC METABOLIC PANEL
ANION GAP: 7 mmol/L (ref 5–14)
BLOOD UREA NITROGEN: 12 mg/dL (ref 9–23)
BUN / CREAT RATIO: 13
CALCIUM: 7.8 mg/dL — ABNORMAL LOW (ref 8.7–10.4)
CHLORIDE: 108 mmol/L — ABNORMAL HIGH (ref 98–107)
CO2: 25 mmol/L (ref 20.0–31.0)
CREATININE: 0.89 mg/dL
EGFR CKD-EPI (2021) MALE: 89 mL/min/{1.73_m2} (ref >=60)
GLUCOSE RANDOM: 112 mg/dL (ref 70–179)
POTASSIUM: 4 mmol/L (ref 3.4–4.8)
SODIUM: 140 mmol/L (ref 135–145)

## 2023-09-14 MED ORDER — TACROLIMUS 1 MG CAPSULE, IMMEDIATE-RELEASE
ORAL_CAPSULE | ORAL | 5 refills | 30 days
Start: 2023-09-14 — End: ?

## 2023-09-14 MED ADMIN — heparin, porcine (PF) 100 unit/mL injection 2 mL: 2 mL | INTRAVENOUS | @ 03:00:00

## 2023-09-14 MED ADMIN — fluconazole (DIFLUCAN) tablet 400 mg: 400 mg | ORAL | @ 13:00:00 | Stop: 2023-10-03

## 2023-09-14 MED ADMIN — mycophenolate (CELLCEPT) tablet 1,000 mg: 1000 mg | ORAL

## 2023-09-14 MED ADMIN — sulfamethoxazole-trimethoprim (BACTRIM DS) 800-160 mg tablet 160 mg of trimethoprim: 1 | ORAL | @ 13:00:00 | Stop: 2023-10-29

## 2023-09-14 MED ADMIN — loperamide (IMODIUM) capsule 4 mg: 4 mg | ORAL | @ 18:00:00

## 2023-09-14 MED ADMIN — calcium carbonate (TUMS) chewable tablet 400 mg elem calcium: 400 mg | ORAL | @ 13:00:00

## 2023-09-14 MED ADMIN — magnesium oxide-Mg AA chelate (Magnesium Plus Protein) 2 tablet: 2 | ORAL | @ 09:00:00 | Stop: 2023-09-14

## 2023-09-14 MED ADMIN — magnesium sulfate 2gm/50mL IVPB: 2 g | INTRAVENOUS | @ 15:00:00

## 2023-09-14 MED ADMIN — letermovir (PREVYMIS) tablet 480 mg: 480 mg | ORAL | @ 13:00:00

## 2023-09-14 MED ADMIN — tacrolimus (PROGRAF) capsule 3 mg: 3 mg | ORAL | @ 13:00:00

## 2023-09-14 MED ADMIN — tamsulosin (FLOMAX) 24 hr capsule 0.4 mg: .4 mg | ORAL

## 2023-09-14 MED ADMIN — zinc sulfate (ZINCATE) capsule 220 mg: 220 mg | ORAL | @ 13:00:00 | Stop: 2023-09-23

## 2023-09-14 MED ADMIN — metoPROLOL succinate (Toprol-XL) 24 hr tablet 50 mg: 50 mg | ORAL | @ 13:00:00

## 2023-09-14 MED ADMIN — loperamide (IMODIUM) capsule 4 mg: 4 mg | ORAL | @ 13:00:00 | Stop: 2023-09-14

## 2023-09-14 MED ADMIN — mycophenolate (CELLCEPT) tablet 1,000 mg: 1000 mg | ORAL | @ 13:00:00

## 2023-09-14 MED ADMIN — magnesium sulfate 2gm/50mL IVPB: 2 g | INTRAVENOUS | @ 18:00:00

## 2023-09-14 MED ADMIN — cholecalciferol (vitamin D3 25 mcg (1,000 units)) tablet 25 mcg: 25 ug | ORAL | @ 13:00:00

## 2023-09-14 MED ADMIN — loratadine (CLARITIN) tablet 10 mg: 10 mg | ORAL | @ 13:00:00

## 2023-09-14 MED ADMIN — valACYclovir (VALTREX) tablet 500 mg: 500 mg | ORAL | @ 13:00:00

## 2023-09-14 MED ADMIN — calcium carbonate (TUMS) chewable tablet 400 mg elem calcium: 400 mg | ORAL

## 2023-09-14 MED ADMIN — mycophenolate (CELLCEPT) tablet 1,000 mg: 1000 mg | ORAL | @ 18:00:00

## 2023-09-14 MED ADMIN — prochlorperazine (COMPAZINE) tablet 10 mg: 10 mg | ORAL | @ 14:00:00

## 2023-09-14 MED ADMIN — calcium carbonate (TUMS) chewable tablet 400 mg elem calcium: 400 mg | ORAL | @ 18:00:00

## 2023-09-14 MED ADMIN — tacrolimus (PROGRAF) capsule 2 mg: 2 mg | ORAL | Stop: 2023-09-13

## 2023-09-14 MED ADMIN — cyanocobalamin (vitamin B-12) tablet 2,000 mcg: 2000 ug | ORAL | @ 13:00:00

## 2023-09-14 MED ADMIN — loperamide (IMODIUM) capsule 4 mg: 4 mg | ORAL | @ 09:00:00 | Stop: 2023-09-14

## 2023-09-14 MED ADMIN — pantoprazole (Protonix) EC tablet 40 mg: 40 mg | ORAL | @ 13:00:00

## 2023-09-14 MED ADMIN — valACYclovir (VALTREX) tablet 500 mg: 500 mg | ORAL

## 2023-09-14 MED ADMIN — loperamide (IMODIUM) capsule 4 mg: 4 mg | ORAL | @ 03:00:00

## 2023-09-14 MED ADMIN — magnesium sulfate 2gm/50mL IVPB: 2 g | INTRAVENOUS | @ 16:00:00

## 2023-09-14 MED ADMIN — diphenoxylate-atropine (LOMOTIL) 2.5-0.025 mg per tablet 1 tablet: 1 | ORAL | @ 15:00:00

## 2023-09-14 NOTE — Unmapped (Signed)
Luminal Gastroenterology Consult Service   Initial Consultation         Assessment and Recommendations:   Timothy Porter is a 77 y.o. 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 The patient is seen in consultation at the request of Margretta Ditty, MD (Bone Marrow (MDT)) for  acute diarrhea .    Acute Diarrhea   Timothy Porter is a 77 yo M with recent stem cell transplant one month ago for AML who presented for syncope, AKI, and worsening diarrhea. During his admission for SCT, he was having 3-4 bowel movements per day that were well managed with imodium. C diff negative X2 on 10/2 and 10/12. Now patient reporting eight brown watery bowel movements per day. No associated symptoms such as fever, chills, abdominal pain, nausea, or vomiting. No melena or hematochezia. The differential includes infectious etiologies in the setting of immunosuppression (CMV, HSV, C diff), medication effect (magnesium oxide, Cellcept), GVHD, and IBS given history of such. We recommend endoscopic evaluation to evaluate for these etiologies, tentatively 10/21.    Recommendations:  -- Agree with transition from oral to IV magnesium supplementation   -- Continue scheduled imodium 4 mg q6hrs   -- Please re-send C diff on 10/19 (will be 7 days out from last test)  -- Plan for colonoscopy on 10/21  -- Low fiber diet on 10/19, clear liquid diet on 10/20, begin prep in afternoon on 10/20    GI Pre-Procedure Checklist  Procedure: Colonoscopy  Anticipated Date of Procedure: 10/21  Anticoagulants/Antiplatelets: Not Applicable  Anesthesia Concerns: None  Diet: Order clear liquid and make NPO at 12AM (midnight) day of procedure  Prep: Give 4L Golytely bowel prep afternoon on 10/20. Order bowel prep using MED GI PROCEDURES PREP order-set. You must select the appropriate prep, this is not auto-selected. If Golytely is not available from pharmacy, please give Gatorade+Miralax prep. The patient is adequately prepped when their stool is clear and yellow, like urine. Continue to order bowel prep until the patients stools are clear. The patient can continue drinking prep past midnight if not clear, but must be strictly NPO of all oral intake for at least 2 hours before procedure.    Issues Impacting Complexity of Management:  -None    Recommendations discussed with the patient's primary team. We will continue to follow along with you.    Subjective:   Timothy Porter underwent stem cell transplant during admission from 9/19-10/14. During this hospitalization, he was having 3-4 bowel movements per day. He was using imodium 1 mg QID PRN. He presented to BMT clinic on 10/16 and experienced a brief episode of loss of consciousness while standing to be weighed. CT head and CT chest without acute pathology. Blood cultures no growth at 24 hours. Patient provided IV hydration.     Today, patient reports multiple small volume bowel movements per day. He's had 8 bowel movements so far today. They were initially dark brown but today's was the color of peanut butter. No black, tarry stools or bright red blood observed. He is having nocturnal bowel movements. No associated abdominal pain, nausea, or vomiting, but does endorse decreased appetite. He ate a couple of bites of toast this morning and drank about 20 oz of water. He denies fever and chills.  Since admission, patient has been afebrile and HD stable. Notably, he was orthostatic today. Labs notable for anemia to 7.5 and thrombocytopenia to 102. He initially presented with AKI (Cr: 1.29) which has returned to baseline around 0.7-0.8. Magnesium 1.1 this morning. He is currently on imodium 4 mg q6hrs and mycophenolate 1000 mg TID    -I have reviewed the patient's prior records from [facility] BMT as summarized in the HPI    Objective:   Temp:  [36.5 ??C (97.7 ??F)-36.9 ??C (98.5 ??F)] 36.6 ??C (97.8 ??F)  Heart Rate:  [80-102] 102  Resp:  [18-20] 20  BP: (76-145)/(49-77) 76/49  SpO2:  [94 %-97 %] 97 %    Gen: Well appearing male in NAD, answers questions appropriately, wife at bedside  Eyes: Sclera anicteric, EOMI  HENT: Atraumatic, normocephalic  Lungs: Breathing comfortably on room air  Abdomen: Normoactive bowel sounds, soft, NTND, no rebound/guarding  Extremities: Trace LE edema  Skin:  No rashes, lesions on clothed exam  Psych: Alert, oriented, normal mood and affect.     Pertinent Labs/Studies:  -I have reviewed the patient's labs from 10/18 which show stable Hgb, stable renal function (SCr, electrolytes), and hypomagnesemia      Colonoscopy 12/2017  - One 2 mm polyp in the cecum, removed with a cold biopsy forceps. Resected and retrieved.  - Diverticulosis in the sigmoid colon.  - The examined portion of the ileum was normal.  - Internal hemorrhoids.

## 2023-09-14 NOTE — Unmapped (Signed)
[name]: Pt is alert and oriented x4 and VSS. Diarrhea prominent. Sample received around noon and sent for GI path. Nausea also present and compazine given once for this. NS bolus given. PT and OT worked with pt and he is currently OOB in the chair. Tele continued- no calls regarding this. Bed locked in lowest position with call light in reach. Protective and enteric precautions maintained. No acute events.   Problem: Adult Inpatient Plan of Care  Goal: Plan of Care Review  Outcome: Ongoing - Unchanged  Goal: Patient-Specific Goal (Individualized)  Outcome: Ongoing - Unchanged  Goal: Absence of Hospital-Acquired Illness or Injury  Outcome: Ongoing - Unchanged  Intervention: Identify and Manage Fall Risk  Recent Flowsheet Documentation  Taken 09/13/2023 0715 by Jennette Bill, RN  Safety Interventions:   aspiration precautions   assistive device   bed alarm   bleeding precautions   commode/urinal/bedpan at bedside   environmental modification   fall reduction program maintained   family at bedside   infection management   isolation precautions   lighting adjusted for tasks/safety   low bed   nonskid shoes/slippers when out of bed  Intervention: Prevent Skin Injury  Recent Flowsheet Documentation  Taken 09/13/2023 0715 by Jennette Bill, RN  Positioning for Skin: Supine/Back  Device Skin Pressure Protection: adhesive use limited  Skin Protection: adhesive use limited  Intervention: Prevent Infection  Recent Flowsheet Documentation  Taken 09/13/2023 0715 by Jennette Bill, RN  Infection Prevention:   cohorting utilized   environmental surveillance performed   equipment surfaces disinfected   personal protective equipment utilized   hand hygiene promoted   rest/sleep promoted   single patient room provided   visitors restricted/screened  Goal: Optimal Comfort and Wellbeing  Outcome: Ongoing - Unchanged  Goal: Readiness for Transition of Care  Outcome: Ongoing - Unchanged  Goal: Rounds/Family Conference  Outcome: Ongoing - Unchanged     Problem: Fall Injury Risk  Goal: Absence of Fall and Fall-Related Injury  Outcome: Ongoing - Unchanged  Intervention: Promote Injury-Free Environment  Recent Flowsheet Documentation  Taken 09/13/2023 0715 by Jennette Bill, RN  Safety Interventions:   aspiration precautions   assistive device   bed alarm   bleeding precautions   commode/urinal/bedpan at bedside   environmental modification   fall reduction program maintained   family at bedside   infection management   isolation precautions   lighting adjusted for tasks/safety   low bed   nonskid shoes/slippers when out of bed     Problem: Pain Acute  Goal: Optimal Pain Control and Function  Outcome: Ongoing - Unchanged     Problem: Comorbidity Management  Goal: Blood Pressure in Desired Range  Outcome: Ongoing - Unchanged     Problem: Infection  Goal: Absence of Infection Signs and Symptoms  Outcome: Ongoing - Unchanged  Intervention: Prevent or Manage Infection  Recent Flowsheet Documentation  Taken 09/13/2023 0715 by Jennette Bill, RN  Infection Management: aseptic technique maintained  Isolation Precautions:   enteric precautions maintained   protective precautions maintained     Problem: Malnutrition  Goal: Improved Nutritional Intake  Outcome: Ongoing - Unchanged     Problem: Self-Care Deficit  Goal: Improved Ability to Complete Activities of Daily Living  Outcome: Ongoing - Unchanged

## 2023-09-14 NOTE — Unmapped (Signed)
BONE MARROW TRANSPLANT AND CELLULAR THERAPY PROGRESS NOTE    Patient Name: Timothy Porter  MRN: [id number]  Encounter Date: 09/14/23    [facility] Myeloma Physician: Dr Vivien Presto Leukemia Physician: Dr [name]  Primary Care Provider: Pcp, None Per Patient  BMT Attending MD: 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  Type of Transplant: RIC MRD Allo  Graft Source: Fresh PBSCs  Transplant Day: 1569 (Autologous PBSC Unknown Phase - Planned 05/29/2019), 23 (Allogeneic PBSC Unknown Phase - Planned 08/22/2023)     HPI:  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 His transplant course was complicated by diarrhea (hx of IBS with alternating diarrhea/constipation; C diff neg) and fevers (culture neg, treated with Cefepime). Diarrhea required scheduled Lomotil and Imodium initially, but now controlled with PRN Imodium and patient self-titrating given IBS hx. He is incontinent at times. He also had hypomagnesemia requiring daily IV Mag and discharging with 2g IV Mag and hypokalemia discharging with 40k daily. BP medications were adjusted while admitted as he was HTN then developed orthostatic hypotension. He was discharged on 10mg  daily.     Timothy Porter is now admitted following a syncopal episode while being seen in BMT outpatient clinic for routine follow up. Following standing up for a weight pt became nonverbal and unable to follow commands. Per wife his eyes were open and pt was tachypneic. This episode lasted approximately 2 minutes. Following resolution pt states he had no recollection of the event. Vitals were taken following resolution where he was tachycardic and hypotensive. He has not had any episodes since. Reports better fluid intake over the past 48hrs, but reports poor food intake, LOA. Denies nausea/vomiting, states nothing sounds good. Continues to have ongoing diarrhea. Reports stools are still watery for the most part despite Imodium QID. Denies abd pain.      Denies SOB, palpitations, dizziness (with exception of when first sitting/standing and needing to gather himself prior to standing/walking). No new vision changes, cough, upper respiratory sx.    Interval History:  Timothy Porter denies further dizziness. Tele continues to show intermittent PVCs. Will also transfuse a unit of blood today.  -Diarrhea worse today with explosive episodes and he is sometimes incontinent. GIPP pending. Has hx of IBS. Also got a dose of oral mag which could be contributing. Maxing out imodium with lomotil prn. GI consulted for further management.  -BP a little elevated but will continue to hold norvasc for now. Continues on metoprolol for hx of afib.   -PT/OT ordered to assess safety for ADLs at home-recommended outpt PT. SW is working on this.  -Afebrile. Cultures pending.     Review of Systems:  A full system review was performed and was negative except as noted in the above interval history.    Temp:  [36.5 ??C (97.7 ??F)-36.9 ??C (98.5 ??F)] 36.6 ??C (97.8 ??F)  Heart Rate:  [80-101] 86  Resp:  [18-20] 20  BP: (120-145)/(61-77) 130/66  MAP (mmHg):  [78-94] 78  SpO2:  [94 %-97 %] 96 %    I/O last 3 completed shifts:  In: 360 [P.O.:360]  Out: 2310 [Urine:2310]  I/O this shift:  In: -   Out: 950 [Urine:950]  Last 5 Recorded Weights    09/12/23 2235 09/13/23 2015   Weight: (!) 116.2 kg (256 lb 2.8 oz) (!) 116.8 kg (257 lb 8 oz)     Weight change: 0.6 kg (1 lb 5.2 oz)    Test Results:   Reviewed in EPIC. Abnormal values discussed below.     Scheduled Meds:   calcium carbonate  400 mg elem calcium Oral TID    cholecalciferol (vitamin D3 25 mcg (1,000 units))  25 mcg Oral Daily    cyanocobalamin (vitamin B-12)  2,000 mcg Oral Daily    fluconazole  400 mg Oral Daily    heparin, porcine (PF)  2 mL Intravenous Mon,Wed,Fri    letermovir  480 mg Oral Daily    loperamide  4 mg Oral Q6H    loratadine  10 mg Oral Daily    metoPROLOL succinate  50 mg Oral Daily    mycophenolate  1,000 mg Oral TID    pantoprazole  40 mg Oral Daily    sulfamethoxazole-trimethoprim  1 tablet Oral Mon,Wed,Fri    tacrolimus  3 mg Oral Daily    tacrolimus  4 mg Oral Daily    tamsulosin  0.4 mg Oral Nightly    valACYclovir  500 mg Oral BID    zinc sulfate  220 mg Oral Daily     Continuous Infusions:  PRN Meds:.alum-mag-simeth, CETAPHIL, diphenoxylate-atropine, emollient combination no.92, fluticasone propionate, lidocaine, magnesium sulfate, lidocaine-diphenhydrAMINE-aluminum-magnesium, potassium chloride, potassium chloride, prochlorperazine **OR** prochlorperazine, saliva stimulant comb. no.3    Physical Exam:  General : No acute distress noted.   Central venous access: Line clean, dry, intact. No erythema or drainage noted.   ENT: Dry mucous membranes. Oropharhynx without lesions, erythema or exudate.   Cardiovascular: Pulse normal rate, regularity and rhythm. S1 and S2 normal, without any murmur, rub, or gallop.  Lungs: Clear to auscultation bilaterally, without wheezes/crackles/rhonchi. Good air movement.   Skin: Warm, dry, intact. Scattered bruises on arms  Psychiatry: Alert and oriented to person, place, and time.   GI : Normoactive bowel sounds, abdomen soft, non-tender   Extremeties: No edema.   Musculo Skeletal: Full range of motion in shoulder, elbow, hip knee, ankle, left hand and feet.  Neurologic: CNII-XII intact. Normal strength and sensation throughout    Assessment/Plan:    BMT: AML with prior history of MM. AML now in CR MRD Negative  HCT-CI (age adjusted) 2 (age and hx of A-fib)  Conditioning: RIC Flu/Bu      Donor: 8/8, ABO O+, CMV positive  Recipient A+ and CMV+  Engraftment: G-CSF starting Day + 5 through WBC recovery (as defined as ANC 1.0 x 2 days or 3.0 x 1 day)  -Date of last G-CSF injection: 09/06/23      GVHD prophylaxis:   1. Cyclophosphamide 50 mg/kg IV daily on days +3 and +4 with mesna  2. Tacrolimus to goal trough (5-10 ng/mL) starting day +5  3. Mycophenolate mofetil 15 mg/kg PO TID (max 1g PO TID) starting day +5 through day +35      Hem: Transfuse 1 unit of PRBCs for hemoglobin <7 and 1 unit of platelets for Plt <10K or bleeding. [name]. Ostrosky does not have a history of transfusion reactions. Consent was obtained and documented on 08/15/23.     ID:   Prophylaxis:  -Antiviral: Valtrex 500 mg po BID , continue through 2 years post transplant  Letermovir 480 mg po to start at day +15 and continue through day +200   -Antifungal: Fluconazole 400 mg po daily, continue through D+75  -Antibacterial: none indicated  -PJP: Bactrim DS once daily MWF upon platelet engraftment (started 10/16)      GI:   GERD prophylaxis:  -Home med: Prilosec 40 mg daily. (switched to pantoprazole while inpatient)     Nausea:  -Zofran as needed     Hx of IBS with chronic alternating diarrhea/constipation  -Has managed with changing doses of Questran; increased to 1 packet BID in 02/2023 with improvement of diarrhea; self tapered to 0.5 packets BID  -Reports predominantly constipation concerns but would have occasional accidents; says he hasn't soiled himself in months with current dose  -Uses docusate only if no BM 3-4 days  -Meticulously self tracks and titrates medication  -Continue Questran dosing on admission with prn docusate; likely will need to hold if he develops diarrhea  -10/1: Changing Questran to PRN  -10/2: Cdiff negative  -10/10 Lomotil and Imodium to PRN  -10/14: Having 3-4 loose stools a day, using imodium 4mg  QID PRN. Diarrhea is a long standing issue for him, incontinence is not uncommon will need to assess for changes to BM pattern in clinic  - 09/12/2023: Continues to have watery diarrhea on admission, Imodium 4mg  QID prn. Can consider adding lomotil prn. Last c diff test 10/12, will hold off repeating for now.  -09/13/23: Orders in for GIPP.   -09/14/23: Diarrhea worse today with explosive episodes and he is sometimes incontinent. GIPP pending. Has hx of IBS. Also got a dose of oral mag which could be contributing. Maxing out imodium with lomotil prn. GI consulted for further management.    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 On 10/16 admission Cr 1.29.   - 09/12/2023: Received 1L NS bolus in ED, will FU with admission/daily labs.  -09/13/23: Received an addition liter today.      FEN:  replacement per BMT prn protocol  Home meds: Vitamin D3 25 mcg daily, B12 2000 mcg daily, MV  Hypomagnesemia: 2g IV Mag daily with HH  Hypokalemia: 40k daily  Hypocalcemia: Calcium carbonate 2 tabs TID    Hepatic: Normal bilirubin. SOS prophylaxis not indicated for RIC regimen.     CV:   -Hx of A-fib with transplant in 2020; required diltiazem+metoprolol; resolved post acute episode     * Continues on metoprolol mainly for BP.      * Plan for cardiac electrolytes     -HTN: has discontinued diltiazem prior to the start of venetoclax (for DDI) currently managed with metoprolol monotherapy; decreased to 50 mg/day ~1-2 months ago  -9/20: Adding norvasc 5mg  daily for persistent HTN  -10/12: Increased Amlodipine to 10mg  daily   -09/12/2023: Given syncopal episode/hypotension will hold amlodipine on admission.    Syncopal Episode on 09/12/2023 while been seen in outpatient after standing for weight. Lost consciousness but did not hit head. Poor food intake per pt, improved fluid intake over past 48hrs. Continues to have watery diarrhea despite imodium use.  - 09/12/2023: Cardiac enzymes WNL. EKG showed sinus rhythm with premature atrial beats. Will start on tele. CT head without acute abnormalities, CT chest without acute abnormalities. Blood and urine cultures obtained in ED, FU results. Will hold off on antibiotics given no fevers. -09/13/23: Reports that he had some dizziness with standing while at home. None so far today.  Work up thus far has been negative for cardiac etiology. Likely combination of orthostasis from norvasc which is now being held and dehydration for decreased oral intake and diarrhea losses. Will continue tele x24hrs.   -09/14/23: No further dizziness. Orthostatic BPs ordered for today. PVCs on tele.    Pulm:   10/8 new oxygen requirement (10/9 off oxygen): CXR: shows no acute findings. RPP negative     ENT:  - Chronic cough when supine from PND? Try flonase or saline spray     GU:   Hx of BPH, previously on flomax will restart 10/2 (having some urinary urgency and incontinence due to mobility issues)   Hold sildenafil (for ED)     Endocrine:  Hx of steroid induced hyperglycemia not currently on medications     Neuro/Pain: No issues     Psych: CCSP consult prn.      Deconditioning:  - PT/OT consulted, has OP PT referrals in, has Rolator in his room for discharge  - Uses a cane for walking.   The patient is confined to one room and will need a bedside commode    Myra Rude, ANP  [facility]

## 2023-09-14 NOTE — Unmapped (Signed)
Pt day +23 alloSCT, AO x4 and VSS overnight. No complaints of pain or nausea but endorses fatigue. Had one loose BM, imodium x2 given. Primofit in place. Magnesium replaced per order parameters.    Problem: Adult Inpatient Plan of Care  Goal: Plan of Care Review  Outcome: Ongoing - Unchanged  Goal: Patient-Specific Goal (Individualized)  Outcome: Ongoing - Unchanged  Goal: Absence of Hospital-Acquired Illness or Injury  Outcome: Ongoing - Unchanged  Intervention: Identify and Manage Fall Risk  Recent Flowsheet Documentation  Taken 09/13/2023 2015 by Mathis Dad, RN  Safety Interventions:   aspiration precautions   bleeding precautions   commode/urinal/bedpan at bedside   environmental modification   fall reduction program maintained   infection management   isolation precautions   lighting adjusted for tasks/safety   low bed   neutropenic precautions  Intervention: Prevent Skin Injury  Recent Flowsheet Documentation  Taken 09/13/2023 2015 by Mathis Dad, RN  Positioning for Skin: Supine/Back  Device Skin Pressure Protection:   absorbent pad utilized/changed   adhesive use limited  Skin Protection:   adhesive use limited   cleansing with dimethicone incontinence wipes   incontinence pads utilized  Intervention: Prevent and Manage VTE (Venous Thromboembolism) Risk  Recent Flowsheet Documentation  Taken 09/13/2023 2015 by Mathis Dad, RN  VTE Prevention/Management:   ambulation promoted   fluids promoted  Anti-Embolism Intervention: Off  Intervention: Prevent Infection  Recent Flowsheet Documentation  Taken 09/13/2023 2015 by Mathis Dad, RN  Infection Prevention:   cohorting utilized   environmental surveillance performed   equipment surfaces disinfected   hand hygiene promoted   personal protective equipment utilized   rest/sleep promoted   single patient room provided   visitors restricted/screened  Goal: Optimal Comfort and Wellbeing  Outcome: Ongoing - Unchanged  Goal: Readiness for Transition of Care  Outcome: Ongoing - Unchanged  Goal: Rounds/Family Conference  Outcome: Ongoing - Unchanged     Problem: Fall Injury Risk  Goal: Absence of Fall and Fall-Related Injury  Outcome: Ongoing - Unchanged  Intervention: Identify and Manage Contributors  Recent Flowsheet Documentation  Taken 09/13/2023 2015 by Mathis Dad, RN  Self-Care Promotion: independence encouraged  Intervention: Promote Injury-Free Environment  Recent Flowsheet Documentation  Taken 09/13/2023 2015 by Mathis Dad, RN  Safety Interventions:   aspiration precautions   bleeding precautions   commode/urinal/bedpan at bedside   environmental modification   fall reduction program maintained   infection management   isolation precautions   lighting adjusted for tasks/safety   low bed   neutropenic precautions     Problem: Pain Acute  Goal: Optimal Pain Control and Function  Outcome: Ongoing - Unchanged     Problem: Comorbidity Management  Goal: Blood Pressure in Desired Range  Outcome: Ongoing - Unchanged     Problem: Infection  Goal: Absence of Infection Signs and Symptoms  Outcome: Ongoing - Unchanged  Intervention: Prevent or Manage Infection  Recent Flowsheet Documentation  Taken 09/13/2023 2015 by Mathis Dad, RN  Infection Management: aseptic technique maintained  Isolation Precautions:   enteric precautions maintained   protective precautions maintained     Problem: Malnutrition  Goal: Improved Nutritional Intake  Outcome: Ongoing - Unchanged  Intervention: Promote and Optimize Oral Intake  Recent Flowsheet Documentation  Taken 09/13/2023 2015 by Mathis Dad, RN  Oral Nutrition Promotion: calorie-dense foods provided     Problem: Self-Care Deficit  Goal: Improved Ability to Complete Activities of Daily Living  Outcome: Ongoing - Unchanged  Intervention: Promote Activity and Functional Independence  Recent Flowsheet Documentation  Taken 09/13/2023 2015 by Mathis Dad, RN  Self-Care Promotion: independence encouraged     Problem: Skin Injury Risk Increased  Goal: Skin Health and Integrity  Outcome: Ongoing - Unchanged  Intervention: Optimize Skin Protection  Recent Flowsheet Documentation  Taken 09/13/2023 2015 by Mathis Dad, RN  Activity Management: up ad lib  Pressure Reduction Techniques: frequent weight shift encouraged  Pressure Reduction Devices: pressure-redistributing mattress utilized  Skin Protection:   adhesive use limited   cleansing with dimethicone incontinence wipes   incontinence pads utilized  Intervention: Promote and Optimize Oral Intake  Recent Flowsheet Documentation  Taken 09/13/2023 2015 by Mathis Dad, RN  Oral Nutrition Promotion: calorie-dense foods provided

## 2023-09-14 NOTE — Unmapped (Signed)
OCCUPATIONAL THERAPY  Evaluation (09/13/23 1322)    Patient Name:  Timothy Porter       Medical Record Number: [id number]   Date of Birth: 1946-08-27  Sex: Male      Post-Discharge Occupational Therapy Recommendations: 3x weekly          Equipment Recommendation  OT DME Recommendations: None       OT Treatment Diagnosis: Reduced mobility, Need for assistance with personal care    Pt presents with decreased endurance and activity tolerance, significantly limiting his ability to participate safely in bADLs.    Assessment  Problem List: Decreased endurance, Decreased mobility, Fall risk, Impaired balance, Decreased strength, Decreased activity tolerance, Impaired ADLs  Personal Factors/Comorbidities (Occupational Profile and History Review): Expanded (Moderate)  Clinical Decision Making: Moderate Complexity  Assessment: Mr. Highsmith is a 77 y.o. 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 His transplant course was complicated by diarrhea (hx of IBS with alternating diarrhea/constipation; C diff neg) and fevers (culture neg, treated with Cefepime). Diarrhea required scheduled Lomotil and Imodium initially, but now controlled with PRN Imodium and patient self-titrating given IBS hx. He is incontinent at times. He also had hypomagnesemia requiring daily IV Mag and discharging with 2g IV Mag and hypokalemia discharging with 40k daily. BP medications were adjusted while admitted as he was HTN then developed orthostatic hypotension. He was discharged on 10mg  daily. Pt presents with decreased strength, endurance, and activity tolerance. He reports that for the last couple of weeks, he has felt weaker than usual and has required more assistance with ADLs from his wife. Typically, he is IND with all ADLs. In today's session, pt performs bed mobility with mod I, STS with min A and RW, and ambulates household distances with RW and CGA. He is currently below his baseline level of function and would continue to benefit from skilled acute OT services to increase safety and independence with ADLs. He is currently appropriate for 3x weekly with continued 24/7 supervision/assistance. Pt at low threshold for 5x weekly with low intensity d/t decreased OOB mobility.  Today's Interventions: Balance activities, Bed mobility, Functional mobility, Education - Family / caregiver, Education - Patient, Endurance activities  Today's Interventions: Pt performs bed mobility with mod I. Completes STS with min A and RW, and ambulates household distances with RW and CGA. Performs stand-step t/f with CGA and RW from bed <> BSC. Requires max A for toileting hygiene. Performs hand hygiene in standing at sink with CGA and RW.    Activity Tolerance During Today's Session  Tolerated treatment well    Plan  Planned Frequency of Treatment: Plan of Care Initiated: 09/13/23  1-2x per day Weekly Frequency: 3-4x week  Planned Treatment Duration: 09/27/23    Planned Interventions:  Education (Patient/Family/Caregiver), Self-Care/Home Training, Therapeutic Exercise, Therapeutic Activity, Home Exercise Program      GOALS:   Patient and Family Goals: To gain more independence with daily tasks.    Short Term:   SHORT GOAL #1: Pt will complete 5+ minute ADL task in standing with mod I and LRAD.   Time Frame : 1 week  SHORT GOAL #2: Pt will complete functional toilet t/f and hygiene with mod I and LRAD.   Time Frame : 1 week  SHORT GOAL #3: Pt will complete LB dressing with  mod I, utilizing AE as needed.   Time Frame : 1 week      Long Term Goal #1: Pt will be IND/Mod I with all ADLs.  Time Frame: 2 weeks    Prognosis:  Good  Positive Indicators:  PLOF, social support  Barriers to Discharge: Impaired Balance, Gait instability, Functional strength deficits, Endurance deficits, Inability to safely perform ADLS    Subjective  Medical Updates Since Last Visit/Relevant PMH Affecting Clinical Decision Making: N/A  Prior Functional Status Pt is IND/mod I at baseline. He states that lately, he has been receiving help from his wife with most ADLs at home. Uses a built-in shower bench for showering. Has recently felt very weak and has struggled to walk household distances. He uses the RW and rollator for ambulation - chooses device based on how fatigued he is feeling.    Medical Tests / Procedures: N/A       Patient / Caregiver reports: I've just felt so weak.    Past Medical History:   Diagnosis Date    Benign prostatic hyperplasia with lower urinary tract symptoms 11/27/2018    BPH (benign prostatic hyperplasia)     Diarrhea     Fatigue     Fractures     left arm    Glucose intolerance     May 2019: HbA1c 6.2.    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 CMV negative 04/07/20.     Pancytopenia (CMS-HCC) 03/14/2023    Prediabetes     Social History     Tobacco Use    Smoking status: Never    Smokeless tobacco: Never   Substance Use Topics    Alcohol use: Yes     Comment: 1 drink a month      Past Surgical History:   Procedure Laterality Date    FRACTURE SURGERY      HERNIA REPAIR      HIP SURGERY      JOINT REPLACEMENT      PR COLONOSCOPY W/BIOPSY SINGLE/MULTIPLE N/A 01/01/2018    Procedure: COLONOSCOPY, FLEXIBLE, PROXIMAL TO SPLENIC FLEXURE; WITH BIOPSY, SINGLE OR MULTIPLE;  Surgeon: Bronson Curb, MD;  Location: [facility];  Service: Gastroenterology    PR IMPLANT MESH HERNIA REPAIR/DEBRIDEMENT CLOSURE  05/07/2017    Procedure: implantation of mesh;  Surgeon: Judithann Graves, MD;  Location: MAIN OR [facility];  Service: Trauma    PR REPAIR ING HERNIA,5+Y/O,REDUCIBL Left 05/07/2017    Procedure: Repair direct and indirect inguinal hernia with mesh;  Surgeon: Judithann Graves, MD;  Location: MAIN OR [facility];  Service: Trauma    PR TOTAL HIP ARTHROPLASTY Right 10/14/2015    Procedure: ARTHROPLASTY, ACETABULAR & PROXIMAL FEMORAL PROSTHETIC REPLACEMENT (TOTAL HIP), W/WO AUTOGRAFT/ALLOGRAFT;  Surgeon: Malka So, MD;  Location: [facility];  Service: Orthopedics    VARICOSE VEIN SURGERY Right     Family History   Problem Relation Age of Onset    Dementia Father     No Known Problems Other         Patient has no known  allergies.     Objective Findings  Precautions / Restrictions  Isolation precautions, Bleeding Precautions, Protective precautions       Weight Bearing  Non-applicable    Required Braces or Orthoses  Non-applicable    Communication Preference  Verbal       Pain  Pt denies pain during session.    Equipment / Environment  Vascular access (PIV, TLC, Port-a-cath, PICC), Purewick/Condom catheter    Living Situation  Living Environment: House  Lives With: Spouse, Daughter  Home Living: One level home, Able to Live on main level with bedroom/bathroom, Walk-in shower, Built-in shower seat, Hand-held shower hose, Handicapped height toilet, Grab bars in shower, Grab bars around toilet  Caregiver Identified?: Yes  Caregiver Availability: 24 hours  Caregiver Ability: Limited lifting  Equipment available at home: Straight cane, Sock aid, Reacher, Rolling walker, Rollator     Cognition   Orientation Level:  Oriented x 4   Arousal/Alertness:  Appropriate responses to stimuli   Attention Span:  Appears intact   Memory:  Appears intact   Following Commands:  Follows all commands and directions without difficulty   Safety Judgment:  Good awareness of safety precautions   Awareness of Errors and Problem Solving:  Patient self-corrected errors   Comments:      Vision / Hearing   Vision: No acute deficits identified     Hearing: No deficit identified         Hand Function:  Right Hand Function: Right hand grip strength, ROM and coordination WNL  Left Hand Function: Left hand grip strength, ROM and coordination WNL  Hand Dominance: Right    Skin Inspection:  Skin Inspection: Intact where visualized    Face/Cervical ROM:  Face ROM: WFL  Cervical ROM: WFL    ROM / Strength:  UE ROM/Strength: Left WFL, Right WFL  LE ROM/Strength: Left WFL, Right WFL    Coordination:  Coordination: WFL    Sensation:  RUE Sensation: RUE intact  LUE Sensation: LUE intact  RLE Sensation: RLE intact  LLE Sensation: LLE intact    Balance:  Static Sitting-Level of Assistance: Independent  Dynamic Sitting-Level of Assistance: Archivist Standing-Level of Assistance: Contact guard  Dynamic Standing - Level of Assistance: Contact guard  Standing Balance comments: Stands with RW    Functional Mobility  Transfers: Min assist (Requires Min A for STS transition with RW. Requires less assist when transitioning from higher level surface.)  Bed Mobility - Needs Assistance: Modified Independent  Ambulation: Ambulates household-level distances with RW and CGA with no LOB. Requires verbal cueing for safe management of RW.    ADLs  ADLs: Needs assistance with ADLs  ADLs - Needs Assistance: Grooming, Bathing, Toileting, UB dressing, LB dressing  Grooming - Needs Assistance: Set Up Assist, Performed seated  Bathing - Needs Assistance: Performed seated, Mod assist (Requires mod A for washing BLEs)  Toileting - Needs Assistance: Performed seated, Contact Guard assist, Performed standing, Max assist (CGA with standing to void, Max A for toileting hygiene in standing)  UB Dressing - Needs Assistance: Set Up Assist, Performed seated  LB Dressing - Needs Assistance: Performed seated, Mod assist (Requires total A for donning socks, although he reports that he uses a sockaid at home. Mod A for threading LB clothing over BLEs.)  IADLs: NT    Vitals / Orthostatics  Vitals/Orthostatics: NAD    Patient at end of session: All needs in reach, Staff present, Lines intact, Friends/Family present (sitting at EOB working  with PT)     Occupational Therapy Session Duration  OT Individual [mins]: 37      AM-PAC-Daily Activity  Lower Body Dressing assistance needs: A lot - Maximum/Moderate Assistance  Bathing assistance needs: A lot - Maximum/Moderate Assistance  Toileting assistance needs: A lot - Maximum/Moderate Assistance  Upper Body Dressing assistance needs: A Little - Minimal/Contact Guard Assist/Supervision  Personal Grooming assistance needs: A Little - Minimal/Contact Guard Assist/Supervision  Eating Meals assistance needs: None - Modified Independent/Independent    Daily Activity Score: 16    Score (in points): % of Functional Impairment, Limitation, Restriction  6: 100% impaired, limited, restricted  7-8: At least 80%, but less than 100% impaired, limited restricted  9-13: At least 60%, but less than 80% impaired, limited restricted  14-19: At least 40%, but less than 60% impaired, limited restricted  20-22: At least 20%, but less than 40% impaired, limited restricted  23: At least 1%, but less than 20% impaired, limited restricted  24: 0% impaired, limited restricted      I attest that I have reviewed the above information.  Signed: Sande Rives, OT  Filed 09/13/2023      The care for this patient was completed by Sande Rives, OT:  A student was present and participated in the care. Licensed/Credentialed therapist was physically present and immediately available to direct and supervise tasks that were related to patient management. The direction and supervision was continuous throughout the time these tasks were performed.    Sande Rives, OT

## 2023-09-14 NOTE — Unmapped (Signed)
Care Management  Initial Transition Planning Assessment              General  Care Manager assessed the patient by : In person interview with patient, Discussion with Clinical Care team, Medical record review  Orientation Level: Oriented X4  Functional level prior to admission: Independent  Reason for referral: Discharge Planning    Patient lives with spouse Lurena Joiner in [place] [facility]) in a single level home with 6 steps to enter. At baseline patient is independent with ADL's. He uses [facility] for home infusion services. DME is a cane and rolling walker. Wife Lurena Joiner will be primary caregiver.  Patient lives in [place] and will discharge home.      Type of Residence: Mailing Address:  [address]  [place] [address]  Contacts: Accompanied by: Alone  Password: [id number]  Patient Phone Number: [phone] (mobile)         Medical Provider(s): Pcp, None Per Patient  Reason for Admission: Admitting Diagnosis:  Syncope, unspecified syncope type [R55]  Past Medical History:   has a past medical history of Benign prostatic hyperplasia with lower urinary tract symptoms (11/27/2018), BPH (benign prostatic hyperplasia), Diarrhea, Fatigue, Fractures, Glucose intolerance, H/O autologous stem cell transplant (CMS-HCC), History of atrial fibrillation (03/15/2021), HTN (hypertension), Multiple myeloma (CMS-HCC), 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CMS-HCC) (07/01/2019), Pancytopenia (CMS-HCC) (03/14/2023), and Prediabetes.  Past Surgical History:   has a past surgical history that includes Varicose vein surgery (Right); pr total hip arthroplasty (Right, 10/14/2015); Joint replacement; Hip surgery; pr repair ing hernia,5+y/o,reducibl (Left, 05/07/2017); pr implant mesh hernia repair/debridement closure (05/07/2017); pr colonoscopy w/biopsy single/multiple (N/A, 01/01/2018); Hernia repair; and Fracture surgery.   Previous admit date: 08/16/2023    Primary Insurance- Payor: HU[employer]NA [employer]P MEDICARE ADV / Plan: HU[employer]NA SHP MEDICARE ADV / Product Type: *No Product type* /   Secondary Insurance - None  Prescription Coverage -   Preferred Pharmacy - [facility] [phone] - [place], [place] - [address] AT [address] [address] [facility]  [address]  [facility]  [facility] [place], [place] - [address]  [facility]    Transportation home: Private vehicle       Contact/Decision Maker  Extended Emergency Contact Information  Primary Emergency Contact: King,Rebecca  Address: [address]           [place], [place] [address] [place]  Home Phone: [phone]  Mobile Phone: [phone]  Relation: Spouse  Secondary Emergency Contact: Tippen,Joe MD  Address: [address]           [place], [place] [address] [place]  Home Phone: [phone]  Mobile Phone: [phone]  Relation: Son    Legal Next of Kin / Guardian / [place] / Advance Directives     HCDM (patient stated preference): King,Rebecca - Spouse - [phone]    HCDM, First Alternate: Perezperez,Joe MD - Son [phone]    HCDM, back-up (If primary HCDM is unavailable): Bauernfeind,Sallie - Daughter - [phone]    Advance Directive (Medical Treatment)  Does patient have an advance directive covering medical treatment?: Patient has advance directive covering medical treatment, copy in chart.    Health Care Decision Maker [HCDM] (Medical & Mental Health Treatment)  Healthcare Decision Maker: HCDM documented in the HCDM/Contact Info section. (King,Rebecca Spouse [phone])  Information offered on HCDM, Medical & Mental Health advance directives:: Patient declined information.    Advance  Directive (Mental Health Treatment)  Does patient have an advance directive covering mental health treatment?: Patient has advance directive covering mental health treatment, copy in chart.    Readmission Information    Have you been hospitalized in the last 30 days?: Yes  Name of Hospital: [facility]  Were you being cared for at a skilled nursing facility:: No        Number of Days between previous discharge and readmission date: 1-3 days    Type of Readmission: Related to Previous Admission    Readmission Source: Home       Did the following happen with your discharge?          Did you get your discharge medications?  : Yes       Did you go to your scheduled follow up appointment with your doctor on discharge?: Yes       Which services or equipment arranged after your discharge arrived?: Other (comment) ([facility] infusion)    Did you understand your discharge instructions?: Yes       Contributing Factors for Readmission: Complex medical history    Did you feel prepared for discharge?: Yes        Patient Information  Lives with: Spouse/significant other    Type of Residence: Private residence             Support Systems/Concerns: Spouse, Children    Responsibilities/Dependents at home?: No    Home Care services in place prior to admission?: Yes  Type of Home Care services in place prior to admission: Home infusion  Current Home Care provider (Name/Phone #): [facility]            Equipment Currently Used at Home: walker, rolling       Currently receiving outpatient dialysis?: No       Financial Information       Need for financial assistance?: No       Social Determinants of Health  Social Determinants of Health were addressed in provider documentation.  Please refer to patient history.  Social Determinants of Health     Food Insecurity: No Food Insecurity (08/17/2023)    Hunger Vital Sign     Worried About Running Out of Food in the Last Year: Never true     Ran Out of Food in the Last Year: Never true   Internet Connectivity: No Internet connectivity concern identified (11/02/2022)    Internet Connectivity     Do you have access to internet services: Yes     How do you connect to the internet: Personal Device at home     Is your internet connection strong enough for you to watch video on your device without major problems?: Yes     Do you have enough data to get through the month?: Yes     Does at least one of the devices have a camera that you can use for video chat?: Yes   Housing/Utilities: Low Risk  (08/17/2023)    Housing/Utilities     Within the past 12 months, have you ever stayed: outside, in a car, in a tent, in an overnight shelter, or temporarily in someone else's home (i.e. couch-surfing)?: No     Are you worried about losing your housing?: No     Within the past 12 months, have you been unable to get utilities (heat, electricity) when it was really needed?: No   Tobacco Use: Low Risk  (09/13/2023)    Patient History  Smoking Tobacco Use: Never     Smokeless Tobacco Use: Never     Passive Exposure: Not on file   Transportation Needs: No Transportation Needs (08/17/2023)    PRAPARE - Transportation     Lack of Transportation (Medical): No     Lack of Transportation (Non-Medical): No   Alcohol Use: Not At Risk (10/17/2019)    Alcohol Use     How often do you have a drink containing alcohol?: Monthly or less     How many drinks containing alcohol do you have on a typical day when you are drinking?: 1 - 2     How often do you have 5 or more drinks on one occasion?: Never   Interpersonal Safety: Unknown (09/14/2023)    Interpersonal Safety     Unsafe Where You Currently Live: Not on file     Physically Hurt by Anyone: Not on file     Abused by Anyone: Not on file   Physical Activity: Insufficiently Active (05/28/2019)    Exercise Vital Sign     Days of Exercise per Week: 7 days     Minutes of Exercise per Session: 20 min   Intimate Partner Violence: Not At Risk (03/04/2021)    Humiliation, Afraid, Rape, and Kick questionnaire     Fear of Current or Ex-Partner: No     Emotionally Abused: No     Physically Abused: No     Sexually Abused: No   Stress: No Stress Concern Present (05/28/2019)    [employer] of Occupational Health - Occupational Stress Questionnaire     Feeling of Stress : Not at all Substance Use: Not on file   Social Connections: Unknown (05/28/2019)    Social Connection and Isolation Panel [NHANES]     Frequency of Communication with Friends and Family: More than three times a week     Frequency of Social Gatherings with Friends and Family: Twice a week     Attends Religious Services: Patient declined     Database administrator or Organizations: Yes     Attends Banker Meetings: Not on file     Marital Status: Married   Physicist, medical Strain: Low Risk  (08/17/2023)    Overall Financial Resource Strain (CARDIA)     Difficulty of Paying Living Expenses: Not very hard   Depression: Not at risk (05/03/2023)    PHQ-2     PHQ-2 Score: 0   Health Literacy: Low Risk  (03/04/2021)    Health Literacy     : Never       Complex Discharge Information    Is patient identified as a difficult/complex discharge?: No      Discharge Needs Assessment  Concerns to be Addressed: care coordination/care conferences, discharge planning    Clinical Risk Factors: > 65, Principal Diagnosis: Cancer, Stroke, COPD, Heart Failure, AMI, Pneumonia, Joint Replacment    Barriers to taking medications: No    Prior overnight hospital stay or ED visit in last 90 days: Yes    Anticipated Changes Related to Illness: inability to care for self    Equipment Needed After Discharge: other (see comments) (CM to follow)    Discharge Facility/Level of Care Needs: other (see comments) (CM to follow)    Readmission  Risk of Unplanned Readmission Score: UNPLANNED READMISSION SCORE: 29.12%  Predictive Model Details          29% (High)  Factor Value    Calculated 09/14/2023 08:03 24% Number of active  inpatient medication orders 46    [place] Risk of Unplanned Readmission Model 8% Diagnosis of cancer present     7% Active antipsychotic inpatient medication order present     7% ECG/EKG order present in last 6 months     7% Latest calcium low (7.8 mg/dL)     6% Encounter of ten days or longer in last year present     6% Diagnosis of electrolyte disorder present     5% Imaging order present in last 6 months     5% Age 37     5% Latest hemoglobin low (7.5 g/dL)     5% Phosphorous result present     4% Number of ED visits in last six months 1     4% Number of hospitalizations in last year 1     3% Active anticoagulant inpatient medication order present     3% Charlson Comorbidity Index 3     2% Future appointment scheduled     1% Active ulcer inpatient medication order present     1% Current length of stay 0.729 days      Readmitted Within the Last 30 Days? (No if blank) Yes  Patient at risk for readmission?: Yes    Discharge Plan  Screen findings are: Discharge planning needs identified or anticipated (Comment). (Pending medical recommendations)    Expected Discharge Date: 09/14/2023    Expected Transfer from Critical Care:         Patient and/or family were provided with choice of facilities / services that are available and appropriate to meet post hospital care needs?: Yes   List choices in order highest to lowest preferred, if applicable. : [facility] ([facility]) for infusion. No preference for [facility]    Initial Assessment complete?: Yes

## 2023-09-15 LAB — CBC W/ AUTO DIFF
BASOPHILS ABSOLUTE COUNT: 0 10*9/L (ref 0.0–0.1)
BASOPHILS RELATIVE PERCENT: 0.4 %
EOSINOPHILS ABSOLUTE COUNT: 0 10*9/L (ref 0.0–0.5)
EOSINOPHILS RELATIVE PERCENT: 0 %
HEMATOCRIT: 25.5 % — ABNORMAL LOW (ref 39.0–48.0)
HEMOGLOBIN: 9.1 g/dL — ABNORMAL LOW (ref 12.9–16.5)
LYMPHOCYTES ABSOLUTE COUNT: 0.1 10*9/L — ABNORMAL LOW (ref 1.1–3.6)
LYMPHOCYTES RELATIVE PERCENT: 2.1 %
MEAN CORPUSCULAR HEMOGLOBIN CONC: 35.7 g/dL (ref 32.0–36.0)
MEAN CORPUSCULAR HEMOGLOBIN: 34.2 pg — ABNORMAL HIGH (ref 25.9–32.4)
MEAN CORPUSCULAR VOLUME: 95.9 fL — ABNORMAL HIGH (ref 77.6–95.7)
MEAN PLATELET VOLUME: 7.9 fL (ref 6.8–10.7)
MONOCYTES ABSOLUTE COUNT: 1.3 10*9/L — ABNORMAL HIGH (ref 0.3–0.8)
MONOCYTES RELATIVE PERCENT: 29.1 %
NEUTROPHILS ABSOLUTE COUNT: 3.1 10*9/L (ref 1.8–7.8)
NEUTROPHILS RELATIVE PERCENT: 68.4 %
PLATELET COUNT: 116 10*9/L — ABNORMAL LOW (ref 150–450)
RED BLOOD CELL COUNT: 2.66 10*12/L — ABNORMAL LOW (ref 4.26–5.60)
RED CELL DISTRIBUTION WIDTH: 14.5 % (ref 12.2–15.2)
WBC ADJUSTED: 4.5 10*9/L (ref 3.6–11.2)

## 2023-09-15 LAB — POTASSIUM: POTASSIUM: 3.9 mmol/L (ref 3.4–4.8)

## 2023-09-15 LAB — BASIC METABOLIC PANEL
ANION GAP: 9 mmol/L (ref 5–14)
BLOOD UREA NITROGEN: 10 mg/dL (ref 9–23)
BUN / CREAT RATIO: 11
CALCIUM: 8.3 mg/dL — ABNORMAL LOW (ref 8.7–10.4)
CHLORIDE: 105 mmol/L (ref 98–107)
CO2: 23 mmol/L (ref 20.0–31.0)
CREATININE: 0.92 mg/dL
EGFR CKD-EPI (2021) MALE: 86 mL/min/{1.73_m2} (ref >=60)
GLUCOSE RANDOM: 132 mg/dL (ref 70–179)
POTASSIUM: 3 mmol/L — ABNORMAL LOW (ref 3.4–4.8)
SODIUM: 137 mmol/L (ref 135–145)

## 2023-09-15 LAB — MAGNESIUM: MAGNESIUM: 2.1 mg/dL (ref 1.6–2.6)

## 2023-09-15 LAB — TACROLIMUS LEVEL, TROUGH: TACROLIMUS, TROUGH: 9.9 ng/mL (ref 5.0–15.0)

## 2023-09-15 MED ORDER — LOPERAMIDE 2 MG CAPSULE
ORAL_CAPSULE | Freq: Four times a day (QID) | ORAL | 0 refills | 4 days | PRN
Start: 2023-09-15 — End: ?

## 2023-09-15 MED ORDER — ONDANSETRON HCL 8 MG TABLET
ORAL_TABLET | Freq: Three times a day (TID) | ORAL | 2 refills | 10 days | PRN
Start: 2023-09-15 — End: ?

## 2023-09-15 MED ORDER — TACROLIMUS 1 MG CAPSULE, IMMEDIATE-RELEASE
ORAL_CAPSULE | ORAL | 5 refills | 30 days
Start: 2023-09-15 — End: ?

## 2023-09-15 MED ADMIN — prochlorperazine (COMPAZINE) tablet 10 mg: 10 mg | ORAL | @ 12:00:00

## 2023-09-15 MED ADMIN — calcium carbonate (TUMS) chewable tablet 400 mg elem calcium: 400 mg | ORAL

## 2023-09-15 MED ADMIN — potassium chloride ER tablet 60 mEq: 60 meq | ORAL | @ 08:00:00

## 2023-09-15 MED ADMIN — fluconazole (DIFLUCAN) tablet 400 mg: 400 mg | ORAL | @ 12:00:00 | Stop: 2023-10-03

## 2023-09-15 MED ADMIN — tamsulosin (FLOMAX) 24 hr capsule 0.4 mg: .4 mg | ORAL

## 2023-09-15 MED ADMIN — zinc sulfate (ZINCATE) capsule 220 mg: 220 mg | ORAL | @ 12:00:00 | Stop: 2023-09-23

## 2023-09-15 MED ADMIN — mycophenolate (CELLCEPT) tablet 1,000 mg: 1000 mg | ORAL | @ 12:00:00

## 2023-09-15 MED ADMIN — valACYclovir (VALTREX) tablet 500 mg: 500 mg | ORAL | @ 12:00:00

## 2023-09-15 MED ADMIN — loperamide (IMODIUM) capsule 4 mg: 4 mg | ORAL | @ 19:00:00

## 2023-09-15 MED ADMIN — diphenoxylate-atropine (LOMOTIL) 2.5-0.025 mg per tablet 1 tablet: 1 | ORAL

## 2023-09-15 MED ADMIN — letermovir (PREVYMIS) tablet 480 mg: 480 mg | ORAL | @ 12:00:00

## 2023-09-15 MED ADMIN — pantoprazole (Protonix) EC tablet 40 mg: 40 mg | ORAL | @ 12:00:00

## 2023-09-15 MED ADMIN — mycophenolate (CELLCEPT) tablet 1,000 mg: 1000 mg | ORAL | @ 19:00:00

## 2023-09-15 MED ADMIN — loperamide (IMODIUM) capsule 4 mg: 4 mg | ORAL | @ 12:00:00

## 2023-09-15 MED ADMIN — calcium carbonate (TUMS) chewable tablet 400 mg elem calcium: 400 mg | ORAL | @ 12:00:00

## 2023-09-15 MED ADMIN — tacrolimus (PROGRAF) capsule 4 mg: 4 mg | ORAL

## 2023-09-15 MED ADMIN — cyanocobalamin (vitamin B-12) tablet 2,000 mcg: 2000 ug | ORAL | @ 12:00:00

## 2023-09-15 MED ADMIN — loperamide (IMODIUM) capsule 4 mg: 4 mg | ORAL | @ 08:00:00

## 2023-09-15 MED ADMIN — traMADol (ULTRAM) tablet 25 mg: 25 mg | ORAL | @ 04:00:00

## 2023-09-15 MED ADMIN — loratadine (CLARITIN) tablet 10 mg: 10 mg | ORAL | @ 12:00:00

## 2023-09-15 MED ADMIN — metoPROLOL succinate (Toprol-XL) 24 hr tablet 50 mg: 50 mg | ORAL | @ 12:00:00

## 2023-09-15 MED ADMIN — cholecalciferol (vitamin D3 25 mcg (1,000 units)) tablet 25 mcg: 25 ug | ORAL | @ 12:00:00

## 2023-09-15 MED ADMIN — diphenoxylate-atropine (LOMOTIL) 2.5-0.025 mg per tablet 1 tablet: 1 | ORAL | @ 04:00:00

## 2023-09-15 MED ADMIN — tacrolimus (PROGRAF) capsule 3 mg: 3 mg | ORAL | @ 12:00:00 | Stop: 2023-09-15

## 2023-09-15 MED ADMIN — loperamide (IMODIUM) capsule 4 mg: 4 mg | ORAL

## 2023-09-15 MED ADMIN — calcium carbonate (TUMS) chewable tablet 400 mg elem calcium: 400 mg | ORAL | @ 19:00:00

## 2023-09-15 MED ADMIN — mycophenolate (CELLCEPT) tablet 1,000 mg: 1000 mg | ORAL

## 2023-09-15 MED ADMIN — valACYclovir (VALTREX) tablet 500 mg: 500 mg | ORAL

## 2023-09-15 NOTE — Unmapped (Signed)
Tacrolimus Therapeutic Monitoring Pharmacy Note    Timothy Porter is a 77 y.o. male continuing tacrolimus.     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     Date of Transplant:  auto-SCT in 05/2019       Prior Dosing Information: Current regimen tacrolimus 3 mg QAM and 4 mg QHS      Source(s) of information used to determine prior to admission dosing: Clinic Note    Goals:  Therapeutic Drug Levels  Tacrolimus trough goal:  5-10  ng/mL     Additional Clinical Monitoring/Outcomes  Monitor renal function (SCr and urine output) and liver function (LFTs)  Monitor for signs/symptoms of adverse events (e.g., hyperglycemia, hyperkalemia, hypomagnesemia, hypertension, headache, tremor)    Results:   Tacrolimus level: 9.9 ng/mL, drawn appropriately    Pharmacokinetic Considerations and Significant Drug Interactions:  Concurrent hepatotoxic medications:  fluconazole  Concurrent CYP3A4 substrates/inhibitors:  fluconazole, letermovir  Concurrent nephrotoxic medications: None identified    Assessment/Plan:  Recommendedation(s)  Tacrolimus trough within goal range following missed doses (10/16) and dose reduction starting 10/17, but not yet at steady state  Scr improving and almost back to baseline (0.7-0.8 mg/dL)  LFTs WNL as of 16/10  Posaconazole and letermovir at steady state and should not be contributing to increases in tacrolimus level  Given tacrolimus trough at higher end of goal range, will Decrease to tacrolimus 3 mg PO BID (reduced evening dose from 4 mg to 3 mg) starting tonight. Possible discharge tomorrow, so will recollect level tomorrow AM to assess trend given level is almost supratherapeutic.    Follow-up  Next level has been ordered on 09/16/23 at 0800  A pharmacist will continue to monitor and recommend levels as appropriate      Longitudinal Dose Monitoring:  Date Dose (mg), Route AM Scr (mg/dL) Level  (ng/mL) Key Drug Interactions   10/16 -- / -- 1.29 10.1 Fluconazole, letermovir   10/17 5/2 1.06 6.3 Fluconazole, letermovir   10/18 3/4 0.89  Fluconazole, letermovir   10/19 3/3 0.92 9.9 Fluconazole, letermovir       Please page service pharmacist with questions/clarifications.    Etheleen Sia, PharmD  PGY2 Oncology Pharmacy Resident

## 2023-09-15 NOTE — Unmapped (Signed)
BONE MARROW TRANSPLANT AND CELLULAR THERAPY PROGRESS NOTE    Patient Name: Timothy Porter  MRN: [id number]  Encounter Date: 09/15/23    [facility] Myeloma Physician: Dr Vivien Presto Leukemia Physician: Dr [name]  Primary Care Provider: Pcp, None Per Patient  BMT Attending MD: 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  Type of Transplant: RIC MRD Allo  Graft Source: Fresh PBSCs  Transplant Day: 1570 (Autologous PBSC Unknown Phase - Planned 05/29/2019), 24 (Allogeneic PBSC Unknown Phase - Planned 08/22/2023)     HPI:  Timothy Porter is a 77 y.o. 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 His transplant course was complicated by diarrhea (hx of IBS with alternating diarrhea/constipation; C diff neg) and fevers (culture neg, treated with Cefepime). Diarrhea required scheduled Lomotil and Imodium initially, but now controlled with PRN Imodium and patient self-titrating given IBS hx. He is incontinent at times. He also had hypomagnesemia requiring daily IV Mag and discharging with 2g IV Mag and hypokalemia discharging with 40k daily. BP medications were adjusted while admitted as he was HTN then developed orthostatic hypotension. He was discharged on 10mg  daily.     [name] is now admitted following a syncopal episode while being seen in BMT outpatient clinic for routine follow up. Following standing up for a weight pt became nonverbal and unable to follow commands. Per wife his eyes were open and pt was tachypneic. This episode lasted approximately 2 minutes. Following resolution pt states he had no recollection of the event. Vitals were taken following resolution where he was tachycardic and hypotensive. He has not had any episodes since. Reports better fluid intake over the past 48hrs, but reports poor food intake, LOA. Denies nausea/vomiting, states nothing sounds good. Continues to have ongoing diarrhea. Reports stools are still watery for the most part despite Imodium QID. Denies abd pain.      Denies SOB, palpitations, dizziness (with exception of when first sitting/standing and needing to gather himself prior to standing/walking). No new vision changes, cough, upper respiratory sx.    Interval History:  Timothy Porter denies further dizziness.    -no diarrhea since 5pm yesterday   -Afebrile. Cultures neg.   -planning for colonoscopy Monday   -has some minor abdominal cramping   -took PRN lomotil x3 in the past 24h     Review of Systems:  A full system review was performed and was negative except as noted in the above interval history.    Temp:  [36.4 ??C (97.6 ??F)-36.9 ??C (98.5 ??F)] 36.5 ??C (97.7 ??F)  Heart Rate:  [83-102] 90  Resp:  [18-20] 18  BP: (76-130)/(49-72) 124/72  MAP (mmHg):  [58-85] 85  SpO2:  [93 %-98 %] 98 %    I/O last 3 completed shifts:  In: 1494 [P.O.:1170; Blood:324]  Out: 1600 [Urine:1600]  No intake/output data recorded.    Last 5 Recorded Weights    09/12/23 2235 09/13/23 2015   Weight: (!) 116.2 kg (256 lb 2.8 oz) (!) 116.8 kg (257 lb 8 oz)     Weight change:     Test Results:   Reviewed in EPIC. Abnormal values discussed below.     Scheduled Meds:   calcium carbonate  400 mg elem  calcium Oral TID    cholecalciferol (vitamin D3 25 mcg (1,000 units))  25 mcg Oral Daily    cyanocobalamin (vitamin B-12)  2,000 mcg Oral Daily    fluconazole  400 mg Oral Daily    heparin, porcine (PF)  2 mL Intravenous Mon,Wed,Fri    letermovir  480 mg Oral Daily    loperamide  4 mg Oral Q6H    loratadine  10 mg Oral Daily    metoPROLOL succinate  50 mg Oral Daily    mycophenolate  1,000 mg Oral TID    pantoprazole  40 mg Oral Daily    sulfamethoxazole-trimethoprim  1 tablet Oral Mon,Wed,Fri    tacrolimus  3 mg Oral Daily    tacrolimus  4 mg Oral Daily    tamsulosin 0.4 mg Oral Nightly    valACYclovir  500 mg Oral BID    zinc sulfate  220 mg Oral Daily     Continuous Infusions:  PRN Meds:.alum-mag-simeth, CETAPHIL, diphenoxylate-atropine, emollient combination no.92, fluticasone propionate, lidocaine, magnesium sulfate, lidocaine-diphenhydrAMINE-aluminum-magnesium, potassium chloride, potassium chloride, prochlorperazine **OR** prochlorperazine, saliva stimulant comb. no.3, traMADol    Physical Exam:  General : No acute distress noted.   Central venous access: Line clean, dry, intact. No erythema or drainage noted.   ENT: Dry mucous membranes. Oropharhynx without lesions, erythema or exudate.   Cardiovascular: Pulse normal rate, regularity and rhythm. S1 and S2 normal, without any murmur, rub, or gallop.  Lungs: Clear to auscultation bilaterally, without wheezes/crackles/rhonchi. Good air movement.   Skin: Warm, dry, intact. Scattered bruises on arms  Psychiatry: Alert and oriented to person, place, and time.   GI : Normoactive bowel sounds, abdomen soft, non-tender   Extremeties: No edema.   Musculo Skeletal: Full range of motion in shoulder, elbow, hip knee, ankle, left hand and feet.  Neurologic: CNII-XII intact. Normal strength and sensation throughout    Assessment/Plan:    BMT: AML with prior history of MM. AML now in CR MRD Negative  HCT-CI (age adjusted) 2 (age and hx of A-fib)  Conditioning: RIC Flu/Bu      Donor: 8/8, ABO O+, CMV positive  Recipient A+ and CMV+  Engraftment: G-CSF starting Day + 5 through WBC recovery (as defined as ANC 1.0 x 2 days or 3.0 x 1 day)  -Date of last G-CSF injection: 09/06/23      GVHD prophylaxis:   1. Cyclophosphamide 50 mg/kg IV daily on days +3 and +4 with mesna  2. Tacrolimus to goal trough (5-10 ng/mL) starting day +5  3. Mycophenolate mofetil 15 mg/kg PO TID (max 1g PO TID) starting day +5 through day +35      Hem: Transfuse 1 unit of PRBCs for hemoglobin <7 and 1 unit of platelets for Plt <10K or bleeding. [name]. [name] does not have a history of transfusion reactions. Consent was obtained and documented on 08/15/23.     ID:   Prophylaxis:  -Antiviral: Valtrex 500 mg po BID , continue through 2 years post transplant                   Letermovir 480 mg po to start at day +15 and continue through day +200   -Antifungal: Fluconazole 400 mg po daily, continue through D+75  -Antibacterial: none indicated  -PJP: Bactrim DS once daily MWF upon platelet engraftment (started 10/16)      GI:   GERD prophylaxis:  -Home med: Prilosec 40 mg daily. (switched to pantoprazole while inpatient)  Nausea:  -Zofran as needed     Hx of IBS with chronic alternating diarrhea/constipation  -Has managed with changing doses of Questran; increased to 1 packet BID in 02/2023 with improvement of diarrhea; self tapered to 0.5 packets BID  -Reports predominantly constipation concerns but would have occasional accidents; says he hasn't soiled himself in months with current dose  -Uses docusate only if no BM 3-4 days  -Meticulously self tracks and titrates medication  -Continue Questran dosing on admission with prn docusate; likely will need to hold if he develops diarrhea  -10/1: Changing Questran to PRN  -10/2: Cdiff negative  -10/10 Lomotil and Imodium to PRN  -10/14: Having 3-4 loose stools a day, using imodium 4mg  QID PRN. Diarrhea is a long standing issue for him, incontinence is not uncommon will need to assess for changes to BM pattern in clinic  - 09/12/2023: Continues to have watery diarrhea on admission, Imodium 4mg  QID prn. Can consider adding lomotil prn. Last c diff test 10/12, will hold off repeating for now.  -09/13/23: Orders in for GIPP.   -09/14/23: Diarrhea worse today with explosive episodes and he is sometimes incontinent. GIPP pending. Has hx of IBS. Also got a dose of oral mag which could be contributing. Maxing out imodium with lomotil prn. GI consulted for further management.  -10/19: diarrhea resolved. 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 On 10/16 admission Cr 1.29.   - 09/12/2023: Received 1L NS bolus in ED, will FU with admission/daily labs.  -09/13/23: Received an addition liter today.      FEN:  replacement per BMT prn protocol  Home meds: Vitamin D3 25 mcg daily, B12 2000 mcg daily, MV  Hypomagnesemia: 2g IV Mag daily with HH  Hypokalemia: daily  Hypocalcemia: Calcium carbonate 2 tabs TID    Hepatic: Normal bilirubin. SOS prophylaxis not indicated for RIC regimen.     CV:   -Hx of A-fib with transplant in 2020; required diltiazem+metoprolol; resolved post acute episode     * Continues on metoprolol mainly for BP.      * Plan for cardiac electrolytes     -HTN: has discontinued diltiazem prior to the start of venetoclax (for DDI) currently managed with metoprolol monotherapy; decreased to 50 mg/day ~1-2 months ago  -9/20: Adding norvasc 5mg  daily for persistent HTN  -10/12: Increased Amlodipine to 10mg  daily   -09/12/2023: Given syncopal episode/hypotension will hold amlodipine on admission.    Syncopal Episode on 09/12/2023 while been seen in outpatient after standing for weight. Lost consciousness but did not hit head. Poor food intake per pt, improved fluid intake over past 48hrs. Continues to have watery diarrhea despite imodium use.  - 09/12/2023: Cardiac enzymes WNL. EKG showed sinus rhythm with premature atrial beats. Will start on tele. CT head without acute abnormalities, CT chest without acute abnormalities. Blood and urine cultures obtained in ED, FU results. Will hold off on antibiotics given no fevers.   -09/13/23: Reports that he had some dizziness with standing while at home. None so far today. Work up thus far has been negative for cardiac etiology. Likely combination of orthostasis from norvasc which is now being held and dehydration for decreased oral intake and diarrhea losses. Will continue tele x24hrs.   -09/14/23: No further dizziness. Orthostatic BPs ordered for today. PVCs on tele.  Pulm:   10/8 new oxygen requirement (10/9 off oxygen): CXR: shows no acute findings. RPP negative     ENT:  - Chronic cough when supine from PND? Try flonase or saline spray     GU:   Hx of BPH, previously on flomax will restart 10/2 (having some urinary urgency and incontinence due to mobility issues)   Hold sildenafil (for ED)     Endocrine:  Hx of steroid induced hyperglycemia not currently on medications     Neuro/Pain: No issues     Psych: CCSP consult prn.      Deconditioning:  - PT/OT consulted, has OP PT referrals in, has Rolator in his room for discharge  - Uses a cane for walking.   The patient is confined to one room and will need a bedside commode    Carmina Miller, MD  [facility] Marrow Transplant and Cellular Therapy Program

## 2023-09-15 NOTE — Unmapped (Signed)
Problem: Adult Inpatient Plan of Care  Goal: Plan of Care Review  Outcome: Ongoing - Unchanged  Goal: Patient-Specific Goal (Individualized)  Outcome: Ongoing - Unchanged  Goal: Absence of Hospital-Acquired Illness or Injury  Outcome: Ongoing - Unchanged  Intervention: Identify and Manage Fall Risk  Recent Flowsheet Documentation  Taken 09/14/2023 0701 by Harlon Ditty, RN  Safety Interventions:   bleeding precautions   fall reduction program maintained   family at bedside   lighting adjusted for tasks/safety   low bed   neutropenic precautions   nonskid shoes/slippers when out of bed  Intervention: Prevent Skin Injury  Recent Flowsheet Documentation  Taken 09/14/2023 0701 by Harlon Ditty, RN  Positioning for Skin: Supine/Back  Device Skin Pressure Protection: adhesive use limited  Skin Protection: adhesive use limited  Intervention: Prevent Infection  Recent Flowsheet Documentation  Taken 09/14/2023 0701 by Harlon Ditty, RN  Infection Prevention:   cohorting utilized   environmental surveillance performed   equipment surfaces disinfected   hand hygiene promoted   personal protective equipment utilized   rest/sleep promoted   single patient room provided   visitors restricted/screened  Goal: Optimal Comfort and Wellbeing  Outcome: Ongoing - Unchanged  Goal: Readiness for Transition of Care  Outcome: Ongoing - Unchanged  Goal: Rounds/Family Conference  Outcome: Ongoing - Unchanged     Problem: Fall Injury Risk  Goal: Absence of Fall and Fall-Related Injury  Outcome: Ongoing - Unchanged  Intervention: Promote Injury-Free Environment  Recent Flowsheet Documentation  Taken 09/14/2023 0701 by Harlon Ditty, RN  Safety Interventions:   bleeding precautions   fall reduction program maintained   family at bedside   lighting adjusted for tasks/safety   low bed   neutropenic precautions   nonskid shoes/slippers when out of bed     Problem: Pain Acute  Goal: Optimal Pain Control and Function  Outcome: Ongoing - Unchanged     Problem: Comorbidity Management  Goal: Blood Pressure in Desired Range  Outcome: Ongoing - Unchanged     Problem: Infection  Goal: Absence of Infection Signs and Symptoms  Outcome: Ongoing - Unchanged  Intervention: Prevent or Manage Infection  Recent Flowsheet Documentation  Taken 09/14/2023 0701 by Harlon Ditty, RN  Infection Management: aseptic technique maintained  Isolation Precautions:   protective precautions maintained   enteric precautions maintained     Problem: Malnutrition  Goal: Improved Nutritional Intake  Outcome: Ongoing - Unchanged     Problem: Self-Care Deficit  Goal: Improved Ability to Complete Activities of Daily Living  Outcome: Ongoing - Unchanged     Problem: Skin Injury Risk Increased  Goal: Skin Health and Integrity  Outcome: Ongoing - Unchanged  Intervention: Optimize Skin Protection  Recent Flowsheet Documentation  Taken 09/14/2023 0701 by Harlon Ditty, RN  Pressure Reduction Techniques: frequent weight shift encouraged  Head of Bed (HOB) Positioning: HOB elevated  Pressure Reduction Devices: pressure-redistributing mattress utilized  Skin Protection: adhesive use limited

## 2023-09-15 NOTE — Unmapped (Signed)
BMT NOTE: OK TO DISCARD REMAINING  CRYOPRESERVED  AUTOLOGOUS STEM CELL PRODUCTS    As the primary BMT provider for this patient, I hereby authorize the discarding of the remaining AUTOLOGOUS stem cell products that have been stored at [facility] in the [facility]. Please retain and do not discard allogeneic products.    Sabas Sous, MD, MPH  [place] [facility]

## 2023-09-15 NOTE — Unmapped (Signed)
Pt day +24 alloSCT, AO x4 and VSS. PRN lomotil x2 given this shift per pt request. No complaints, of pain, n/v. Potassium replaced per order parameters and recheck ordered. Did not require any blood products.     Problem: Adult Inpatient Plan of Care  Goal: Plan of Care Review  Outcome: Ongoing - Unchanged  Goal: Patient-Specific Goal (Individualized)  Outcome: Ongoing - Unchanged  Goal: Absence of Hospital-Acquired Illness or Injury  Outcome: Ongoing - Unchanged  Intervention: Identify and Manage Fall Risk  Recent Flowsheet Documentation  Taken 09/14/2023 2010 by Mathis Dad, RN  Safety Interventions:   bleeding precautions   environmental modification   fall reduction program maintained   infection management   isolation precautions   lighting adjusted for tasks/safety   low bed   neutropenic precautions   nonskid shoes/slippers when out of bed  Intervention: Prevent Skin Injury  Recent Flowsheet Documentation  Taken 09/14/2023 2010 by Mathis Dad, RN  Positioning for Skin: Supine/Back  Device Skin Pressure Protection: adhesive use limited  Skin Protection:   adhesive use limited   transparent dressing maintained  Intervention: Prevent and Manage VTE (Venous Thromboembolism) Risk  Recent Flowsheet Documentation  Taken 09/14/2023 2010 by Mathis Dad, RN  VTE Prevention/Management:   ambulation promoted   fluids promoted  Anti-Embolism Intervention: Off  Intervention: Prevent Infection  Recent Flowsheet Documentation  Taken 09/14/2023 2010 by Mathis Dad, RN  Infection Prevention:   environmental surveillance performed   equipment surfaces disinfected   hand hygiene promoted   personal protective equipment utilized   rest/sleep promoted   single patient room provided   visitors restricted/screened  Goal: Optimal Comfort and Wellbeing  Outcome: Ongoing - Unchanged  Goal: Readiness for Transition of Care  Outcome: Ongoing - Unchanged  Goal: Rounds/Family Conference  Outcome: Ongoing - Unchanged     Problem: Fall Injury Risk  Goal: Absence of Fall and Fall-Related Injury  Outcome: Ongoing - Unchanged  Intervention: Identify and Manage Contributors  Recent Flowsheet Documentation  Taken 09/14/2023 2010 by Mathis Dad, RN  Self-Care Promotion: independence encouraged  Intervention: Promote Injury-Free Environment  Recent Flowsheet Documentation  Taken 09/14/2023 2010 by Mathis Dad, RN  Safety Interventions:   bleeding precautions   environmental modification   fall reduction program maintained   infection management   isolation precautions   lighting adjusted for tasks/safety   low bed   neutropenic precautions   nonskid shoes/slippers when out of bed     Problem: Pain Acute  Goal: Optimal Pain Control and Function  Outcome: Ongoing - Unchanged     Problem: Comorbidity Management  Goal: Blood Pressure in Desired Range  Outcome: Ongoing - Unchanged     Problem: Infection  Goal: Absence of Infection Signs and Symptoms  Outcome: Ongoing - Unchanged  Intervention: Prevent or Manage Infection  Recent Flowsheet Documentation  Taken 09/14/2023 2010 by Mathis Dad, RN  Infection Management: aseptic technique maintained  Isolation Precautions: protective precautions maintained     Problem: Malnutrition  Goal: Improved Nutritional Intake  Outcome: Ongoing - Unchanged  Intervention: Promote and Optimize Oral Intake  Recent Flowsheet Documentation  Taken 09/14/2023 2010 by Mathis Dad, RN  Oral Nutrition Promotion: calorie-dense foods provided     Problem: Self-Care Deficit  Goal: Improved Ability to Complete Activities of Daily Living  Outcome: Ongoing - Unchanged  Intervention: Promote Activity and Functional Independence  Recent Flowsheet Documentation  Taken 09/14/2023 2010 by Mathis Dad, RN  Self-Care Promotion: independence encouraged     Problem: Skin  Injury Risk Increased  Goal: Skin Health and Integrity  Outcome: Ongoing - Unchanged  Intervention: Optimize Skin Protection  Recent Flowsheet Documentation  Taken 09/14/2023 2010 by Mathis Dad, RN  Activity Management: up ad lib  Pressure Reduction Techniques: frequent weight shift encouraged  Pressure Reduction Devices: pressure-redistributing mattress utilized  Skin Protection:   adhesive use limited   transparent dressing maintained  Intervention: Promote and Optimize Oral Intake  Recent Flowsheet Documentation  Taken 09/14/2023 2010 by Mathis Dad, RN  Oral Nutrition Promotion: calorie-dense foods provided

## 2023-09-16 LAB — TACROLIMUS LEVEL, TROUGH: TACROLIMUS, TROUGH: 7.9 ng/mL (ref 5.0–15.0)

## 2023-09-16 LAB — BASIC METABOLIC PANEL
ANION GAP: 5 mmol/L (ref 5–14)
BLOOD UREA NITROGEN: 14 mg/dL (ref 9–23)
BUN / CREAT RATIO: 15
CALCIUM: 8.1 mg/dL — ABNORMAL LOW (ref 8.7–10.4)
CHLORIDE: 107 mmol/L (ref 98–107)
CO2: 27 mmol/L (ref 20.0–31.0)
CREATININE: 0.93 mg/dL
EGFR CKD-EPI (2021) MALE: 85 mL/min/{1.73_m2} (ref >=60)
GLUCOSE RANDOM: 121 mg/dL (ref 70–179)
POTASSIUM: 3.7 mmol/L (ref 3.4–4.8)
SODIUM: 139 mmol/L (ref 135–145)

## 2023-09-16 LAB — MAGNESIUM: MAGNESIUM: 1.5 mg/dL — ABNORMAL LOW (ref 1.6–2.6)

## 2023-09-16 LAB — CBC W/ AUTO DIFF
BASOPHILS ABSOLUTE COUNT: 0 10*9/L (ref 0.0–0.1)
BASOPHILS RELATIVE PERCENT: 0.4 %
EOSINOPHILS ABSOLUTE COUNT: 0 10*9/L (ref 0.0–0.5)
EOSINOPHILS RELATIVE PERCENT: 0 %
HEMATOCRIT: 24.9 % — ABNORMAL LOW (ref 39.0–48.0)
HEMOGLOBIN: 8.9 g/dL — ABNORMAL LOW (ref 12.9–16.5)
LYMPHOCYTES ABSOLUTE COUNT: 0.1 10*9/L — ABNORMAL LOW (ref 1.1–3.6)
LYMPHOCYTES RELATIVE PERCENT: 1.6 %
MEAN CORPUSCULAR HEMOGLOBIN CONC: 35.8 g/dL (ref 32.0–36.0)
MEAN CORPUSCULAR HEMOGLOBIN: 34.1 pg — ABNORMAL HIGH (ref 25.9–32.4)
MEAN CORPUSCULAR VOLUME: 95.3 fL (ref 77.6–95.7)
MEAN PLATELET VOLUME: 8.2 fL (ref 6.8–10.7)
MONOCYTES ABSOLUTE COUNT: 1.1 10*9/L — ABNORMAL HIGH (ref 0.3–0.8)
MONOCYTES RELATIVE PERCENT: 20.3 %
NEUTROPHILS ABSOLUTE COUNT: 4.1 10*9/L (ref 1.8–7.8)
NEUTROPHILS RELATIVE PERCENT: 77.7 %
PLATELET COUNT: 123 10*9/L — ABNORMAL LOW (ref 150–450)
RED BLOOD CELL COUNT: 2.61 10*12/L — ABNORMAL LOW (ref 4.26–5.60)
RED CELL DISTRIBUTION WIDTH: 14.8 % (ref 12.2–15.2)
WBC ADJUSTED: 5.3 10*9/L (ref 3.6–11.2)

## 2023-09-16 MED ORDER — DIPHENOXYLATE-ATROPINE 2.5 MG-0.025 MG TABLET
ORAL_TABLET | Freq: Four times a day (QID) | ORAL | 0 refills | 3 days | Status: CP | PRN
Start: 2023-09-16 — End: ?
  Filled 2023-09-16: qty 12, 3d supply, fill #0

## 2023-09-16 MED ORDER — LOPERAMIDE 2 MG CAPSULE
ORAL_CAPSULE | Freq: Four times a day (QID) | ORAL | 0 refills | 4 days | Status: CP
Start: 2023-09-16 — End: ?
  Filled 2023-09-16: qty 30, 4d supply, fill #0

## 2023-09-16 MED ORDER — ONDANSETRON HCL 8 MG TABLET
ORAL_TABLET | Freq: Three times a day (TID) | ORAL | 2 refills | 10 days | Status: CP | PRN
Start: 2023-09-16 — End: ?
  Filled 2023-09-16: qty 30, 10d supply, fill #0

## 2023-09-16 MED ADMIN — loperamide (IMODIUM) capsule 4 mg: 4 mg | ORAL | @ 13:00:00 | Stop: 2023-09-16

## 2023-09-16 MED ADMIN — potassium chloride ER tablet 40 mEq: 40 meq | ORAL | @ 13:00:00 | Stop: 2023-09-16

## 2023-09-16 MED ADMIN — pantoprazole (Protonix) EC tablet 40 mg: 40 mg | ORAL | @ 13:00:00 | Stop: 2023-09-16

## 2023-09-16 MED ADMIN — valACYclovir (VALTREX) tablet 500 mg: 500 mg | ORAL

## 2023-09-16 MED ADMIN — magnesium sulfate 2gm/50mL IVPB: 2 g | INTRAVENOUS | @ 07:00:00 | Stop: 2023-09-16

## 2023-09-16 MED ADMIN — tacrolimus (PROGRAF) capsule 3 mg: 3 mg | ORAL

## 2023-09-16 MED ADMIN — fluconazole (DIFLUCAN) tablet 400 mg: 400 mg | ORAL | @ 13:00:00 | Stop: 2023-09-16

## 2023-09-16 MED ADMIN — magnesium sulfate 2gm/50mL IVPB: 2 g | INTRAVENOUS | @ 09:00:00 | Stop: 2023-09-16

## 2023-09-16 MED ADMIN — loperamide (IMODIUM) capsule 4 mg: 4 mg | ORAL

## 2023-09-16 MED ADMIN — cholecalciferol (vitamin D3 25 mcg (1,000 units)) tablet 25 mcg: 25 ug | ORAL | @ 13:00:00 | Stop: 2023-09-16

## 2023-09-16 MED ADMIN — cyanocobalamin (vitamin B-12) tablet 2,000 mcg: 2000 ug | ORAL | @ 13:00:00 | Stop: 2023-09-16

## 2023-09-16 MED ADMIN — valACYclovir (VALTREX) tablet 500 mg: 500 mg | ORAL | @ 13:00:00 | Stop: 2023-09-16

## 2023-09-16 MED ADMIN — tamsulosin (FLOMAX) 24 hr capsule 0.4 mg: .4 mg | ORAL

## 2023-09-16 MED ADMIN — mycophenolate (CELLCEPT) tablet 1,000 mg: 1000 mg | ORAL

## 2023-09-16 MED ADMIN — calcium carbonate (TUMS) chewable tablet 400 mg elem calcium: 400 mg | ORAL

## 2023-09-16 MED ADMIN — metoPROLOL succinate (Toprol-XL) 24 hr tablet 50 mg: 50 mg | ORAL | @ 13:00:00 | Stop: 2023-09-16

## 2023-09-16 MED ADMIN — calcium carbonate (TUMS) chewable tablet 400 mg elem calcium: 400 mg | ORAL | @ 13:00:00 | Stop: 2023-09-16

## 2023-09-16 MED ADMIN — loratadine (CLARITIN) tablet 10 mg: 10 mg | ORAL | @ 13:00:00 | Stop: 2023-09-16

## 2023-09-16 MED ADMIN — zinc sulfate (ZINCATE) capsule 220 mg: 220 mg | ORAL | @ 13:00:00 | Stop: 2023-09-16

## 2023-09-16 MED ADMIN — prochlorperazine (COMPAZINE) tablet 10 mg: 10 mg | ORAL | @ 13:00:00 | Stop: 2023-09-16

## 2023-09-16 MED ADMIN — letermovir (PREVYMIS) tablet 480 mg: 480 mg | ORAL | @ 13:00:00 | Stop: 2023-09-16

## 2023-09-16 MED ADMIN — tacrolimus (PROGRAF) capsule 3 mg: 3 mg | ORAL | @ 13:00:00 | Stop: 2023-09-16

## 2023-09-16 MED ADMIN — mycophenolate (CELLCEPT) tablet 1,000 mg: 1000 mg | ORAL | @ 13:00:00 | Stop: 2023-09-16

## 2023-09-16 MED ADMIN — loperamide (IMODIUM) capsule 4 mg: 4 mg | ORAL | @ 07:00:00 | Stop: 2023-09-16

## 2023-09-16 NOTE — Unmapped (Signed)
Patient AOx4, VSS, afebrile. Patient received 1 PRN dose of Compazine for nausea with moderate relief.  No falls or acute events this shift. Discharge instructions provided allowing time for questions.  Patient discharged home with wife.      Problem: Adult Inpatient Plan of Care  Goal: Plan of Care Review  Outcome: Resolved  Goal: Patient-Specific Goal (Individualized)  Outcome: Resolved  Goal: Absence of Hospital-Acquired Illness or Injury  Outcome: Resolved  Intervention: Identify and Manage Fall Risk  Recent Flowsheet Documentation  Taken 09/16/2023 0728 by [place] Constable, RN  Safety Interventions:   fall reduction program maintained   infection management   isolation precautions   lighting adjusted for tasks/safety   low bed   neutropenic precautions   nonskid shoes/slippers when out of bed  Intervention: Prevent Skin Injury  Recent Flowsheet Documentation  Taken 09/16/2023 0905 by Arizona Constable, RN  Positioning for Skin: Supine/Back  Taken 09/16/2023 0728 by Arizona Constable, RN  Positioning for Skin: Supine/Back  Intervention: Prevent Infection  Recent Flowsheet Documentation  Taken 09/16/2023 1610 by Arizona Constable, RN  Infection Prevention: cohorting utilized  Goal: Optimal Comfort and Wellbeing  Outcome: Resolved  Goal: Readiness for Transition of Care  Outcome: Resolved  Goal: Rounds/Family Conference  Outcome: Resolved     Problem: Fall Injury Risk  Goal: Absence of Fall and Fall-Related Injury  Outcome: Resolved  Intervention: Promote Injury-Free Environment  Recent Flowsheet Documentation  Taken 09/16/2023 0728 by Arizona Constable, RN  Safety Interventions:   fall reduction program maintained   infection management   isolation precautions   lighting adjusted for tasks/safety   low bed   neutropenic precautions   nonskid shoes/slippers when out of bed     Problem: Pain Acute  Goal: Optimal Pain Control and Function  Outcome: Resolved     Problem: Comorbidity Management  Goal: Blood Pressure in Desired Range  Outcome: Resolved     Problem: Infection  Goal: Absence of Infection Signs and Symptoms  Outcome: Resolved  Intervention: Prevent or Manage Infection  Recent Flowsheet Documentation  Taken 09/16/2023 0728 by Arizona Constable, RN  Infection Management: aseptic technique maintained  Isolation Precautions: protective precautions maintained     Problem: Malnutrition  Goal: Improved Nutritional Intake  Outcome: Resolved     Problem: Self-Care Deficit  Goal: Improved Ability to Complete Activities of Daily Living  Outcome: Resolved     Problem: Skin Injury Risk Increased  Goal: Skin Health and Integrity  Outcome: Resolved  Intervention: Optimize Skin Protection  Recent Flowsheet Documentation  Taken 09/16/2023 0905 by Arizona Constable, RN  Pressure Reduction Techniques: frequent weight shift encouraged  Taken 09/16/2023 9604 by Arizona Constable, RN  Activity Management: up ad lib  Pressure Reduction Techniques: frequent weight shift encouraged  Head of Bed (HOB) Positioning: HOB at 20-30 degrees

## 2023-09-16 NOTE — Unmapped (Signed)
Day +24 allo SCT. Given prn compazine x1 for nausea before meds. Pt reported one soft bowel movement. Reported an improvement from yesterday. Dressing changed due to edges lifted. Tele discontinued. Patient AOx4, VSS, afebrile. No falls or acute events this shift.     Problem: Adult Inpatient Plan of Care  Goal: Plan of Care Review  Outcome: Ongoing - Unchanged  Goal: Patient-Specific Goal (Individualized)  Outcome: Ongoing - Unchanged  Goal: Absence of Hospital-Acquired Illness or Injury  Outcome: Ongoing - Unchanged  Intervention: Identify and Manage Fall Risk  Recent Flowsheet Documentation  Taken 09/15/2023 0715 by Ananias Pilgrim, RN  Safety Interventions:   bleeding precautions   environmental modification   fall reduction program maintained   infection management   isolation precautions   lighting adjusted for tasks/safety   low bed   neutropenic precautions   nonskid shoes/slippers when out of bed  Intervention: Prevent Skin Injury  Recent Flowsheet Documentation  Taken 09/15/2023 0715 by Ananias Pilgrim, RN  Positioning for Skin: Supine/Back  Device Skin Pressure Protection: adhesive use limited  Skin Protection:   adhesive use limited   transparent dressing maintained  Intervention: Prevent Infection  Recent Flowsheet Documentation  Taken 09/15/2023 0715 by Ananias Pilgrim, RN  Infection Prevention:   cohorting utilized   environmental surveillance performed   equipment surfaces disinfected   personal protective equipment utilized   hand hygiene promoted   rest/sleep promoted   single patient room provided   visitors restricted/screened  Goal: Optimal Comfort and Wellbeing  Outcome: Ongoing - Unchanged  Goal: Readiness for Transition of Care  Outcome: Ongoing - Unchanged  Goal: Rounds/Family Conference  Outcome: Ongoing - Unchanged     Problem: Fall Injury Risk  Goal: Absence of Fall and Fall-Related Injury  Outcome: Ongoing - Unchanged  Intervention: Promote Injury-Free Environment  Recent Flowsheet Documentation  Taken 09/15/2023 0715 by Ananias Pilgrim, RN  Safety Interventions:   bleeding precautions   environmental modification   fall reduction program maintained   infection management   isolation precautions   lighting adjusted for tasks/safety   low bed   neutropenic precautions   nonskid shoes/slippers when out of bed     Problem: Pain Acute  Goal: Optimal Pain Control and Function  Outcome: Ongoing - Unchanged     Problem: Comorbidity Management  Goal: Blood Pressure in Desired Range  Outcome: Ongoing - Unchanged     Problem: Infection  Goal: Absence of Infection Signs and Symptoms  Outcome: Ongoing - Unchanged  Intervention: Prevent or Manage Infection  Recent Flowsheet Documentation  Taken 09/15/2023 0715 by Ananias Pilgrim, RN  Infection Management: aseptic technique maintained  Isolation Precautions: protective precautions maintained     Problem: Malnutrition  Goal: Improved Nutritional Intake  Outcome: Ongoing - Unchanged     Problem: Self-Care Deficit  Goal: Improved Ability to Complete Activities of Daily Living  Outcome: Ongoing - Unchanged     Problem: Skin Injury Risk Increased  Goal: Skin Health and Integrity  Outcome: Ongoing - Unchanged  Intervention: Optimize Skin Protection  Recent Flowsheet Documentation  Taken 09/15/2023 0715 by Ananias Pilgrim, RN  Pressure Reduction Techniques: frequent weight shift encouraged  Pressure Reduction Devices: pressure-redistributing mattress utilized  Skin Protection:   adhesive use limited   transparent dressing maintained

## 2023-09-16 NOTE — Unmapped (Signed)
Pt day +25 alloSCT, AO x4 and VSS on RA. No complaints of pain or nausea overnight. Declined assistance repositioning, reminded pt of importance of self-turning to prevent skin breakdown. Magnesium replaced per order parameters. No BM this shift.    Problem: Adult Inpatient Plan of Care  Goal: Plan of Care Review  Outcome: Ongoing - Unchanged  Goal: Patient-Specific Goal (Individualized)  Outcome: Ongoing - Unchanged  Goal: Absence of Hospital-Acquired Illness or Injury  Outcome: Ongoing - Unchanged  Intervention: Identify and Manage Fall Risk  Recent Flowsheet Documentation  Taken 09/15/2023 2015 by Mathis Dad, RN  Safety Interventions:   bleeding precautions   environmental modification   fall reduction program maintained   infection management   isolation precautions   lighting adjusted for tasks/safety   low bed   neutropenic precautions   nonskid shoes/slippers when out of bed  Intervention: Prevent Skin Injury  Recent Flowsheet Documentation  Taken 09/16/2023 0005 by Mathis Dad, RN  Positioning for Skin: Supine/Back  Taken 09/15/2023 2015 by Mathis Dad, RN  Positioning for Skin: Supine/Back  Device Skin Pressure Protection: adhesive use limited  Skin Protection:   adhesive use limited   transparent dressing maintained  Intervention: Prevent and Manage VTE (Venous Thromboembolism) Risk  Recent Flowsheet Documentation  Taken 09/15/2023 2015 by Mathis Dad, RN  VTE Prevention/Management:   ambulation promoted   fluids promoted  Anti-Embolism Intervention: Off  Intervention: Prevent Infection  Recent Flowsheet Documentation  Taken 09/15/2023 2015 by Mathis Dad, RN  Infection Prevention:   environmental surveillance performed   equipment surfaces disinfected   hand hygiene promoted   personal protective equipment utilized   rest/sleep promoted   single patient room provided   visitors restricted/screened  Goal: Optimal Comfort and Wellbeing  Outcome: Ongoing - Unchanged  Goal: Readiness for Transition of Care  Outcome: Ongoing - Unchanged  Goal: Rounds/Family Conference  Outcome: Ongoing - Unchanged     Problem: Fall Injury Risk  Goal: Absence of Fall and Fall-Related Injury  Outcome: Ongoing - Unchanged  Intervention: Promote Injury-Free Environment  Recent Flowsheet Documentation  Taken 09/15/2023 2015 by Mathis Dad, RN  Safety Interventions:   bleeding precautions   environmental modification   fall reduction program maintained   infection management   isolation precautions   lighting adjusted for tasks/safety   low bed   neutropenic precautions   nonskid shoes/slippers when out of bed     Problem: Pain Acute  Goal: Optimal Pain Control and Function  Outcome: Ongoing - Unchanged     Problem: Comorbidity Management  Goal: Blood Pressure in Desired Range  Outcome: Ongoing - Unchanged     Problem: Infection  Goal: Absence of Infection Signs and Symptoms  Outcome: Ongoing - Unchanged  Intervention: Prevent or Manage Infection  Recent Flowsheet Documentation  Taken 09/15/2023 2015 by Mathis Dad, RN  Infection Management: aseptic technique maintained  Isolation Precautions: protective precautions maintained     Problem: Malnutrition  Goal: Improved Nutritional Intake  Outcome: Ongoing - Unchanged  Intervention: Promote and Optimize Oral Intake  Recent Flowsheet Documentation  Taken 09/15/2023 2015 by Mathis Dad, RN  Oral Nutrition Promotion: calorie-dense foods provided     Problem: Self-Care Deficit  Goal: Improved Ability to Complete Activities of Daily Living  Outcome: Ongoing - Unchanged     Problem: Skin Injury Risk Increased  Goal: Skin Health and Integrity  Outcome: Ongoing - Unchanged  Intervention: Optimize Skin Protection  Recent Flowsheet Documentation  Taken 09/15/2023 2015 by Mathis Dad, RN  Activity Management: up ad lib  Pressure Reduction Techniques: frequent weight shift encouraged  Pressure Reduction Devices: pressure-redistributing mattress utilized  Skin Protection:   adhesive use limited transparent dressing maintained  Intervention: Promote and Optimize Oral Intake  Recent Flowsheet Documentation  Taken 09/15/2023 2015 by Mathis Dad, RN  Oral Nutrition Promotion: calorie-dense foods provided

## 2023-09-16 NOTE — Unmapped (Signed)
[facility] Gastro[facility]ology Consult Service   Progress Note         Assessment and Recommendations:   OSEI GORSKY is a 77 y.o. 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 The patient is seen in consultation at the request of Margretta Ditty, MD ([facility] (MDT)) for  acute diarrhea .    Acute Diarrhea (Improving)  Mr. Cabreros is a 77 yo M with recent stem cell transplant one month ago for AML who presented for syncope, AKI, and worsening diarrhea. During his admission for SCT, he was having 3-4 bowel movements per day that were well managed with imodium. C diff negative X2 on 10/2 and 10/12. Patient was reporting eight brown watery bowel movements per day on admission. Stool frequency has improved significantly over the past 48 hours with uptitration of anti-diarrheals. He had one bowel movement yesterday without associated abdominal pain, nausea, or vomiting. We were initially planning for colonoscopy on 10/21, but given overall improvement, we will hold off at this time. Patient likely discharging later today and we discussed continuing scheduled imodium and using lomotil PRN for breakthrough symptoms. If he's not having a bowel movement every 1-2 days, imodium frequency can be decreased to two to three times daily.     Recommendations:  -- Continue scheduled imodium 4 mg q6hrs at discharge. If not having a bowel movement every 1-2 days, decrease frequency to two or three times daily.   -- Continue PRN lomotil 2 tablets 4X/day PRN for breakthrough symptoms  -- Repeat colonoscopy for screening can be discussed with patient's PCP given age >30. Consider overall health, prior screening history, and patient preferences.     Issues Impacting Complexity of Management:  -None    Recommendations discussed with the patient's primary team. We will sign-off at this time, please re-contact if additional questions or a new need for consultation arises.    Subjective:     Mr. Mayernik reports overall improvement with uptitration of antidiarrheals. His last bowel movement was yesterday evening. He denies abdominal pain, nausea, and vomiting.     Objective:   Temp:  [36.5 ??C (97.7 ??F)-36.8 ??C (98.2 ??F)] 36.6 ??C (97.9 ??F)  Heart Rate:  [84-98] 98  Resp:  [18] 18  BP: (94-146)/(59-77) 137/67  SpO2:  [91 %-98 %] 97 %    Gen: Well appearing male in NAD, answers questions appropriately, wife at bedside  Lungs: Breathing comfortably on room air  Psych: Alert, oriented, normal mood and affect.     Pertinent Labs/Studies:  -I have reviewed the patient's labs from 10/20 which show stable Hgb, stable renal function (SCr, electrolytes), and hypomagnesemia      Colonoscopy 12/2017  - One 2 mm polyp in the cecum, removed with a cold biopsy forceps. Resected and retrieved.  - Diverticulosis in the sigmoid colon.  - The examined portion of the ileum was normal.  - Internal hemorrhoids.

## 2023-09-16 NOTE — Unmapped (Signed)
B[facility] TRANSPLANT AND CELLULAR THERAPY PROGRESS NOTE    Patient Name: Timothy Porter  MRN: [id number]  Encounter Date: 09/16/23    [facility] Myeloma Physician: Dr Vivien Presto Leukemia Physician: Dr [name]  Primary Care Provider: Pcp, None Per Patient  BMT Attending MD: 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  Type of Transplant: RIC MRD Allo  Graft Source: Fresh PBSCs  Transplant Day: 1571 (Autologous PBSC Unknown Phase - Planned 05/29/2019), 25 (Allogeneic PBSC Unknown Phase - Planned 08/22/2023)     HPI:  Timothy Porter is a 77 y.o. 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 His transplant course was complicated by diarrhea (hx of IBS with alternating diarrhea/constipation; C diff neg) and fevers (culture neg, treated with Cefepime). Diarrhea required scheduled Lomotil and Imodium initially, but now controlled with PRN Imodium and patient self-titrating given IBS hx. He is incontinent at times. He also had hypomagnesemia requiring daily IV Mag and discharging with 2g IV Mag and hypokalemia discharging with 40k daily. BP medications were adjusted while admitted as he was HTN then developed orthostatic hypotension. He was discharged on 10mg  daily.     Timothy Porter is now admitted following a syncopal episode while being seen in BMT outpatient clinic for routine follow up. Following standing up for a weight pt became nonverbal and unable to follow commands. Per wife his eyes were open and pt was tachypneic. This episode lasted approximately 2 minutes. Following resolution pt states he had no recollection of the event. Vitals were taken following resolution where he was tachycardic and hypotensive. He has not had any episodes since. Reports better fluid intake over the past 48hrs, but reports poor food intake, LOA. Denies nausea/vomiting, states nothing sounds good. Continues to have ongoing diarrhea. Reports stools are still watery for the most part despite Imodium QID. Denies abd pain.      Denies SOB, palpitations, dizziness (with exception of when first sitting/standing and needing to gather himself prior to standing/walking). No new vision changes, cough, upper respiratory sx.    Interval History:  Timothy Porter denies further dizziness.    -no diarrhea since Friday   -1 soft and formed bowel movement yesterday      Review of Systems:  A full system review was performed and was negative except as noted in the above interval history.    Temp:  [36.5 ??C (97.7 ??F)-36.8 ??C (98.2 ??F)] 36.6 ??C (97.9 ??F)  Heart Rate:  [84-98] 98  Resp:  [18] 18  BP: (94-146)/(59-77) 137/67  MAP (mmHg):  [69-94] 86  SpO2:  [91 %-98 %] 97 %    I/O last 3 completed shifts:  In: 780 [P.O.:780]  Out: 1450 [Urine:1450]  I/O this shift:  In: -   Out: 350 [Urine:350]    Last 5 Recorded Weights    09/12/23 2235 09/13/23 2015 09/15/23 2025   Weight: (!) 116.2 kg (256 lb 2.8 oz) (!) 116.8 kg (257 lb 8 oz) (!) 112.5 kg (248 lb 0.3 oz)     Weight change:     Test Results:   Reviewed in EPIC. Abnormal values discussed below.     Scheduled Meds:   calcium carbonate  400 mg elem calcium Oral TID  cholecalciferol (vitamin D3 25 mcg (1,000 units))  25 mcg Oral Daily    cyanocobalamin (vitamin B-12)  2,000 mcg Oral Daily    fluconazole  400 mg Oral Daily    heparin, porcine (PF)  2 mL Intravenous Mon,Wed,Fri    letermovir  480 mg Oral Daily    loperamide  4 mg Oral Q6H    loratadine  10 mg Oral Daily    metoPROLOL succinate  50 mg Oral Daily    mycophenolate  1,000 mg Oral TID    pantoprazole  40 mg Oral Daily    potassium chloride  40 mEq Oral Daily    sulfamethoxazole-trimethoprim  1 tablet Oral Mon,Wed,Fri    tacrolimus  3 mg Oral BID    valACYclovir  500 mg Oral BID    zinc sulfate  220 mg Oral Daily     Continuous Infusions:  PRN Meds:.alum-mag-simeth, CETAPHIL, diphenoxylate-atropine, emollient combination no.92, fluticasone propionate, lidocaine, magnesium sulfate, lidocaine-diphenhydrAMINE-aluminum-magnesium, potassium chloride, potassium chloride, prochlorperazine **OR** prochlorperazine, saliva stimulant comb. no.3, traMADol    Physical Exam:  General : No acute distress noted.   Central venous access: Line clean, dry, intact. No erythema or drainage noted.   ENT: Dry mucous membranes. Oropharhynx without lesions, erythema or exudate.   Cardiovascular: Pulse normal rate, regularity and rhythm. S1 and S2 normal, without any murmur, rub, or gallop.  Lungs: Clear to auscultation bilaterally, without wheezes/crackles/rhonchi. Good air movement.   Skin: Warm, dry, intact. Scattered bruises on arms  Psychiatry: Alert and oriented to person, place, and time.   GI : Normoactive bowel sounds, abdomen soft, non-tender   Extremeties: No edema.   Musculo Skeletal: Full range of motion in shoulder, elbow, hip knee, ankle, left hand and feet.  Neurologic: CNII-XII intact. Normal strength and sensation throughout    Assessment/Plan:    BMT: AML with prior history of MM. AML now in CR MRD Negative  HCT-CI (age adjusted) 2 (age and hx of A-fib)  Conditioning: RIC Flu/Bu      Donor: 8/8, ABO O+, CMV positive  Recipient A+ and CMV+  Engraftment: G-CSF starting Day + 5 through WBC recovery (as defined as ANC 1.0 x 2 days or 3.0 x 1 day)  -Date of last G-CSF injection: 09/06/23      GVHD prophylaxis:   1. Cyclophosphamide 50 mg/kg IV daily on days +3 and +4 with mesna  2. Tacrolimus to goal trough (5-10 ng/mL) starting day +5  3. Mycophenolate mofetil 15 mg/kg PO TID (max 1g PO TID) starting day +5 through day +35      Hem: Transfuse 1 unit of PRBCs for hemoglobin <7 and 1 unit of platelets for Plt <10K or bleeding. Timothy Porter. Timothy Porter does not have a history of transfusion reactions. Consent was obtained and documented on 08/15/23.     ID:   Prophylaxis:  -Antiviral: Valtrex 500 mg po BID , continue through 2 years post transplant                   Letermovir 480 mg po to start at day +15 and continue through day +200   -Antifungal: Fluconazole 400 mg po daily, continue through D+75  -Antibacterial: none indicated  -PJP: Bactrim DS once daily MWF upon platelet engraftment (started 10/16)      GI:   GERD prophylaxis:  -Home med: Prilosec 40 mg daily. (switched to pantoprazole while inpatient)     Nausea:  -Zofran as needed     Hx of IBS with  chronic alternating diarrhea/constipation  -Has managed with changing doses of Questran; increased to 1 packet BID in 02/2023 with improvement of diarrhea; self tapered to 0.5 packets BID  -Reports predominantly constipation concerns but would have occasional accidents; says he hasn't soiled himself in months with current dose  -Uses docusate only if no BM 3-4 days  -Meticulously self tracks and titrates medication  -Continue Questran dosing on admission with prn docusate; likely will need to hold if he develops diarrhea  -10/1: Changing Questran to PRN  -10/2: Cdiff negative  -10/10 Lomotil and Imodium to PRN  -10/14: Having 3-4 loose stools a day, using imodium 4mg  QID PRN. Diarrhea is a long standing issue for him, incontinence is not uncommon will need to assess for changes to BM pattern in clinic  - 09/12/2023: Continues to have watery diarrhea on admission, Imodium 4mg  QID prn. Can consider adding lomotil prn. Last c diff test 10/12, will hold off repeating for now.  -09/13/23: Orders in for GIPP.   -09/14/23: Diarrhea worse today with explosive episodes and he is sometimes incontinent. GIPP pending. Has hx of IBS. Also got a dose of oral mag which could be contributing. Maxing out imodium with lomotil prn. GI consulted for further management.  -10/20: diarrhea resolved. 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 On 10/16 admission Cr 1.29.   - 09/12/2023: Received 1L NS bolus in ED, will FU with admission/daily labs.  -09/13/23: Received an addition liter today.      FEN:  replacement per BMT prn protocol  Home meds: Vitamin D3 25 mcg daily, B12 2000 mcg daily, MV  Hypomagnesemia: 2g IV Mag daily with HH  Hypokalemia: daily  Hypocalcemia: Calcium carbonate 2 tabs TID    Hepatic: Normal bilirubin. SOS prophylaxis not indicated for RIC regimen.     CV:   -Hx of A-fib with transplant in 2020; required diltiazem+metoprolol; resolved post acute episode     * Continues on metoprolol mainly for BP.      * Plan for cardiac electrolytes     -HTN: has discontinued diltiazem prior to the start of venetoclax (for DDI) currently managed with metoprolol monotherapy; decreased to 50 mg/day ~1-2 months ago  -9/20: Adding norvasc 5mg  daily for persistent HTN  -10/12: Increased Amlodipine to 10mg  daily   -09/12/2023: Given syncopal episode/hypotension will hold amlodipine.    Syncopal Episode on 09/12/2023 while been seen in outpatient after standing for weight. Lost consciousness but did not hit head. Poor food intake per pt, improved fluid intake over past 48hrs. Continues to have watery diarrhea despite imodium use.  - 09/12/2023: Cardiac enzymes WNL. EKG showed sinus rhythm with premature atrial beats. Will start on tele. CT head without acute abnormalities, CT chest without acute abnormalities. Blood and urine cultures obtained in ED, FU results. Will hold off on antibiotics given no fevers.   -09/13/23: Reports that he had some dizziness with standing while at home. None so far today. Work up thus far has been negative for cardiac etiology. Likely combination of orthostasis from norvasc which is now being held and dehydration for decreased oral intake and diarrhea losses. Will continue tele x24hrs.   -09/14/23: No further dizziness. Orthostatic BPs ordered for today. PVCs on tele.  -09/16/23: remains orthostatic but significantly improved from admission, stable for discharge with follow up on Monday     Pulm:  10/8 new oxygen requirement (10/9 off oxygen): CXR: shows no acute findings. RPP negative     ENT:  - Chronic cough when supine from PND? Try flonase or saline spray     GU:   Hx of BPH, previously on flomax will restart 10/2 (having some urinary urgency and incontinence due to mobility issues)   Hold sildenafil (for ED)     Endocrine:  Hx of steroid induced hyperglycemia not currently on medications     Neuro/Pain: No issues     Psych: CCSP consult prn.      Deconditioning:  - PT/OT consulted, has OP PT referrals in, has Rolator in his room for discharge  - Uses a cane for walking.        Timothy Miller, MD  [facility]

## 2023-09-17 ENCOUNTER — Ambulatory Visit
Admit: 2023-09-17 | Discharge: 2023-09-17 | Payer: MEDICARE | Attending: Pharmacist Clinician (PhC)/ Clinical Pharmacy Specialist | Primary: Pharmacist Clinician (PhC)/ Clinical Pharmacy Specialist

## 2023-09-17 ENCOUNTER — Ambulatory Visit: Admit: 2023-09-17 | Discharge: 2023-09-17 | Payer: MEDICARE | Attending: Primary Care | Primary: Primary Care

## 2023-09-17 ENCOUNTER — Encounter
Admit: 2023-09-17 | Discharge: 2023-09-17 | Payer: MEDICARE | Attending: Student in an Organized Health Care Education/Training Program | Primary: Student in an Organized Health Care Education/Training Program

## 2023-09-17 ENCOUNTER — Other Ambulatory Visit: Admit: 2023-09-17 | Discharge: 2023-09-17 | Payer: MEDICARE

## 2023-09-17 DIAGNOSIS — Z5181 Encounter for therapeutic drug level monitoring: Principal | ICD-10-CM

## 2023-09-17 DIAGNOSIS — M8589 Other specified disorders of bone density and structure, multiple sites: Principal | ICD-10-CM

## 2023-09-17 DIAGNOSIS — C9201 Acute myeloblastic leukemia, in remission: Principal | ICD-10-CM

## 2023-09-17 DIAGNOSIS — Z9481 Bone marrow transplant status: Principal | ICD-10-CM

## 2023-09-17 DIAGNOSIS — D84822 Immunocompromised state associated with stem cell transplant (CMS-HCC): Principal | ICD-10-CM

## 2023-09-17 DIAGNOSIS — C9200 Acute myeloblastic leukemia, not having achieved remission: Principal | ICD-10-CM

## 2023-09-17 DIAGNOSIS — D696 Thrombocytopenia, unspecified: Principal | ICD-10-CM

## 2023-09-17 DIAGNOSIS — Z8679 Personal history of other diseases of the circulatory system: Principal | ICD-10-CM

## 2023-09-17 DIAGNOSIS — Z9181 History of falling: Principal | ICD-10-CM

## 2023-09-17 DIAGNOSIS — E876 Hypokalemia: Principal | ICD-10-CM

## 2023-09-17 DIAGNOSIS — Z87898 Personal history of other specified conditions: Principal | ICD-10-CM

## 2023-09-17 DIAGNOSIS — Z9484 Stem cells transplant status: Principal | ICD-10-CM

## 2023-09-17 DIAGNOSIS — K529 Noninfective gastroenteritis and colitis, unspecified: Principal | ICD-10-CM

## 2023-09-17 DIAGNOSIS — R2689 Other abnormalities of gait and mobility: Principal | ICD-10-CM

## 2023-09-17 DIAGNOSIS — Z7409 Other reduced mobility: Principal | ICD-10-CM

## 2023-09-17 DIAGNOSIS — R42 Dizziness and giddiness: Principal | ICD-10-CM

## 2023-09-17 LAB — COMPREHENSIVE METABOLIC PANEL
ALBUMIN: 2.7 g/dL — ABNORMAL LOW (ref 3.4–5.0)
ALKALINE PHOSPHATASE: 92 U/L (ref 46–116)
ALT (SGPT): 28 U/L (ref 10–49)
ANION GAP: 8 mmol/L (ref 5–14)
AST (SGOT): 26 U/L (ref <=34)
BILIRUBIN TOTAL: 0.4 mg/dL (ref 0.3–1.2)
BLOOD UREA NITROGEN: 16 mg/dL (ref 9–23)
BUN / CREAT RATIO: 16
CALCIUM: 7.7 mg/dL — ABNORMAL LOW (ref 8.7–10.4)
CHLORIDE: 105 mmol/L (ref 98–107)
CO2: 24 mmol/L (ref 20.0–31.0)
CREATININE: 1.03 mg/dL
EGFR CKD-EPI (2021) MALE: 75 mL/min/{1.73_m2} (ref >=60)
GLUCOSE RANDOM: 144 mg/dL (ref 70–179)
POTASSIUM: 3.6 mmol/L (ref 3.4–4.8)
PROTEIN TOTAL: 5.4 g/dL — ABNORMAL LOW (ref 5.7–8.2)
SODIUM: 137 mmol/L (ref 135–145)

## 2023-09-17 LAB — CMV DNA, QUANTITATIVE, PCR
CMV QUANT: <35 [IU]/mL — ABNORMAL HIGH (ref <0)
CMV VIRAL LD: DETECTED — AB

## 2023-09-17 LAB — CBC W/ AUTO DIFF
BASOPHILS ABSOLUTE COUNT: 0 10*9/L (ref 0.0–0.1)
BASOPHILS RELATIVE PERCENT: 0.4 %
EOSINOPHILS ABSOLUTE COUNT: 0 10*9/L (ref 0.0–0.5)
EOSINOPHILS RELATIVE PERCENT: 0 %
HEMATOCRIT: 24.5 % — ABNORMAL LOW (ref 39.0–48.0)
HEMOGLOBIN: 8.4 g/dL — ABNORMAL LOW (ref 12.9–16.5)
LYMPHOCYTES ABSOLUTE COUNT: 0.2 10*9/L — ABNORMAL LOW (ref 1.1–3.6)
LYMPHOCYTES RELATIVE PERCENT: 2.1 %
MEAN CORPUSCULAR HEMOGLOBIN CONC: 34.3 g/dL (ref 32.0–36.0)
MEAN CORPUSCULAR HEMOGLOBIN: 33.5 pg — ABNORMAL HIGH (ref 25.9–32.4)
MEAN CORPUSCULAR VOLUME: 97.9 fL — ABNORMAL HIGH (ref 77.6–95.7)
MEAN PLATELET VOLUME: 8.2 fL (ref 6.8–10.7)
MONOCYTES ABSOLUTE COUNT: 1.1 10*9/L — ABNORMAL HIGH (ref 0.3–0.8)
MONOCYTES RELATIVE PERCENT: 13.2 %
NEUTROPHILS ABSOLUTE COUNT: 7.2 10*9/L (ref 1.8–7.8)
NEUTROPHILS RELATIVE PERCENT: 84.3 %
PLATELET COUNT: 130 10*9/L — ABNORMAL LOW (ref 150–450)
RED BLOOD CELL COUNT: 2.5 10*12/L — ABNORMAL LOW (ref 4.26–5.60)
RED CELL DISTRIBUTION WIDTH: 14.9 % (ref 12.2–15.2)
WBC ADJUSTED: 8.6 10*9/L (ref 3.6–11.2)

## 2023-09-17 LAB — MAGNESIUM: MAGNESIUM: 7.5 mg/dL (ref 1.6–2.6)

## 2023-09-17 LAB — TACROLIMUS LEVEL, TIMED: TACROLIMUS BLOOD: 6.5 ng/mL

## 2023-09-17 LAB — EBV QUANTITATIVE PCR, BLOOD: EBV VIRAL LOAD RESULT: NOT DETECTED

## 2023-09-17 MED ADMIN — heparin, porcine (PF) 100 unit/mL injection 200 Units: 200 [IU] | INTRAVENOUS | @ 16:00:00 | Stop: 2023-09-17

## 2023-09-17 NOTE — Unmapped (Signed)
Specialty Medication(s): Prevymis (not onboarded)    Mr.Adamczak has been dis-enrolled from the [facility] specialty pharmacy services due to a pharmacy change. The patient is now filling at [facility] .    Additional information provided to the patient: NA    [name], [facility]  [facility] Specialty Pharmacist

## 2023-09-17 NOTE — Unmapped (Signed)
BMTCTP Discharge Summary    Admit date: 09/12/2023  Discharge date: 09/16/23  Discharge to: Home  Discharge Service: MDT    [place] Myeloma Physician: Dr Donalynn Furlong  [facility] Leukemia Physician: Dr. [name]  BMT Attending MD: 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  Type of Transplant: RIC MRD Allo  Graft Source: Fresh PBSCs  Transplant Day: 1571 (Autologous PBSC Unknown Phase - Planned 05/29/2019), 25 (Allogeneic PBSC Unknown Phase - Planned 08/22/2023)     Procedures: n/a  Discharge diagnosis/complications:   Diarrhea (Resolved)   Orthostatic hypotension   S/p alloSCT   IBS-D   Electrolyte derangements in the setting of diarrhea      Donor information:   Type of stem cells: related male  Blood Type: O+  CMV Status: positive  Type of match: 8/8    Interval History:   -feeling well this AM   -no plans for endoscopy per GI   -diarrhea resolved   -able to ambulate without assistance     ROS:  Comprehensive ROS negative except pertinent positives listed in interval history.     Physical exam:  Vitals:    09/16/23 1242   BP: 130/67   Pulse: 102   Resp: 18   Temp: 36.8 ??C (98.2 ??F)   SpO2: 94%     Vitals:    09/13/23 2015 09/15/23 2025   Weight: (!) 116.8 kg (257 lb 8 oz) (!) 112.5 kg (248 lb 0.3 oz)       KPS at discharge: 60, Requires occasional assistance, but is able to care for most of his personal needs (ECOG equivalent 2)     General : No acute distress noted.   Central venous access: Line clean, dry, intact. No erythema or drainage noted.   ENT: Moist mucous membranes. Oropharhynx without lesions, erythema or exudate.   Cardiovascular: Pulse normal rate, regularity and rhythm. S1 and S2 normal, without any murmur, rub, or gallop.  Lungs: Clear to auscultation bilateraly, without wheezes/crackles/rhonchi. Good air movement.   Skin: Warm, dry, intact. No rash noted.   Psychiatry: Alert and oriented to person, place, and time.   GI: Normoactive bowel sounds, abdomen soft, non-tender   Extremeties: No edema.   Musculoskeletal/Extremities: Full range of motion in shoulder, elbow, hip knee, ankle, left hand and feet.  Neurologic: CNII-XII intact. Normal strength and sensation throughout    Lab Results   Component Value Date    WBC 5.3 09/16/2023    HGB 8.9 (L) 09/16/2023    HCT 24.9 (L) 09/16/2023    PLT 123 (L) 09/16/2023     Lab Results   Component Value Date    NA 139 09/16/2023    K 3.7 09/16/2023    CL 107 09/16/2023    CO2 27.0 09/16/2023    BUN 14 09/16/2023    CREATININE 0.93 09/16/2023    GLU 121 09/16/2023    CALCIUM 8.1 (L) 09/16/2023    MG 1.5 (L) 09/16/2023    PHOS 3.7 09/13/2023     Lab Results   Component Value Date    BILITOT 0.3 09/13/2023    BILIDIR 0.20 09/13/2023    PROT 5.3 (L) 09/13/2023    ALBUMIN 2.8 (L) 09/13/2023    ALT 26 09/13/2023    AST 24 09/13/2023    ALKPHOS 95 09/13/2023    GGT 64 06/28/2019     Lab Results   Component Value Date  PT 12.6 09/13/2023    INR 1.13 09/13/2023    APTT 26.6 09/13/2023         Hospital Course/Assessment and Plan  BMT: AML with prior history of MM. AML now in CR MRD Negative  HCT-CI (age adjusted) 2 (age and hx of A-fib)  Conditioning: RIC Flu/Bu      Donor: 8/8, ABO O+, CMV positive  Recipient A+ and CMV+  Engraftment: G-CSF starting Day + 5 through WBC recovery (as defined as ANC 1.0 x 2 days or 3.0 x 1 day)  -Date of last G-CSF injection: 09/06/23      GVHD prophylaxis:   1. Cyclophosphamide 50 mg/kg IV daily on days +3 and +4 with mesna  2. Tacrolimus to goal trough (5-10 ng/mL) starting day +5  3. Mycophenolate mofetil 15 mg/kg PO TID (max 1g PO TID) starting day +5 through day +35      Hem: Transfuse 1 unit of PRBCs for hemoglobin <7 and 1 unit of platelets for Plt <10K or bleeding. Timothy Porter. Timothy Porter does not have a history of transfusion reactions. Consent was obtained and documented on 08/15/23.     ID:   Prophylaxis:  -Antiviral: Valtrex 500 mg po BID , continue through 2 years post transplant Letermovir 480 mg po to start at day +15 and continue through day +200   -Antifungal: Fluconazole 400 mg po daily, continue through D+75  -Antibacterial: none indicated  -PJP: Bactrim DS once daily MWF upon platelet engraftment (started 10/16)      GI:   GERD prophylaxis:  -Home med: Prilosec 40 mg daily. (switched to pantoprazole while inpatient)     Nausea:  -Zofran as needed     Hx of IBS with chronic alternating diarrhea/constipation  -Has managed with changing doses of Questran; increased to 1 packet BID in 02/2023 with improvement of diarrhea; self tapered to 0.5 packets BID  -Reports predominantly constipation concerns but would have occasional accidents; says he hasn't soiled himself in months with current dose  -Uses docusate only if no BM 3-4 days  -Meticulously self tracks and titrates medication  -Continue Questran dosing on admission with prn docusate; likely will need to hold if he develops diarrhea  -10/1: Changing Questran to PRN  -10/2: Cdiff negative  -10/10 Lomotil and Imodium to PRN  -10/14: Having 3-4 loose stools a day, using imodium 4mg  QID PRN. Diarrhea is a long standing issue for him, incontinence is not uncommon will need to assess for changes to BM pattern in clinic  - 09/12/2023: Continues to have watery diarrhea on admission, Imodium 4mg  QID prn. Can consider adding lomotil prn. Last c diff test 10/12, will hold off repeating for now.  -09/13/23: Orders in for GIPP.   -09/14/23: Diarrhea worse today with explosive episodes and he is sometimes incontinent. GIPP pending. Has hx of IBS. Also got a dose of oral mag which could be contributing. Maxing out imodium with lomotil prn. GI consulted for further management.  -10/20: diarrhea resolved. 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 On 10/16 admission Cr 1.29.   - 09/12/2023: Received 1L NS bolus in ED, will FU with admission/daily labs.  -09/13/23: Received an addition liter today.      FEN:  replacement per BMT prn protocol  Home meds: Vitamin D3 25 mcg daily, B12 2000 mcg daily, MV  Hypomagnesemia: 2g IV Mag daily  with HH  Hypokalemia: daily  Hypocalcemia: Calcium carbonate 2 tabs TID    Hepatic: Normal bilirubin. SOS prophylaxis not indicated for RIC regimen.     CV:   -Hx of A-fib with transplant in 2020; required diltiazem+metoprolol; resolved post acute episode     * Continues on metoprolol mainly for BP.      * Plan for cardiac electrolytes     -HTN: has discontinued diltiazem prior to the start of venetoclax (for DDI) currently managed with metoprolol monotherapy; decreased to 50 mg/day ~1-2 months ago  -9/20: Adding norvasc 5mg  daily for persistent HTN  -10/12: Increased Amlodipine to 10mg  daily   -09/12/2023: Given syncopal episode/hypotension will hold amlodipine.     Syncopal Episode on 09/12/2023 while been seen in outpatient after standing for weight. Lost consciousness but did not hit head. Poor food intake per pt, improved fluid intake over past 48hrs. Continues to have watery diarrhea despite imodium use.  - 09/12/2023: Cardiac enzymes WNL. EKG showed sinus rhythm with premature atrial beats. Will start on tele. CT head without acute abnormalities, CT chest without acute abnormalities. Blood and urine cultures obtained in ED, FU results. Will hold off on antibiotics given no fevers.   -09/13/23: Reports that he had some dizziness with standing while at home. None so far today. Work up thus far has been negative for cardiac etiology. Likely combination of orthostasis from norvasc which is now being held and dehydration for decreased oral intake and diarrhea losses. Will continue tele x24hrs.   -09/14/23: No further dizziness. Orthostatic BPs ordered for today. PVCs on tele.  -09/16/23: remains orthostatic but significantly improved from admission, stable for discharge with follow up on Monday     Pulm:   10/8 new oxygen requirement (10/9 off oxygen): CXR: shows no acute findings. RPP negative     ENT:  - Chronic cough when supine from PND? Try flonase or saline spray     GU:   Hx of BPH, previously on flomax will restart 10/2 (having some urinary urgency and incontinence due to mobility issues)   Hold sildenafil (for ED)  -hold flomax      Endocrine:  Hx of steroid induced hyperglycemia not currently on medications     Neuro/Pain: No issues     Psych: CCSP consult prn.      Deconditioning:  - PT/OT consulted, has OP PT referrals in, has Rolator in his room for discharge  - Uses a cane for walking.     Condition at Discharge: fair    I spent greater than 30 minutes in the discharge of this patient.    Carmina Miller, MD   [facility]  Discharge Medications:      Your Medication List        STOP taking these medications      amlodipine 10 MG tablet  Commonly known as: NORVASC     SILDENAFIL ORAL     tamsulosin 0.4 mg capsule  Commonly known as: FLOMAX            START taking these medications      diphenoxylate-atropine 2.5-0.025 mg per tablet  Commonly known as: LOMOTIL  Take 1 tablet by mouth four (4) times a day as needed for diarrhea.            CHANGE how you take these medications      loperamide 2 mg capsule  Commonly known as: IMODIUM  Take 2 capsules (  4 mg total) by mouth four (4) times a day.  What changed:   when to take this  reasons to take this     tacrolimus 1 MG capsule  Commonly known as: PROGRAF  Take 3 capsules (3 mg total) by mouth two (2) times a day.  What changed: how much to take            CONTINUE taking these medications      calcium carbonate 200 mg calcium (500 mg) chewable tablet  Commonly known as: TUMS  Chew 2 tablets (400 mg of elem calcium total) Three (3) times a day.     cholecalciferol (vitamin D3 25 mcg (1,000 units)) 1,000 unit (25 mcg) tablet  Take 1 tablet (25 mcg total) by mouth.     cyanocobalamin (vitamin B-12) 2,000 mcg Tab  Take 2,000 mcg by mouth in the morning. fluconazole 200 MG tablet  Commonly known as: DIFLUCAN  Take 2 tablets (400 mg total) by mouth daily.     loratadine 10 mg tablet  Commonly known as: CLARITIN  Take 1 tablet (10 mg total) by mouth daily.     magnesium sulfate in 0.9 %NaCl 2 gram/50 mL Pgbk  Infuse 2 g into a venous catheter daily.     metoPROLOL succinate 50 MG 24 hr tablet  Commonly known as: Toprol-XL  Take 1 tablet (50 mg total) by mouth daily.     multivitamin with minerals tablet  Take 1 tablet by mouth daily.     mycophenolate 500 mg tablet  Commonly known as: CELLCEPT  Take 2 tablets (1,000 mg total) by mouth Three (3) times a day.     omeprazole 40 MG capsule  Commonly known as: PriLOSEC  TAKE 1 CAPSULE(40 MG) BY MOUTH DAILY     ondansetron 8 MG tablet  Commonly known as: ZOFRAN  Take 1 tablet (8 mg total) by mouth every eight (8) hours as needed for nausea.     potassium chloride 20 MEQ ER tablet  Take 2 tablets (40 mEq total) by mouth daily.     PREVYMIS 480 mg tablet  Generic drug: letermovir  Take 1 tablet (480 mg total) by mouth daily.     sulfamethoxazole-trimethoprim 800-160 mg per tablet  Commonly known as: BACTRIM DS  Take 1 tablet (160 mg of trimethoprim total) by mouth every Monday, Wednesday, and Friday.     valACYclovir 500 MG tablet  Commonly known as: VALTREX  Take 1 tablet (500 mg total) by mouth two (2) times a day.              Pending Test Results:   Pending Labs       Order Current Status    Blood Culture #1 Preliminary result    Blood Culture #2 Preliminary result            Discharge Instructions:   Follow Up instructions and Outpatient Referrals     Ambulatory Referral to Home Health      Reason for referral: home PT/OT    Physician to follow patient's care: PCP    Disciplines requested:  Physical Therapy  Occupational Therapy       Physical Therapy requested: Strengthening exercises    Occupational Therapy Requested: Home safety evaluation    Special instructions: evaluate and treat    Ambulatory Referral to Home Infusion      Reason for referral: Resumption of Care    Performing location?: External    Home Health Requested Disciplines: Nursing     **  Please contact your service pharmacist for assistance with discharge   home health infusion monitoring.      Ambulatory referral to Nutrition Services      Reason for referral: malnutrition/taste alterations      Appointments which have been scheduled for you      Sep 17, 2023 11:45 AM  (Arrive by 11:15 AM)  BMT LAB with ONCBMT LABS  [facility] BMT [place] [facility] REGION) [address]  [place] [address]  [phone]        Sep 17, 2023 12:30 PM  (Arrive by 12:00 PM)  RETURN  COMPLEX with [facility]  [facility] BMT [place] [facility] REGION) [address]  [place] [address]  [phone]        Sep 17, 2023 1:00 PM  (Arrive by 12:30 PM)  RETURN FOLLOW UP [place] with Libby Maw, ANP  [facility] BMT [place] [facility] REGION) [address]  [place] [address]  [phone]        Sep 18, 2023 2:00 PM  (Arrive by 1:30 PM)  BMT LAB with Carnella Guadalajara  [facility] BMT [place] [facility] REGION) [address]  [place] [address]  [phone]        Sep 18, 2023 3:00 PM  (Arrive by 2:30 PM)  RETURN FOLLOW UP [place] with Margretta Ditty, MD  [facility] BMT [place] [facility] REGION) [address]  [place] [address]  [phone]        Sep 19, 2023 8:15 AM  (Arrive by 7:45 AM)  BMT LAB with Carnella Guadalajara  [facility] BMT [place] [facility] REGION) [address]  [place] [address]  [phone]        Sep 19, 2023 9:00 AM  (Arrive by 8:30 AM)  RETURN FOLLOW UP [place] with ONCBMT APP A  [facility] BMT [place] [facility] REGION) [address]  [place] [address]  [phone]        Sep 21, 2023 12:30 PM  (Arrive by 12:00 PM)  BMT LAB with Carnella Guadalajara  [facility] BMT [facility] REGION) [address]  [place] [address]  [phone]        Sep 21, 2023 1:00 PM  (Arrive by 12:30 PM)  RETURN FOLLOW UP [place] with ONCBMT APP A  [facility] BMT [place] [facility] REGION) [address]  [place] [address]  [phone]        Sep 25, 2023 9:15 AM  (Arrive by 8:45 AM)  BMT LAB with Carnella Guadalajara  [facility] BMT [place] [facility] REGION) [address]  [place] [address]  [phone]        Sep 25, 2023 10:00 AM  (Arrive by 9:30 AM)  RETURN FOLLOW UP [place] with ONCBMT APP B  [facility] BMT  [facility] REGION) [address]  [place] [address]  [phone]        Sep 25, 2023 11:00 AM  (Arrive by 10:30 AM)  BONE MARROW BIOPSY with [facility] PROCEDURES  [place] ONCOLOGY INFUSION  [facility] REGION) [address]  [place] [address]  [phone]        Oct 02, 2023 3:30 PM  (Arrive by 3:15 PM)  RETURN VIDEO MYCHART with Ruthell Rummage, MD  [facility] [place] [facility]  HEMATOLOGY [place] [facility] REGION) [address]  1st Floor  [place] [address]  [phone]   Please sign into My [place] Chart at least 15 minutes before your appointment to complete the eCheck-In process. You must complete eCheck-In before you can start your video visit. We also recommend testing your audio and video connection to troubleshoot any issues before your visit begins. Click ???Join Video Visit??? to complete these checks. Once you have completed eCheck-In and tested your audio and video, click ???Join Call??? to connect to your visit.     For your video visit, you will need a computer with a working camera, speaker and microphone, a smartphone, or a tablet with internet access.    My [place] Chart enables you to manage your health, send non-urgent messages to your provider, view your test results, schedule and manage appointments, and request prescription refills securely and conveniently from your computer or mobile device.    You can go to [email or web address] to sign in to your My [place] Chart account with your username and password. If you have forgotten your username or password, please choose the ???Forgot Username???? and/or ???Forgot Password???? links to gain access. You also can access your My [place] Chart account with the free MyChart mobile app for Android or iPhone.    If you need assistance accessing your My [place] Chart account or for assistance in reaching your provider's office to reschedule or cancel your appointment, please call [facility] [phone].         Oct 30, 2023 10:00 AM  (Arrive by 9:30 AM)  BMT LAB with Carnella Guadalajara  [facility] BMT [place] [facility] REGION) [address]  [place] [address]  [phone]        Oct 30, 2023 11:00 AM  (Arrive by 10:30 AM)  RETURN FOLLOW UP [place] with Margretta Ditty, MD  [facility] BMT [place] [facility] REGION) [address]  [place] [address]  [phone]        Nov 15, 2023 9:00 AM  (Arrive by 8:30 AM)  BMT LAB with Carnella Guadalajara  [facility] BMT [place] [facility] REGION) [address]  [place] [address]  [phone]        Nov 15, 2023 10:00 AM  (Arrive by 9:30 AM)  RETURN FOLLOW UP [place] with ONCBMT APP A  [facility] BMT [place] [facility] REGION) [address]  [place] [address]  [phone]        Nov 15, 2023 11:00 AM  (Arrive by 10:30 AM)  BONE MARROW BIOPSY with ONCBMT PROCEDURES  [place] ONCOLOGY INFUSION [place] [facility] REGION) [address]  [place] [address]  [phone]        Nov 20, 2023 10:30 AM  (Arrive by 10:00 AM)  BMT LAB with Carnella Guadalajara  [facility] BMT [place] [facility] REGION) [address]  [place] [address]  [phone]        Nov 20, 2023 11:30 AM  (Arrive by 11:00 AM)  RETURN FOLLOW UP [place] with Margretta Ditty, MD  [facility] BMT  [facility] REGION) [address]  [place] [address]  [phone]        Dec 05, 2023 8:15 AM  (Arrive by 7:45 AM)  BMT LAB with ONCBMT LABS  [facility] BMT  ([place] REG[place]) [address]  [place] [address]  [phone]        Dec 05, 2023 8:30 AM  (Arrive by 8:00 AM)  NURSE COORDINATOR with [facility] COORDINATOR  [facility] BMT [place] [facility] REGION) [address]  [place] [address]  [phone]        Dec 05, 2023 9:00 AM  (Arrive by 8:30 AM)  RETURN FOLLOW UP [place] with ONCBMT PHARMACY  [facility] BMT [place] [facility] REGION) [address]  [place] [address]  [phone]        Dec 05, 2023 10:00 AM  (Arrive by 9:30 AM)  RETURN FOLLOW UP [place] with ONCBMT APP B  [facility] BMT [place] [facility] REGION) [address]  [place] [address]  [phone]        Feb 20, 2024 9:30 AM  (Arrive by 9:00 AM)  BMT LAB with Carnella Guadalajara  [facility] BMT [employer] [place] [facility] REGION) [address]  [place] [address]  [phone]        Feb 20, 2024 10:30 AM  (Arrive by 10:00 AM)  RETURN FOLLOW UP [place] with ONCBMT PHARMACY  [facility] BMT [facility] REGION) [address]  [place] [address]  [phone]

## 2023-09-17 NOTE — Unmapped (Incomplete)
Pharmacy Post-Discharge Assessment:    Timothy Porter is a 77 y.o. 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 He was discharged on 10/14 but was readmitted on 10/16 due to a syncopal episode in clinic. He remained stable on telemetry but continued to have some orthostatics. Amlodipine and tamsulosin have been held as a result of orthostatics. Additionally, he experienced some diarrhea suspected to be due to oral magnesium requiring scheduled imodium and prn lomotil. The patient was discharged on 09/15/20 and arrives in clinic today for his first oupatient follow up visit.    Interval History: He reports feeling overall stable since discharge. He states he is taking scheduled loperamide every 6 hours  and has had one loose bowel movement in the past 24 hours. His wife states this his bowel movements have been much improved and more solid than previous. He reports increasing his fluid intake but still struggling with food intake. Yesterday he had one episode of vomiting and is experiencing some ongoing nausea. He denies any signs and symptoms of hypotension since discharging from the hospital.     Hematology/Oncology History Overview Note   Referring/Local Oncologist:  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 This electronic signature is attestation that the pathologist personally reviewed the submitted material(s) and the final diagnosis reflects that evaluation.          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,XY[7]         Myeloid Mutational Panel:        Variants of Known/Likely Clinical Significance:   Gene Coding Predicted Protein Variant allele fraction   SRSF2 c.284C>G p.(Pro95Arg) 21.6 %       Treatment Timeline:  11/22/2022 Cycle 1:  BAML S12 azacitidine 75mg /m2 x 7 days + venetoclax 400mg  x 28 days  12/13/22: Bmbx - Limited, though no increase in blasts, MRD-neg by flow, poor specimen but no blasts (at <0.2%)   01/04/2023 Cycle 2: aza 75 x 7 + ven 400 x 28 days (2 week delay)  01/23/2023: BMbx: <5% blasts by CD34 immunohistochemistry, flow MRD negative  02/09/2023: Cycle 3: Aza 75 x 7 + ven 400 x 21  03/19/2023: Cycle 4 Aza 75 x 5 + ven 400 x 21 (Aza decreased due to delayed count recovery)  04/16/2023: Cycle 5: Aza 75 x 5 + ven 400 x 21  05/14/2023: Cycle 6: Aza 75 x 5 + ven 400 x 21  06/11/2023: Cycle 7: Aza 75 x 5 + ven 400 x 21  07/09/2023:  Cycle 8: Aza 75 x 5 + ven 400 x 21    08/02/23 BMBx:  Bone marrow, right iliac, aspiration and  biopsy  -  Hypercellular bone marrow (~50%) with erythroid-predominant trilineage hematopoiesis (<1% blasts and <5% plasma cells by manual aspirate differential)  Cytogenetics: pending  MRD: Flow negative <0.02%        Multiple myeloma (CMS-HCC)   02/12/2019 Initial Diagnosis    Multiple myeloma (CMS-HCC)     05/28/2019 - 06/03/2019 Chemotherapy    BMT IP AUTO MELPHALAN  Melphalan 140 mg/m2 or 200 mg/m2 IV Day -1     10/22/2019 - 06/27/2022 Chemotherapy    STUDY [id number] IRB# [id number] LENALIDOMIDE (v. 11/04/18)  A Randomized Study of Daratumumab Plus Lenalidomide Versus Lenalidomide Alone as Maintenance Treatment in Patients with Newly Diagnosed Multiple Myeloma Who Are Minimal Residual Disease Positive After Frontline Autologous Stem Cell Transplant.     History of auto stem cell transplant (CMS-HCC) (Resolved)   08/05/2019 Initial Diagnosis    History of auto stem cell transplant (CMS-HCC)     10/22/2019 - 06/27/2022 Chemotherapy    STUDY [id number] IRB# [id number] LENALIDOMIDE (v. 11/04/18)  A Randomized Study of Daratumumab Plus Lenalidomide Versus Lenalidomide Alone as Maintenance Treatment in Patients with Newly Diagnosed Multiple Myeloma Who Are Minimal Residual Disease Positive After Frontline Autologous Stem Cell Transplant.     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      cholecalciferol, vitamin D3 25 mcg, 1,000 units,, 1,000 unit (25 mcg) tablet Take 1 tablet (25 mcg total) by mouth.      cyanocobalamin, vitamin B-12, 2,000 mcg Tab Take 2,000 mcg by mouth in the morning. 90 tablet 3    diphenoxylate-atropine (LOMOTIL) 2.5-0.025 mg per tablet Take 1 tablet by mouth four (4) times a day as needed for diarrhea. 12 tablet 0    fluconazole (DIFLUCAN) 200 MG tablet Take 2 tablets (400 mg total) by mouth daily. 60 tablet 1    letermovir (PREVYMIS) 480 mg tablet Take 1 tablet (480 mg total) by mouth daily. 28 tablet 6    loperamide (IMODIUM) 2 mg capsule Take 2 capsules (4 mg total) by mouth four (4) times a day. 30 capsule 0    loratadine (CLARITIN) 10 mg tablet Take 1 tablet (10 mg total) by mouth daily.      magnesium sulfate in 0.9 %NaCl 2 gram/50 mL PgBk Infuse 2 g into a venous catheter daily. 700 mL 0    metoPROLOL succinate (TOPROL-XL) 50 MG 24 hr tablet Take 1 tablet (50 mg total) by mouth daily. 30 tablet 11    multivitamin with minerals tablet Take 1 tablet by mouth daily.      mycophenolate (CELLCEPT) 500 mg tablet Take 2 tablets (1,000 mg total) by mouth Three (3) times a day. 98 tablet 0    omeprazole (PRILOSEC) 40 MG capsule TAKE 1 CAPSULE(40 MG) BY MOUTH DAILY 90 capsule 0    ondansetron (ZOFRAN) 8 MG tablet Take 1 tablet (8 mg total) by mouth every eight (8) hours as needed for nausea. 30 tablet 2    potassium chloride 20 MEQ ER tablet Take 2 tablets (40 mEq total) by mouth daily. 20 tablet 0    sulfamethoxazole-trimethoprim (BACTRIM DS) 800-160 mg per tablet Take 1 tablet (160 mg of  trimethoprim total) by mouth every Monday, Wednesday, and Friday. 12 tablet 5    tacrolimus (PROGRAF) 1 MG capsule Take 3 capsules (3 mg total) by mouth two (2) times a day.      valACYclovir (VALTREX) 500 MG tablet Take 1 tablet (500 mg total) by mouth two (2) times a day. 60 tablet 11     No current facility-administered medications for this visit.     Facility-Administered Medications Ordered in Other Visits   Medication Dose Route Frequency Provider Last Rate Last Admin    DTaP-hepatitis B recombinant-IPV (PEDIARIX) 10 mcg-25Lf-25 mcg-10Lf/0.5 mL injection             DTaP-hepatitis B recombinant-IPV (PEDIARIX) 10 mcg-25Lf-25 mcg-10Lf/0.5 mL injection             haemophilus B polysac-tetanus toxoid (ActHIB) 10 mcg/0.5 mL injection             influenza vaccine quad (FLUARIX, FLULAVAL, FLUZONE) (6 MOS & UP) 2020-21 ADS Med             pneumococcal conjugate (13-valent) (PREVNAR-13) 0.5 mL vaccine             pneumococcal conjugate (13-valent) (PREVNAR-13) 0.5 mL vaccine             varicella-zoster gE-AS01B (PF) (SHINGRIX) 50 mcg/0.5 mL injection                Assessment/Plan:    **BMT  - Currently day +26 s/p MRD allogeneic HSCT for AML  - Previously s/p autologous HSCT in 05/2019 for MM   - WBC: 8.6,  ANC 7.2, Hgb 8.4, PLT 130    **GVHD Prophylaxis  - Continue tacrolimus 3 mg PO BID, level from 10/21 in process  - Continue mycophenolate 1,000 mg PO TID through day +35 if no active GVHD present     **ID  Prophylaxis:  - antibacterial - None indicated   - antifungal - fluconazole 400 mg PO QDay through day + 75  - antiviral - valacyclovir 500 mg po BID through at least day +365  - PJP - bactrim 1 DS tablet PO QDay Mon/Wed/Fri only through completion of immunosuppression  - CMV - Continue letermovir 480 mg po daily through day +100    **FENGI  - GERD: continue omeprazole 40 mg daily  - Continue PRN use of zofran for nausea/vomiting   - Hypokalemia: continue KCl 40 mEq PO Qday; CTM and titrate PRN  - Hypomagnesemia: continue Mag sulfate 2 grams IV Qday; CTM and titrate PRN, consider transitioning to PO mag pending improvement in diarrhea  - Continue PTA TUMS 2 tabs TID/ (prefers chewable over tabs)  - Continue PTA vitamin D3, vitamin B12, and MVI  - History of chronic alternating diarrhea/constipation PTA; currently w/ diarrhea which is improving              - Continue SCHEDULED use of loperamide (4 mg) 4 times daily for diarrhea; with plans to taper based on response.               - Continue PRN use of lomotil 4 times daily for diarrhea              - HOLD off on PTA cholestyramine packets (previously was taking 0.5 packet BID PRN diarrhea) and PTA PRN use of docusate for constipation (PTA reported taking only if no BM for 3-4 days) for now     **CV  - HTN:  - Continue metoprolol succinate 50 mg  daily  - History of Afib with autoHSCT in 2020 > required diltiazem & metoprolol but resolved post-acute episode  - Discharged on K and Mag supplements as above given cardiac hx     **GU  - h/o BPH: previously on flomax, restarted 10/2; HOLD flomax 0.4 mg daily; CTM orthostatics  - HOLD sildenafil (for ED)    **Other  - Continue PTA loratadine 10 mg daily    **[facility]one [facility]  - 30-day DEXA scan ordered on 09/25/23  - F/u results when available to determine need for zoledronic acid infusions    Pharmacy Information: Mr. Barresi had prescriptions filled at [facility] (phone:[phone]). At the time of discharge, there were no issues with acquiring medications, and insurance coverage for all medications was adequate.    I provided Mr. Athans with an updated MedActionPlan at this visit. I would be happy to see Mr. Armando in the future as needed for further medication managment issues.    Medications prescribed or ordered upon discharge were reviewed today and reconciled with the most recent outpatient medication list.  Medication reconciliation was conducted by a clinical pharmacist    I spent 20 minutes in direct patient care with this patient    Thank you,    Priscille Heidelberg, PharmD  PGY2 Oncology Pharmacy Resident

## 2023-09-17 NOTE — Unmapped (Signed)
Covering Triage Phone  This patient's partner Jolee Ewing called about the patient having some issues with walking. He was discharged yesterday and He has an appt with [place] today. She wanted to know about getting an order for home health for a wheelchair. Communicated to APP Marcelline Deist via [facility] chat to request follow-up and order.

## 2023-09-17 NOTE — Unmapped (Signed)
BMT Clinic Follow-up    Patient Name: Timothy Porter  MRN: [id number]  Encounter date: 09/17/2023    [facility] Myeloma Physician: Dr Vivien Presto Leukemia Physician: Dr. [name]  BMT Attending MD: [name]     Transplant Day: 1572 (Autologous PBSC Unknown Phase - Planned 05/29/2019), 26 (Allogeneic PBSC Unknown Phase - Planned 08/22/2023)     Reason for Visit: Post allogeneic transplant follow up. This visit included intensive monitoring of high-risk medications/disease states given risk of severe myelosuppression, immunosuppression, infection, and other toxicities. Review included CBC, electrolytes and other blood tests, as well as direct assessment of the patient for symptoms suggesting toxicity.      Transplant/ Hospital Course:  Timothy Porter is a 77 y.o. 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 His transplant course was complicated by diarrhea (hx of IBS with alternating diarrhea/constipation; C diff neg) and fevers (culture neg, treated with Cefepime). Diarrhea required scheduled Lomotil and Imodium and now controlled with scheduled Imodium at home.Marland Kitchen He also had hypomagnesemia requiring daily IV Mag and discharging with 2g IV Mag and hypokalemia discharging with 40k daily. Later in transplant course developed orthostatic hypotension, and on his first outpatient clinic visit had orthostatic episode with brief loss of consciousness, requiring re-admission for workup. He was on amlodipine this was held and not resumed, his flomax dose was also held. His course was complicated by weakness and deconditioning with need for PT/OT.     Interval History  Timothy Porter is a 77 y.o. male with a diagnosis of AML (secondary from prior MM). Timothy Porter is now s/p a matched related donor stem cell transplant.   Timothy Porter presents with his wife for clinic follow up visit.   He is doing ok at home, but is very weak and deconditioned and had difficulty getting into his house with just the walker. He is able to walk very short distances <30 ft, but tires easily and needs to sit due to weakness. His wife had to help pull him into the house on the rolling walker, as he did not have endurance to make it into the house. They were able to get into car this morning and he has used a hospital wheelchair to get into clinic from car today, but he and his wife would like a wheelchair for longer distances. He also has some ongoing mild orthostasis and had had falls in the past. He is working on drinking fluid and is changing positions slowly to help with this, but he does still endorse dizziness intermittently with just sitting.   He is drinking better, with water, Coke, Gatorade. He has 28 oz of water this morning in addition has his water bottle with him and has had about half.  He is eating minimally, but is not having any nausea, vomiting, just not hungry. Has Zofran for nausea if needed.   He is eating ice cream, Kind bars, and some Fairlife shakes.   Diarrhea is improved, taking Imodium 4x daily and having 1 soft BM daily.   Infusing IV mag without issues, IV mag infusing during lab draw so Mag level falsely elevated.  Afebrile. No infectious symptoms.   PT is scheduled to meet him this afternoon for initial evaluation.       Physical Exam:  BP 96/63  -  Pulse 105  - Temp 36.7 ??C (98.1 ??F) (Temporal)  - Resp 18  - SpO2 95%    General : No acute distress noted.   Central venous access: Line clean, dry, intact. No erythema or drainage noted.   ENT: Moist mucous membranes. Oropharhynx without lesions, erythema or exudate.   Cardiovascular: Pulse normal rate, regularity and rhythm. S1 and S2 normal, without any murmur, rub, or gallop.  Lungs: Clear to auscultation bilaterally, without wheezes/crackles/rhonchi. Good air movement. Skin: Warm, dry, intact. No rash noted.   Psychiatry: Alert and oriented to person, place, and time.   Gastrointestinal/Abdomen: Normoactive bowel sounds, abdomen soft, non-tender   Musculoskeletal/Extremities: FROM throughout. No joint swelling, tenderness, or stiffness.   Neurologic: CNII-XII grossly intact.     Acute Graft versus Host Disease Assessment September 17, 2023  Organ stage:    Skin stage: 0 no active (erythematous) rash,  none ;   Upper GI stage 0 No or intermittent nausea, vomiting or anorexia   Lower GI stage 0 No Diarrhea or <500 mL/day or < 3 episodes per day;   Hepatic stage 0 Bilirubin normal - 2mg /dL    Overall grade (Current) Grade 0: No stage 1-4 of any organ  Max Grade of acute GVHD post-HCT: Grade 0: No stage 1-4 of any organ    Assessment/Plan:  SCT Summary: Transplant course complicated by by diarrhearequired scheduled Lomotil and Imodium and now controlled with scheduled Imodium at home.Marland Kitchen He also had hypomagnesemia requiring daily IV Mag and discharging with 2g IV Mag and hypokalemia discharging with 40k daily. Later in transplant course developed orthostatic hypotension, and on his first outpatient clinic visit had orthostatic episode with brief loss of consciousness, requiring re-admission for workup. He was on amlodipine this was held and not resumed, his flomax dose was also held. His course was also complicated by weakness and deconditioning with need for PT/OT which is ongoing.     Type of Transplant: RIC MRD Allo   Conditioning: RIC Flu/Bu   - Graft Source: Fresh PBSCs  Donor: 8/8, ABO O+, CMV positive  Recipient A+ and CMV+    Disease: AML with prior history of MM.   - Pre BMT disease status: CR MRD Negative  - Signs/symptoms of disease relapse: None   - Restaging plan:    *Next BMBX: Day +90   *Next Chimerism: Day +30 from PB (ordered for 10/25)    Immunosuppressed: High risk of infections, if patient develops a fever will admit to inpatient unit for IV antibiotics and fever workup   - Prophylaxis:          * Antiviral: Valtrex 500 mg po BID through 2 years post transplant. Letermovir 480 mg po daily through day +100 to prevent CMV infections   * Antifungal: Fluconazole 400 mg po daily through day +75  * PJP: Bactrim DS once daily MWF upon platelet engraftment. 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   - Prophylaxis: Cyclophosphamide 50 mg/kg IV daily on days +3 and +4 with mesna (completed)                          Mycophenolate mofetil 15 mg/kg PO TID (max 1g PO TID) starting day +5 through day +35 (last dose on 10/30)  Tacrolimus  * Goal trough: 5-10     * High risk drug monitoring and dose adjustments per BMT Pharmacist    * Signs/symptoms of toxicity: None      Anemia/Thrombocytopenia  -Transfuse 1 unit of PRBCs for hemoglobin <7 and 1 unit of platelets for Plt <10K or bleeding. [name]. Gaskill does not have a history of transfusion reactions. Consent was obtained and documented on 08/15/23.   - Plt trending up 130, Hgb stable 8.4. Not requiring transfusions.     GI:   GERD prophylaxis:  -Home med: Prilosec 40 mg daily. (switched to pantoprazole while inpatient)     Nausea:  -Zofran as needed     Hx of IBS with chronic alternating diarrhea/constipation  -Has managed with changing doses of Questran; increased to 1 packet BID in 02/2023 with improvement of diarrhea; self tapered to 0.5 packets BID  -Reports predominantly constipation concerns but would have occasional accidents; says he hasn't soiled himself in months with current dose  -Uses docusate only if no BM 3-4 days  -Meticulously self tracks and titrates medication  -Continue Questran dosing on admission with prn docusate; likely will need to hold if he develops diarrhea  -10/1: Changing Questran to PRN  -10/2: Cdiff negative  -10/10 Lomotil and Imodium to PRN  -10/14: Having 3-4 loose stools a day, using imodium 4mg  QID PRN. Diarrhea is a long standing issue for him, incontinence is not uncommon will need to assess for changes to BM pattern in clinic  - 09/12/2023: Continues to have watery diarrhea on admission, Imodium 4mg  QID prn. Can consider adding lomotil prn. Last c diff test 10/12, will hold off repeating for now.  -09/13/23: Orders in for GIPP.   -09/14/23: Diarrhea worse today with explosive episodes and he is sometimes incontinent. GIPP pending. Has hx of IBS. Also got a dose of oral mag which could be contributing. Maxing out imodium with lomotil prn. GI consulted for further management.  -10/20: diarrhea resolved. 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 On 10/16 admission Cr 1.29.   Oral intake improving, creatinine 1.03     FEN:  replacement per BMT prn protocol  Home meds: Vitamin D3 25 mcg daily, B12 2000 mcg daily, MV  Hypomagnesemia: 2g IV Mag daily with HH  Hypokalemia: daily  Hypocalcemia: Calcium carbonate 2 tabs TID    Hepatic: Normal bilirubin. SOS prophylaxis not indicated for RIC regimen.     CV:   -Hx of A-fib with transplant in 2020; required diltiazem+metoprolol; resolved post acute episode     * Continues on metoprolol 50 mg daily          -HTN: has discontinued diltiazem prior to the start of venetoclax (for DDI) currently managed with metoprolol monotherapy; decreased to 50 mg/day ~1-2 months ago  -9/20: Adding norvasc 5mg  daily for persistent HTN  -10/12: Increased Amlodipine to 10mg  daily   -09/12/2023: Given syncopal episode/hypotension will hold amlodipine.     Syncopal Episode on 09/12/2023 while been seen in outpatient after standing for weight. Lost consciousness but did not hit head. Poor food intake per pt, improved fluid intake over past 48hrs. Continues to have watery diarrhea despite imodium use.  - 09/12/2023: Cardiac enzymes WNL. EKG showed sinus rhythm with premature atrial beats. Will start on  tele. CT head without acute abnormalities, CT chest without acute abnormalities. Blood and urine cultures obtained in ED, FU results. Will hold off on antibiotics given no fevers.   -09/13/23: Reports that he had some dizziness with standing while at home. None so far today. Work up thus far has been negative for cardiac etiology. Likely combination of orthostasis from norvasc which is now being held and dehydration for decreased oral intake and diarrhea losses. Will continue tele x24hrs.   -09/14/23: No further dizziness. Orthostatic BPs ordered for today. PVCs on tele.  -09/16/23: remains orthostatic but significantly improved from admission, stable for discharge with follow up on Monday   -09/17/23: remains lower on the BP end 90/60 with ongoing intermittent dizziness       GU:   Hx of BPH, previously on flomax will restart 10/2 (having some urinary urgency and incontinence due to mobility issues)   Hold sildenafil (for ED)  -hold flomax due to orthostasis     Endocrine:  Hx of steroid induced hyperglycemia not currently on medications     Neuro/Pain: No issues     Deconditioning:  - PT/OT scheduled outpatient, initial meeting and eval on 09/17/23  - Pt has rolling walker but only able to walk 30 ft at most prior to needing a rest.   He is needing assistance to get in and out of car and from car into clinic appointments and house.  - Recommend wheelchair, he meets the following criteria:  Wheelchair: 1 thru 4 must be documented in addition to 5 AND/OR 6 must be met & documented     1. The patient has mobility limitations that interfere with performing ADL's and MADL's ( a WC will improve this) , Yes  2. Mobility limitations cannot be corrected with a cane or walker , Yes only able to anbulate <30 ft at this time  3. And that patient can express a willingness to use a wheelchair , Yes patient has requested  4. The patient has adequate space in their living environment for a wheelchair, Yes home is all 1 story with adequate space  AND   5. The patient has a caregiver who is available, willing, and able to provide assistance with the wheelchair, yes pt spouse is able to provide assistance     Follow up MWF this week for labs and APP    Future Appointments   Date Time Provider Department Center   09/19/2023  8:15 AM ONCBMT LABS [facility]   09/19/2023  9:00 AM ONCBMT APP A [facility]   09/21/2023 12:30 PM ONCBMT LABS [facility]   09/21/2023  1:00 PM ONCBMT APP A [facility]   09/25/2023  9:15 AM ONCBMT LABS [facility]   09/25/2023 10:00 AM ONCBMT APP B [facility]   09/27/2023 10:00 AM [facility] DEXA RM 1 I[facility]AU[facility] [place]   10/02/2023 10:45 AM ONCBMT LABS [facility]   10/02/2023 11:30 AM Margretta Ditty, MD [facility]   10/02/2023  3:30 PM Ruthell Rummage, MD [facility]   10/30/2023 10:00 AM ONCBMT LABS [facility]   10/30/2023 11:00 AM Margretta Ditty, MD [facility]   11/15/2023  9:00 AM ONCBMT LABS [facility]   11/15/2023 10:00 AM ONCBMT APP A [facility]   11/15/2023 11:00 AM ONCBMT PROCEDURES [id number]   11/20/2023 10:30 AM ONCBMT LABS [facility]   11/20/2023 11:30 AM Aris Everts  Freida Busman, MD [facility]   12/05/2023  8:15 AM ONCBMT LABS [facility]   12/05/2023  8:30 AM ONCBMT COORDINATOR [facility]   12/05/2023  9:00 AM ONCBMT PHARMACY [facility]   12/05/2023 10:00 AM ONCBMT APP B [facility]   02/20/2024  9:30 AM ONCBMT LABS [facility]   02/20/2024 10:30 AM ONCBMT PHARMACY [facility]        Almira Bar, ANP  [facility]

## 2023-09-19 ENCOUNTER — Encounter
Admit: 2023-09-19 | Discharge: 2023-09-20 | Payer: MEDICARE | Attending: Student in an Organized Health Care Education/Training Program | Primary: Student in an Organized Health Care Education/Training Program

## 2023-09-19 ENCOUNTER — Other Ambulatory Visit: Admit: 2023-09-19 | Discharge: 2023-09-20 | Payer: MEDICARE

## 2023-09-19 ENCOUNTER — Ambulatory Visit: Admit: 2023-09-19 | Discharge: 2023-09-20 | Payer: MEDICARE

## 2023-09-19 DIAGNOSIS — Z9484 Stem cells transplant status: Principal | ICD-10-CM

## 2023-09-19 DIAGNOSIS — C9200 Acute myeloblastic leukemia, not having achieved remission: Principal | ICD-10-CM

## 2023-09-19 DIAGNOSIS — R509 Fever, unspecified: Principal | ICD-10-CM

## 2023-09-19 DIAGNOSIS — D84822 Immunocompromised state associated with stem cell transplant (CMS-HCC): Principal | ICD-10-CM

## 2023-09-19 DIAGNOSIS — R42 Dizziness and giddiness: Principal | ICD-10-CM

## 2023-09-19 DIAGNOSIS — C9201 Acute myeloblastic leukemia, in remission: Principal | ICD-10-CM

## 2023-09-19 DIAGNOSIS — I959 Hypotension, unspecified: Principal | ICD-10-CM

## 2023-09-19 DIAGNOSIS — Z9481 Bone marrow transplant status: Principal | ICD-10-CM

## 2023-09-19 DIAGNOSIS — Z5181 Encounter for therapeutic drug level monitoring: Principal | ICD-10-CM

## 2023-09-19 DIAGNOSIS — E876 Hypokalemia: Principal | ICD-10-CM

## 2023-09-19 DIAGNOSIS — D696 Thrombocytopenia, unspecified: Principal | ICD-10-CM

## 2023-09-19 DIAGNOSIS — Z8679 Personal history of other diseases of the circulatory system: Principal | ICD-10-CM

## 2023-09-19 MED ORDER — METOPROLOL SUCCINATE ER 50 MG TABLET,EXTENDED RELEASE 24 HR
ORAL_TABLET | Freq: Every day | ORAL | 11 refills | 30 days | Status: CP
Start: 2023-09-19 — End: 2024-09-18

## 2023-09-21 ENCOUNTER — Ambulatory Visit: Admit: 2023-09-21 | Discharge: 2023-09-22 | Payer: MEDICARE | Attending: Family | Primary: Family

## 2023-09-21 ENCOUNTER — Encounter
Admit: 2023-09-21 | Discharge: 2023-09-22 | Payer: MEDICARE | Attending: Student in an Organized Health Care Education/Training Program | Primary: Student in an Organized Health Care Education/Training Program

## 2023-09-21 ENCOUNTER — Other Ambulatory Visit: Admit: 2023-09-21 | Discharge: 2023-09-22 | Payer: MEDICARE

## 2023-09-21 ENCOUNTER — Encounter: Admit: 2023-09-21 | Discharge: 2023-09-22 | Payer: MEDICARE | Attending: Primary Care | Primary: Primary Care

## 2023-09-21 DIAGNOSIS — D84822 Immunocompromised state associated with stem cell transplant (CMS-HCC): Principal | ICD-10-CM

## 2023-09-21 DIAGNOSIS — Z9484 Stem cells transplant status: Principal | ICD-10-CM

## 2023-09-21 DIAGNOSIS — C9201 Acute myeloblastic leukemia, in remission: Principal | ICD-10-CM

## 2023-09-21 DIAGNOSIS — Z9481 Bone marrow transplant status: Principal | ICD-10-CM

## 2023-09-21 DIAGNOSIS — Z9189 Other specified personal risk factors, not elsewhere classified: Principal | ICD-10-CM

## 2023-09-21 DIAGNOSIS — C9200 Acute myeloblastic leukemia, not having achieved remission: Principal | ICD-10-CM

## 2023-09-21 MED ORDER — BENZONATATE 100 MG CAPSULE
ORAL_CAPSULE | Freq: Four times a day (QID) | ORAL | 1 refills | 8 days | Status: CP | PRN
Start: 2023-09-21 — End: 2024-09-20

## 2023-09-22 DIAGNOSIS — C9201 Acute myeloblastic leukemia, in remission: Principal | ICD-10-CM

## 2023-09-22 DIAGNOSIS — Z9484 Stem cells transplant status: Principal | ICD-10-CM

## 2023-09-22 MED ORDER — TACROLIMUS 1 MG CAPSULE, IMMEDIATE-RELEASE
ORAL_CAPSULE | ORAL | 5 refills | 30 days | Status: CP
Start: 2023-09-22 — End: ?

## 2023-09-24 DIAGNOSIS — Z9484 Stem cells transplant status: Principal | ICD-10-CM

## 2023-09-24 DIAGNOSIS — Z7682 Awaiting organ transplant status: Principal | ICD-10-CM

## 2023-09-24 MED ORDER — POTASSIUM CHLORIDE ER 20 MEQ TABLET,EXTENDED RELEASE(PART/CRYST)
ORAL_TABLET | Freq: Every day | ORAL | 1 refills | 90 days | Status: CP
Start: 2023-09-24 — End: ?

## 2023-09-24 MED ORDER — LOPERAMIDE 2 MG CAPSULE
ORAL_CAPSULE | Freq: Four times a day (QID) | ORAL | 1 refills | 12 days | Status: CP | PRN
Start: 2023-09-24 — End: ?

## 2023-09-25 ENCOUNTER — Ambulatory Visit
Admit: 2023-09-25 | Discharge: 2023-09-29 | Disposition: A | Discharge status: Rehabilitation Facility | Payer: MEDICARE | Source: Ambulatory Visit

## 2023-09-25 ENCOUNTER — Ambulatory Visit: Admit: 2023-09-25 | Discharge: 2023-09-25 | Payer: MEDICARE

## 2023-09-25 ENCOUNTER — Encounter
Admit: 2023-09-25 | Discharge: 2023-09-29 | Disposition: A | Discharge status: Rehabilitation Facility | Payer: MEDICARE | Source: Ambulatory Visit | Attending: Critical Care Medicine

## 2023-09-25 ENCOUNTER — Encounter
Admit: 2023-09-25 | Discharge: 2023-09-29 | Disposition: A | Discharge status: Rehabilitation Facility | Payer: MEDICARE | Source: Ambulatory Visit | Attending: Primary Care

## 2023-09-25 ENCOUNTER — Encounter
Admit: 2023-09-25 | Discharge: 2023-09-25 | Payer: MEDICARE | Attending: Student in an Organized Health Care Education/Training Program | Primary: Student in an Organized Health Care Education/Training Program

## 2023-09-25 ENCOUNTER — Other Ambulatory Visit: Admit: 2023-09-25 | Discharge: 2023-09-25 | Payer: MEDICARE

## 2023-09-25 DIAGNOSIS — C9200 Acute myeloblastic leukemia, not having achieved remission: Principal | ICD-10-CM

## 2023-09-25 DIAGNOSIS — C9201 Acute myeloblastic leukemia, in remission: Principal | ICD-10-CM

## 2023-09-25 DIAGNOSIS — K219 Gastro-esophageal reflux disease without esophagitis: Principal | ICD-10-CM

## 2023-09-25 DIAGNOSIS — C9000 Multiple myeloma not having achieved remission: Principal | ICD-10-CM

## 2023-09-25 DIAGNOSIS — R5381 Other malaise: Principal | ICD-10-CM

## 2023-09-25 DIAGNOSIS — Z9484 Stem cells transplant status: Principal | ICD-10-CM

## 2023-09-25 DIAGNOSIS — Z8679 Personal history of other diseases of the circulatory system: Principal | ICD-10-CM

## 2023-09-25 DIAGNOSIS — Z9189 Other specified personal risk factors, not elsewhere classified: Principal | ICD-10-CM

## 2023-09-25 DIAGNOSIS — D84822 Immunocompromised state associated with stem cell transplant (CMS-HCC): Principal | ICD-10-CM

## 2023-09-25 DIAGNOSIS — Z9481 Bone marrow transplant status: Principal | ICD-10-CM

## 2023-09-25 DIAGNOSIS — D649 Anemia, unspecified: Principal | ICD-10-CM

## 2023-09-25 MED ORDER — OMEPRAZOLE 40 MG CAPSULE,DELAYED RELEASE
ORAL_CAPSULE | ORAL | 0 refills | 0.00000 days
Start: 2023-09-25 — End: ?

## 2023-09-26 MED ORDER — OMEPRAZOLE 40 MG CAPSULE,DELAYED RELEASE
ORAL_CAPSULE | ORAL | 0 refills | 0 days
Start: 2023-09-26 — End: ?

## 2023-09-28 MED ORDER — ELIQUIS 5 MG TABLET
ORAL_TABLET | Freq: Two times a day (BID) | ORAL | 0 refills | 30 days
Start: 2023-09-28 — End: 2023-09-28

## 2023-09-28 MED ORDER — XARELTO 20 MG TABLET
ORAL_TABLET | Freq: Every day | ORAL | 0 refills | 30 days
Start: 2023-09-28 — End: 2023-09-28

## 2023-09-29 ENCOUNTER — Ambulatory Visit
Admit: 2023-09-29 | Discharge: 2023-10-18 | Disposition: A | Discharge status: Home Health | Payer: MEDICARE | Admitting: Spinal Cord Injury Medicine

## 2023-09-29 ENCOUNTER — Ambulatory Visit: Admit: 2023-09-29 | Payer: MEDICARE | Admitting: Spinal Cord Injury Medicine

## 2023-10-15 MED ORDER — VALACYCLOVIR 500 MG TABLET
ORAL_TABLET | Freq: Two times a day (BID) | ORAL | 0 refills | 30 days | Status: CP
Start: 2023-10-15 — End: 2023-11-14
  Filled 2023-10-18: qty 60, 30d supply, fill #0

## 2023-10-15 MED ORDER — APIXABAN 5 MG TABLET
ORAL_TABLET | Freq: Two times a day (BID) | ORAL | 0 refills | 30 days | Status: CP
Start: 2023-10-15 — End: 2023-11-14
  Filled 2023-10-18: qty 60, 30d supply, fill #0

## 2023-10-15 MED ORDER — DIPHENOXYLATE-ATROPINE 2.5 MG-0.025 MG TABLET
ORAL_TABLET | Freq: Four times a day (QID) | ORAL | 0 refills | 3 days | Status: CP | PRN
Start: 2023-10-15 — End: 2023-10-20
  Filled 2023-10-18: qty 10, 3d supply, fill #0

## 2023-10-15 MED ORDER — MIRTAZAPINE 7.5 MG TABLET
ORAL_TABLET | Freq: Every evening | ORAL | 0 refills | 30 days | Status: CP
Start: 2023-10-15 — End: 2023-11-14
  Filled 2023-10-18: qty 30, 30d supply, fill #0

## 2023-10-16 DIAGNOSIS — R799 Abnormal finding of blood chemistry, unspecified: Principal | ICD-10-CM

## 2023-10-16 DIAGNOSIS — C9200 Acute myeloblastic leukemia, not having achieved remission: Principal | ICD-10-CM

## 2023-10-16 MED ORDER — TACROLIMUS 1 MG CAPSULE, IMMEDIATE-RELEASE
ORAL_CAPSULE | ORAL | 0 refills | 30 days | Status: CP
Start: 2023-10-16 — End: ?
  Filled 2023-10-18: qty 150, 30d supply, fill #0

## 2023-10-16 MED ORDER — PANTOPRAZOLE 40 MG TABLET,DELAYED RELEASE
ORAL_TABLET | Freq: Every day | ORAL | 0 refills | 30 days | Status: CP
Start: 2023-10-16 — End: 2023-11-15
  Filled 2023-10-18: qty 30, 30d supply, fill #0

## 2023-10-16 MED ORDER — CALCIUM 200 MG (AS CALCIUM CARBONATE 500 MG) CHEWABLE TABLET
ORAL_TABLET | Freq: Three times a day (TID) | ORAL | 0 refills | 30 days | Status: CP
Start: 2023-10-16 — End: 2023-11-15

## 2023-10-16 MED ORDER — CHOLECALCIFEROL (VITAMIN D3) 25 MCG (1,000 UNIT) TABLET
ORAL_TABLET | Freq: Every day | ORAL | 11 refills | 30 days | Status: CP
Start: 2023-10-16 — End: 2024-10-15
  Filled 2023-10-18: qty 30, 30d supply, fill #0

## 2023-10-16 MED ORDER — LORATADINE 10 MG TABLET
ORAL_TABLET | Freq: Every day | ORAL | 0 refills | 30 days | Status: CP
Start: 2023-10-16 — End: 2023-11-15
  Filled 2023-10-18: qty 30, 30d supply, fill #0

## 2023-10-16 MED ORDER — FLUCONAZOLE 200 MG TABLET
ORAL_TABLET | Freq: Every day | ORAL | 0 refills | 30 days | Status: CP
Start: 2023-10-16 — End: ?
  Filled 2023-10-18: qty 60, 30d supply, fill #0

## 2023-10-16 MED ORDER — LETERMOVIR 480 MG TABLET
ORAL_TABLET | Freq: Every day | ORAL | 0 refills | 28 days | Status: CP
Start: 2023-10-16 — End: ?

## 2023-10-17 MED ORDER — SULFAMETHOXAZOLE 800 MG-TRIMETHOPRIM 160 MG TABLET
ORAL_TABLET | ORAL | 0 refills | 28 days | Status: CP
Start: 2023-10-17 — End: ?
  Filled 2023-10-18: qty 12, 28d supply, fill #0

## 2023-10-19 ENCOUNTER — Other Ambulatory Visit: Admit: 2023-10-19 | Discharge: 2023-10-19 | Payer: MEDICARE

## 2023-10-19 ENCOUNTER — Ambulatory Visit
Admit: 2023-10-19 | Discharge: 2023-10-19 | Payer: MEDICARE | Attending: Critical Care Medicine | Primary: Critical Care Medicine

## 2023-10-19 ENCOUNTER — Ambulatory Visit: Admit: 2023-10-19 | Discharge: 2023-10-19 | Payer: MEDICARE | Attending: Oncology | Primary: Oncology

## 2023-10-19 ENCOUNTER — Encounter
Admit: 2023-10-19 | Discharge: 2023-10-19 | Payer: MEDICARE | Attending: Student in an Organized Health Care Education/Training Program | Primary: Student in an Organized Health Care Education/Training Program

## 2023-10-19 DIAGNOSIS — M8589 Other specified disorders of bone density and structure, multiple sites: Principal | ICD-10-CM

## 2023-10-19 DIAGNOSIS — C9201 Acute myeloblastic leukemia, in remission: Principal | ICD-10-CM

## 2023-10-19 DIAGNOSIS — Z9481 Bone marrow transplant status: Principal | ICD-10-CM

## 2023-10-19 DIAGNOSIS — C9001 Multiple myeloma in remission: Principal | ICD-10-CM

## 2023-10-19 DIAGNOSIS — Z5181 Encounter for therapeutic drug level monitoring: Principal | ICD-10-CM

## 2023-10-19 DIAGNOSIS — Z9484 Stem cells transplant status: Principal | ICD-10-CM

## 2023-10-19 DIAGNOSIS — R799 Abnormal finding of blood chemistry, unspecified: Principal | ICD-10-CM

## 2023-10-19 DIAGNOSIS — I10 Essential (primary) hypertension: Principal | ICD-10-CM

## 2023-10-19 DIAGNOSIS — J4520 Mild intermittent asthma, uncomplicated: Principal | ICD-10-CM

## 2023-10-19 DIAGNOSIS — C9200 Acute myeloblastic leukemia, not having achieved remission: Principal | ICD-10-CM

## 2023-10-19 MED ORDER — SODIUM CHLORIDE 0.9 % FOR NEBULIZATION
Freq: Four times a day (QID) | RESPIRATORY_TRACT | 1 refills | 8 days | Status: CP | PRN
Start: 2023-10-19 — End: ?
  Filled 2023-10-19: qty 90, 8d supply, fill #0

## 2023-10-19 MED ORDER — TACROLIMUS 1 MG CAPSULE, IMMEDIATE-RELEASE
ORAL_CAPSULE | Freq: Two times a day (BID) | ORAL | 5 refills | 30 days | Status: CP
Start: 2023-10-19 — End: ?

## 2023-10-20 DIAGNOSIS — C9200 Acute myeloblastic leukemia, not having achieved remission: Principal | ICD-10-CM

## 2023-10-22 DIAGNOSIS — C9200 Acute myeloblastic leukemia, not having achieved remission: Principal | ICD-10-CM

## 2023-10-23 ENCOUNTER — Encounter
Admit: 2023-10-23 | Discharge: 2023-10-24 | Payer: MEDICARE | Attending: Student in an Organized Health Care Education/Training Program | Primary: Student in an Organized Health Care Education/Training Program

## 2023-10-23 ENCOUNTER — Ambulatory Visit: Admit: 2023-10-23 | Discharge: 2023-10-24 | Payer: MEDICARE

## 2023-10-23 ENCOUNTER — Ambulatory Visit
Admit: 2023-10-23 | Discharge: 2023-10-24 | Payer: MEDICARE | Attending: Student in an Organized Health Care Education/Training Program | Primary: Student in an Organized Health Care Education/Training Program

## 2023-10-23 DIAGNOSIS — C9001 Multiple myeloma in remission: Principal | ICD-10-CM

## 2023-10-23 DIAGNOSIS — C9200 Acute myeloblastic leukemia, not having achieved remission: Principal | ICD-10-CM

## 2023-10-23 DIAGNOSIS — Z9484 Stem cells transplant status: Principal | ICD-10-CM

## 2023-10-23 DIAGNOSIS — Z9189 Other specified personal risk factors, not elsewhere classified: Principal | ICD-10-CM

## 2023-10-23 DIAGNOSIS — J4520 Mild intermittent asthma, uncomplicated: Principal | ICD-10-CM

## 2023-10-23 DIAGNOSIS — R350 Frequency of micturition: Principal | ICD-10-CM

## 2023-10-23 DIAGNOSIS — D84822 Immunocompromised state associated with stem cell transplant (CMS-HCC): Principal | ICD-10-CM

## 2023-10-23 DIAGNOSIS — N401 Enlarged prostate with lower urinary tract symptoms: Principal | ICD-10-CM

## 2023-10-23 DIAGNOSIS — Z9481 Bone marrow transplant status: Principal | ICD-10-CM

## 2023-10-23 MED ORDER — LETERMOVIR 480 MG TABLET
ORAL_TABLET | Freq: Every day | ORAL | 0 refills | 28 days | Status: CP
Start: 2023-10-23 — End: ?

## 2023-10-24 ENCOUNTER — Ambulatory Visit
Admit: 2023-10-24 | Discharge: 2023-10-25 | Payer: MEDICARE | Attending: Student in an Organized Health Care Education/Training Program | Primary: Student in an Organized Health Care Education/Training Program

## 2023-10-24 DIAGNOSIS — C9200 Acute myeloblastic leukemia, not having achieved remission: Principal | ICD-10-CM

## 2023-10-24 DIAGNOSIS — H65191 Other acute nonsuppurative otitis media, right ear: Principal | ICD-10-CM

## 2023-10-24 DIAGNOSIS — H6123 Impacted cerumen, bilateral: Principal | ICD-10-CM

## 2023-10-24 MED ORDER — CIPROFLOXACIN 0.3 %-DEXAMETHASONE 0.1 % EAR DROPS,SUSPENSION
Freq: Two times a day (BID) | OTIC | 0 refills | 8 days | Status: CP
Start: 2023-10-24 — End: 2023-10-31

## 2023-10-24 MED ORDER — TACROLIMUS 1 MG CAPSULE, IMMEDIATE-RELEASE
ORAL_CAPSULE | ORAL | 5 refills | 30 days | Status: CP
Start: 2023-10-24 — End: ?

## 2023-10-24 MED ORDER — SULFAMETHOXAZOLE 800 MG-TRIMETHOPRIM 160 MG TABLET
ORAL_TABLET | ORAL | 0 refills | 28 days | Status: CP
Start: 2023-10-24 — End: ?

## 2023-10-29 DIAGNOSIS — C9200 Acute myeloblastic leukemia, not having achieved remission: Principal | ICD-10-CM

## 2023-10-29 MED ORDER — DOXYCYCLINE HYCLATE 100 MG CAPSULE
ORAL_CAPSULE | Freq: Two times a day (BID) | ORAL | 0 refills | 10 days | Status: CP
Start: 2023-10-29 — End: ?

## 2023-10-30 ENCOUNTER — Ambulatory Visit: Admit: 2023-10-30 | Discharge: 2023-10-30 | Payer: MEDICARE | Attending: Internal Medicine | Primary: Internal Medicine

## 2023-10-30 ENCOUNTER — Encounter
Admit: 2023-10-30 | Discharge: 2023-10-30 | Payer: MEDICARE | Attending: Student in an Organized Health Care Education/Training Program | Primary: Student in an Organized Health Care Education/Training Program

## 2023-10-30 ENCOUNTER — Other Ambulatory Visit: Admit: 2023-10-30 | Discharge: 2023-10-30 | Payer: MEDICARE

## 2023-10-30 ENCOUNTER — Ambulatory Visit: Admit: 2023-10-30 | Discharge: 2023-10-30 | Payer: MEDICARE

## 2023-10-30 DIAGNOSIS — C9200 Acute myeloblastic leukemia, not having achieved remission: Principal | ICD-10-CM

## 2023-10-30 DIAGNOSIS — H6123 Impacted cerumen, bilateral: Principal | ICD-10-CM

## 2023-10-30 DIAGNOSIS — Z9481 Bone marrow transplant status: Principal | ICD-10-CM

## 2023-10-30 DIAGNOSIS — C9201 Acute myeloblastic leukemia, in remission: Principal | ICD-10-CM

## 2023-10-30 DIAGNOSIS — C9000 Multiple myeloma not having achieved remission: Principal | ICD-10-CM

## 2023-10-30 DIAGNOSIS — R799 Abnormal finding of blood chemistry, unspecified: Principal | ICD-10-CM

## 2023-10-31 ENCOUNTER — Ambulatory Visit: Admit: 2023-10-31 | Discharge: 2023-11-01 | Payer: MEDICARE

## 2023-10-31 DIAGNOSIS — C9200 Acute myeloblastic leukemia, not having achieved remission: Principal | ICD-10-CM

## 2023-11-01 DIAGNOSIS — C9200 Acute myeloblastic leukemia, not having achieved remission: Principal | ICD-10-CM

## 2023-11-01 MED ORDER — MIRTAZAPINE 15 MG TABLET
ORAL_TABLET | Freq: Every evening | ORAL | 0 refills | 30 days | Status: CP
Start: 2023-11-01 — End: ?

## 2023-11-02 ENCOUNTER — Encounter
Admit: 2023-11-02 | Discharge: 2023-11-02 | Payer: MEDICARE | Attending: Student in an Organized Health Care Education/Training Program | Primary: Student in an Organized Health Care Education/Training Program

## 2023-11-02 ENCOUNTER — Other Ambulatory Visit: Admit: 2023-11-02 | Discharge: 2023-11-02 | Payer: MEDICARE

## 2023-11-02 ENCOUNTER — Ambulatory Visit: Admit: 2023-11-02 | Discharge: 2023-11-02 | Payer: MEDICARE | Attending: Primary Care | Primary: Primary Care

## 2023-11-02 DIAGNOSIS — Z9481 Bone marrow transplant status: Principal | ICD-10-CM

## 2023-11-02 DIAGNOSIS — D84822 Immunocompromised state associated with stem cell transplant (CMS-HCC): Principal | ICD-10-CM

## 2023-11-02 DIAGNOSIS — L03115 Cellulitis of right lower limb: Principal | ICD-10-CM

## 2023-11-02 DIAGNOSIS — C9200 Acute myeloblastic leukemia, not having achieved remission: Principal | ICD-10-CM

## 2023-11-02 DIAGNOSIS — Z9189 Other specified personal risk factors, not elsewhere classified: Principal | ICD-10-CM

## 2023-11-02 DIAGNOSIS — Z9484 Stem cells transplant status: Principal | ICD-10-CM

## 2023-11-02 DIAGNOSIS — R5381 Other malaise: Principal | ICD-10-CM

## 2023-11-02 DIAGNOSIS — R350 Frequency of micturition: Principal | ICD-10-CM

## 2023-11-02 DIAGNOSIS — R059 Cough, unspecified type: Principal | ICD-10-CM

## 2023-11-02 DIAGNOSIS — Z5181 Encounter for therapeutic drug level monitoring: Principal | ICD-10-CM

## 2023-11-02 DIAGNOSIS — C9201 Acute myeloblastic leukemia, in remission: Principal | ICD-10-CM

## 2023-11-02 DIAGNOSIS — C9001 Multiple myeloma in remission: Principal | ICD-10-CM

## 2023-11-02 DIAGNOSIS — N401 Enlarged prostate with lower urinary tract symptoms: Principal | ICD-10-CM

## 2023-11-02 DIAGNOSIS — R197 Diarrhea, unspecified: Principal | ICD-10-CM

## 2023-11-02 MED ORDER — ALBUTEROL SULFATE HFA 90 MCG/ACTUATION AEROSOL INHALER
Freq: Four times a day (QID) | RESPIRATORY_TRACT | 3 refills | 0 days | Status: CP | PRN
Start: 2023-11-02 — End: 2023-12-02

## 2023-11-02 MED ORDER — PSEUDOEPHEDRINE 30 MG TABLET
ORAL_TABLET | Freq: Four times a day (QID) | ORAL | 0 refills | 6 days | Status: CP | PRN
Start: 2023-11-02 — End: 2023-11-16

## 2023-11-03 DIAGNOSIS — C9200 Acute myeloblastic leukemia, not having achieved remission: Principal | ICD-10-CM

## 2023-11-06 ENCOUNTER — Encounter
Admit: 2023-11-06 | Discharge: 2023-11-07 | Disposition: A | Discharge status: Home / Self Care | Payer: MEDICARE | Attending: Student in an Organized Health Care Education/Training Program

## 2023-11-06 ENCOUNTER — Ambulatory Visit: Admit: 2023-11-06 | Discharge: 2023-11-07 | Disposition: A | Discharge status: Home / Self Care | Payer: MEDICARE

## 2023-11-06 ENCOUNTER — Ambulatory Visit
Admit: 2023-11-06 | Discharge: 2023-11-07 | Disposition: A | Discharge status: Home / Self Care | Payer: MEDICARE | Attending: Student in an Organized Health Care Education/Training Program

## 2023-11-06 ENCOUNTER — Encounter
Admit: 2023-11-06 | Discharge: 2023-11-07 | Disposition: A | Discharge status: Home / Self Care | Payer: MEDICARE | Attending: Adult Health

## 2023-11-06 DIAGNOSIS — C9200 Acute myeloblastic leukemia, not having achieved remission: Principal | ICD-10-CM

## 2023-11-06 DIAGNOSIS — R799 Abnormal finding of blood chemistry, unspecified: Principal | ICD-10-CM

## 2023-11-06 DIAGNOSIS — Z9481 Bone marrow transplant status: Principal | ICD-10-CM

## 2023-11-06 DIAGNOSIS — J45991 Cough variant asthma: Principal | ICD-10-CM

## 2023-11-06 DIAGNOSIS — C9201 Acute myeloblastic leukemia, in remission: Principal | ICD-10-CM

## 2023-11-07 DIAGNOSIS — C9200 Acute myeloblastic leukemia, not having achieved remission: Principal | ICD-10-CM

## 2023-11-07 DIAGNOSIS — Z9481 Bone marrow transplant status: Principal | ICD-10-CM

## 2023-11-09 ENCOUNTER — Ambulatory Visit
Admit: 2023-11-09 | Discharge: 2023-11-09 | Payer: MEDICARE | Attending: Student in an Organized Health Care Education/Training Program | Primary: Student in an Organized Health Care Education/Training Program

## 2023-11-09 ENCOUNTER — Telehealth: Admit: 2023-11-09 | Discharge: 2023-11-09 | Payer: MEDICARE

## 2023-11-09 ENCOUNTER — Other Ambulatory Visit: Admit: 2023-11-09 | Discharge: 2023-11-09 | Payer: MEDICARE

## 2023-11-09 DIAGNOSIS — N179 Acute kidney failure, unspecified: Principal | ICD-10-CM

## 2023-11-09 DIAGNOSIS — D84822 Immunocompromised state associated with stem cell transplant (CMS-HCC): Principal | ICD-10-CM

## 2023-11-09 DIAGNOSIS — C9200 Acute myeloblastic leukemia, not having achieved remission: Principal | ICD-10-CM

## 2023-11-09 DIAGNOSIS — Z9481 Bone marrow transplant status: Principal | ICD-10-CM

## 2023-11-09 DIAGNOSIS — C9201 Acute myeloblastic leukemia, in remission: Principal | ICD-10-CM

## 2023-11-09 DIAGNOSIS — R350 Frequency of micturition: Principal | ICD-10-CM

## 2023-11-09 DIAGNOSIS — Z9484 Stem cells transplant status: Principal | ICD-10-CM

## 2023-11-09 DIAGNOSIS — N401 Enlarged prostate with lower urinary tract symptoms: Principal | ICD-10-CM

## 2023-11-09 DIAGNOSIS — C9001 Multiple myeloma in remission: Principal | ICD-10-CM

## 2023-11-09 DIAGNOSIS — R339 Retention of urine, unspecified: Principal | ICD-10-CM

## 2023-11-09 MED ORDER — ALFUZOSIN ER 10 MG TABLET,EXTENDED RELEASE 24 HR
ORAL_TABLET | Freq: Every day | ORAL | 3 refills | 90 days | Status: CP
Start: 2023-11-09 — End: 2024-11-08

## 2023-11-13 ENCOUNTER — Other Ambulatory Visit: Admit: 2023-11-13 | Discharge: 2023-11-13 | Payer: MEDICARE

## 2023-11-13 ENCOUNTER — Encounter
Admit: 2023-11-13 | Discharge: 2023-11-13 | Payer: MEDICARE | Attending: Student in an Organized Health Care Education/Training Program | Primary: Student in an Organized Health Care Education/Training Program

## 2023-11-13 ENCOUNTER — Ambulatory Visit
Admit: 2023-11-13 | Discharge: 2023-11-13 | Payer: MEDICARE | Attending: Critical Care Medicine | Primary: Critical Care Medicine

## 2023-11-13 DIAGNOSIS — R350 Frequency of micturition: Principal | ICD-10-CM

## 2023-11-13 DIAGNOSIS — Z9484 Stem cells transplant status: Principal | ICD-10-CM

## 2023-11-13 DIAGNOSIS — C9201 Acute myeloblastic leukemia, in remission: Principal | ICD-10-CM

## 2023-11-13 DIAGNOSIS — Z9481 Bone marrow transplant status: Principal | ICD-10-CM

## 2023-11-13 DIAGNOSIS — C9001 Multiple myeloma in remission: Principal | ICD-10-CM

## 2023-11-13 DIAGNOSIS — N401 Enlarged prostate with lower urinary tract symptoms: Principal | ICD-10-CM

## 2023-11-13 DIAGNOSIS — C9200 Acute myeloblastic leukemia, not having achieved remission: Principal | ICD-10-CM

## 2023-11-13 MED ORDER — DAPTOMYCIN (CUBICIN) IVPB IN 50 ML
INTRAVENOUS | 0 refills | 7.00 days | Status: CP
Start: 2023-11-13 — End: 2023-11-20

## 2023-11-14 DIAGNOSIS — C9200 Acute myeloblastic leukemia, not having achieved remission: Principal | ICD-10-CM

## 2023-11-14 DIAGNOSIS — C9201 Acute myeloblastic leukemia, in remission: Principal | ICD-10-CM

## 2023-11-14 DIAGNOSIS — Z9484 Stem cells transplant status: Principal | ICD-10-CM

## 2023-11-14 MED ORDER — TACROLIMUS 1 MG CAPSULE, IMMEDIATE-RELEASE
ORAL_CAPSULE | Freq: Two times a day (BID) | ORAL | 5 refills | 30.00 days | Status: CP
Start: 2023-11-14 — End: ?

## 2023-11-15 ENCOUNTER — Ambulatory Visit
Admit: 2023-11-15 | Discharge: 2023-11-15 | Payer: MEDICARE | Attending: Critical Care Medicine | Primary: Critical Care Medicine

## 2023-11-15 ENCOUNTER — Encounter: Admit: 2023-11-15 | Discharge: 2023-11-15 | Payer: MEDICARE | Attending: Family | Primary: Family

## 2023-11-15 ENCOUNTER — Ambulatory Visit: Admit: 2023-11-15 | Discharge: 2023-11-15 | Payer: MEDICARE | Attending: Primary Care | Primary: Primary Care

## 2023-11-15 ENCOUNTER — Other Ambulatory Visit: Admit: 2023-11-15 | Discharge: 2023-11-15 | Payer: MEDICARE

## 2023-11-15 DIAGNOSIS — N401 Enlarged prostate with lower urinary tract symptoms: Principal | ICD-10-CM

## 2023-11-15 DIAGNOSIS — Z9189 Other specified personal risk factors, not elsewhere classified: Principal | ICD-10-CM

## 2023-11-15 DIAGNOSIS — C9200 Acute myeloblastic leukemia, not having achieved remission: Principal | ICD-10-CM

## 2023-11-15 DIAGNOSIS — Z9484 Stem cells transplant status: Principal | ICD-10-CM

## 2023-11-15 DIAGNOSIS — Z9481 Bone marrow transplant status: Principal | ICD-10-CM

## 2023-11-15 DIAGNOSIS — D649 Anemia, unspecified: Principal | ICD-10-CM

## 2023-11-15 DIAGNOSIS — R350 Frequency of micturition: Principal | ICD-10-CM

## 2023-11-16 ENCOUNTER — Encounter: Admit: 2023-11-16 | Discharge: 2023-11-16 | Payer: MEDICARE | Attending: Family | Primary: Family

## 2023-11-16 DIAGNOSIS — C9200 Acute myeloblastic leukemia, not having achieved remission: Principal | ICD-10-CM

## 2023-11-16 DIAGNOSIS — Z9481 Bone marrow transplant status: Principal | ICD-10-CM

## 2023-11-16 MED ORDER — APIXABAN 5 MG TABLET
ORAL_TABLET | Freq: Two times a day (BID) | ORAL | 0 refills | 30.00 days | Status: CP
Start: 2023-11-16 — End: 2023-12-16

## 2023-11-17 DIAGNOSIS — C9200 Acute myeloblastic leukemia, not having achieved remission: Principal | ICD-10-CM

## 2023-11-19 DIAGNOSIS — C9200 Acute myeloblastic leukemia, not having achieved remission: Principal | ICD-10-CM

## 2023-11-20 ENCOUNTER — Ambulatory Visit: Admit: 2023-11-20 | Discharge: 2023-11-21 | Payer: MEDICARE | Attending: Internal Medicine | Primary: Internal Medicine

## 2023-11-20 ENCOUNTER — Encounter
Admit: 2023-11-20 | Discharge: 2023-11-21 | Payer: MEDICARE | Attending: Student in an Organized Health Care Education/Training Program | Primary: Student in an Organized Health Care Education/Training Program

## 2023-11-20 ENCOUNTER — Other Ambulatory Visit: Admit: 2023-11-20 | Discharge: 2023-11-21 | Payer: MEDICARE

## 2023-11-20 DIAGNOSIS — R799 Abnormal finding of blood chemistry, unspecified: Principal | ICD-10-CM

## 2023-11-20 DIAGNOSIS — C9001 Multiple myeloma in remission: Principal | ICD-10-CM

## 2023-11-20 DIAGNOSIS — C9200 Acute myeloblastic leukemia, not having achieved remission: Principal | ICD-10-CM

## 2023-11-20 DIAGNOSIS — Z7682 Awaiting organ transplant status: Principal | ICD-10-CM

## 2023-11-20 DIAGNOSIS — Z9481 Bone marrow transplant status: Principal | ICD-10-CM

## 2023-11-20 DIAGNOSIS — D84822 Immunocompromised state associated with stem cell transplant (CMS-HCC): Principal | ICD-10-CM

## 2023-11-20 DIAGNOSIS — Z9484 Stem cells transplant status: Principal | ICD-10-CM

## 2023-11-20 MED ORDER — LETERMOVIR 480 MG TABLET
ORAL_TABLET | Freq: Every day | ORAL | 0 refills | 28.00 days | Status: CP
Start: 2023-11-20 — End: ?

## 2023-11-20 MED ORDER — CHOLECALCIFEROL (VITAMIN D3) 25 MCG (1,000 UNIT) TABLET
ORAL_TABLET | Freq: Every day | ORAL | 11 refills | 30.00 days | Status: CP
Start: 2023-11-20 — End: 2024-11-19

## 2023-11-20 MED ORDER — MIRTAZAPINE 15 MG TABLET
ORAL_TABLET | Freq: Every evening | ORAL | 0 refills | 30.00 days | Status: CP
Start: 2023-11-20 — End: ?

## 2023-11-20 MED ORDER — PANTOPRAZOLE 20 MG TABLET,DELAYED RELEASE
ORAL_TABLET | Freq: Two times a day (BID) | ORAL | 3 refills | 90.00 days | Status: CP
Start: 2023-11-20 — End: 2024-11-19

## 2023-11-20 MED ORDER — LOPERAMIDE 2 MG CAPSULE
ORAL_CAPSULE | Freq: Four times a day (QID) | ORAL | 1 refills | 12.00 days | Status: CP | PRN
Start: 2023-11-20 — End: ?

## 2023-11-21 DIAGNOSIS — C9200 Acute myeloblastic leukemia, not having achieved remission: Principal | ICD-10-CM

## 2023-11-21 MED ORDER — SULFAMETHOXAZOLE 800 MG-TRIMETHOPRIM 160 MG TABLET
ORAL_TABLET | ORAL | 0 refills | 28.00 days | Status: CP
Start: 2023-11-21 — End: ?

## 2023-11-22 ENCOUNTER — Ambulatory Visit: Admit: 2023-11-22 | Discharge: 2023-11-22 | Payer: MEDICARE

## 2023-11-22 DIAGNOSIS — R350 Frequency of micturition: Principal | ICD-10-CM

## 2023-11-22 DIAGNOSIS — R339 Retention of urine, unspecified: Principal | ICD-10-CM

## 2023-11-22 DIAGNOSIS — N401 Enlarged prostate with lower urinary tract symptoms: Principal | ICD-10-CM

## 2023-11-23 DIAGNOSIS — C9200 Acute myeloblastic leukemia, not having achieved remission: Principal | ICD-10-CM

## 2023-11-27 ENCOUNTER — Encounter
Admit: 2023-11-27 | Discharge: 2023-11-28 | Payer: MEDICARE | Attending: Student in an Organized Health Care Education/Training Program | Primary: Student in an Organized Health Care Education/Training Program

## 2023-11-27 ENCOUNTER — Other Ambulatory Visit: Admit: 2023-11-27 | Discharge: 2023-11-28 | Payer: MEDICARE

## 2023-11-27 ENCOUNTER — Ambulatory Visit: Admit: 2023-11-27 | Discharge: 2023-11-28 | Payer: MEDICARE

## 2023-11-27 DIAGNOSIS — Z9481 Bone marrow transplant status: Principal | ICD-10-CM

## 2023-11-27 DIAGNOSIS — C9200 Acute myeloblastic leukemia, not having achieved remission: Principal | ICD-10-CM

## 2023-11-27 DIAGNOSIS — D84822 Immunocompromised state associated with stem cell transplant (CMS-HCC): Principal | ICD-10-CM

## 2023-11-27 DIAGNOSIS — Z9484 Stem cells transplant status: Principal | ICD-10-CM

## 2023-11-27 DIAGNOSIS — C9001 Multiple myeloma in remission: Principal | ICD-10-CM

## 2023-11-27 DIAGNOSIS — L0390 Cellulitis, unspecified: Principal | ICD-10-CM

## 2023-11-29 DIAGNOSIS — C9200 Acute myeloblastic leukemia, not having achieved remission: Principal | ICD-10-CM

## 2023-11-29 MED ORDER — VALACYCLOVIR 500 MG TABLET
ORAL_TABLET | Freq: Two times a day (BID) | ORAL | 11 refills | 30.00 days | Status: CP
Start: 2023-11-29 — End: 2024-11-23

## 2023-11-30 ENCOUNTER — Ambulatory Visit: Admit: 2023-11-30 | Discharge: 2023-11-30 | Payer: MEDICARE

## 2023-11-30 DIAGNOSIS — R350 Frequency of micturition: Principal | ICD-10-CM

## 2023-11-30 DIAGNOSIS — N401 Enlarged prostate with lower urinary tract symptoms: Principal | ICD-10-CM

## 2023-11-30 DIAGNOSIS — R339 Retention of urine, unspecified: Principal | ICD-10-CM

## 2023-12-05 ENCOUNTER — Encounter
Admit: 2023-12-05 | Discharge: 2023-12-05 | Payer: MEDICARE | Attending: Student in an Organized Health Care Education/Training Program | Primary: Student in an Organized Health Care Education/Training Program

## 2023-12-05 ENCOUNTER — Other Ambulatory Visit: Admit: 2023-12-05 | Discharge: 2023-12-05 | Payer: MEDICARE

## 2023-12-05 ENCOUNTER — Ambulatory Visit
Admit: 2023-12-05 | Discharge: 2023-12-05 | Payer: MEDICARE | Attending: Nurse Practitioner | Primary: Nurse Practitioner

## 2023-12-05 ENCOUNTER — Ambulatory Visit
Admit: 2023-12-05 | Discharge: 2023-12-05 | Payer: MEDICARE | Attending: Pharmacist Clinician (PhC)/ Clinical Pharmacy Specialist | Primary: Pharmacist Clinician (PhC)/ Clinical Pharmacy Specialist

## 2023-12-05 ENCOUNTER — Ambulatory Visit: Admit: 2023-12-05 | Discharge: 2023-12-05 | Payer: MEDICARE

## 2023-12-05 DIAGNOSIS — C9000 Multiple myeloma not having achieved remission: Principal | ICD-10-CM

## 2023-12-05 DIAGNOSIS — Z9481 Bone marrow transplant status: Principal | ICD-10-CM

## 2023-12-05 DIAGNOSIS — Z9484 Stem cells transplant status: Principal | ICD-10-CM

## 2023-12-05 DIAGNOSIS — C9200 Acute myeloblastic leukemia, not having achieved remission: Principal | ICD-10-CM

## 2023-12-05 DIAGNOSIS — C9201 Acute myeloblastic leukemia, in remission: Principal | ICD-10-CM

## 2023-12-06 DIAGNOSIS — C9200 Acute myeloblastic leukemia, not having achieved remission: Principal | ICD-10-CM

## 2023-12-10 ENCOUNTER — Ambulatory Visit: Admit: 2023-12-10 | Discharge: 2023-12-11 | Payer: MEDICARE

## 2023-12-10 DIAGNOSIS — C9200 Acute myeloblastic leukemia, not having achieved remission: Principal | ICD-10-CM

## 2023-12-10 DIAGNOSIS — R350 Frequency of micturition: Principal | ICD-10-CM

## 2023-12-10 DIAGNOSIS — R339 Retention of urine, unspecified: Principal | ICD-10-CM

## 2023-12-10 DIAGNOSIS — N401 Enlarged prostate with lower urinary tract symptoms: Principal | ICD-10-CM

## 2023-12-11 ENCOUNTER — Ambulatory Visit: Admit: 2023-12-11 | Discharge: 2023-12-12 | Payer: MEDICARE | Attending: Internal Medicine | Primary: Internal Medicine

## 2023-12-11 ENCOUNTER — Other Ambulatory Visit: Admit: 2023-12-11 | Discharge: 2023-12-12 | Payer: MEDICARE

## 2023-12-11 DIAGNOSIS — C9001 Multiple myeloma in remission: Principal | ICD-10-CM

## 2023-12-11 DIAGNOSIS — C9200 Acute myeloblastic leukemia, not having achieved remission: Principal | ICD-10-CM

## 2023-12-11 DIAGNOSIS — Z9484 Stem cells transplant status: Principal | ICD-10-CM

## 2023-12-11 DIAGNOSIS — R799 Abnormal finding of blood chemistry, unspecified: Principal | ICD-10-CM

## 2023-12-11 DIAGNOSIS — D84822 Immunocompromised state associated with stem cell transplant (CMS-HCC): Principal | ICD-10-CM

## 2023-12-14 DIAGNOSIS — C9200 Acute myeloblastic leukemia, not having achieved remission: Principal | ICD-10-CM

## 2023-12-18 ENCOUNTER — Ambulatory Visit: Admit: 2023-12-18 | Discharge: 2023-12-18 | Payer: MEDICARE

## 2023-12-18 ENCOUNTER — Other Ambulatory Visit: Admit: 2023-12-18 | Discharge: 2023-12-18 | Payer: MEDICARE

## 2023-12-18 DIAGNOSIS — C9201 Acute myeloblastic leukemia, in remission: Principal | ICD-10-CM

## 2023-12-18 DIAGNOSIS — C9200 Acute myeloblastic leukemia, not having achieved remission: Principal | ICD-10-CM

## 2023-12-18 DIAGNOSIS — I824Y2 Acute embolism and thrombosis of unspecified deep veins of left proximal lower extremity: Principal | ICD-10-CM

## 2023-12-18 DIAGNOSIS — Z9484 Stem cells transplant status: Principal | ICD-10-CM

## 2023-12-18 DIAGNOSIS — D84822 Immunocompromised state associated with stem cell transplant (CMS-HCC): Principal | ICD-10-CM

## 2023-12-18 MED ORDER — APIXABAN 5 MG TABLET
ORAL_TABLET | Freq: Two times a day (BID) | ORAL | 0 refills | 14.00 days | Status: CP
Start: 2023-12-18 — End: 2024-01-01

## 2023-12-18 MED ORDER — ONDANSETRON 4 MG DISINTEGRATING TABLET
ORAL_TABLET | Freq: Three times a day (TID) | 0 refills | 5.00 days | Status: CP | PRN
Start: 2023-12-18 — End: 2024-01-17

## 2023-12-18 MED ORDER — LETERMOVIR 480 MG TABLET
ORAL_TABLET | Freq: Every day | ORAL | 6 refills | 28.00 days | Status: CP
Start: 2023-12-18 — End: ?

## 2023-12-25 ENCOUNTER — Ambulatory Visit: Admit: 2023-12-25 | Discharge: 2023-12-25 | Payer: MEDICARE | Attending: Primary Care | Primary: Primary Care

## 2023-12-25 ENCOUNTER — Other Ambulatory Visit: Admit: 2023-12-25 | Discharge: 2023-12-25 | Payer: MEDICARE

## 2023-12-25 DIAGNOSIS — Z9484 Stem cells transplant status: Principal | ICD-10-CM

## 2023-12-25 DIAGNOSIS — C9201 Acute myeloblastic leukemia, in remission: Principal | ICD-10-CM

## 2023-12-25 DIAGNOSIS — I824Y2 Acute embolism and thrombosis of unspecified deep veins of left proximal lower extremity: Principal | ICD-10-CM

## 2023-12-25 DIAGNOSIS — C9001 Multiple myeloma in remission: Principal | ICD-10-CM

## 2023-12-25 DIAGNOSIS — C9200 Acute myeloblastic leukemia, not having achieved remission: Principal | ICD-10-CM

## 2023-12-25 DIAGNOSIS — Z9189 Other specified personal risk factors, not elsewhere classified: Principal | ICD-10-CM

## 2023-12-25 DIAGNOSIS — D84822 Immunocompromised state associated with stem cell transplant (CMS-HCC): Principal | ICD-10-CM

## 2023-12-25 DIAGNOSIS — R197 Diarrhea, unspecified: Principal | ICD-10-CM

## 2023-12-25 DIAGNOSIS — R5381 Other malaise: Principal | ICD-10-CM

## 2023-12-25 MED ORDER — TACROLIMUS 1 MG CAPSULE, IMMEDIATE-RELEASE
ORAL_CAPSULE | Freq: Every day | ORAL | 5 refills | 120.00 days | Status: CP
Start: 2023-12-25 — End: ?

## 2023-12-26 DIAGNOSIS — Z9484 Stem cells transplant status: Principal | ICD-10-CM

## 2023-12-26 DIAGNOSIS — C9200 Acute myeloblastic leukemia, not having achieved remission: Principal | ICD-10-CM

## 2023-12-26 MED ORDER — SULFAMETHOXAZOLE 800 MG-TRIMETHOPRIM 160 MG TABLET
ORAL_TABLET | ORAL | 0 refills | 28.00 days | Status: CP
Start: 2023-12-26 — End: ?

## 2023-12-26 MED ORDER — MIRTAZAPINE 15 MG TABLET
ORAL_TABLET | Freq: Every evening | ORAL | 0 refills | 30.00 days | Status: CP
Start: 2023-12-26 — End: ?

## 2024-01-01 ENCOUNTER — Other Ambulatory Visit: Admit: 2024-01-01 | Discharge: 2024-01-01 | Payer: MEDICARE

## 2024-01-01 ENCOUNTER — Encounter: Admit: 2024-01-01 | Discharge: 2024-01-01 | Payer: MEDICARE | Attending: Primary Care | Primary: Primary Care

## 2024-01-01 ENCOUNTER — Ambulatory Visit
Admit: 2024-01-01 | Discharge: 2024-01-01 | Payer: MEDICARE | Attending: Nurse Practitioner | Primary: Nurse Practitioner

## 2024-01-01 DIAGNOSIS — C9201 Acute myeloblastic leukemia, in remission: Principal | ICD-10-CM

## 2024-01-01 DIAGNOSIS — C9200 Acute myeloblastic leukemia, not having achieved remission: Principal | ICD-10-CM

## 2024-01-01 DIAGNOSIS — Z9484 Stem cells transplant status: Principal | ICD-10-CM

## 2024-01-01 DIAGNOSIS — D84822 Immunocompromised state associated with stem cell transplant (CMS-HCC): Principal | ICD-10-CM

## 2024-01-01 DIAGNOSIS — R21 Rash and other nonspecific skin eruption: Principal | ICD-10-CM

## 2024-01-01 MED ORDER — TRIAMCINOLONE ACETONIDE 0.1 % TOPICAL CREAM
Freq: Two times a day (BID) | TOPICAL | 1 refills | 0.00 days | Status: CP
Start: 2024-01-01 — End: 2024-12-31

## 2024-01-02 DIAGNOSIS — C9200 Acute myeloblastic leukemia, not having achieved remission: Principal | ICD-10-CM

## 2024-01-04 ENCOUNTER — Ambulatory Visit: Admit: 2024-01-04 | Discharge: 2024-01-05 | Payer: MEDICARE

## 2024-01-04 DIAGNOSIS — D84822 Immunocompromised state associated with stem cell transplant (CMS-HCC): Principal | ICD-10-CM

## 2024-01-04 DIAGNOSIS — C9201 Acute myeloblastic leukemia, in remission: Principal | ICD-10-CM

## 2024-01-04 DIAGNOSIS — Z9484 Stem cells transplant status: Principal | ICD-10-CM

## 2024-01-07 ENCOUNTER — Encounter: Admit: 2024-01-07 | Discharge: 2024-01-07 | Payer: MEDICARE | Attending: Primary Care | Primary: Primary Care

## 2024-01-08 ENCOUNTER — Other Ambulatory Visit: Admit: 2024-01-08 | Discharge: 2024-01-09 | Payer: MEDICARE

## 2024-01-08 ENCOUNTER — Ambulatory Visit: Admit: 2024-01-08 | Discharge: 2024-01-09 | Payer: MEDICARE

## 2024-01-08 DIAGNOSIS — C9201 Acute myeloblastic leukemia, in remission: Principal | ICD-10-CM

## 2024-01-08 DIAGNOSIS — Z9484 Stem cells transplant status: Principal | ICD-10-CM

## 2024-01-08 DIAGNOSIS — C9200 Acute myeloblastic leukemia, not having achieved remission: Principal | ICD-10-CM

## 2024-01-08 DIAGNOSIS — D84822 Immunocompromised state associated with stem cell transplant (CMS-HCC): Principal | ICD-10-CM

## 2024-01-08 MED ORDER — PREDNISONE 10 MG TABLET
ORAL_TABLET | Freq: Every day | ORAL | 0 refills | 150.00 days | Status: CP
Start: 2024-01-08 — End: ?

## 2024-01-09 ENCOUNTER — Ambulatory Visit: Admit: 2024-01-09 | Discharge: 2024-01-10 | Payer: MEDICARE

## 2024-01-09 DIAGNOSIS — R350 Frequency of micturition: Principal | ICD-10-CM

## 2024-01-09 DIAGNOSIS — C9200 Acute myeloblastic leukemia, not having achieved remission: Principal | ICD-10-CM

## 2024-01-11 DIAGNOSIS — C9200 Acute myeloblastic leukemia, not having achieved remission: Principal | ICD-10-CM

## 2024-01-14 ENCOUNTER — Ambulatory Visit
Admit: 2024-01-14 | Discharge: 2024-01-14 | Payer: MEDICARE | Attending: Critical Care Medicine | Primary: Critical Care Medicine

## 2024-01-14 ENCOUNTER — Other Ambulatory Visit: Admit: 2024-01-14 | Discharge: 2024-01-14 | Payer: MEDICARE

## 2024-01-14 DIAGNOSIS — C9200 Acute myeloblastic leukemia, not having achieved remission: Principal | ICD-10-CM

## 2024-01-14 DIAGNOSIS — R5381 Other malaise: Principal | ICD-10-CM

## 2024-01-14 DIAGNOSIS — Z9484 Stem cells transplant status: Principal | ICD-10-CM

## 2024-01-14 DIAGNOSIS — R21 Rash and other nonspecific skin eruption: Principal | ICD-10-CM

## 2024-01-14 DIAGNOSIS — Z9189 Other specified personal risk factors, not elsewhere classified: Principal | ICD-10-CM

## 2024-01-15 DIAGNOSIS — Z9484 Stem cells transplant status: Principal | ICD-10-CM

## 2024-01-15 DIAGNOSIS — C9200 Acute myeloblastic leukemia, not having achieved remission: Principal | ICD-10-CM

## 2024-01-16 MED ORDER — SULFAMETHOXAZOLE 800 MG-TRIMETHOPRIM 160 MG TABLET
ORAL_TABLET | ORAL | 1 refills | 28.00 days | Status: CP
Start: 2024-01-16 — End: ?

## 2024-01-22 ENCOUNTER — Ambulatory Visit: Admit: 2024-01-22 | Discharge: 2024-01-22 | Payer: MEDICARE | Attending: Family | Primary: Family

## 2024-01-22 ENCOUNTER — Other Ambulatory Visit: Admit: 2024-01-22 | Discharge: 2024-01-22 | Payer: MEDICARE

## 2024-01-22 DIAGNOSIS — C9200 Acute myeloblastic leukemia, not having achieved remission: Principal | ICD-10-CM

## 2024-01-22 DIAGNOSIS — Z9481 Bone marrow transplant status: Principal | ICD-10-CM

## 2024-01-22 DIAGNOSIS — Z9484 Stem cells transplant status: Principal | ICD-10-CM

## 2024-01-22 DIAGNOSIS — Z9189 Other specified personal risk factors, not elsewhere classified: Principal | ICD-10-CM

## 2024-01-29 ENCOUNTER — Ambulatory Visit: Admit: 2024-01-29 | Discharge: 2024-01-29 | Payer: MEDICARE | Attending: Internal Medicine | Primary: Internal Medicine

## 2024-01-29 ENCOUNTER — Other Ambulatory Visit: Admit: 2024-01-29 | Discharge: 2024-01-29 | Payer: MEDICARE

## 2024-01-29 DIAGNOSIS — Z9484 Stem cells transplant status: Principal | ICD-10-CM

## 2024-01-29 DIAGNOSIS — C9200 Acute myeloblastic leukemia, not having achieved remission: Principal | ICD-10-CM

## 2024-01-29 DIAGNOSIS — Z9481 Bone marrow transplant status: Principal | ICD-10-CM

## 2024-01-29 DIAGNOSIS — C9001 Multiple myeloma in remission: Principal | ICD-10-CM

## 2024-01-29 DIAGNOSIS — R799 Abnormal finding of blood chemistry, unspecified: Principal | ICD-10-CM

## 2024-01-29 DIAGNOSIS — D84822 Immunocompromised state associated with stem cell transplant (CMS-HCC): Principal | ICD-10-CM

## 2024-01-29 MED ORDER — MIRTAZAPINE 15 MG TABLET
ORAL_TABLET | Freq: Every evening | ORAL | 3 refills | 30.00 days | Status: CP
Start: 2024-01-29 — End: ?

## 2024-01-30 DIAGNOSIS — C9200 Acute myeloblastic leukemia, not having achieved remission: Principal | ICD-10-CM

## 2024-02-05 DIAGNOSIS — C9200 Acute myeloblastic leukemia, not having achieved remission: Principal | ICD-10-CM

## 2024-02-06 ENCOUNTER — Ambulatory Visit: Admit: 2024-02-06

## 2024-02-06 ENCOUNTER — Ambulatory Visit: Admit: 2024-02-06 | Payer: MEDICARE

## 2024-02-12 ENCOUNTER — Ambulatory Visit: Admit: 2024-02-12 | Discharge: 2024-02-13 | Payer: MEDICARE | Attending: Internal Medicine | Primary: Internal Medicine

## 2024-02-12 ENCOUNTER — Other Ambulatory Visit: Admit: 2024-02-12 | Discharge: 2024-02-13 | Payer: MEDICARE

## 2024-02-12 DIAGNOSIS — C9200 Acute myeloblastic leukemia, not having achieved remission: Principal | ICD-10-CM

## 2024-02-12 DIAGNOSIS — C9001 Multiple myeloma in remission: Principal | ICD-10-CM

## 2024-02-12 DIAGNOSIS — Z9481 Bone marrow transplant status: Principal | ICD-10-CM

## 2024-02-12 DIAGNOSIS — R799 Abnormal finding of blood chemistry, unspecified: Principal | ICD-10-CM

## 2024-02-12 DIAGNOSIS — Z9484 Stem cells transplant status: Principal | ICD-10-CM

## 2024-02-12 DIAGNOSIS — D84822 Immunocompromised state associated with stem cell transplant (CMS-HCC): Principal | ICD-10-CM

## 2024-02-19 DIAGNOSIS — R799 Abnormal finding of blood chemistry, unspecified: Principal | ICD-10-CM

## 2024-02-19 DIAGNOSIS — C9202 Acute myeloblastic leukemia, in relapse: Principal | ICD-10-CM

## 2024-02-19 DIAGNOSIS — R5382 Chronic fatigue, unspecified: Principal | ICD-10-CM

## 2024-02-19 DIAGNOSIS — Z9484 Stem cells transplant status: Principal | ICD-10-CM

## 2024-02-19 DIAGNOSIS — J984 Other disorders of lung: Principal | ICD-10-CM

## 2024-02-19 DIAGNOSIS — R0600 Dyspnea, unspecified: Principal | ICD-10-CM

## 2024-02-19 DIAGNOSIS — E559 Vitamin D deficiency, unspecified: Principal | ICD-10-CM

## 2024-02-19 DIAGNOSIS — C9200 Acute myeloblastic leukemia, not having achieved remission: Principal | ICD-10-CM

## 2024-02-20 ENCOUNTER — Ambulatory Visit: Admit: 2024-02-20 | Discharge: 2024-02-21 | Payer: MEDICARE

## 2024-02-20 ENCOUNTER — Other Ambulatory Visit: Admit: 2024-02-20 | Discharge: 2024-02-21 | Payer: MEDICARE

## 2024-02-20 ENCOUNTER — Ambulatory Visit: Admit: 2024-02-20 | Discharge: 2024-02-21 | Payer: MEDICARE | Attending: Oncology | Primary: Oncology

## 2024-02-20 DIAGNOSIS — Z9481 Bone marrow transplant status: Principal | ICD-10-CM

## 2024-02-20 DIAGNOSIS — R5382 Chronic fatigue, unspecified: Principal | ICD-10-CM

## 2024-02-20 DIAGNOSIS — Z9484 Stem cells transplant status: Principal | ICD-10-CM

## 2024-02-20 DIAGNOSIS — C9001 Multiple myeloma in remission: Principal | ICD-10-CM

## 2024-02-20 DIAGNOSIS — E559 Vitamin D deficiency, unspecified: Principal | ICD-10-CM

## 2024-02-20 DIAGNOSIS — C9201 Acute myeloblastic leukemia, in remission: Principal | ICD-10-CM

## 2024-02-20 DIAGNOSIS — R0600 Dyspnea, unspecified: Principal | ICD-10-CM

## 2024-02-20 DIAGNOSIS — C9202 Acute myeloblastic leukemia, in relapse: Principal | ICD-10-CM

## 2024-02-20 DIAGNOSIS — Z23 Encounter for immunization: Principal | ICD-10-CM

## 2024-02-20 DIAGNOSIS — C9200 Acute myeloblastic leukemia, not having achieved remission: Principal | ICD-10-CM

## 2024-02-20 DIAGNOSIS — J984 Other disorders of lung: Principal | ICD-10-CM

## 2024-02-20 DIAGNOSIS — R799 Abnormal finding of blood chemistry, unspecified: Principal | ICD-10-CM

## 2024-02-20 MED ORDER — SHINGRIX ADJUVANT COMPONENT (PF) INTRAMUSCULAR SUSPENSION
Freq: Once | INTRAMUSCULAR | 1 refills | 1 days | Status: CP
Start: 2024-02-20 — End: 2024-02-20

## 2024-02-20 MED ORDER — FERROUS SULFATE 325 MG (65 MG IRON) TABLET,DELAYED RELEASE
ORAL_TABLET | Freq: Every day | ORAL | 3 refills | 30 days | Status: CP
Start: 2024-02-20 — End: ?

## 2024-02-25 DIAGNOSIS — C9200 Acute myeloblastic leukemia, not having achieved remission: Principal | ICD-10-CM

## 2024-02-26 ENCOUNTER — Other Ambulatory Visit: Admit: 2024-02-26 | Discharge: 2024-02-27

## 2024-02-26 ENCOUNTER — Encounter: Admit: 2024-02-26 | Discharge: 2024-02-27 | Attending: Internal Medicine | Primary: Internal Medicine

## 2024-02-26 ENCOUNTER — Ambulatory Visit: Admit: 2024-02-26 | Discharge: 2024-02-27 | Attending: Internal Medicine | Primary: Internal Medicine

## 2024-02-26 DIAGNOSIS — D84822 Immunocompromised state associated with stem cell transplant: Principal | ICD-10-CM

## 2024-02-26 DIAGNOSIS — Z9484 Stem cells transplant status: Principal | ICD-10-CM

## 2024-02-26 DIAGNOSIS — R799 Abnormal finding of blood chemistry, unspecified: Principal | ICD-10-CM

## 2024-02-26 DIAGNOSIS — C9200 Acute myeloblastic leukemia, not having achieved remission: Principal | ICD-10-CM

## 2024-02-26 DIAGNOSIS — Z9481 Bone marrow transplant status: Principal | ICD-10-CM

## 2024-02-26 DIAGNOSIS — C9001 Multiple myeloma in remission: Principal | ICD-10-CM

## 2024-02-28 DIAGNOSIS — C9200 Acute myeloblastic leukemia, not having achieved remission: Principal | ICD-10-CM

## 2024-03-04 ENCOUNTER — Ambulatory Visit: Admit: 2024-03-04 | Discharge: 2024-03-05 | Attending: Internal Medicine | Primary: Internal Medicine

## 2024-03-04 ENCOUNTER — Other Ambulatory Visit: Admit: 2024-03-04 | Discharge: 2024-03-05

## 2024-03-04 DIAGNOSIS — C9201 Acute myeloblastic leukemia, in remission: Principal | ICD-10-CM

## 2024-03-04 DIAGNOSIS — D84822 Immunocompromised state associated with stem cell transplant: Principal | ICD-10-CM

## 2024-03-04 DIAGNOSIS — C9200 Acute myeloblastic leukemia, not having achieved remission: Principal | ICD-10-CM

## 2024-03-04 DIAGNOSIS — Z9481 Bone marrow transplant status: Principal | ICD-10-CM

## 2024-03-04 DIAGNOSIS — Z9484 Stem cells transplant status: Principal | ICD-10-CM

## 2024-03-04 DIAGNOSIS — R799 Abnormal finding of blood chemistry, unspecified: Principal | ICD-10-CM

## 2024-03-04 DIAGNOSIS — C9001 Multiple myeloma in remission: Principal | ICD-10-CM

## 2024-03-05 ENCOUNTER — Encounter: Admit: 2024-03-05 | Discharge: 2024-03-05 | Attending: Internal Medicine | Primary: Internal Medicine

## 2024-03-06 DIAGNOSIS — C9200 Acute myeloblastic leukemia, not having achieved remission: Principal | ICD-10-CM

## 2024-03-07 ENCOUNTER — Ambulatory Visit: Admit: 2024-03-07 | Payer: Medicare (Managed Care)

## 2024-03-07 ENCOUNTER — Ambulatory Visit: Admit: 2024-03-07 | Discharge: 2024-04-05 | Payer: Medicare (Managed Care)

## 2024-03-10 DIAGNOSIS — C9200 Acute myeloblastic leukemia, not having achieved remission: Principal | ICD-10-CM

## 2024-03-11 DIAGNOSIS — C9200 Acute myeloblastic leukemia, not having achieved remission: Principal | ICD-10-CM

## 2024-03-17 DIAGNOSIS — C9200 Acute myeloblastic leukemia, not having achieved remission: Principal | ICD-10-CM

## 2024-03-17 DIAGNOSIS — Z9484 Stem cells transplant status: Principal | ICD-10-CM

## 2024-03-17 MED ORDER — SULFAMETHOXAZOLE 800 MG-TRIMETHOPRIM 160 MG TABLET
ORAL_TABLET | ORAL | 1 refills | 28.00 days | Status: CP
Start: 2024-03-17 — End: ?

## 2024-03-18 ENCOUNTER — Other Ambulatory Visit: Admit: 2024-03-18 | Discharge: 2024-03-19 | Payer: Medicare (Managed Care)

## 2024-03-18 ENCOUNTER — Ambulatory Visit
Admit: 2024-03-18 | Discharge: 2024-03-19 | Payer: Medicare (Managed Care) | Attending: Internal Medicine | Primary: Internal Medicine

## 2024-03-18 ENCOUNTER — Encounter: Admit: 2024-03-18 | Discharge: 2024-03-19 | Payer: Medicare (Managed Care)

## 2024-03-18 DIAGNOSIS — C9001 Multiple myeloma in remission: Principal | ICD-10-CM

## 2024-03-18 DIAGNOSIS — C9200 Acute myeloblastic leukemia, not having achieved remission: Principal | ICD-10-CM

## 2024-03-18 DIAGNOSIS — D84822 Immunocompromised state associated with stem cell transplant: Principal | ICD-10-CM

## 2024-03-18 DIAGNOSIS — R799 Abnormal finding of blood chemistry, unspecified: Principal | ICD-10-CM

## 2024-03-18 DIAGNOSIS — Z9484 Stem cells transplant status: Principal | ICD-10-CM

## 2024-03-20 DIAGNOSIS — C9200 Acute myeloblastic leukemia, not having achieved remission: Principal | ICD-10-CM

## 2024-03-21 DIAGNOSIS — C9200 Acute myeloblastic leukemia, not having achieved remission: Principal | ICD-10-CM

## 2024-03-21 DIAGNOSIS — C9000 Multiple myeloma not having achieved remission: Principal | ICD-10-CM

## 2024-03-25 ENCOUNTER — Other Ambulatory Visit: Admit: 2024-03-25 | Discharge: 2024-03-26 | Payer: Medicare (Managed Care)

## 2024-03-25 ENCOUNTER — Inpatient Hospital Stay: Admit: 2024-03-25 | Discharge: 2024-03-26 | Payer: Medicare (Managed Care)

## 2024-03-25 ENCOUNTER — Ambulatory Visit
Admit: 2024-03-25 | Discharge: 2024-03-26 | Payer: Medicare (Managed Care) | Attending: Internal Medicine | Primary: Internal Medicine

## 2024-03-25 DIAGNOSIS — R799 Abnormal finding of blood chemistry, unspecified: Principal | ICD-10-CM

## 2024-03-25 DIAGNOSIS — Z9484 Stem cells transplant status: Principal | ICD-10-CM

## 2024-03-25 DIAGNOSIS — C9200 Acute myeloblastic leukemia, not having achieved remission: Principal | ICD-10-CM

## 2024-03-25 DIAGNOSIS — D84822 Immunocompromised state associated with stem cell transplant: Principal | ICD-10-CM

## 2024-03-25 DIAGNOSIS — Z9189 Other specified personal risk factors, not elsewhere classified: Principal | ICD-10-CM

## 2024-03-25 DIAGNOSIS — Z9481 Bone marrow transplant status: Principal | ICD-10-CM

## 2024-03-25 DIAGNOSIS — Z7409 Other reduced mobility: Principal | ICD-10-CM

## 2024-03-25 DIAGNOSIS — C9001 Multiple myeloma in remission: Principal | ICD-10-CM

## 2024-03-26 DIAGNOSIS — R350 Frequency of micturition: Principal | ICD-10-CM

## 2024-03-26 DIAGNOSIS — C9200 Acute myeloblastic leukemia, not having achieved remission: Principal | ICD-10-CM

## 2024-03-26 DIAGNOSIS — N401 Enlarged prostate with lower urinary tract symptoms: Principal | ICD-10-CM

## 2024-03-26 DIAGNOSIS — Z9484 Stem cells transplant status: Principal | ICD-10-CM

## 2024-04-02 ENCOUNTER — Ambulatory Visit
Admit: 2024-04-02 | Discharge: 2024-04-03 | Payer: Medicare (Managed Care) | Attending: Infectious Disease | Primary: Infectious Disease

## 2024-04-02 DIAGNOSIS — C9200 Acute myeloblastic leukemia, not having achieved remission: Principal | ICD-10-CM

## 2024-04-02 DIAGNOSIS — R350 Frequency of micturition: Principal | ICD-10-CM

## 2024-04-04 DIAGNOSIS — C9200 Acute myeloblastic leukemia, not having achieved remission: Principal | ICD-10-CM

## 2024-04-04 DIAGNOSIS — C9201 Acute myeloblastic leukemia, in remission: Principal | ICD-10-CM

## 2024-04-04 DIAGNOSIS — Z9481 Bone marrow transplant status: Principal | ICD-10-CM

## 2024-04-04 DIAGNOSIS — N3090 Cystitis, unspecified without hematuria: Principal | ICD-10-CM

## 2024-04-04 DIAGNOSIS — C9001 Multiple myeloma in remission: Principal | ICD-10-CM

## 2024-04-04 DIAGNOSIS — Z9484 Stem cells transplant status: Principal | ICD-10-CM

## 2024-04-04 MED ORDER — NITROFURANTOIN MONOHYDRATE/MACROCRYSTALS 100 MG CAPSULE
ORAL_CAPSULE | Freq: Two times a day (BID) | ORAL | 0 refills | 7.00000 days | Status: CP
Start: 2024-04-04 — End: 2024-04-11

## 2024-04-08 ENCOUNTER — Other Ambulatory Visit: Admit: 2024-04-08 | Discharge: 2024-04-09 | Payer: Medicare (Managed Care)

## 2024-04-08 ENCOUNTER — Ambulatory Visit
Admit: 2024-04-08 | Discharge: 2024-04-09 | Payer: Medicare (Managed Care) | Attending: Internal Medicine | Primary: Internal Medicine

## 2024-04-08 DIAGNOSIS — C9001 Multiple myeloma in remission: Principal | ICD-10-CM

## 2024-04-08 DIAGNOSIS — Z9481 Bone marrow transplant status: Principal | ICD-10-CM

## 2024-04-08 DIAGNOSIS — R799 Abnormal finding of blood chemistry, unspecified: Principal | ICD-10-CM

## 2024-04-08 DIAGNOSIS — C9201 Acute myeloblastic leukemia, in remission: Principal | ICD-10-CM

## 2024-04-08 DIAGNOSIS — D84822 Immunocompromised state associated with stem cell transplant: Principal | ICD-10-CM

## 2024-04-08 DIAGNOSIS — C9200 Acute myeloblastic leukemia, not having achieved remission: Principal | ICD-10-CM

## 2024-04-08 DIAGNOSIS — Z9484 Stem cells transplant status: Principal | ICD-10-CM

## 2024-04-11 ENCOUNTER — Ambulatory Visit: Admit: 2024-04-11 | Discharge: 2024-05-05 | Payer: Medicare (Managed Care)

## 2024-04-11 ENCOUNTER — Ambulatory Visit: Admit: 2024-04-11 | Payer: Medicare (Managed Care)

## 2024-04-14 DIAGNOSIS — C9200 Acute myeloblastic leukemia, not having achieved remission: Principal | ICD-10-CM

## 2024-04-15 ENCOUNTER — Ambulatory Visit: Admit: 2024-04-15 | Payer: Medicare (Managed Care)

## 2024-04-15 DIAGNOSIS — C9200 Acute myeloblastic leukemia, not having achieved remission: Principal | ICD-10-CM

## 2024-04-28 ENCOUNTER — Ambulatory Visit: Admit: 2024-04-28 | Payer: Medicare (Managed Care)

## 2024-04-29 ENCOUNTER — Other Ambulatory Visit: Admit: 2024-04-29 | Discharge: 2024-04-30 | Payer: Medicare (Managed Care)

## 2024-04-29 ENCOUNTER — Ambulatory Visit
Admit: 2024-04-29 | Discharge: 2024-04-30 | Payer: Medicare (Managed Care) | Attending: Internal Medicine | Primary: Internal Medicine

## 2024-04-29 DIAGNOSIS — C9201 Acute myeloblastic leukemia, in remission: Principal | ICD-10-CM

## 2024-04-29 DIAGNOSIS — C9200 Acute myeloblastic leukemia, not having achieved remission: Principal | ICD-10-CM

## 2024-04-29 DIAGNOSIS — R799 Abnormal finding of blood chemistry, unspecified: Principal | ICD-10-CM

## 2024-04-29 DIAGNOSIS — C9001 Multiple myeloma in remission: Principal | ICD-10-CM

## 2024-04-29 DIAGNOSIS — Z9484 Stem cells transplant status: Principal | ICD-10-CM

## 2024-04-29 DIAGNOSIS — D84822 Immunocompromised state associated with stem cell transplant: Principal | ICD-10-CM

## 2024-05-09 DIAGNOSIS — C9200 Acute myeloblastic leukemia, not having achieved remission: Principal | ICD-10-CM

## 2024-06-02 DIAGNOSIS — C9200 Acute myeloblastic leukemia, not having achieved remission: Principal | ICD-10-CM

## 2024-06-03 ENCOUNTER — Ambulatory Visit
Admit: 2024-06-03 | Discharge: 2024-06-03 | Payer: Medicare (Managed Care) | Attending: Internal Medicine | Primary: Internal Medicine

## 2024-06-03 ENCOUNTER — Encounter
Admit: 2024-06-03 | Discharge: 2024-06-03 | Payer: Medicare (Managed Care) | Attending: Internal Medicine | Primary: Internal Medicine

## 2024-06-03 ENCOUNTER — Other Ambulatory Visit: Admit: 2024-06-03 | Discharge: 2024-06-03 | Payer: Medicare (Managed Care)

## 2024-06-03 DIAGNOSIS — C9001 Multiple myeloma in remission: Principal | ICD-10-CM

## 2024-06-03 DIAGNOSIS — C9200 Acute myeloblastic leukemia, not having achieved remission: Principal | ICD-10-CM

## 2024-06-03 DIAGNOSIS — C9000 Multiple myeloma not having achieved remission: Principal | ICD-10-CM

## 2024-06-03 DIAGNOSIS — Z9481 Bone marrow transplant status: Principal | ICD-10-CM

## 2024-06-04 DIAGNOSIS — C9200 Acute myeloblastic leukemia, not having achieved remission: Principal | ICD-10-CM

## 2024-06-06 ENCOUNTER — Encounter
Admit: 2024-06-06 | Discharge: 2024-06-06 | Payer: Medicare (Managed Care) | Attending: Internal Medicine | Primary: Internal Medicine

## 2024-06-07 DIAGNOSIS — C9200 Acute myeloblastic leukemia, not having achieved remission: Principal | ICD-10-CM

## 2024-06-12 ENCOUNTER — Encounter
Admit: 2024-06-12 | Discharge: 2024-06-12 | Payer: Medicare (Managed Care) | Attending: Student in an Organized Health Care Education/Training Program | Primary: Student in an Organized Health Care Education/Training Program

## 2024-06-12 DIAGNOSIS — R634 Abnormal weight loss: Principal | ICD-10-CM

## 2024-06-13 ENCOUNTER — Encounter
Admit: 2024-06-13 | Discharge: 2024-06-13 | Payer: Medicare (Managed Care) | Attending: Student in an Organized Health Care Education/Training Program | Primary: Student in an Organized Health Care Education/Training Program

## 2024-06-13 DIAGNOSIS — R634 Abnormal weight loss: Principal | ICD-10-CM

## 2024-06-13 DIAGNOSIS — C9200 Acute myeloblastic leukemia, not having achieved remission: Principal | ICD-10-CM

## 2024-06-14 DIAGNOSIS — C9200 Acute myeloblastic leukemia, not having achieved remission: Principal | ICD-10-CM

## 2024-06-18 ENCOUNTER — Encounter: Admit: 2024-06-18 | Discharge: 2024-06-19 | Payer: Medicare (Managed Care)

## 2024-06-18 DIAGNOSIS — C9200 Acute myeloblastic leukemia, not having achieved remission: Principal | ICD-10-CM

## 2024-06-18 DIAGNOSIS — C9000 Multiple myeloma not having achieved remission: Principal | ICD-10-CM

## 2024-06-20 DIAGNOSIS — C9200 Acute myeloblastic leukemia, not having achieved remission: Principal | ICD-10-CM

## 2024-06-20 DIAGNOSIS — C9000 Multiple myeloma not having achieved remission: Principal | ICD-10-CM

## 2024-06-23 DIAGNOSIS — C9200 Acute myeloblastic leukemia, not having achieved remission: Principal | ICD-10-CM

## 2024-07-08 ENCOUNTER — Ambulatory Visit
Admit: 2024-07-08 | Discharge: 2024-07-09 | Payer: Medicare (Managed Care) | Attending: Internal Medicine | Primary: Internal Medicine

## 2024-07-08 ENCOUNTER — Other Ambulatory Visit: Admit: 2024-07-08 | Discharge: 2024-07-09 | Payer: Medicare (Managed Care)

## 2024-07-08 DIAGNOSIS — C9200 Acute myeloblastic leukemia, not having achieved remission: Principal | ICD-10-CM

## 2024-07-08 DIAGNOSIS — Z9484 Stem cells transplant status: Principal | ICD-10-CM

## 2024-07-08 DIAGNOSIS — D84822 Immunocompromised state associated with stem cell transplant: Principal | ICD-10-CM

## 2024-07-08 DIAGNOSIS — Z9481 Bone marrow transplant status: Principal | ICD-10-CM

## 2024-07-08 DIAGNOSIS — C9001 Multiple myeloma in remission: Principal | ICD-10-CM

## 2024-07-09 ENCOUNTER — Ambulatory Visit: Admit: 2024-07-09 | Discharge: 2024-07-10 | Payer: Medicare (Managed Care)

## 2024-07-09 DIAGNOSIS — N401 Enlarged prostate with lower urinary tract symptoms: Principal | ICD-10-CM

## 2024-07-09 DIAGNOSIS — C9200 Acute myeloblastic leukemia, not having achieved remission: Principal | ICD-10-CM

## 2024-07-09 DIAGNOSIS — N319 Neuromuscular dysfunction of bladder, unspecified: Principal | ICD-10-CM

## 2024-07-09 DIAGNOSIS — R350 Frequency of micturition: Principal | ICD-10-CM

## 2024-08-04 DIAGNOSIS — C9200 Acute myeloblastic leukemia, not having achieved remission: Principal | ICD-10-CM

## 2024-08-06 ENCOUNTER — Other Ambulatory Visit: Admit: 2024-08-06 | Discharge: 2024-08-06 | Payer: Medicare (Managed Care)

## 2024-08-06 ENCOUNTER — Ambulatory Visit
Admit: 2024-08-06 | Discharge: 2024-08-06 | Payer: Medicare (Managed Care) | Attending: Critical Care Medicine | Primary: Critical Care Medicine

## 2024-08-06 DIAGNOSIS — C9200 Acute myeloblastic leukemia, not having achieved remission: Principal | ICD-10-CM

## 2024-08-06 DIAGNOSIS — D8489 Other immunodeficiencies (HHS-HCC): Principal | ICD-10-CM

## 2024-08-06 DIAGNOSIS — Z9484 Stem cells transplant status: Principal | ICD-10-CM

## 2024-08-06 DIAGNOSIS — R197 Diarrhea, unspecified: Principal | ICD-10-CM

## 2024-08-06 DIAGNOSIS — E876 Hypokalemia: Principal | ICD-10-CM

## 2024-08-07 ENCOUNTER — Ambulatory Visit: Admit: 2024-08-07 | Discharge: 2024-08-08 | Payer: Medicare (Managed Care)

## 2024-08-07 MED ORDER — ALFUZOSIN ER 10 MG TABLET,EXTENDED RELEASE 24 HR
ORAL_TABLET | Freq: Every day | ORAL | 90.00000 days
Start: 2024-08-07 — End: ?

## 2024-08-08 DIAGNOSIS — C9200 Acute myeloblastic leukemia, not having achieved remission: Principal | ICD-10-CM

## 2024-08-11 DIAGNOSIS — Z9481 Bone marrow transplant status: Principal | ICD-10-CM

## 2024-08-11 MED ORDER — CEPHALEXIN 500 MG CAPSULE
ORAL_CAPSULE | Freq: Once | ORAL | 1 refills | 0.00000 days | Status: CP | PRN
Start: 2024-08-11 — End: ?

## 2024-08-14 ENCOUNTER — Other Ambulatory Visit: Admit: 2024-08-14 | Discharge: 2024-08-14 | Payer: Medicare (Managed Care)

## 2024-08-14 ENCOUNTER — Encounter
Admit: 2024-08-14 | Discharge: 2024-08-14 | Payer: Medicare (Managed Care) | Attending: Internal Medicine | Primary: Internal Medicine

## 2024-08-14 ENCOUNTER — Inpatient Hospital Stay
Admit: 2024-08-14 | Discharge: 2024-08-14 | Payer: Medicare (Managed Care) | Attending: Primary Care | Primary: Primary Care

## 2024-08-14 ENCOUNTER — Encounter
Admit: 2024-08-14 | Discharge: 2024-08-14 | Payer: Medicare (Managed Care) | Attending: Primary Care | Primary: Primary Care

## 2024-08-14 DIAGNOSIS — C9200 Acute myeloblastic leukemia, not having achieved remission: Principal | ICD-10-CM

## 2024-08-15 ENCOUNTER — Encounter
Admit: 2024-08-15 | Discharge: 2024-08-15 | Payer: Medicare (Managed Care) | Attending: Internal Medicine | Primary: Internal Medicine

## 2024-08-15 DIAGNOSIS — C9200 Acute myeloblastic leukemia, not having achieved remission: Principal | ICD-10-CM

## 2024-08-16 DIAGNOSIS — C9200 Acute myeloblastic leukemia, not having achieved remission: Principal | ICD-10-CM

## 2024-08-18 DIAGNOSIS — C9200 Acute myeloblastic leukemia, not having achieved remission: Principal | ICD-10-CM

## 2024-08-19 ENCOUNTER — Other Ambulatory Visit: Admit: 2024-08-19 | Discharge: 2024-08-20 | Payer: Medicare (Managed Care)

## 2024-08-19 ENCOUNTER — Encounter
Admit: 2024-08-19 | Discharge: 2024-08-20 | Payer: Medicare (Managed Care) | Attending: Internal Medicine | Primary: Internal Medicine

## 2024-08-19 ENCOUNTER — Ambulatory Visit
Admit: 2024-08-19 | Discharge: 2024-08-20 | Payer: Medicare (Managed Care) | Attending: Internal Medicine | Primary: Internal Medicine

## 2024-08-19 DIAGNOSIS — C9200 Acute myeloblastic leukemia, not having achieved remission: Principal | ICD-10-CM

## 2024-08-19 DIAGNOSIS — Z9481 Bone marrow transplant status: Principal | ICD-10-CM

## 2024-08-19 DIAGNOSIS — Z23 Encounter for immunization: Principal | ICD-10-CM

## 2024-08-19 DIAGNOSIS — Z9484 Stem cells transplant status: Principal | ICD-10-CM

## 2024-08-19 DIAGNOSIS — C9001 Multiple myeloma in remission: Principal | ICD-10-CM

## 2024-08-19 MED ORDER — DIPHENOXYLATE-ATROPINE 2.5 MG-0.025 MG TABLET
ORAL_TABLET | Freq: Four times a day (QID) | ORAL | 0 refills | 8.00000 days | Status: CP | PRN
Start: 2024-08-19 — End: ?

## 2024-08-20 DIAGNOSIS — C9200 Acute myeloblastic leukemia, not having achieved remission: Principal | ICD-10-CM

## 2024-08-26 ENCOUNTER — Encounter
Admit: 2024-08-26 | Discharge: 2024-08-26 | Payer: Medicare (Managed Care) | Attending: Internal Medicine | Primary: Internal Medicine

## 2024-08-27 DIAGNOSIS — C9200 Acute myeloblastic leukemia, not having achieved remission: Principal | ICD-10-CM

## 2024-09-16 ENCOUNTER — Ambulatory Visit
Admit: 2024-09-16 | Discharge: 2024-09-17 | Payer: Medicare (Managed Care) | Attending: Internal Medicine | Primary: Internal Medicine

## 2024-09-16 ENCOUNTER — Encounter: Admit: 2024-09-16 | Discharge: 2024-09-17 | Payer: Medicare (Managed Care)

## 2024-09-16 ENCOUNTER — Other Ambulatory Visit: Admit: 2024-09-16 | Discharge: 2024-09-17 | Payer: Medicare (Managed Care)

## 2024-09-16 DIAGNOSIS — C9000 Multiple myeloma not having achieved remission: Principal | ICD-10-CM

## 2024-09-16 DIAGNOSIS — C9200 Acute myeloblastic leukemia, not having achieved remission: Principal | ICD-10-CM

## 2024-09-16 DIAGNOSIS — R799 Abnormal finding of blood chemistry, unspecified: Principal | ICD-10-CM

## 2024-09-16 DIAGNOSIS — Z9484 Stem cells transplant status: Principal | ICD-10-CM

## 2024-09-16 DIAGNOSIS — Z9481 Bone marrow transplant status: Principal | ICD-10-CM

## 2024-09-16 DIAGNOSIS — C9001 Multiple myeloma in remission: Principal | ICD-10-CM

## 2024-09-16 MED ORDER — CEPHALEXIN 500 MG CAPSULE
ORAL_CAPSULE | Freq: Once | ORAL | 1 refills | 0.00000 days | Status: CP | PRN
Start: 2024-09-16 — End: ?

## 2024-09-17 DIAGNOSIS — C9200 Acute myeloblastic leukemia, not having achieved remission: Principal | ICD-10-CM

## 2024-09-18 DIAGNOSIS — R634 Abnormal weight loss: Principal | ICD-10-CM

## 2024-09-18 DIAGNOSIS — C9000 Multiple myeloma not having achieved remission: Principal | ICD-10-CM

## 2024-09-18 DIAGNOSIS — C9200 Acute myeloblastic leukemia, not having achieved remission: Principal | ICD-10-CM

## 2024-09-19 DIAGNOSIS — C9200 Acute myeloblastic leukemia, not having achieved remission: Principal | ICD-10-CM

## 2024-10-08 DIAGNOSIS — C9200 Acute myeloblastic leukemia, not having achieved remission: Principal | ICD-10-CM

## 2024-10-10 ENCOUNTER — Encounter: Admit: 2024-10-10 | Discharge: 2024-10-10 | Payer: Medicare (Managed Care)

## 2024-10-10 DIAGNOSIS — N3000 Acute cystitis without hematuria: Principal | ICD-10-CM

## 2024-10-10 MED ORDER — NITROFURANTOIN MONOHYDRATE/MACROCRYSTALS 100 MG CAPSULE
ORAL_CAPSULE | Freq: Two times a day (BID) | ORAL | 0 refills | 7.00000 days | Status: CP
Start: 2024-10-10 — End: 2024-10-17

## 2024-10-12 DIAGNOSIS — N3000 Acute cystitis without hematuria: Principal | ICD-10-CM

## 2024-10-13 DIAGNOSIS — C9200 Acute myeloblastic leukemia, not having achieved remission: Principal | ICD-10-CM

## 2024-10-14 DIAGNOSIS — L814 Other melanin hyperpigmentation: Principal | ICD-10-CM

## 2024-10-14 DIAGNOSIS — D485 Neoplasm of uncertain behavior of skin: Principal | ICD-10-CM

## 2024-10-14 DIAGNOSIS — D229 Melanocytic nevi, unspecified: Principal | ICD-10-CM

## 2024-10-14 DIAGNOSIS — D1801 Hemangioma of skin and subcutaneous tissue: Principal | ICD-10-CM

## 2024-10-14 DIAGNOSIS — L821 Other seborrheic keratosis: Principal | ICD-10-CM

## 2024-10-16 DIAGNOSIS — C9200 Acute myeloblastic leukemia, not having achieved remission: Principal | ICD-10-CM

## 2024-10-17 DIAGNOSIS — C44311 Basal cell carcinoma of skin of nose: Principal | ICD-10-CM

## 2024-10-17 DIAGNOSIS — C44321 Squamous cell carcinoma of skin of nose: Principal | ICD-10-CM

## 2024-10-17 DIAGNOSIS — D0439 Carcinoma in situ of skin of other parts of face: Principal | ICD-10-CM

## 2024-11-06 MED ORDER — ALFUZOSIN ER 10 MG TABLET,EXTENDED RELEASE 24 HR
ORAL_TABLET | Freq: Every day | ORAL | 0.00000 days
Start: 2024-11-06 — End: ?

## 2024-11-11 MED ORDER — ALFUZOSIN ER 10 MG TABLET,EXTENDED RELEASE 24 HR
ORAL_TABLET | Freq: Every day | ORAL | 0 refills | 90.00000 days | Status: CP
Start: 2024-11-11 — End: ?

## 2024-11-17 DIAGNOSIS — C9200 Acute myeloblastic leukemia, not having achieved remission: Principal | ICD-10-CM

## 2024-11-18 MED ORDER — ALFUZOSIN ER 10 MG TABLET,EXTENDED RELEASE 24 HR
ORAL_TABLET | Freq: Every day | ORAL | 90.00000 days
Start: 2024-11-18 — End: ?

## 2024-11-19 DIAGNOSIS — C9200 Acute myeloblastic leukemia, not having achieved remission: Principal | ICD-10-CM

## 2024-11-19 MED ORDER — CHOLECALCIFEROL (VITAMIN D3) 25 MCG (1,000 UNIT) TABLET
ORAL_TABLET | Freq: Every day | ORAL | 11 refills | 30.00000 days | Status: CP
Start: 2024-11-19 — End: 2025-11-19

## 2024-11-24 DIAGNOSIS — D2361 Other benign neoplasm of skin of right upper limb, including shoulder: Principal | ICD-10-CM

## 2024-11-24 DIAGNOSIS — C9200 Acute myeloblastic leukemia, not having achieved remission: Principal | ICD-10-CM

## 2024-11-28 DIAGNOSIS — C9200 Acute myeloblastic leukemia, not having achieved remission: Principal | ICD-10-CM

## 2024-11-28 MED ORDER — ALFUZOSIN ER 10 MG TABLET,EXTENDED RELEASE 24 HR
ORAL_TABLET | Freq: Every day | ORAL | 0 refills | 0.00000 days
Start: 2024-11-28 — End: ?

## 2024-12-01 DIAGNOSIS — C9200 Acute myeloblastic leukemia, not having achieved remission: Principal | ICD-10-CM

## 2024-12-01 MED ORDER — VALACYCLOVIR 500 MG TABLET
ORAL_TABLET | Freq: Two times a day (BID) | ORAL | 11 refills | 30.00000 days | Status: CP
Start: 2024-12-01 — End: ?

## 2024-12-01 MED ORDER — ALFUZOSIN ER 10 MG TABLET,EXTENDED RELEASE 24 HR
ORAL_TABLET | Freq: Every day | ORAL | 0 refills | 90.00000 days | Status: CP
Start: 2024-12-01 — End: ?

## 2024-12-02 ENCOUNTER — Ambulatory Visit
Admit: 2024-12-02 | Discharge: 2024-12-02 | Payer: Medicare (Managed Care) | Attending: Internal Medicine | Primary: Internal Medicine

## 2024-12-02 ENCOUNTER — Other Ambulatory Visit: Admit: 2024-12-02 | Discharge: 2024-12-02 | Payer: Medicare (Managed Care)

## 2024-12-02 ENCOUNTER — Encounter: Admit: 2024-12-02 | Discharge: 2024-12-02 | Payer: Medicare (Managed Care)

## 2024-12-02 DIAGNOSIS — C9200 Acute myeloblastic leukemia, not having achieved remission: Principal | ICD-10-CM

## 2024-12-02 DIAGNOSIS — R799 Abnormal finding of blood chemistry, unspecified: Principal | ICD-10-CM

## 2024-12-02 DIAGNOSIS — C9000 Multiple myeloma not having achieved remission: Principal | ICD-10-CM

## 2024-12-02 DIAGNOSIS — Z9484 Stem cells transplant status: Principal | ICD-10-CM

## 2024-12-02 DIAGNOSIS — R351 Nocturia: Principal | ICD-10-CM

## 2024-12-02 DIAGNOSIS — R350 Frequency of micturition: Principal | ICD-10-CM

## 2024-12-02 DIAGNOSIS — N401 Enlarged prostate with lower urinary tract symptoms: Principal | ICD-10-CM

## 2024-12-02 DIAGNOSIS — R339 Retention of urine, unspecified: Principal | ICD-10-CM

## 2024-12-03 ENCOUNTER — Encounter: Admit: 2024-12-03 | Discharge: 2024-12-03 | Payer: Medicare (Managed Care)

## 2024-12-03 DIAGNOSIS — C9200 Acute myeloblastic leukemia, not having achieved remission: Principal | ICD-10-CM

## 2024-12-03 DIAGNOSIS — N3000 Acute cystitis without hematuria: Principal | ICD-10-CM

## 2024-12-03 MED ORDER — NITROFURANTOIN MONOHYDRATE/MACROCRYSTALS 100 MG CAPSULE
ORAL_CAPSULE | Freq: Two times a day (BID) | ORAL | 0 refills | 5.00000 days | Status: CP
Start: 2024-12-03 — End: 2024-12-08

## 2024-12-04 DIAGNOSIS — C9200 Acute myeloblastic leukemia, not having achieved remission: Principal | ICD-10-CM

## 2024-12-09 DIAGNOSIS — C9200 Acute myeloblastic leukemia, not having achieved remission: Principal | ICD-10-CM

## 2024-12-10 DIAGNOSIS — C9200 Acute myeloblastic leukemia, not having achieved remission: Principal | ICD-10-CM

## 2024-12-11 DIAGNOSIS — C9200 Acute myeloblastic leukemia, not having achieved remission: Principal | ICD-10-CM

## 2024-12-16 ENCOUNTER — Encounter: Admit: 2024-12-16 | Discharge: 2024-12-17 | Payer: Medicare (Managed Care)

## 2024-12-16 DIAGNOSIS — C9000 Multiple myeloma not having achieved remission: Principal | ICD-10-CM

## 2024-12-16 DIAGNOSIS — C9200 Acute myeloblastic leukemia, not having achieved remission: Principal | ICD-10-CM

## 2024-12-17 DIAGNOSIS — C9200 Acute myeloblastic leukemia, not having achieved remission: Principal | ICD-10-CM

## 2024-12-19 DIAGNOSIS — C9000 Multiple myeloma not having achieved remission: Secondary | ICD-10-CM

## 2024-12-19 DIAGNOSIS — C9200 Acute myeloblastic leukemia, not having achieved remission: Principal | ICD-10-CM

## 2024-12-20 DIAGNOSIS — C9200 Acute myeloblastic leukemia, not having achieved remission: Principal | ICD-10-CM

## 2024-12-25 ENCOUNTER — Encounter: Admit: 2024-12-25 | Discharge: 2024-12-25 | Payer: Medicare (Managed Care)

## 2024-12-25 DIAGNOSIS — R350 Frequency of micturition: Principal | ICD-10-CM

## 2024-12-25 DIAGNOSIS — C9200 Acute myeloblastic leukemia, not having achieved remission: Principal | ICD-10-CM

## 2024-12-25 MED ORDER — NITROFURANTOIN MONOHYDRATE/MACROCRYSTALS 100 MG CAPSULE
ORAL_CAPSULE | Freq: Two times a day (BID) | ORAL | 0 refills | 7.00000 days | Status: CP
Start: 2024-12-25 — End: 2025-01-01

## 2024-12-26 DIAGNOSIS — C9200 Acute myeloblastic leukemia, not having achieved remission: Principal | ICD-10-CM

## 2025-02-23 MED ORDER — ALFUZOSIN ER 10 MG TABLET,EXTENDED RELEASE 24 HR
ORAL_TABLET | Freq: Every day | ORAL | 3 refills | 90.00000 days | Status: CP
Start: 2025-02-23 — End: ?
